# Patient Record
Sex: Female | Born: 1950 | Race: White | Hispanic: No | Smoking: Former smoker
Health system: Southern US, Community
[De-identification: ages and names within clinical notes are randomized; demographics above are authoritative.]

## PROBLEM LIST (undated history)

## (undated) DIAGNOSIS — Z92241 Personal history of systemic steroid therapy: Secondary | ICD-10-CM

## (undated) DIAGNOSIS — I219 Acute myocardial infarction, unspecified: Secondary | ICD-10-CM

## (undated) DIAGNOSIS — F329 Major depressive disorder, single episode, unspecified: Secondary | ICD-10-CM

## (undated) DIAGNOSIS — F4310 Post-traumatic stress disorder, unspecified: Secondary | ICD-10-CM

## (undated) DIAGNOSIS — M4800 Spinal stenosis, site unspecified: Secondary | ICD-10-CM

## (undated) DIAGNOSIS — G8929 Other chronic pain: Secondary | ICD-10-CM

## (undated) DIAGNOSIS — N950 Postmenopausal bleeding: Secondary | ICD-10-CM

## (undated) DIAGNOSIS — E785 Hyperlipidemia, unspecified: Secondary | ICD-10-CM

## (undated) DIAGNOSIS — G4700 Insomnia, unspecified: Secondary | ICD-10-CM

## (undated) DIAGNOSIS — F419 Anxiety disorder, unspecified: Secondary | ICD-10-CM

## (undated) DIAGNOSIS — M8580 Other specified disorders of bone density and structure, unspecified site: Secondary | ICD-10-CM

## (undated) DIAGNOSIS — R112 Nausea with vomiting, unspecified: Secondary | ICD-10-CM

## (undated) DIAGNOSIS — M1990 Unspecified osteoarthritis, unspecified site: Secondary | ICD-10-CM

## (undated) DIAGNOSIS — M549 Dorsalgia, unspecified: Secondary | ICD-10-CM

## (undated) DIAGNOSIS — K219 Gastro-esophageal reflux disease without esophagitis: Secondary | ICD-10-CM

## (undated) DIAGNOSIS — M797 Fibromyalgia: Secondary | ICD-10-CM

## (undated) DIAGNOSIS — R569 Unspecified convulsions: Secondary | ICD-10-CM

## (undated) DIAGNOSIS — Z9889 Other specified postprocedural states: Secondary | ICD-10-CM

## (undated) DIAGNOSIS — I639 Cerebral infarction, unspecified: Secondary | ICD-10-CM

## (undated) DIAGNOSIS — Z8673 Personal history of transient ischemic attack (TIA), and cerebral infarction without residual deficits: Secondary | ICD-10-CM

## (undated) DIAGNOSIS — F10929 Alcohol use, unspecified with intoxication, unspecified: Secondary | ICD-10-CM

## (undated) DIAGNOSIS — F32A Depression, unspecified: Secondary | ICD-10-CM

## (undated) DIAGNOSIS — G43909 Migraine, unspecified, not intractable, without status migrainosus: Secondary | ICD-10-CM

## (undated) DIAGNOSIS — IMO0002 Reserved for concepts with insufficient information to code with codable children: Secondary | ICD-10-CM

## (undated) DIAGNOSIS — K7689 Other specified diseases of liver: Secondary | ICD-10-CM

## (undated) DIAGNOSIS — L0292 Furuncle, unspecified: Secondary | ICD-10-CM

## (undated) DIAGNOSIS — I10 Essential (primary) hypertension: Secondary | ICD-10-CM

## (undated) DIAGNOSIS — J189 Pneumonia, unspecified organism: Secondary | ICD-10-CM

## (undated) DIAGNOSIS — R9439 Abnormal result of other cardiovascular function study: Secondary | ICD-10-CM

## (undated) DIAGNOSIS — R252 Cramp and spasm: Secondary | ICD-10-CM

## (undated) DIAGNOSIS — J020 Streptococcal pharyngitis: Secondary | ICD-10-CM

## (undated) HISTORY — DX: Abnormal result of other cardiovascular function study: R94.39

## (undated) HISTORY — DX: Pneumonia, unspecified organism: J18.9

## (undated) HISTORY — DX: Anxiety disorder, unspecified: F41.9

## (undated) HISTORY — DX: Reserved for concepts with insufficient information to code with codable children: IMO0002

## (undated) HISTORY — PX: HAND SURGERY: SHX662

## (undated) HISTORY — DX: Hyperlipidemia, unspecified: E78.5

## (undated) HISTORY — DX: Alcohol use, unspecified with intoxication, unspecified: F10.929

## (undated) HISTORY — PX: FACIAL COSMETIC SURGERY: SHX629

## (undated) HISTORY — DX: Other specified diseases of liver: K76.89

## (undated) HISTORY — PX: WISDOM TOOTH EXTRACTION: SHX21

## (undated) HISTORY — PX: LIPOSUCTION: SHX10

## (undated) HISTORY — DX: Acute myocardial infarction, unspecified: I21.9

## (undated) HISTORY — PX: BREAST ENHANCEMENT SURGERY: SHX7

## (undated) HISTORY — DX: Other specified disorders of bone density and structure, unspecified site: M85.80

## (undated) HISTORY — PX: SQUAMOUS CELL CARCINOMA EXCISION: SHX2433

## (undated) HISTORY — DX: Cerebral infarction, unspecified: I63.9

## (undated) HISTORY — DX: Migraine, unspecified, not intractable, without status migrainosus: G43.909

## (undated) HISTORY — DX: Spinal stenosis, site unspecified: M48.00

## (undated) HISTORY — DX: Postmenopausal bleeding: N95.0

## (undated) HISTORY — PX: COLONOSCOPY: SHX174

## (undated) HISTORY — DX: Personal history of transient ischemic attack (TIA), and cerebral infarction without residual deficits: Z86.73

---

## 1998-06-08 ENCOUNTER — Other Ambulatory Visit: Admission: RE | Admit: 1998-06-08 | Discharge: 1998-06-08 | Payer: Self-pay | Admitting: Oral Surgery

## 1999-11-18 ENCOUNTER — Encounter: Payer: Self-pay | Admitting: Family Medicine

## 1999-11-18 ENCOUNTER — Ambulatory Visit (HOSPITAL_COMMUNITY): Admission: RE | Admit: 1999-11-18 | Discharge: 1999-11-18 | Payer: Self-pay | Admitting: Family Medicine

## 2003-02-12 ENCOUNTER — Other Ambulatory Visit: Admission: RE | Admit: 2003-02-12 | Discharge: 2003-02-12 | Payer: Self-pay | Admitting: Family Medicine

## 2010-08-02 ENCOUNTER — Inpatient Hospital Stay (HOSPITAL_COMMUNITY): Payer: BC Managed Care – PPO

## 2010-08-02 ENCOUNTER — Observation Stay (HOSPITAL_COMMUNITY)
Admission: EM | Admit: 2010-08-02 | Discharge: 2010-08-04 | DRG: 832 | Disposition: A | Discharge status: Home / Self Care | Payer: BC Managed Care – PPO | Attending: Internal Medicine | Admitting: Internal Medicine

## 2010-08-02 ENCOUNTER — Emergency Department (HOSPITAL_COMMUNITY): Payer: BC Managed Care – PPO

## 2010-08-02 DIAGNOSIS — R29898 Other symptoms and signs involving the musculoskeletal system: Secondary | ICD-10-CM | POA: Insufficient documentation

## 2010-08-02 DIAGNOSIS — Z79899 Other long term (current) drug therapy: Secondary | ICD-10-CM | POA: Insufficient documentation

## 2010-08-02 DIAGNOSIS — R209 Unspecified disturbances of skin sensation: Secondary | ICD-10-CM | POA: Insufficient documentation

## 2010-08-02 DIAGNOSIS — R5381 Other malaise: Secondary | ICD-10-CM | POA: Insufficient documentation

## 2010-08-02 DIAGNOSIS — E785 Hyperlipidemia, unspecified: Secondary | ICD-10-CM | POA: Insufficient documentation

## 2010-08-02 DIAGNOSIS — R110 Nausea: Secondary | ICD-10-CM | POA: Insufficient documentation

## 2010-08-02 DIAGNOSIS — F17200 Nicotine dependence, unspecified, uncomplicated: Secondary | ICD-10-CM | POA: Insufficient documentation

## 2010-08-02 DIAGNOSIS — I10 Essential (primary) hypertension: Secondary | ICD-10-CM | POA: Insufficient documentation

## 2010-08-02 DIAGNOSIS — G459 Transient cerebral ischemic attack, unspecified: Principal | ICD-10-CM | POA: Insufficient documentation

## 2010-08-02 LAB — URINALYSIS, ROUTINE W REFLEX MICROSCOPIC
Bilirubin Urine: NEGATIVE
Glucose, UA: NEGATIVE mg/dL
Hgb urine dipstick: NEGATIVE
Ketones, ur: NEGATIVE mg/dL
Leukocytes, UA: NEGATIVE
Nitrite: NEGATIVE
Protein, ur: NEGATIVE mg/dL
Specific Gravity, Urine: 1.015 (ref 1.005–1.030)
Urobilinogen, UA: 0.2 mg/dL (ref 0.0–1.0)
pH: 6.5 (ref 5.0–8.0)

## 2010-08-02 LAB — APTT: aPTT: 30 seconds (ref 24–37)

## 2010-08-02 LAB — CBC
HCT: 38.2 % (ref 36.0–46.0)
MCH: 33 pg (ref 26.0–34.0)
MCHC: 35.2 g/dL (ref 30.0–36.0)
MCHC: 35.1 g/dL (ref 30.0–36.0)
MCV: 93.2 fL (ref 78.0–100.0)
Platelets: 246 10*3/uL (ref 150–400)
Platelets: 264 10*3/uL (ref 150–400)
RBC: 4.37 MIL/uL (ref 3.87–5.11)
RBC: 4.39 MIL/uL (ref 3.87–5.11)
RDW: 12.6 % (ref 11.5–15.5)
RDW: 12.5 % (ref 11.5–15.5)
WBC: 12.3 10*3/uL — ABNORMAL HIGH (ref 4.0–10.5)
WBC: 12.9 10*3/uL — ABNORMAL HIGH (ref 4.0–10.5)

## 2010-08-02 LAB — POCT I-STAT, CHEM 8
BUN: 15 mg/dL (ref 6–23)
Calcium, Ion: 1.2 mmol/L (ref 1.12–1.32)
Hemoglobin: 15.3 g/dL — ABNORMAL HIGH (ref 12.0–15.0)
TCO2: 25 mmol/L (ref 0–100)

## 2010-08-02 LAB — COMPREHENSIVE METABOLIC PANEL
ALT: 17 U/L (ref 0–35)
Alkaline Phosphatase: 57 U/L (ref 39–117)
CO2: 24 mEq/L (ref 19–32)
GFR calc Af Amer: >60 mL/min (ref >60)
GFR calc non Af Amer: >60 mL/min (ref >60)
Glucose, Bld: 93 mg/dL (ref 70–99)
Potassium: 4.2 mEq/L (ref 3.5–5.1)
Sodium: 136 mEq/L (ref 135–145)
Total Bilirubin: 0.2 mg/dL — ABNORMAL LOW (ref 0.3–1.2)

## 2010-08-02 LAB — DIFFERENTIAL
Basophils Absolute: 0.1 10*3/uL (ref 0.0–0.1)
Basophils Absolute: 0.1 10*3/uL (ref 0.0–0.1)
Basophils Relative: 1 % (ref 0–1)
Basophils Relative: 1 % (ref 0–1)
Eosinophils Absolute: 0.1 10*3/uL (ref 0.0–0.7)
Eosinophils Absolute: 0.2 10*3/uL (ref 0.0–0.7)
Eosinophils Relative: 1 % (ref 0–5)
Eosinophils Relative: 1 % (ref 0–5)
Lymphocytes Relative: 23 % (ref 12–46)
Lymphocytes Relative: 31 % (ref 12–46)
Lymphs Abs: 2.8 10*3/uL (ref 0.7–4.0)
Lymphs Abs: 4 10*3/uL (ref 0.7–4.0)
Monocytes Absolute: 0.8 10*3/uL (ref 0.1–1.0)
Monocytes Absolute: 0.8 10*3/uL (ref 0.1–1.0)
Monocytes Absolute: 0.7 10*3/uL (ref 0.1–1.0)
Monocytes Relative: 6 % (ref 3–12)
Monocytes Relative: 7 % (ref 3–12)
Neutro Abs: 8.4 10*3/uL — ABNORMAL HIGH (ref 1.7–7.7)
Neutrophils Relative %: 69 % (ref 43–77)
Neutrophils Relative %: 69 % (ref 43–77)

## 2010-08-02 LAB — GLUCOSE, CAPILLARY: Glucose-Capillary: 95 mg/dL (ref 70–99)

## 2010-08-02 LAB — BASIC METABOLIC PANEL
CO2: 26 mEq/L (ref 19–32)
Calcium: 9 mg/dL (ref 8.4–10.5)
Creatinine, Ser: 0.68 mg/dL (ref 0.50–1.10)
GFR calc Af Amer: >60 mL/min (ref >60)
GFR calc non Af Amer: >60 mL/min (ref >60)
Sodium: 136 mEq/L (ref 135–145)

## 2010-08-02 LAB — PROTIME-INR
INR: 0.91 (ref 0.00–1.49)
Prothrombin Time: 12.4 seconds (ref 11.6–15.2)
Prothrombin Time: 12.4 seconds (ref 11.6–15.2)

## 2010-08-03 ENCOUNTER — Inpatient Hospital Stay (HOSPITAL_COMMUNITY): Payer: BC Managed Care – PPO

## 2010-08-03 LAB — COMPREHENSIVE METABOLIC PANEL
ALT: 15 U/L (ref 0–35)
CO2: 25 mEq/L (ref 19–32)
Calcium: 8.8 mg/dL (ref 8.4–10.5)
GFR calc Af Amer: >60 mL/min (ref >60)
GFR calc non Af Amer: >60 mL/min (ref >60)
Glucose, Bld: 91 mg/dL (ref 70–99)
Sodium: 138 mEq/L (ref 135–145)

## 2010-08-03 LAB — LIPID PANEL: HDL: 51 mg/dL (ref >39)

## 2010-08-03 LAB — TSH: TSH: 6.409 u[IU]/mL — ABNORMAL HIGH (ref 0.350–4.500)

## 2010-08-03 NOTE — Consult Note (Signed)
NAME:  Vicki [name], Vicki [name] NO.:  [id number]  MEDICAL RECORD NO.:  [id number]  LOCATION:  3035                         FACILITY:  [facility]  PHYSICIAN:  Levie Heritage, MD       DATE OF BIRTH:  27-Feb-1950  DATE OF CONSULTATION:  08/02/2010 DATE OF DISCHARGE:                                CONSULTATION   REFERRING PHYSICIAN:  ER team.  REASON FOR CONSULTATION:  Code stroke.  CHIEF COMPLAINT:  Right arm and leg weakness and paresthesias.  HISTORY OF PRESENT ILLNESS:  This 60 year old woman with no other health issues had sudden onset of right arm and leg weakness along with feeling heavy sensation on the face as well as on the right arm.  She came to emergency department and a CT scan of the head was performed that did not show any acute intracranial abnormality.  By the time I examined her, majority of the signs and symptoms had been resolved and the NIH stroke scale was 1 for very mild paresthesias involving the right hand only.  No TPA was given as the signs and symptoms were resolved almost completely and the risks outweigh any benefits.  PAST MEDICAL HISTORY:  Hypertension.  ALLERGIES:  No known drug allergies.  MEDICATIONS:  She takes lisinopril 10 mg daily.  FAMILY HISTORY:  Father died of stroke.  No history to suggest early vasculopathy in the family.  SOCIAL HISTORY:  He works as a Paediatric nurse.  Denies alcohol abuse or illicit drug abuse.  REVIEW OF CLINICAL DATA:  I have seen her CT scan of the head performed today and it is completely unremarkable for any acute intracranial abnormality.  I have seen her labs and noted mild increase in WBC 12.4 with otherwise normal stroke panel, unremarkable Chem-8, BMP, and normal INR and unremarkable urinalysis.  PHYSICAL EXAMINATION:  VITAL SIGNS:  Blood pressure of 128/68 mmHg, pulse of 73 per minute. GENERAL:  Currently comfortably lying down in the bed.  NIHSS stroke scale is 1 for the mild right hand tingling.   Awake, oriented x3 in no acute distress. NEUROLOGIC:  Bilateral pupils reactive to light and accommodation. Moves eyes to all direction.  No field cut noted.  Face is symmetrical for sensation and strength testing.  Midline tongue without atrophy or fasciculation.  Palate elevates symmetrically bilaterally.  Shoulder shrug strength is 5/5.  Motor examination reveals no strength deficit. She is 5/5 all over on the strength testing.  Sensory examination suggests mild altered sensation involving the patchy areas use of the right face and right hand. Gait was deferred.  IMPRESSION:  A 60 year old woman with no other health issues with no significant comorbidities has sudden transient right arm and leg weakness along with paresthesias and majority of the symptoms has resolved already.   My impression is that her symptomatology is the result of central nervous system ischemic event.  More likely transient ischemic attack than a stroke, however, the complete possibility of a small stroke cannot be ruled out on the clinical examination alone. No t-PA was given because the symptoms are almost already resolved.  PLAN: 1. Please give the patient stat aspirin and keep on aspirin once  daily. 2. MRI and MRA of the head, Doppler carotid, cardiac echo, fasting     lipid panels. 3. Neurology will follow up with the patient and will proceed as the     case progresses.          ______________________________ Levie Heritage, MD     WS/MEDQ  D:  08/02/2010  T:  08/03/2010  Job:  [id number]  Electronically Signed by Levie Heritage MD on 08/03/2010 07:46:36 AM

## 2010-08-11 NOTE — Discharge Summary (Signed)
NAME:  Vicki [name], Vicki [name] NO.:  [id number]  MEDICAL RECORD NO.:  [id number]  LOCATION:  3035                         FACILITY:  [facility]  PHYSICIAN:  Jeoffrey Massed, MD    DATE OF BIRTH:  04-17-1950  DATE OF ADMISSION:  08/02/2010 DATE OF DISCHARGE:  08/04/2010                        DISCHARGE SUMMARY - REFERRING   PRIMARY CARE PRACTITIONER:  Dr. Merri Brunette.  PRIMARY DISCHARGE DIAGNOSES: 1. Transient ischemic attack. 2. Tobacco abuse. 3. Dyslipidemia. 4. Probable anxiety.  PAST MEDICAL HISTORY/SECONDARY DISCHARGE DIAGNOSES:  Include the following: 1. Hypertension. 2. Tobacco abuse.  CONSULTATIONS:  Neurology.  DISCHARGE MEDICATIONS: 1. Xanax 0.25 mg 1 tablet p.o. every 8 hours p.r.n. for anxiety. 2. Aspirin 325 mg 1 tablet p.o. daily. 3. Placebo or clopidogrel 75 mg p.o. daily with a meal as part of the     of the point study. 4. Simvastatin 20 mg 1 tablet p.o. daily. 5. Lisinopril 10 mg 1 tablet p.o. daily.  BRIEF HISTORY OF PRESENT ILLNESS:  The patient is a very pleasant 60- year-old female who presented with sudden onset of weakness and numbness in the right lower extremity right upper and lower extremity.  She was then admitted to the hospitalist service for further evaluation and treatment.  For further details, please see the physical history and physical that was dictated by Dr. Gerri Lins on admission.  PERTINENT RADIOLOGICAL STUDIES: 1. CT of the head done on August 02, 2010, was negative for any acute     intracranial abnormality. 2. MRI of the brain showed no definite pathologic findings.  I     questioned if there is a punctate acute or subacute infarction in     the inferior pons in the left.  This is so small it is difficult to     diagnose with certainty. 3. MRA of the head showed normal intracranial MR angiography of large     and medium-sized vessels. 4. The 2-D echocardiogram showed EF around 60% to 65%.  There was     increased  thickness of the atrial septum consistent with     lymphomatous hypertrophy. 5. Transcranial Doppler showed normal mean-flow velocities in the     anterior and posterior circulation. 6. Carotid Dopplers showed no significant carotid artery stenosis.  PERTINENT LABORATORY DATA: 1. LDL cholesterol was 141. 2. TSH was 6.409. 3. HbA1c was 5.8.  BRIEF HOSPITAL COURSE: 1. Probable TIA.  The patient presented with transient onset of     weakness in her right upper extremity and lower extremity.  Given     her risk profile, the patient was admitted to the hospitalist     service, underwent a TIA workup which included a MRI, MRA of her     brain including as well as a 2-D echocardiogram and carotid Doppler     ultrasound all of which are detailed as above.  Neurology followed     the patient and subsequently the patient consented to being     participant in the point trial.  The patient has been placed on     aspirin along with either a Plavix or placebo as she is in the     point trial.  She has also been placed on the statin and is now     stable to be discharged home.  She will follow up with Dr. Pearlean Brownie in     the next 1or 2 months and has been instructed to follow up with her     primary care practitioner in the next 1-2 weeks.  Please note that     weakness was transient and on discharge she does not have any focal     neurological deficits at all. 2. Dyslipidemia.  The patient has been started on simvastatin. 3. Probable subclinical hypothyroidism.  The patient will need further     workup but this can be done as an outpatient and we will defer this     to her primary care practitioner. 4. Tobacco abuse.  The patient has been counseled extensively by me     regarding risk for further continued tobacco abuse including     coronary artery disease, CVAs, lung cancer.  She understands and     claims she will quit from now on.  DISPOSITION:  The patient was considered stable to be  discharged home.  FOLLOWUP INSTRUCTIONS: 1. The patient will follow up with her primary care practitioner     within 1-2 weeks. 2. The patient will follow up Dr. Pearlean Brownie in the next 4-6 weeks.  Total time spent 25 minutes.     Jeoffrey Massed, MD     SG/MEDQ  D:  08/04/2010  T:  08/04/2010  Job:  [id number]  cc:   Dario Guardian, M.D. Pramod P. Pearlean Brownie, MD  Electronically Signed by Jeoffrey Massed  on 08/11/2010 12:11:45 PM

## 2010-10-05 NOTE — H&P (Signed)
NAME:  Vicki, [name] NO.:  [id number]  MEDICAL RECORD NO.:  [id number]  LOCATION:  3035                         FACILITY:  [facility]  PHYSICIAN:  Vicki Nolton, DO         DATE OF BIRTH:  [birth year]  DATE OF ADMISSION:  08/02/2010 DATE OF DISCHARGE:                             HISTORY & PHYSICAL   CHIEF COMPLAINT:  Right-sided weakness.  HISTORY OF PRESENT ILLNESS:  The patient is a 60 year old female who states that she was trying to cut her this morning when she had sudden onset of weakness and numbness in her right upper extremity and her right lower extremity.  She was holding a water bowl on her right hand and dropped it is and her right leg gave out on her.  She also had complete numbness of the right upper extremity on the right side of her face.  At this time, she denies any headache, confusion, dysarthria, aphasia, expressive or receptive or dizziness. She states that now 4 hours later her symptoms have almost completely resolved.  She is feeling much better.  PAST MEDICAL HISTORY:  Significant for hypertension and tobacco abuse.  PAST SURGICAL HISTORY:  None.  MEDICATIONS:  Includes: 1. Lisinopril, dose and frequency unknown. 2. Progesterone, dose and frequency unknown.  SOCIAL HISTORY:  The patient smokes about a five cigarettes a day.  She has been trying to quit; however, she does carry about a 45-year-pack history.  Occasional alcohol, no recreational drug use.  FAMILY HISTORY:  Strongly positive for stroke, in fact her father died of a stroke.  He had multiple TIAs and then a large catastrophic strokes.  REVIEW OF SYSTEMS:  CONSTITUTIONAL:  Negative for fever.  Negative for chills.  Negative for weakness.  Negative for fatigue.  CNS:  Positive for right upper and lower extremity weakness and facial numbness.  No syncope, no seizure, no headaches.  ENT:  No nasal congestion.  No throat pain.  No coryza.  CARDIOVASCULAR:  No chest pain,  no palpitations, no orthopnea.  RESPIRATORY:  No cough, no shortness breath, no wheezing.  GASTROINTESTINAL: Positive for nausea, which she states has been pretty much constant since January.  At this point, that she has gone up and purchase motion sickness pills for herself; however, she has not lost any weight.  GENITOURINARY:  No dysuria.  No hematuria. No urinary frequency.  RENAL:  No flank pain.  No swelling.  No pruritus.  SKIN:  No rashes or sores or lesions.  HEMATOLOGICAL:  No easy bruising, no purpura, no clots.  LYMPH:  No lymphadenopathy.  No painful lymph nodes or no specific lymph swelling.  PSYCHIATRIC:  No anxiety.  No depression.  No insomnia.  PHYSICAL EXAMINATION:  VITAL SIGNS:  O2 sat is 97 on 2 L, heart rate 66, blood pressure 118/75, respiratory rate 18. GENERAL:  The patient is awake, alert, and oriented x3, in no acute distress. HEENT:  Eyes:  Pupils equal, round, and react to light and accommodation.  External ocular movement bilaterally intact.  Sclerae nonicteric and noninjected.  Mouth:  Oral mucosa dry.  No lesions.  No sores.  Pharynx clear.  No erythema.  No exudate. NECK:  Negative for JVD.  Negative for thyromegaly.  Negative for lymphadenopathy. HEART:  Regular rate and rhythm at 80 beats per minute without murmur, ectopy, or gallops.  No lateral PMI.  No thrills. LUNGS:  Clear to auscultation bilaterally without wheezes, rales, or rhonchi.  No increased work of breathing.  No tactile fremitus. ABDOMEN:  Soft, nontender, nondistended.  Positive bowel sounds.  No hepatosplenomegaly.  No hernia is palpated. CARDIOVASCULAR:  Extremities are negative for cyanosis, clubbing, or edema.  The patient has greatly diminished dorsalis pedis and popliteal pulses bilaterally.  No carotid bruits bilaterally. NEUROLOGICAL:  Cranial nerves II through XII gross intact.  Motor and sensory intact.  Proprioception is intact. The patient has 3/5 muscle strength in her  right upper extremity, 5/5 in her left upper extremity, 4/5 in her left lower extremity, and 4/5 in her right lower extremity, and 5/5 in her left lower extremity.  LABORATORY DATA:  CT head shows no evidence of acute intracranial abnormality.  Chest x-ray demonstrated no acute cardiopulmonary abnormality.  Ionized calcium 1.20.  WBC 12.4, hemoglobin 53, hematocrit 45.0, platelets 246, neutrophils 69, PT 12.4, INR is 0.91, PTT 29. Glucose 95, sodium 138, potassium 4.7, chloride 105, CO2 24, BUN 15, creatinine 0.7, T-bili 0.2, alk phos 57, AST 18, ALT 17, total protein 6.5, albumin 3.5, calcium 8.8, CK is 43, MB is 1.5, troponin-I is less than 0.30.  EKG demonstrates normal sinus rhythm.  ASSESSMENT: 1. Transient ischemic attack with GSC of 1. 2. Hypertension. 3. Tobacco abuse. 4. Mild dehydration. 5. Use of exogenous hormones.  PLAN: 1. Admit to telemetry. 2. TIA workup including carotid Dopplers, echocardiogram, and followup     CT or MRI of the brain.  She will receive an aspirin every day.     She will be given DVT prophylaxis and GI prophylaxis. When we can     get confirmation on the correct dose of lisinopril.  We will     continue that.  We will stop the patient's progesterone.  I have     already counseled her for 7 minutes on tobacco cessation and it is     important to her future help well being.  I spent 50 minutes on this admission.          ______________________________ Vicki Lowes, DO     AS/MEDQ  D:  08/02/2010  T:  08/03/2010  Job:  [id number]  cc:   Dario Guardian, M.D.  Electronically Signed by Vicki Lowes DO on 10/05/2010 01:47:29 PM

## 2011-05-04 LAB — HM PAP SMEAR: HM Pap smear: NEGATIVE

## 2012-05-08 ENCOUNTER — Encounter: Payer: Self-pay | Admitting: Certified Nurse Midwife

## 2012-05-09 ENCOUNTER — Ambulatory Visit (INDEPENDENT_AMBULATORY_CARE_PROVIDER_SITE_OTHER): Payer: BC Managed Care – PPO | Admitting: Certified Nurse Midwife

## 2012-05-09 ENCOUNTER — Encounter: Payer: Self-pay | Admitting: Certified Nurse Midwife

## 2012-05-09 VITALS — BP 132/66 | HR 68 | Resp 16 | Ht 62.0 in | Wt 175.8 lb

## 2012-05-09 DIAGNOSIS — N950 Postmenopausal bleeding: Secondary | ICD-10-CM

## 2012-05-09 DIAGNOSIS — Z0000 Encounter for general adult medical examination without abnormal findings: Secondary | ICD-10-CM

## 2012-05-09 DIAGNOSIS — Z01419 Encounter for gynecological examination (general) (routine) without abnormal findings: Secondary | ICD-10-CM

## 2012-05-09 DIAGNOSIS — N907 Vulvar cyst: Secondary | ICD-10-CM

## 2012-05-09 DIAGNOSIS — N9089 Other specified noninflammatory disorders of vulva and perineum: Secondary | ICD-10-CM

## 2012-05-09 LAB — TSH: TSH: 4.175 u[IU]/mL (ref 0.350–4.500)

## 2012-05-09 LAB — POCT URINALYSIS DIPSTICK
Blood, UA: NEGATIVE
Leukocytes, UA: NEGATIVE
Nitrite, UA: NEGATIVE
Protein, UA: NEGATIVE
Urobilinogen, UA: NEGATIVE
pH, UA: 5

## 2012-05-09 NOTE — Patient Instructions (Signed)

## 2012-05-09 NOTE — Progress Notes (Signed)
62 y.o. [id number] Married Caucasian Female here for annual exam.  Menopausal stopped Prometrium 8/13, due to expense. Reports no vaginal bleeding until 2-14 which lasted 3 days.  No bleeding since that time. Denies vaginal dryness. Noted small bump on right vulva area,2 weeks ago, used epsom salt tub baths and area drained, now resolving.  No fever or chills or redness area when occurred.  Would information on Internal Medicine group, she is seeking new M.D. For her hypertension management and health care.  No other health issues.  No LMP recorded.          Sexually active: no  The current method of family planning is status post hysterectomy and post menopausal status.    Exercising: yes  Home exercise routine includes walking 2 hrs per week. Smoker:  no  Health Maintenance: Pap:  05/04/2011 MMG:  05/18/2011  Negative  Colonoscopy:  never BMD:   18 years ago  TDaP:   Labs:    reports that she has quit smoking. She does not have any smokeless tobacco history on file.  Past Medical History  Diagnosis Date  . Migraines     with aura  . 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 Hyperlipidemia     Past Surgical History  Procedure Laterality Date  . Hand surgery      nerve & tendon repair  . Breast enhancement surgery    . Facial cosmetic surgery      Current Outpatient Prescriptions  Medication Sig Dispense Refill  . aspirin 325 MG tablet Take 325 mg by mouth daily.      . B Complex Vitamins (B COMPLEX PO) Take by mouth.      . Cholecalciferol (VITAMIN D PO) Take by mouth daily.      . Cyanocobalamin (VITAMIN B 12 PO) Take by mouth daily.      Marland Kitchen losartan (COZAAR) 100 MG tablet Take 100 mg by mouth daily.      Marland Kitchen MAGNESIUM PO Take by mouth daily.      . Multiple Vitamins-Minerals (MULTIVITAMIN PO) Take by mouth daily.      . progesterone (PROMETRIUM) 200 MG capsule Take 200 mg by mouth daily.       No current facility-administered medications for this visit.    Family History   Problem Relation Age of Onset  . Stroke Mother   . Stroke Father   . Depression Father   . Hypertension Brother   . Heart disease Brother     stint    ROS:  Pertinent items are noted in HPI.  Otherwise, a comprehensive ROS was negative.  Exam:   There were no vitals taken for this visit.  Weight change: @WEIGHTCHANGE @ Height:      Ht Readings from Last 3 Encounters:  No data found for Ht    General appearance: alert, cooperative and appears stated age Head: Normocephalic, without obvious abnormality, atraumatic Neck: no adenopathy, supple, symmetrical, trachea midline and thyroid normal to inspection and palpation Lungs: clear to auscultation bilaterally Breasts: normal appearance, no masses or tenderness, No nipple discharge or bleeding Heart: regular rate and rhythm Abdomen: soft, non-tender; bowel sounds normal; no masses,  no organomegaly Extremities: extremities normal, atraumatic, no cyanosis or edema Skin: Skin color, texture, turgor normal. No rashes or lesions Lymph nodes: Cervical, supraclavicular, and axillary nodes normal. No abnormal inguinal nodes palpated Neurologic: Grossly normal   Pelvic: External genitalia:  no lesions, vulva cyst resolving, small soft area  on right lower vulva, non tender              Urethra:  normal appearing urethra with no masses, tenderness or lesions              Bartholins and Skenes: normal                 Vagina: normal appearing vagina with normal color and discharge, no lesions              Cervix: no lesions normal appearance              Pap taken: yes Bimanual Exam:  Uterus:  normal size, contour, position, consistency, mobility, non-tender              Adnexa: normal adnexa and no mass, fullness, tenderness               Rectovaginal: Confirms               Anus:  normal sphincter tone, no lesions  A: Well Woman with normal exam Menopausal no HRT Post menopausal bleeding Right vulvar cyst resolving Hypertension stable  medication seeking new provider, given referral list  P:Reviewed health and wellness pertinent to exam  mammogram yearly pap smear as indicated Discussed concern with Post menopausal bleeding and need for evaluation. Will consult and advise. Reviewed findings, continue epsom salt soaks until resolved, warning signs and symptoms given Stressed importance of keeping hypertension under control with history of mini stroke. Patient aware.  return annually or prn as above  An After Visit Summary was printed and given to the patient.  Pt to be notified re PUS and EMB Reviewed, TL

## 2012-05-10 LAB — IPS PAP TEST WITH REFLEX TO HPV

## 2012-06-27 ENCOUNTER — Telehealth: Payer: Self-pay | Admitting: Certified Nurse Midwife

## 2012-06-27 NOTE — Telephone Encounter (Signed)
Unable to leave a message due to no voicemail being set up. Called to try to schedule PUS.

## 2013-01-24 ENCOUNTER — Other Ambulatory Visit: Payer: Self-pay | Admitting: *Deleted

## 2013-01-28 ENCOUNTER — Other Ambulatory Visit: Payer: Self-pay | Admitting: Plastic Surgery

## 2013-04-04 ENCOUNTER — Ambulatory Visit (INDEPENDENT_AMBULATORY_CARE_PROVIDER_SITE_OTHER): Payer: BC Managed Care – PPO | Admitting: Pharmacist

## 2013-04-04 ENCOUNTER — Ambulatory Visit (INDEPENDENT_AMBULATORY_CARE_PROVIDER_SITE_OTHER): Payer: BC Managed Care – PPO | Admitting: Interventional Cardiology

## 2013-04-04 ENCOUNTER — Encounter: Payer: Self-pay | Admitting: Interventional Cardiology

## 2013-04-04 ENCOUNTER — Institutional Professional Consult (permissible substitution): Payer: BC Managed Care – PPO | Admitting: Pharmacist

## 2013-04-04 VITALS — BP 128/86 | HR 70

## 2013-04-04 VITALS — BP 110/80 | HR 76 | Ht 62.0 in | Wt 170.0 lb

## 2013-04-04 DIAGNOSIS — Z79899 Other long term (current) drug therapy: Secondary | ICD-10-CM

## 2013-04-04 DIAGNOSIS — E785 Hyperlipidemia, unspecified: Secondary | ICD-10-CM

## 2013-04-04 DIAGNOSIS — R079 Chest pain, unspecified: Secondary | ICD-10-CM

## 2013-04-04 MED ORDER — PRAVASTATIN SODIUM 10 MG PO TABS: 10.0000 mg | ORAL_TABLET | Freq: Every day | ORAL | Status: DC

## 2013-04-04 NOTE — Progress Notes (Signed)
Patient ID: Vicki [name], female   DOB: [birth year], 63 y.o.   MRN: [id number]     Patient ID: Vicki [name] MRN: [id number] DOB/AGE: 1950-05-15 63 y.o.   Referring Physician Dr. Carol Ada   Reason for Consultation hyperlipidemia  HPI: 63 y/o who has had hyperlipidemia.  She was referred to the lipid clinic.  Upon further questioning, she has had a squeezing feeling that is worse with exertion.  She uses a treadclimber and this comes on consistently after 5 minutes.  It is relieved with rest.  It is not severe.  She does not always stop exercising.  She feels some SHOB at times as well.  No palpitations or syncope.     Current Outpatient Prescriptions  Medication Sig Dispense Refill  . acamprosate (CAMPRAL) 333 MG tablet Take 333 mg by mouth 3 (three) times daily with meals.      Marland Kitchen aspirin 325 MG tablet Take 325 mg by mouth daily.      . Cholecalciferol (VITAMIN D PO) Take 5,000 mg by mouth 2 (two) times daily.       Javier Docker Oil 300 MG CAPS Take by mouth.      . losartan (COZAAR) 100 MG tablet Take 20 mg by mouth daily.       Marland Kitchen MAGNESIUM PO Take 133 mg by mouth daily.       . milk thistle 175 MG tablet Take 175 mg by mouth daily. 250mg       . Omega-3 Fatty Acids (FISH OIL) 1000 MG CAPS Take 1,000 mg by mouth daily.      . Omega-3 Fatty Acids (OMEGA 3 PO) Take by mouth. 90 mg      . selenium 50 MCG TABS tablet Take 50 mcg by mouth daily. 200 mg      . pravastatin (PRAVACHOL) 10 MG tablet Take 1 tablet (10 mg total) by mouth daily.  30 tablet  5   No current facility-administered medications for this visit.   Past Medical History  Diagnosis Date  . Migraines     with aura  . 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 Hyperlipidemia     Family History  Problem Relation Age of Onset  . Stroke Mother   . Stroke Father   . Depression Father   . Hypertension Brother   . Heart disease Brother     stint    History   Social History  . Marital Status: Married   Spouse Name: N/A    Number of Children: N/A  . Years of Education: N/A   Occupational History  . Not on file.   Social History Main Topics  . Smoking status: Former Research scientist (life sciences)  . Smokeless tobacco: Not on file  . Alcohol Use: No  . Drug Use: No  . Sexual Activity: Yes    Birth Control/ Protection: Post-menopausal   Other Topics Concern  . Not on file   Social History Narrative  . No narrative on file    Past Surgical History  Procedure Laterality Date  . Hand surgery      nerve & tendon repair  . Breast enhancement surgery    . Facial cosmetic surgery        (Not in a hospital admission)  Review of systems complete and found to be negative unless listed above .  No nausea, vomiting.  No fever chills, No focal weakness,  No palpitations.  Physical Exam: Filed Vitals:  04/04/13 1117  BP: 110/80  Pulse: 76    Weight: 170 lb (77.111 kg)  Physical exam:  [place]/AT EOMI No JVD, No carotid bruit RRR S1S2  No wheezing Soft. NT, nondistended No edema. No focal motor or sensory deficits Normal affect  Labs:   Lab Results  Component Value Date   WBC 12.9* 08/02/2010   HGB 13.4 08/02/2010   HCT 38.2 08/02/2010   MCV 93.2 08/02/2010   PLT 244 08/02/2010   No results found for this basename: NA, K, CL, CO2, BUN, CREATININE, CALCIUM, LABALBU, PROT, BILITOT, ALKPHOS, ALT, AST, GLUCOSE,  in the last 168 hours Lab Results  Component Value Date   CKTOTAL 40 08/02/2010   CKMB 1.5 08/02/2010   TROPONINI <0.30 08/02/2010    Lab Results  Component Value Date   CHOL 211* 08/03/2010   Lab Results  Component Value Date   HDL 51 08/03/2010   Lab Results  Component Value Date   LDLCALC 141* 08/03/2010   Lab Results  Component Value Date   TRIG 94 08/03/2010   Lab Results  Component Value Date   CHOLHDL 4.1 08/03/2010   No results found for this basename: LDLDIRECT       PJK:[id number]  ASSESSMENT AND PLAN:  1. Chest pain: Some typical features.  Will plan for stress echo to  evaluate for ischemia.  She should avoid activities that stimulate chest squeezing sensation until after the stress test. 2. Hyperlipidemia: Will enroll her in lipid clinic.  WIll consider trial of PCSK9 inhibitor. Signed:   Mina Marble, MD, [facility] 04/04/2013, 11:54 PM

## 2013-04-04 NOTE — Assessment & Plan Note (Addendum)
Given her h/o TIAs and family history of heart disease, would like to get LDL to at least < 100 mg/dL on statin if possible.  Given she has failed a few statins so far, will start her on pravastatin 10 mg qhs as this is extremely well tolerated.  Most brand name drugs aren't option at this time due to cost.  Will recheck lipid/liver in 3 months, and titrate pravastatin if needed, assuming she can tolerate.  If she has problems tolerating pravastatin, she would like to try and enroll into PCSK-9 inhibitor study.  She understands it is a SQ injection, and placebo controlled study.  Would rather not take Slo-niacin given its potential for hepatotoxicity.  If she needs niacin in future, would use IR niacin instead, but will stop niacin for now, and see how pravastatin does.  She is agreeable to this. Patient will f/u with Dr. Irish Lack for stress test in near future.

## 2013-04-04 NOTE — Patient Instructions (Signed)
Your physician has requested that you have a stress echocardiogram. For further information please visit [email or web address]. Please follow instruction sheet as given.   

## 2013-04-04 NOTE — Progress Notes (Signed)
Vicki [name] is pleasant 63 y.o. WF referred to lipid clinic by Dr. Carol Ada with [facility] [place].  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 She wasn't able to afford Zetia,Welchol, or Niaspan.  She started Slo-niacin a few days ago.  Vicki [name] hasn't seen a cardiologist before but given she mentions have chest tightness after 5 minutes on her tread climber, she was worked in to see Dr. Irish Lack today, who is going to get a stress test on her in the near future.  Vicki [name] is interested in getting herself on something to better control her cholesterol, however wants to make sure it is something she can tolerate.    RF:  TIA, HTN, age - LDL goal < 100 mg/dL at least Meds:  Slo-niacin 500 mg qhs for a week Intolerant:  Crestor (muscle and joint aches); Simvastatin (muscle aches, and some joint swelling) Can't afford:  Zetia, Welchol, Niaspan.  Family history:  Mother had multiple TIAs/CVAs and ultimately killed her.  5 uncles that have all had MIs.  She has a brother who has had an MI as well. Social history:  Former smoker.  Rarely drinks alcohol (1 glass of wine per week).  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  Diet:  Eats a low fat diet already.  She recently cut out carbs from her diet, and lost 15 labs over past 4 months. Exercise:  Uses her tread climber a few days of the week for 5-10 minutes.  She is on her feet all day as a Art gallery manager, so doesn't do other exercises.  Labs: 03/2013:  LDL 180, HDL 49, TC 268, TG 194 (not on lipid lowering therapy - niacin was started at this time)  Current Outpatient Prescriptions  Medication Sig Dispense Refill  . acamprosate (CAMPRAL) 333 MG tablet Take 333 mg by mouth 3 (three) times daily with meals.      Marland Kitchen aspirin 325 MG tablet Take 325 mg by mouth daily.      . Cholecalciferol (VITAMIN D PO) Take 5,000 mg by mouth 2 (two) times daily.       Javier Docker Oil 300 MG CAPS Take by mouth.      . losartan  (COZAAR) 100 MG tablet Take 20 mg by mouth daily.       Marland Kitchen MAGNESIUM PO Take 133 mg by mouth daily.       . milk thistle 175 MG tablet Take 175 mg by mouth daily. 250mg       . niacin (SLO-NIACIN) 500 MG tablet Take 500 mg by mouth at bedtime.      . Omega-3 Fatty Acids (FISH OIL) 1000 MG CAPS Take 1,000 mg by mouth daily.      . Omega-3 Fatty Acids (OMEGA 3 PO) Take by mouth. 90 mg      . pravastatin (PRAVACHOL) 10 MG tablet Take 1 tablet (10 mg total) by mouth daily.  30 tablet  5  . selenium 50 MCG TABS tablet Take 50 mcg by mouth daily. 200 mg       No current facility-administered medications for this visit.   Allergies  Allergen Reactions  . Codeine     headache  . Crestor [Rosuvastatin]     Muscle aches  . Simvastatin     Muscle aches   Family History  Problem Relation Age of Onset  . Stroke Mother   . Stroke Father   . Depression Father   . Hypertension  Brother   . Heart disease Brother     stint

## 2013-04-04 NOTE — Patient Instructions (Addendum)
1.  Established with Dr. Irish Lack and got EKG today 2.  Will set up for stress test for chest tightness following trend climber 3.  Stop slo-niacin. 4.  Start pravastatin 10 mg once daily in the evening time.  Call Ysidro Evert if muslce aches or side effects occur, and will try to enroll you into clinical trial we discussed. 5.  Recheck cholesterol and liver function here in 3 months (06/24/13 - fasting labs, lab opens at 7:30, show up anytime after this), and see Ysidro Evert the following day on 06/25/13 at 9:00 am to review.

## 2013-04-24 ENCOUNTER — Telehealth: Payer: Self-pay | Admitting: Pharmacist

## 2013-04-24 ENCOUNTER — Ambulatory Visit (HOSPITAL_BASED_OUTPATIENT_CLINIC_OR_DEPARTMENT_OTHER): Payer: BC Managed Care – PPO

## 2013-04-24 ENCOUNTER — Ambulatory Visit (HOSPITAL_COMMUNITY): Payer: BC Managed Care – PPO | Attending: Cardiovascular Disease | Admitting: Cardiology

## 2013-04-24 DIAGNOSIS — R079 Chest pain, unspecified: Secondary | ICD-10-CM | POA: Insufficient documentation

## 2013-04-24 DIAGNOSIS — R0989 Other specified symptoms and signs involving the circulatory and respiratory systems: Secondary | ICD-10-CM

## 2013-04-24 NOTE — Telephone Encounter (Signed)
Patient called stating she is having pain in her joints since starting pravastatin 10 mg last month.    Patient has h/o TIA x 2 and pending stress test.  May be able to enroll her into PCSK-9 inhibitor study.  Patient and I discussed this in the past.  We await results from stress test and call patient with options.

## 2013-04-24 NOTE — Progress Notes (Signed)
Stress echo performed.

## 2013-04-25 NOTE — Telephone Encounter (Signed)
Patient had to stop pravastatin due to joint aches.  Since she has failed 3 statins now, including pravastatin 10 mg qd,and has h/o multiple CVA/TIAs, she is eligible for SPIRE-2 clinical trial, and she wishes to proceed.  She can't afford Zetia, Welchol, or niaspan.  I've sent her information to [place] for them to contact and hopefully enroll her into study.  To Dr. Irish Lack as Juluis Rainier.

## 2013-05-15 ENCOUNTER — Encounter: Payer: Self-pay | Admitting: Certified Nurse Midwife

## 2013-05-15 ENCOUNTER — Ambulatory Visit (INDEPENDENT_AMBULATORY_CARE_PROVIDER_SITE_OTHER): Payer: BC Managed Care – PPO | Admitting: Certified Nurse Midwife

## 2013-05-15 VITALS — BP 110/72 | HR 70 | Resp 16 | Ht 61.75 in | Wt 169.0 lb

## 2013-05-15 DIAGNOSIS — Z01419 Encounter for gynecological examination (general) (routine) without abnormal findings: Secondary | ICD-10-CM

## 2013-05-15 DIAGNOSIS — Z23 Encounter for immunization: Secondary | ICD-10-CM

## 2013-05-15 DIAGNOSIS — Z0000 Encounter for general adult medical examination without abnormal findings: Secondary | ICD-10-CM

## 2013-05-15 NOTE — Patient Instructions (Signed)

## 2013-05-15 NOTE — Progress Notes (Signed)
63 y.o. [id number] Married Caucasian Fe here for annual exam.  Menopausal no HRT. Denies vaginal bleeding or vaginal dryness. Sees PCP for aex,and Hypertension management. Cardiology is managing cholesterol now and all labs. Happy with change. Social stress with daughters spouse terminal with brain cancer. Patient helping with grandsons as much as possible and supporting daughter. No other health issues today.  Patient's last menstrual period was 07/18/2011.          Sexually active: yes  The current method of family planning is post menopausal status.    Exercising: yes  cardio Smoker:  no  Health Maintenance: Pap:  05-09-12 neg MMG: 05-18-11 normal  Patient will schedule Colonoscopy:  Plans to schedule later in year declines IFOB. BMD:  17 yrs ago TDaP:  unsure Labs: none Self breast exam: done monthly   reports that she has quit smoking. She does not have any smokeless tobacco history on file. She reports that she does not drink alcohol or use illicit drugs.  Past Medical History  Diagnosis Date  . Migraines     with aura  . 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 Hyperlipidemia     Past Surgical History  Procedure Laterality Date  . Hand surgery      nerve & tendon repair  . Breast enhancement surgery    . Facial cosmetic surgery      Current Outpatient Prescriptions  Medication Sig Dispense Refill  . aspirin 325 MG tablet Take 325 mg by mouth daily.      . Cholecalciferol (VITAMIN D PO) Take 5,000 mg by mouth. 4 a day      . Krill Oil 300 MG CAPS Take by mouth.      . losartan (COZAAR) 100 MG tablet Take by mouth. Take 1/2 to 1 daily      . MAGNESIUM PO Take 133 mg by mouth daily.       . milk thistle 175 MG tablet Take 175 mg by mouth daily. 250mg       . Omega-3 Fatty Acids (FISH OIL) 1000 MG CAPS Take 1,000 mg by mouth daily.       No current facility-administered medications for this visit.    Family History  Problem Relation Age of Onset  . Stroke Mother   .  Stroke Father   . Depression Father   . Hypertension Brother   . Heart disease Brother     stint    ROS:  Pertinent items are noted in HPI.  Otherwise, a comprehensive ROS was negative.  Exam:   BP 110/72  Pulse 70  Resp 16  Ht 5' 1.75" (1.568 m)  Wt 169 lb (76.658 kg)  BMI 31.18 kg/m2  LMP 07/18/2011 Height: 5' 1.75" (156.8 cm)  Ht Readings from Last 3 Encounters:  05/15/13 5' 1.75" (1.568 m)  04/04/13 5\' 2"  (1.575 m)  05/09/12 5\' 2"  (1.575 m)    General appearance: alert, cooperative and appears stated age Head: Normocephalic, without obvious abnormality, atraumatic Neck: no adenopathy, supple, symmetrical, trachea midline and thyroid normal to inspection and palpation and non-palpable Lungs: clear to auscultation bilaterally Breasts: normal appearance, no masses or tenderness, No nipple retraction or dimpling, No nipple discharge or bleeding, No axillary or supraclavicular adenopathy Heart: regular rate and rhythm Abdomen: soft, non-tender; no masses,  no organomegaly Extremities: extremities normal, atraumatic, no cyanosis or edema Skin: Skin color, texture, turgor normal. No rashes or lesions Lymph nodes: Cervical, supraclavicular, and axillary nodes  normal. No abnormal inguinal nodes palpated Neurologic: Grossly normal   Pelvic: External genitalia:  no lesions              Urethra:  normal appearing urethra with no masses, tenderness or lesions              Bartholin's and Skene's: normal                 Vagina: normal appearing vagina with normal color and discharge, no lesions              Cervix: normal, non tender              Pap taken: yes Bimanual Exam:  Uterus:  normal size, contour, position, consistency, mobility, non-tender and anteverted              Adnexa: normal adnexa and no mass, fullness, tenderness               Rectovaginal: Confirms               Anus:  normal sphincter tone, no lesions  A:  Well Woman with normal exam  Menopausal no  HRT  Hypertension/cholesterol management with cardiology, PCP    Immunization update  Social stress  P:   Reviewed health and wellness pertinent to exam  Aware of need to evaluate if vaginal bleeding  Continue follow up with MD as indicated  Requests TDAP  Encouraged hospice support for family and friends as needed.  Pap smear taken today with HPVHR  Mammogram Yearly stressed  counseled on breast self exam, mammography screening, adequate intake of calcium and vitamin D, diet and exercise  return annually or prn  An After Visit Summary was printed and given to the patient.

## 2013-05-16 NOTE — Progress Notes (Signed)
Reviewed personally.  M. Suzanne Asaf Elmquist, MD.  

## 2013-05-17 ENCOUNTER — Telehealth: Payer: Self-pay | Admitting: *Deleted

## 2013-05-17 LAB — IPS PAP TEST WITH HPV

## 2013-05-17 NOTE — Telephone Encounter (Signed)
Patient just had AEX on 05-15-13.  There is an old PUS order from previous year AEX that needs to be reordered or canceled.

## 2013-06-06 NOTE — Telephone Encounter (Signed)
Have left message for patient to call  So we could discuss just endo biopsy per Dr. [name] Heck suggestion. No return call yet.

## 2013-06-21 ENCOUNTER — Telehealth: Payer: Self-pay | Admitting: Interventional Cardiology

## 2013-06-21 NOTE — Telephone Encounter (Signed)
Went over appts with pt.

## 2013-06-21 NOTE — Telephone Encounter (Signed)
New Prob   Pt has some questions regarding upcoming appointment next week. Please call.

## 2013-06-24 ENCOUNTER — Other Ambulatory Visit: Payer: BC Managed Care – PPO

## 2013-06-25 ENCOUNTER — Ambulatory Visit: Payer: BC Managed Care – PPO | Admitting: Pharmacist

## 2013-07-15 NOTE — Telephone Encounter (Signed)
Debbi, do you want any further follow-up on this ultrasound?

## 2013-07-15 NOTE — Telephone Encounter (Signed)
Will try patient again.

## 2013-07-18 ENCOUNTER — Telehealth: Payer: Self-pay | Admitting: Certified Nurse Midwife

## 2013-07-18 NOTE — Telephone Encounter (Signed)
Left message again on answering machine

## 2013-07-18 NOTE — Telephone Encounter (Signed)
LMTCB

## 2013-07-22 NOTE — Telephone Encounter (Signed)
Vicki [name],   At this point, if she does not respond,I think she needs a letter from you documenting why you recommended this and risks of non-compliance.  Is this something you would discharge her for? Just my thoughts but we have really worked to [employer] her in scheduling.

## 2013-08-01 NOTE — Telephone Encounter (Signed)
Multiple attempts to contact patient, including calls by French Ana CNM, no response from patient. Any further follow-up needed?

## 2013-08-02 NOTE — Telephone Encounter (Signed)
Had had no VB in over a year.  VB indicated on exam in 2014.  Nothing 2015.  Pap normal with neg HR HPV.  OK to close encounter.  No letter needed.

## 2013-08-21 ENCOUNTER — Other Ambulatory Visit: Payer: Self-pay | Admitting: *Deleted

## 2013-08-21 MED ORDER — AMBULATORY NON FORMULARY MEDICATION: 150.0000 mg | Status: DC

## 2013-10-21 ENCOUNTER — Telehealth: Payer: Self-pay | Admitting: Interventional Cardiology

## 2013-10-21 NOTE — Telephone Encounter (Addendum)
Spoke with pt and BP is now 170/113 with HR 113 bpm. Pt has only taken 1/2 tablet of losartan 100 mg today and then she will take another 1/2 tablet tonight. Bp usually runs 130's/80-90's with occasional diastolic in the lower 720'N.

## 2013-10-21 NOTE — Telephone Encounter (Addendum)
Per Dr. Acie Fredrickson pt should take her 1/2 tablet of losartan as scheduled tonight until further recommendation from Dr. Irish Lack. Pt notified.

## 2013-10-21 NOTE — Telephone Encounter (Signed)
New message      Want to know if Dr Irish Lack will refill her bp medication.  Her PCP, Carol Ada MD will not refill it past 15 pills.  It is losartan 100mg .  The pharmacy is [facility], [address] [place].  Pt said her bp is 202/110 and her heart rate is 102.  I never understood why her PCP wanted [place] to refill medication.

## 2013-10-22 NOTE — Telephone Encounter (Signed)
Continue to take extra half tab of losartan in the evening if BP > 150/90

## 2013-10-22 NOTE — Telephone Encounter (Signed)
lmtrc

## 2013-10-24 NOTE — Telephone Encounter (Signed)
Pt notified. Pts BP is doing better, but BP is still running 150's/90's with the 1/2 tablet BID. Pt does take BP reading before medication. She will start taking BP two hours after medication and call me if BP remains elevated.

## 2013-10-30 ENCOUNTER — Other Ambulatory Visit: Payer: BC Managed Care – PPO

## 2013-10-30 NOTE — Research (Signed)
Patient to [facility] for scheduled visit.  BP 113/93 Pulse  87 and BP 126/93 Pulse 89.  Taken in  Left arm, 3 minutes apart.  No complaints, stated feeling good.  Resupplied with study medication.

## 2013-11-18 ENCOUNTER — Encounter: Payer: Self-pay | Admitting: Certified Nurse Midwife

## 2013-12-24 DIAGNOSIS — Z0289 Encounter for other administrative examinations: Secondary | ICD-10-CM

## 2014-03-11 ENCOUNTER — Telehealth: Payer: Self-pay | Admitting: Certified Nurse Midwife

## 2014-03-11 NOTE — Telephone Encounter (Signed)
Left patient a message to call back to reschedule her AEX. °

## 2014-04-02 ENCOUNTER — Encounter: Payer: Self-pay | Admitting: *Deleted

## 2014-04-17 ENCOUNTER — Telehealth: Payer: Self-pay | Admitting: *Deleted

## 2014-04-17 DIAGNOSIS — E785 Hyperlipidemia, unspecified: Secondary | ICD-10-CM

## 2014-04-17 NOTE — Telephone Encounter (Signed)
Called pt and informed her that Dr. Irish Lack would like a lipid and liver test done. Made appt for 04/23/14. Pt verbalized understanding and was in agreement with this plan.

## 2014-04-17 NOTE — Telephone Encounter (Signed)
-----   Message from Jettie Booze, MD sent at 04/17/2014  3:40 PM EDT ----- She needs lipids and liver test done

## 2014-04-23 ENCOUNTER — Other Ambulatory Visit (INDEPENDENT_AMBULATORY_CARE_PROVIDER_SITE_OTHER): Payer: BLUE CROSS/BLUE SHIELD | Admitting: *Deleted

## 2014-04-23 DIAGNOSIS — E785 Hyperlipidemia, unspecified: Secondary | ICD-10-CM | POA: Diagnosis not present

## 2014-04-23 LAB — LIPID PANEL
CHOLESTEROL: 257 mg/dL — AB (ref 0–200)
HDL: 48.7 mg/dL (ref >39.00)
LDL CALC: 171 mg/dL — AB (ref 0–99)
NONHDL: 208.3
Total CHOL/HDL Ratio: 5
Triglycerides: 185 mg/dL — ABNORMAL HIGH (ref 0.0–149.0)
VLDL: 37 mg/dL (ref 0.0–40.0)

## 2014-04-23 LAB — HEPATIC FUNCTION PANEL
ALBUMIN: 3.8 g/dL (ref 3.5–5.2)
ALT: 14 U/L (ref 0–35)
AST: 18 U/L (ref 0–37)
Alkaline Phosphatase: 86 U/L (ref 39–117)
Bilirubin, Direct: 0.1 mg/dL (ref 0.0–0.3)
TOTAL PROTEIN: 6.8 g/dL (ref 6.0–8.3)
Total Bilirubin: 0.3 mg/dL (ref 0.2–1.2)

## 2014-05-06 ENCOUNTER — Encounter: Payer: Self-pay | Admitting: *Deleted

## 2014-05-06 NOTE — Progress Notes (Signed)
Subject re- consented to Main Informed Consent Version 5.0 and addendum Version 2.0.  IRB approval date [id number].

## 2014-05-18 DIAGNOSIS — F10929 Alcohol use, unspecified with intoxication, unspecified: Secondary | ICD-10-CM

## 2014-05-18 DIAGNOSIS — G459 Transient cerebral ischemic attack, unspecified: Secondary | ICD-10-CM | POA: Insufficient documentation

## 2014-05-18 DIAGNOSIS — I639 Cerebral infarction, unspecified: Secondary | ICD-10-CM

## 2014-05-18 HISTORY — DX: Alcohol use, unspecified with intoxication, unspecified: F10.929

## 2014-05-18 HISTORY — DX: Cerebral infarction, unspecified: I63.9

## 2014-05-18 HISTORY — DX: Transient cerebral ischemic attack, unspecified: G45.9

## 2014-05-20 ENCOUNTER — Ambulatory Visit: Payer: BC Managed Care – PPO | Admitting: Certified Nurse Midwife

## 2014-05-24 ENCOUNTER — Observation Stay (HOSPITAL_COMMUNITY)
Admission: EM | Admit: 2014-05-24 | Discharge: 2014-05-25 | Disposition: A | Discharge status: Home / Self Care | Payer: BLUE CROSS/BLUE SHIELD | Attending: Internal Medicine | Admitting: Internal Medicine

## 2014-05-24 ENCOUNTER — Emergency Department (HOSPITAL_COMMUNITY): Payer: BLUE CROSS/BLUE SHIELD

## 2014-05-24 ENCOUNTER — Encounter (HOSPITAL_COMMUNITY): Payer: Self-pay | Admitting: *Deleted

## 2014-05-24 DIAGNOSIS — R202 Paresthesia of skin: Secondary | ICD-10-CM

## 2014-05-24 DIAGNOSIS — F10129 Alcohol abuse with intoxication, unspecified: Secondary | ICD-10-CM | POA: Diagnosis present

## 2014-05-24 DIAGNOSIS — G459 Transient cerebral ischemic attack, unspecified: Principal | ICD-10-CM | POA: Insufficient documentation

## 2014-05-24 DIAGNOSIS — Z87891 Personal history of nicotine dependence: Secondary | ICD-10-CM | POA: Diagnosis not present

## 2014-05-24 DIAGNOSIS — Y906 Blood alcohol level of 120-199 mg/100 ml: Secondary | ICD-10-CM | POA: Diagnosis not present

## 2014-05-24 DIAGNOSIS — F329 Major depressive disorder, single episode, unspecified: Secondary | ICD-10-CM | POA: Diagnosis not present

## 2014-05-24 DIAGNOSIS — R200 Anesthesia of skin: Secondary | ICD-10-CM | POA: Diagnosis present

## 2014-05-24 DIAGNOSIS — E785 Hyperlipidemia, unspecified: Secondary | ICD-10-CM | POA: Diagnosis present

## 2014-05-24 DIAGNOSIS — R55 Syncope and collapse: Secondary | ICD-10-CM | POA: Diagnosis present

## 2014-05-24 DIAGNOSIS — I639 Cerebral infarction, unspecified: Secondary | ICD-10-CM | POA: Diagnosis not present

## 2014-05-24 DIAGNOSIS — Z888 Allergy status to other drugs, medicaments and biological substances status: Secondary | ICD-10-CM | POA: Insufficient documentation

## 2014-05-24 DIAGNOSIS — I10 Essential (primary) hypertension: Secondary | ICD-10-CM | POA: Insufficient documentation

## 2014-05-24 DIAGNOSIS — G43909 Migraine, unspecified, not intractable, without status migrainosus: Secondary | ICD-10-CM | POA: Insufficient documentation

## 2014-05-24 DIAGNOSIS — E871 Hypo-osmolality and hyponatremia: Secondary | ICD-10-CM | POA: Diagnosis present

## 2014-05-24 DIAGNOSIS — F32A Depression, unspecified: Secondary | ICD-10-CM | POA: Insufficient documentation

## 2014-05-24 DIAGNOSIS — M6289 Other specified disorders of muscle: Secondary | ICD-10-CM | POA: Diagnosis not present

## 2014-05-24 DIAGNOSIS — R531 Weakness: Secondary | ICD-10-CM | POA: Diagnosis present

## 2014-05-24 DIAGNOSIS — F1010 Alcohol abuse, uncomplicated: Secondary | ICD-10-CM | POA: Diagnosis present

## 2014-05-24 DIAGNOSIS — Z8673 Personal history of transient ischemic attack (TIA), and cerebral infarction without residual deficits: Secondary | ICD-10-CM | POA: Insufficient documentation

## 2014-05-24 HISTORY — DX: Hypo-osmolality and hyponatremia: E87.1

## 2014-05-24 HISTORY — DX: Syncope and collapse: R55

## 2014-05-24 HISTORY — DX: Alcohol abuse, uncomplicated: F10.10

## 2014-05-24 HISTORY — DX: Weakness: R53.1

## 2014-05-24 HISTORY — DX: Anesthesia of skin: R20.0

## 2014-05-24 HISTORY — DX: Alcohol abuse with intoxication, unspecified: F10.129

## 2014-05-24 LAB — DIFFERENTIAL
Basophils Absolute: 0.1 10*3/uL (ref 0.0–0.1)
Basophils Relative: 1 % (ref 0–1)
EOS PCT: 1 % (ref 0–5)
Eosinophils Absolute: 0.1 10*3/uL (ref 0.0–0.7)
LYMPHS PCT: 30 % (ref 12–46)
Lymphs Abs: 2.5 10*3/uL (ref 0.7–4.0)
MONOS PCT: 5 % (ref 3–12)
Monocytes Absolute: 0.4 10*3/uL (ref 0.1–1.0)
NEUTROS ABS: 5.3 10*3/uL (ref 1.7–7.7)
Neutrophils Relative %: 63 % (ref 43–77)

## 2014-05-24 LAB — CBC
HEMATOCRIT: 43.2 % (ref 36.0–46.0)
Hemoglobin: 15 g/dL (ref 12.0–15.0)
MCH: 31.3 pg (ref 26.0–34.0)
MCHC: 34.7 g/dL (ref 30.0–36.0)
MCV: 90 fL (ref 78.0–100.0)
Platelets: 284 10*3/uL (ref 150–400)
RBC: 4.8 MIL/uL (ref 3.87–5.11)
RDW: 12.9 % (ref 11.5–15.5)
WBC: 8.3 10*3/uL (ref 4.0–10.5)

## 2014-05-24 LAB — COMPREHENSIVE METABOLIC PANEL
ALBUMIN: 4 g/dL (ref 3.5–5.0)
ALT: 22 U/L (ref 14–54)
AST: 27 U/L (ref 15–41)
Alkaline Phosphatase: 74 U/L (ref 38–126)
Anion gap: 9 (ref 5–15)
BUN: 9 mg/dL (ref 6–20)
CALCIUM: 8.9 mg/dL (ref 8.9–10.3)
CO2: 19 mmol/L — ABNORMAL LOW (ref 22–32)
CREATININE: 0.71 mg/dL (ref 0.44–1.00)
Chloride: 98 mmol/L — ABNORMAL LOW (ref 101–111)
GFR calc Af Amer: >60 mL/min (ref >60)
Glucose, Bld: 113 mg/dL — ABNORMAL HIGH (ref 70–99)
Potassium: 4 mmol/L (ref 3.5–5.1)
SODIUM: 126 mmol/L — AB (ref 135–145)
TOTAL PROTEIN: 7 g/dL (ref 6.5–8.1)
Total Bilirubin: 0.6 mg/dL (ref 0.3–1.2)

## 2014-05-24 LAB — PROTIME-INR
INR: 0.95 (ref 0.00–1.49)
Prothrombin Time: 12.8 seconds (ref 11.6–15.2)

## 2014-05-24 LAB — CBG MONITORING, ED: GLUCOSE-CAPILLARY: 109 mg/dL — AB (ref 70–99)

## 2014-05-24 LAB — I-STAT CHEM 8, ED
BUN: 10 mg/dL (ref 6–20)
Calcium, Ion: 1.1 mmol/L — ABNORMAL LOW (ref 1.13–1.30)
Chloride: 98 mmol/L — ABNORMAL LOW (ref 101–111)
Creatinine, Ser: 0.9 mg/dL (ref 0.44–1.00)
Glucose, Bld: 116 mg/dL — ABNORMAL HIGH (ref 70–99)
HEMATOCRIT: 49 % — AB (ref 36.0–46.0)
HEMOGLOBIN: 16.7 g/dL — AB (ref 12.0–15.0)
POTASSIUM: 4.1 mmol/L (ref 3.5–5.1)
Sodium: 133 mmol/L — ABNORMAL LOW (ref 135–145)
TCO2: 16 mmol/L (ref 0–100)

## 2014-05-24 LAB — APTT: APTT: 31 s (ref 24–37)

## 2014-05-24 LAB — I-STAT TROPONIN, ED: Troponin i, poc: 0 ng/mL (ref 0.00–0.08)

## 2014-05-24 LAB — ETHANOL: Alcohol, Ethyl (B): 131 mg/dL — ABNORMAL HIGH (ref <5)

## 2014-05-24 MED ORDER — SODIUM CHLORIDE 0.9 % IJ SOLN: 3.0000 mL | Freq: Two times a day (BID) | INTRAMUSCULAR | Status: DC

## 2014-05-24 MED ORDER — ACETAMINOPHEN 650 MG RE SUPP: 650.0000 mg | Freq: Four times a day (QID) | RECTAL | Status: DC | PRN

## 2014-05-24 MED ORDER — OXYCODONE HCL 5 MG PO TABS: 5.0000 mg | ORAL_TABLET | ORAL | Status: DC | PRN

## 2014-05-24 MED ORDER — ONDANSETRON HCL 4 MG PO TABS
4.0000 mg | ORAL_TABLET | Freq: Four times a day (QID) | ORAL | Status: DC | PRN
  Administered 2014-05-24: 4 mg via ORAL
  Filled 2014-05-24: qty 1

## 2014-05-24 MED ORDER — LOSARTAN POTASSIUM 50 MG PO TABS
50.0000 mg | ORAL_TABLET | Freq: Two times a day (BID) | ORAL | Status: DC
  Administered 2014-05-24 – 2014-05-25 (×2): 50 mg via ORAL
  Filled 2014-05-24 (×2): qty 1

## 2014-05-24 MED ORDER — LORAZEPAM 1 MG PO TABS
0.0000 mg | ORAL_TABLET | Freq: Two times a day (BID) | ORAL | Status: DC
Start: 2014-05-26 — End: 2014-05-25

## 2014-05-24 MED ORDER — SODIUM CHLORIDE 0.9 % IV SOLN
INTRAVENOUS | Status: DC
  Administered 2014-05-25: 02:00:00 via INTRAVENOUS

## 2014-05-24 MED ORDER — STROKE: EARLY STAGES OF RECOVERY BOOK
Freq: Once | Status: AC
  Administered 2014-05-24: 22:00:00

## 2014-05-24 MED ORDER — VITAMIN B-1 100 MG PO TABS
100.0000 mg | ORAL_TABLET | Freq: Every day | ORAL | Status: DC
  Administered 2014-05-24 – 2014-05-25 (×2): 100 mg via ORAL
  Filled 2014-05-24 (×2): qty 1

## 2014-05-24 MED ORDER — HYDROMORPHONE HCL 1 MG/ML IJ SOLN: 0.5000 mg | INTRAMUSCULAR | Status: DC | PRN

## 2014-05-24 MED ORDER — ENOXAPARIN SODIUM 40 MG/0.4ML ~~LOC~~ SOLN
40.0000 mg | SUBCUTANEOUS | Status: DC
  Administered 2014-05-24: 40 mg via SUBCUTANEOUS
  Filled 2014-05-24: qty 0.4

## 2014-05-24 MED ORDER — ONDANSETRON HCL 4 MG/2ML IJ SOLN: 4.0000 mg | Freq: Four times a day (QID) | INTRAMUSCULAR | Status: DC | PRN

## 2014-05-24 MED ORDER — OMEGA-3-ACID ETHYL ESTERS 1 G PO CAPS
1.0000 g | ORAL_CAPSULE | Freq: Two times a day (BID) | ORAL | Status: DC
  Administered 2014-05-25: 1 g via ORAL
  Filled 2014-05-24: qty 1

## 2014-05-24 MED ORDER — LORAZEPAM 1 MG PO TABS: 0.0000 mg | ORAL_TABLET | Freq: Four times a day (QID) | ORAL | Status: DC

## 2014-05-24 MED ORDER — LORAZEPAM 1 MG PO TABS: 1.0000 mg | ORAL_TABLET | Freq: Four times a day (QID) | ORAL | Status: DC | PRN

## 2014-05-24 MED ORDER — THIAMINE HCL 100 MG/ML IJ SOLN
100.0000 mg | Freq: Every day | INTRAMUSCULAR | Status: DC
  Filled 2014-05-24 (×2): qty 2

## 2014-05-24 MED ORDER — LORAZEPAM 2 MG/ML IJ SOLN: 1.0000 mg | Freq: Four times a day (QID) | INTRAMUSCULAR | Status: DC | PRN

## 2014-05-24 MED ORDER — SERTRALINE HCL 50 MG PO TABS
50.0000 mg | ORAL_TABLET | Freq: Two times a day (BID) | ORAL | Status: DC
  Administered 2014-05-24 – 2014-05-25 (×2): 50 mg via ORAL
  Filled 2014-05-24 (×2): qty 1

## 2014-05-24 MED ORDER — SENNOSIDES-DOCUSATE SODIUM 8.6-50 MG PO TABS: 1.0000 | ORAL_TABLET | Freq: Every evening | ORAL | Status: DC | PRN

## 2014-05-24 MED ORDER — ADULT MULTIVITAMIN W/MINERALS CH
1.0000 | ORAL_TABLET | Freq: Every day | ORAL | Status: DC
  Administered 2014-05-24 – 2014-05-25 (×2): 1 via ORAL
  Filled 2014-05-24 (×2): qty 1

## 2014-05-24 MED ORDER — ACETAMINOPHEN 325 MG PO TABS: 650.0000 mg | ORAL_TABLET | Freq: Four times a day (QID) | ORAL | Status: DC | PRN

## 2014-05-24 MED ORDER — ALUM & MAG HYDROXIDE-SIMETH 200-200-20 MG/5ML PO SUSP: 30.0000 mL | Freq: Four times a day (QID) | ORAL | Status: DC | PRN

## 2014-05-24 MED ORDER — SODIUM CHLORIDE 0.9 % IV BOLUS (SEPSIS)
1000.0000 mL | Freq: Once | INTRAVENOUS | Status: AC
  Administered 2014-05-24: 1000 mL via INTRAVENOUS

## 2014-05-24 MED ORDER — FOLIC ACID 1 MG PO TABS
1.0000 mg | ORAL_TABLET | Freq: Every day | ORAL | Status: DC
  Administered 2014-05-24 – 2014-05-25 (×2): 1 mg via ORAL
  Filled 2014-05-24 (×2): qty 1

## 2014-05-24 NOTE — ED Provider Notes (Signed)
CSN: [id number]     Arrival date & time 05/24/14  1811 History   First MD Initiated Contact with Patient 05/24/14 1853     Chief Complaint  Patient presents with  . Stroke Symptoms     (Consider location/radiation/quality/duration/timing/severity/associated sxs/prior Treatment) Patient is a 64 y.o. female presenting with neurologic complaint. The history is provided by the patient and a relative.  Neurologic Problem This is a new problem. The current episode started today. The problem occurs constantly. The problem has been unchanged. Associated symptoms include numbness (RUE) and weakness (L-sided). Pertinent negatives include no abdominal pain, chest pain, chills, coughing, diaphoresis, fatigue, fever, headaches, nausea, neck pain, rash, urinary symptoms, vertigo, visual change or vomiting. Nothing aggravates the symptoms. She has tried nothing for the symptoms. The treatment provided no relief.    Past Medical History  Diagnosis Date  . Migraines     with aura  . 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 Hyperlipidemia    Past Surgical History  Procedure Laterality Date  . Hand surgery      nerve & tendon repair  . Breast enhancement surgery    . Facial cosmetic surgery     Family History  Problem Relation Age of Onset  . Stroke Mother   . Stroke Father   . Depression Father   . Hypertension Brother   . Heart disease Brother     stint   History  Substance Use Topics  . Smoking status: Former Research scientist (life sciences)  . Smokeless tobacco: Not on file  . Alcohol Use: 2.4 oz/week    4 Glasses of wine per week   OB History    Gravida Para Term Preterm AB TAB SAB Ectopic Multiple Living   3 3 3       3      Review of Systems  Constitutional: Negative for fever, chills, diaphoresis, appetite change and fatigue.  Eyes: Negative for photophobia and visual disturbance.  Respiratory: Negative for cough, chest tightness, shortness of breath and wheezing.   Cardiovascular: Negative for  chest pain, palpitations and leg swelling.  Gastrointestinal: Negative for nausea, vomiting, abdominal pain and diarrhea.  Musculoskeletal: Negative for back pain, neck pain and neck stiffness.  Skin: Negative for color change, pallor, rash and wound.  Neurological: Positive for weakness (L-sided) and numbness (RUE). Negative for dizziness, vertigo, seizures, syncope, facial asymmetry, speech difficulty, light-headedness and headaches.  All other systems reviewed and are negative.     Allergies  Codeine; Crestor; Other; and Simvastatin  Home Medications   Prior to Admission medications   Medication Sig Start Date End Date Taking? Authorizing Provider  AMBULATORY NON FORMULARY MEDICATION Inject 150 mg into the skin every 14 (fourteen) days. Medication Name: SPIRE study drug  bococizumab vs placebo Patient taking differently: Inject 150 mg into the skin every 14 (fourteen) days. Medication Name: SPIRE study drug  bococizumab vs placebo/ inject every other Wednesday 08/21/13  Yes Hillary Bow, MD  amLODipine (NORVASC) 2.5 MG tablet Take 2.5 mg by mouth daily.   Yes Historical Provider, MD  aspirin 325 MG tablet Take 325 mg by mouth at bedtime.    Yes Historical Provider, MD  Cholecalciferol 5000 UNITS capsule Take 10,000-15,000 Units by mouth 2 (two) times daily. Usually 3 tabs in am and 2 tabs in pm   Yes Historical Provider, MD  cyanocobalamin (,VITAMIN B-12,) 1000 MCG/ML injection Inject 1,000 mcg into the muscle every 30 (thirty) days. Last injection 05/23/2014  Yes Historical Provider, MD  dimenhyDRINATE (DRAMAMINE) 50 MG tablet Take 50 mg by mouth every 8 (eight) hours as needed for nausea.   Yes Historical Provider, MD  KRILL OIL PO Take 1 capsule by mouth daily.   Yes Historical Provider, MD  losartan (COZAAR) 100 MG tablet Take 50 mg by mouth 2 (two) times daily.    Yes Historical Provider, MD  MAGNESIUM PO Take 2 tablets by mouth daily.    Yes Historical Provider, MD  MILK THISTLE  PO Take 1 tablet by mouth daily.   Yes Historical Provider, MD  naproxen sodium (ANAPROX) 220 MG tablet Take 220 mg by mouth See admin instructions. Takes 1 tab twice daily, can take addt'l tab as needed for pain   Yes Historical Provider, MD  sertraline (ZOLOFT) 100 MG tablet Take 50 mg by mouth 2 (two) times daily.   Yes Historical Provider, MD  Omega-3 Fatty Acids (FISH OIL) 1000 MG CAPS Take 1 capsule (1,000 mg total) by mouth 2 (two) times daily. 05/25/14   Robbie Lis, MD   BP 132/78 mmHg  Pulse 76  Temp(Src) 97.3 F (36.3 C) (Oral)  Resp 20  Ht 5\' 2"  (1.575 m)  Wt 174 lb 6.4 oz (79.107 kg)  BMI 31.89 kg/m2  SpO2 98%  LMP 07/18/2011 Physical Exam  Constitutional: She is oriented to person, place, and time. She appears well-developed and well-nourished. No distress.  HENT:  Head: Normocephalic and atraumatic.  Mouth/Throat: Oropharynx is clear and moist.  Eyes: Conjunctivae and EOM are normal. Pupils are equal, round, and reactive to light.  Neck: Normal range of motion. Neck supple.  Cardiovascular: Normal rate, regular rhythm, normal heart sounds and intact distal pulses.  Exam reveals no gallop and no friction rub.   No murmur heard. Pulmonary/Chest: Effort normal and breath sounds normal. No respiratory distress. She has no wheezes. She has no rales. She exhibits no tenderness.  Abdominal: Soft. Bowel sounds are normal. She exhibits no distension. There is no tenderness. There is no rebound and no guarding.  Musculoskeletal: Normal range of motion. She exhibits no edema or tenderness.  Neurological: She is alert and oriented to person, place, and time. No cranial nerve deficit or sensory deficit. She displays a negative Romberg sign. Coordination normal. GCS eye subscore is 4. GCS verbal subscore is 5. GCS motor subscore is 6.  L-sided weakness, 4/5 strength compared to right.  No sensory deficits, facial droop, ataxia  Skin: Skin is warm and dry. No rash noted. She is not  diaphoretic. No erythema. No pallor.  Nursing note and vitals reviewed.   ED Course  Procedures (including critical care time) Labs Review Labs Reviewed  COMPREHENSIVE METABOLIC PANEL - Abnormal; Notable for the following:    Sodium 126 (*)    Chloride 98 (*)    CO2 19 (*)    Glucose, Bld 113 (*)    All other components within normal limits  ETHANOL - Abnormal; Notable for the following:    Alcohol, Ethyl (B) 131 (*)    All other components within normal limits  LIPID PANEL - Abnormal; Notable for the following:    Cholesterol 235 (*)    Triglycerides 352 (*)    HDL 38 (*)    VLDL 70 (*)    LDL Cholesterol 127 (*)    All other components within normal limits  I-STAT CHEM 8, ED - Abnormal; Notable for the following:    Sodium 133 (*)    Chloride 98 (*)  Glucose, Bld 116 (*)    Calcium, Ion 1.10 (*)    Hemoglobin 16.7 (*)    HCT 49.0 (*)    All other components within normal limits  CBG MONITORING, ED - Abnormal; Notable for the following:    Glucose-Capillary 109 (*)    All other components within normal limits  PROTIME-INR  APTT  CBC  DIFFERENTIAL  HEMOGLOBIN J0D  BASIC METABOLIC PANEL  CBC  OSMOLALITY, URINE  NA AND K (SODIUM & POTASSIUM), RAND UR  I-STAT TROPOININ, ED    Imaging Review No results found.   EKG Interpretation   Date/Time:  Saturday May 24 2014 19:00:08 EDT Ventricular Rate:  80 PR Interval:  171 QRS Duration: 86 QT Interval:  400 QTC Calculation: 461 R Axis:   81 Text Interpretation:  Sinus rhythm Borderline right axis deviation Low  voltage, precordial leads Nonspecific T abnormalities, lateral leads  Baseline wander in lead(s) V6 ED PHYSICIAN INTERPRETATION AVAILABLE IN  [facility] Confirmed by TEST, Record ([id number]) on 05/26/2014 6:40:08 AM      MDM   Final diagnoses:  Left-sided weakness    64 yo F with PMH of prior CVA, alcohol abuse, migraines, HPL, presenting with concern for L-sided weakness.  Pt was reportedly found  face down on ground by family.  Last known normal 4:30 PM, about 2.5 hours PTA.  Family stood pt up and she was weak on left side and unable to bear weight on this side, so brought to ED due to concern for stroke.  Pt also reports numbness in RUE.  She endorses EtOh use today.    On exam, pt intoxicated appearing, AFVSS.  Exam with L-sided weakness, 4/5 strength compared to right.  No sensory deficits, facial droop, ataxia.  Code stroke initiated.  Neuro at bedside.  Per Neuro, inconsistent exam in setting of alcohol intoxication.  Not tPA candidate.  CT head WNL.  Pt with hyponatremia to 126 on labs, likely due to EtOh abuse.  Will admit for further workup.  No other acute events during my care.  Discussed with attending Dr. Reather Converse.      Ellwood Dense, MD 05/30/14 1235  Elnora Morrison, MD 06/03/14 [phone]

## 2014-05-24 NOTE — ED Notes (Signed)
Dr. Jenkins at bedside. 

## 2014-05-24 NOTE — ED Notes (Signed)
CBG Taken = 109

## 2014-05-24 NOTE — ED Notes (Signed)
Transporting patient to new room assignment. 

## 2014-05-24 NOTE — Consult Note (Signed)
Referring Physician: Dr. Reather Converse    Chief Complaint: code stroke, left leg weakness, right arm numbness  HPI:                                                                                                                                         Vicki [name] is an 64 y.o. 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 According to her husband, she went over to see some neighbors and came back around 4 pm and was completely normal. The, couple of hours later her son came over and found her facedown in the yard. EMS was called but patient refused transport. Husband said that by that time she was little bit confused but without evidence of face-arms-legs weakness, or slurred speech. Patient indicated that she drank several glasses of wine but that she recalls her right face being numb and tingly since this morning. She was noted to have left leg weakness upon initial assessment in the ED and thus a ode stroke was called. NIHSS 2, but inconsistent exam. CT brain was personally reviewed and showed no acute abnormality. Takes aspirin daily.   ETOH level 131    Date last known well: 05/24/14 Time last known well: unknown tPA Given: no, late presentation NIHSS: 2 MRS: 0  Past Medical History  Diagnosis Date  . Migraines     with aura  . 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 Hyperlipidemia     Past Surgical History  Procedure Laterality Date  . Hand surgery      nerve & tendon repair  . Breast enhancement surgery    . Facial cosmetic surgery      Family History  Problem Relation Age of Onset  . Stroke Mother   . Stroke Father   . Depression Father   . Hypertension Brother   . Heart disease Brother     stint   Social History:  reports that she has quit smoking. She does not have any smokeless  tobacco history on file. She reports that she does not drink alcohol or use illicit drugs. Family history: no brain tumors, brain aneurysms, or epilepsy Allergies:  Allergies  Allergen Reactions  . Other Nausea And Vomiting    Anesthesia-severe vomiting  . Codeine Other (See Comments)    headache  . Crestor [Rosuvastatin] Other (See Comments)    Muscle aches  . Simvastatin Other (See Comments)    Muscle aches    Medications:  I have reviewed the patient's current medications.  ROS:                                                                                                                                       History obtained from the patient, husband, and chart review  General ROS: negative for - chills, fatigue, fever, night sweats,  or weight loss Psychological ROS: negative for - behavioral disorder, hallucinations, memory difficulties, or suicidal ideation Ophthalmic ROS: negative for - blurry vision, double vision, eye pain or loss of vision ENT ROS: negative for - epistaxis, nasal discharge, oral lesions, sore throat, tinnitus or vertigo Allergy and Immunology ROS: negative for - hives or itchy/watery eyes Hematological and Lymphatic ROS: negative for - bleeding problems, bruising or swollen lymph nodes Endocrine ROS: negative for - galactorrhea, hair pattern changes, polydipsia/polyuria or temperature intolerance Respiratory ROS: negative for - cough, hemoptysis, shortness of breath or wheezing Cardiovascular ROS: negative for - chest pain, dyspnea on exertion, edema or irregular heartbeat Gastrointestinal ROS: negative for - abdominal pain, diarrhea, hematemesis, nausea/vomiting or stool incontinence Genito-Urinary ROS: negative for - dysuria, hematuria, incontinence or urinary frequency/urgency Musculoskeletal ROS: negative for - joint swelling or  muscular weakness Neurological ROS: as noted in HPI Dermatological ROS: negative for rash and skin lesion changes   Physical exam: pleasant female in no apparent distress, saying that she is " very emotional right now". Head: normocephalic. Neck: supple, no bruits, no JVD. Cardiac: no murmurs. Lungs: clear. Abdomen: soft, no tender, no mass. Extremities: no edema, clubbing, or cyanosis. Blood pressure 121/66, pulse 85, resp. rate 13, last menstrual period 07/18/2011, SpO2 97 %. Skin: no rash Neurologic Examination:                                                                                                      General: Mental Status: Alert, oriented, thought content appropriate.  Speech fluent without evidence of aphasia.  Able to follow 3 step commands without difficulty. Cranial Nerves: II: Discs flat bilaterally; Visual fields grossly normal, pupils equal, round, reactive to light and accommodation III,IV, VI: ptosis not present, extra-ocular motions intact bilaterally V,VII: smile symmetric, facial light touch sensation normal bilaterally VIII: hearing normal bilaterally IX,X: uvula rises symmetrically XI: bilateral shoulder shrug XII: midline tongue extension without atrophy or fasciculations Motor: Significant for LLE drift Tone and bulk:normal tone throughout; no atrophy noted Sensory: Pinprick and light touch mildly diminished right face Deep Tendon Reflexes:  Right: Upper Extremity  Left: Upper extremity   biceps (C-5 to C-6) 2/4   biceps (C-5 to C-6) 2/4 tricep (C7) 2/4    triceps (C7) 2/4 Brachioradialis (C6) 2/4  Brachioradialis (C6) 2/4  Lower Extremity Lower Extremity  quadriceps (L-2 to L-4) 2/4   quadriceps (L-2 to L-4) 2/4 Achilles (S1) 2/4   Achilles (S1) 2/4  Plantars: Right: downgoing   Left: downgoing Cerebellar: normal finger-to-nose,  normal heel-to-shin test Gait:  No tested due to multiple leads   Results for orders placed or performed  during the hospital encounter of 05/24/14 (from the past 48 hour(s))  Ethanol     Status: Abnormal   Collection Time: 05/24/14  6:21 PM  Result Value Ref Range   Alcohol, Ethyl (B) 131 (H) <5 mg/dL    Comment:        LOWEST DETECTABLE LIMIT FOR SERUM ALCOHOL IS 11 mg/dL FOR MEDICAL PURPOSES ONLY   Protime-INR     Status: None   Collection Time: 05/24/14  6:26 PM  Result Value Ref Range   Prothrombin Time 12.8 11.6 - 15.2 seconds   INR 0.95 0.00 - 1.49  APTT     Status: None   Collection Time: 05/24/14  6:26 PM  Result Value Ref Range   aPTT 31 24 - 37 seconds  CBC     Status: None   Collection Time: 05/24/14  6:26 PM  Result Value Ref Range   WBC 8.3 4.0 - 10.5 K/uL   RBC 4.80 3.87 - 5.11 MIL/uL   Hemoglobin 15.0 12.0 - 15.0 g/dL   HCT 43.2 36.0 - 46.0 %   MCV 90.0 78.0 - 100.0 fL   MCH 31.3 26.0 - 34.0 pg   MCHC 34.7 30.0 - 36.0 g/dL   RDW 12.9 11.5 - 15.5 %   Platelets 284 150 - 400 K/uL  Differential     Status: None   Collection Time: 05/24/14  6:26 PM  Result Value Ref Range   Neutrophils Relative % 63 43 - 77 %   Neutro Abs 5.3 1.7 - 7.7 K/uL   Lymphocytes Relative 30 12 - 46 %   Lymphs Abs 2.5 0.7 - 4.0 K/uL   Monocytes Relative 5 3 - 12 %   Monocytes Absolute 0.4 0.1 - 1.0 K/uL   Eosinophils Relative 1 0 - 5 %   Eosinophils Absolute 0.1 0.0 - 0.7 K/uL   Basophils Relative 1 0 - 1 %   Basophils Absolute 0.1 0.0 - 0.1 K/uL  Comprehensive metabolic panel     Status: Abnormal   Collection Time: 05/24/14  6:26 PM  Result Value Ref Range   Sodium 126 (L) 135 - 145 mmol/L   Potassium 4.0 3.5 - 5.1 mmol/L   Chloride 98 (L) 101 - 111 mmol/L   CO2 19 (L) 22 - 32 mmol/L   Glucose, Bld 113 (H) 70 - 99 mg/dL   BUN 9 6 - 20 mg/dL   Creatinine, Ser 0.71 0.44 - 1.00 mg/dL   Calcium 8.9 8.9 - 10.3 mg/dL   Total Protein 7.0 6.5 - 8.1 g/dL   Albumin 4.0 3.5 - 5.0 g/dL   AST 27 15 - 41 U/L   ALT 22 14 - 54 U/L   Alkaline Phosphatase 74 38 - 126 U/L   Total Bilirubin  0.6 0.3 - 1.2 mg/dL   GFR calc non Af Amer >60 >60 mL/min   GFR calc Af Amer >60 >60 mL/min    Comment: (NOTE) The eGFR has  been calculated using the CKD EPI equation. This calculation has not been validated in all clinical situations. eGFR's persistently <60 mL/min signify possible Chronic Kidney Disease.    Anion gap 9 5 - 15  I-Stat Chem 8, ED  (not at [facility], [facility])     Status: Abnormal   Collection Time: 05/24/14  6:32 PM  Result Value Ref Range   Sodium 133 (L) 135 - 145 mmol/L   Potassium 4.1 3.5 - 5.1 mmol/L   Chloride 98 (L) 101 - 111 mmol/L   BUN 10 6 - 20 mg/dL   Creatinine, Ser 0.90 0.44 - 1.00 mg/dL   Glucose, Bld 116 (H) 70 - 99 mg/dL   Calcium, Ion 1.10 (L) 1.13 - 1.30 mmol/L   TCO2 16 0 - 100 mmol/L   Hemoglobin 16.7 (H) 12.0 - 15.0 g/dL   HCT 49.0 (H) 36.0 - 46.0 %  I-stat troponin, ED (not at [facility], [facility])     Status: None   Collection Time: 05/24/14  6:34 PM  Result Value Ref Range   Troponin i, poc 0.00 0.00 - 0.08 ng/mL   Comment 3            Comment: Due to the release kinetics of cTnI, a negative result within the first hours of the onset of symptoms does not rule out myocardial infarction with certainty. If myocardial infarction is still suspected, repeat the test at appropriate intervals.   CBG monitoring, ED     Status: Abnormal   Collection Time: 05/24/14  7:22 PM  Result Value Ref Range   Glucose-Capillary 109 (H) 70 - 99 mg/dL   Ct Head (brain) Wo Contrast  05/24/2014   CLINICAL DATA:  Stroke symptoms. Right-sided numbness and left-sided weakness. Fall in front yard. Bad headache yesterday. Code stroke.  EXAM: CT HEAD WITHOUT CONTRAST  TECHNIQUE: Contiguous axial images were obtained from the base of the skull through the vertex without intravenous contrast.  COMPARISON:  Brain MRI 08/04/2010.  Head CT 08/02/2010.  FINDINGS: Dilated perivascular space is again seen in the left basal ganglia. There is mild cerebral atrophy. Periventricular white-matter  hypodensities are nonspecific but compatible with minimal chronic small vessel ischemic disease. There is no evidence of acute cortical infarct, intracranial hemorrhage, mass, midline shift, or extra-axial fluid collection.  Orbits are unremarkable. Mastoid air cells and visualized paranasal sinuses are clear.  IMPRESSION: No evidence of acute intracranial abnormality.  These results were called by telephone at the time of interpretation on 05/24/2014 at 7:45 pm to Dr. Elnora Morrison , who verbally acknowledged these results.   Electronically Signed   By: Logan Bores   On: 05/24/2014 19:45    Assessment: 64 y.o. female with hyperlipidemia, previous ischemic infarct without residual deficits, complains of left leg weakness and right arm numbness. NIHSS 2 but very inconsistent exam.  CT brain without acute abnormality. Patient has been drinking, ETOH level 131, inconsistent neuro-exam with low NIHSS due to findings that doesn't fit anatomically, thus doubt an acute cerebrovascular insult and will not pursue further neurological intervention.  Stroke Risk Factors - HTN, stroke   Dorian Pod, MD [facility] [phone]  05/24/2014, 7:58 PM

## 2014-05-24 NOTE — H&P (Addendum)
[facility] Admission History and Physical       GINETTE BRADWAY IWL:[id number] DOB: 1950/01/26 DOA: 05/24/2014  Referring physician: EDP:   PCP: No PCP Per Patient  Specialists:   Chief Complaint: Simeon Craft on the Ground  HPI: Vicki [name] is a 64 y.o. female with a history of HTN, Hyperlipidemia, ? Migraine HAs, TIA in the past, and  Alcohol Abuse who was brought to the ED after being found unresponsive face-down in her front yard.   EMS was called.  When she became responsive she did not know how long she had been on the ground and did not remember what happened before she passed out.   She had last been seen at 4: 30 pm.  When her son came home he saw her on the ground in the front yard, and told his father who had been taking a nap.  The husband thinks that she was not down for more than 30  Minutes.   When she became alert she had difficulty standing due to left sided weakness.   She reports that she had been having right arm weakness and numbness and headaches off and on x 3 days.   She reports that she drank 15 ounces of Red Wine Today around 1 pm, and reports that she drinks 2-3x a week.   Her husband is at the bedside and states that it may be more.     When she arrived in the ED a Code Stroke was called, and she had a  CT scan of the head which was negative for acute findings.    She was seen by Neurologist Dr. Aram Beecham in the ED.    Her Sodium level was found to be 126.   And her initial ETOH level was 131.   She was found to have an NIHSS score of 2, and was outside of the window for TPA administration.      Review of Systems:  Constitutional: No Weight Loss, No Weight Gain, Night Sweats, Fevers, Chills, Dizziness, Light Headedness, Fatigue, or Generalized Weakness HEENT:    +Headaches, Difficulty Swallowing,Tooth/Dental Problems,Sore Throat,  No Sneezing, Rhinitis, Ear Ache, Nasal Congestion, or Post Nasal Drip,  Cardio-vascular:  No Chest pain, Orthopnea, PND,  Edema in Lower Extremities, Anasarca, Dizziness, Palpitations  Resp: No Dyspnea, No DOE, No Productive Cough, No Non-Productive Cough, No Hemoptysis, No Wheezing.    GI: No Heartburn, Indigestion, Abdominal Pain, Nausea, Vomiting, Diarrhea, Constipation, Hematemesis, Hematochezia, Melena, Change in Bowel Habits,  Loss of Appetite  GU: No Dysuria, No Change in Color of Urine, No Urgency or Urinary Frequency, No Flank pain.  Musculoskeletal: No Joint Pain or Swelling, No Decreased Range of Motion, No Back Pain.  Neurologic:  +Syncope, No Seizures, +Left sided Weakness, +Right ARM Paresthesias, Vision Disturbance or Loss, No Diplopia, No Vertigo, No Difficulty Walking,  Skin: No Rash or Lesions. Psych: No Change in Mood or Affect, No Depression or Anxiety, No Memory loss, No Confusion, or Hallucinations  Past Medical History  Diagnosis Date  . Migraines     with aura  . 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 Hyperlipidemia     Past Surgical History  Procedure Laterality Date  . Hand surgery      nerve & tendon repair  . Breast enhancement surgery    . Facial cosmetic surgery        Prior to Admission medications   Medication Sig Start Date End Date  Taking? Authorizing Provider  AMBULATORY NON FORMULARY MEDICATION Inject 150 mg into the skin every 14 (fourteen) days. Medication Name: SPIRE study drug  bococizumab vs placebo Patient taking differently: Inject 150 mg into the skin every 14 (fourteen) days. Medication Name: SPIRE study drug  bococizumab vs placebo/ inject every other Wednesday 08/21/13  Yes Hillary Bow, MD  amLODipine (NORVASC) 2.5 MG tablet Take 2.5 mg by mouth daily.   Yes Historical Provider, MD  aspirin 325 MG tablet Take 325 mg by mouth at bedtime.    Yes Historical Provider, MD  Cholecalciferol 5000 UNITS capsule Take 10,000-15,000 Units by mouth 2 (two) times daily. Usually 3 tabs in am and 2 tabs in pm   Yes Historical Provider, MD  cyanocobalamin (,VITAMIN  B-12,) 1000 MCG/ML injection Inject 1,000 mcg into the muscle every 30 (thirty) days. Last injection 05/23/2014   Yes Historical Provider, MD  dimenhyDRINATE (DRAMAMINE) 50 MG tablet Take 50 mg by mouth every 8 (eight) hours as needed for nausea.   Yes Historical Provider, MD  KRILL OIL PO Take 1 capsule by mouth daily.   Yes Historical Provider, MD  losartan (COZAAR) 100 MG tablet Take 50 mg by mouth 2 (two) times daily.    Yes Historical Provider, MD  MAGNESIUM PO Take 2 tablets by mouth daily.    Yes Historical Provider, MD  MILK THISTLE PO Take 1 tablet by mouth daily.   Yes Historical Provider, MD  naproxen sodium (ANAPROX) 220 MG tablet Take 220 mg by mouth See admin instructions. Takes 1 tab twice daily, can take addt'l tab as needed for pain   Yes Historical Provider, MD  Omega-3 Fatty Acids (FISH OIL PO) Take 1 capsule by mouth daily.   Yes Historical Provider, MD  sertraline (ZOLOFT) 100 MG tablet Take 50 mg by mouth 2 (two) times daily.   Yes Historical Provider, MD     Allergies  Allergen Reactions  . Codeine Other (See Comments)    headache  . Crestor [Rosuvastatin] Other (See Comments)    Muscle aches  . Other Nausea And Vomiting    Anesthesia-severe vomiting  . Simvastatin Other (See Comments)    Muscle aches    Social History:  reports that she has quit smoking. She does not have any smokeless tobacco history on file. She reports that she drinks about 2.4 oz of alcohol per week. She reports that she does not use illicit drugs.    Family History  Problem Relation Age of Onset  . Stroke Mother   . Stroke Father   . Depression Father   . Hypertension Brother   . Heart disease Brother     stint       Physical Exam:  GEN:  Pleasant Well Nourished and Well Developed  64 y.o. Caucasian female examined and in no acute distress; cooperative with exam Filed Vitals:   05/24/14 2052 05/24/14 2054 05/24/14 2058 05/24/14 2119  BP:   105/67 134/55  Pulse:   75 75  Temp:  98.2 F (36.8 C) 98.4 F (36.9 C)  97.7 F (36.5 C)  TempSrc:  Rectal  Oral  Resp:   23 20  Height:    5\' 2"  (1.575 m)  Weight:    79.107 kg (174 lb 6.4 oz)  SpO2:   99% 99%   Blood pressure 134/55, pulse 75, temperature 97.7 F (36.5 C), temperature source Oral, resp. rate 20, height 5\' 2"  (1.575 m), weight 79.107 kg (174 lb 6.4 oz), last menstrual  period 07/18/2011, SpO2 99 %. PSYCH: She is alert and oriented x4; does not appear anxious does not appear depressed; affect is normal HEENT: Normocephalic and Atraumatic, Mucous membranes pink; PERRLA; EOM intact; Fundi:  Benign;  No scleral icterus, Nares: Patent, Oropharynx: Clear, Fair Dentition,    Neck:  FROM, No Cervical Lymphadenopathy nor Thyromegaly or Carotid Bruit; No JVD; Breasts:: Not examined CHEST WALL: No tenderness CHEST: Normal respiration, clear to auscultation bilaterally HEART: Regular rate and rhythm; no murmurs rubs or gallops BACK: No kyphosis or scoliosis; No CVA tenderness ABDOMEN: Positive Bowel Sounds, Obese, Soft Non-Tender, No Rebound or Guarding; No Masses, No Organomegaly.    Rectal Exam: Not done EXTREMITIES: No Cyanosis, Clubbing, or Edema; No Ulcerations. Genitalia: not examined PULSES: 2+ and symmetric SKIN: Normal hydration no rash or ulceration CNS:  Alert and Oriented x 4, No Focal Deficits  Mental Status:  Alert, Oriented, Thought Content Appropriate. Speech Fluent without evidence of Aphasia.    Able to follow 3 step commands without difficulty.  In No obvious pain.    Cranial Nerves:  II: Discs flat bilaterally; Visual fields Intact, Pupils equal and reactive.     II,IV, VI: Extra-ocular motions intact bilaterally     V,VII: smile symmetric, facial light touch sensation normal bilaterally     VIII: hearing intact     IX,X: gag reflex present     XI: bilateral shoulder shrug     XII: midline tongue extension    Motor:  Right:  Upper extremity 5-/5     Left:  Upper extremity 5/5      Right:   Lower extremity 5/5    Left:  Lower extremity 5/5      Tone and Bulk:  normal tone throughout; no atrophy noted    Sensory:  Pinprick and light touch intact throughout, bilaterally    Deep Tendon Reflexes: 2+ and symmetric throughout    Plantars/ Babinski:  Right:normal Left: normal     Cerebellar:  Finger to nose without difficulty.    Gait: deferred   Vascular: pulses palpable throughout    Labs on Admission:  Basic Metabolic Panel:  Recent Labs Lab 05/24/14 1826 05/24/14 1832  NA 126* 133*  K 4.0 4.1  CL 98* 98*  CO2 19*  --   GLUCOSE 113* 116*  BUN 9 10  CREATININE 0.71 0.90  CALCIUM 8.9  --    Liver Function Tests:  Recent Labs Lab 05/24/14 1826  AST 27  ALT 22  ALKPHOS 74  BILITOT 0.6  PROT 7.0  ALBUMIN 4.0   No results for input(s): LIPASE, AMYLASE in the last 168 hours. No results for input(s): AMMONIA in the last 168 hours. CBC:  Recent Labs Lab 05/24/14 1826 05/24/14 1832  WBC 8.3  --   NEUTROABS 5.3  --   HGB 15.0 16.7*  HCT 43.2 49.0*  MCV 90.0  --   PLT 284  --    Cardiac Enzymes: No results for input(s): CKTOTAL, CKMB, CKMBINDEX, TROPONINI in the last 168 hours.  BNP (last 3 results) No results for input(s): BNP in the last 8760 hours.  ProBNP (last 3 results) No results for input(s): PROBNP in the last 8760 hours.  CBG:  Recent Labs Lab 05/24/14 1922  GLUCAP 109*    Radiological Exams on Admission: Ct Head (brain) Wo Contrast  05/24/2014   CLINICAL DATA:  Stroke symptoms. Right-sided numbness and left-sided weakness. Fall in front yard. Bad headache yesterday. Code stroke.  EXAM: CT HEAD  WITHOUT CONTRAST  TECHNIQUE: Contiguous axial images were obtained from the base of the skull through the vertex without intravenous contrast.  COMPARISON:  Brain MRI 08/04/2010.  Head CT 08/02/2010.  FINDINGS: Dilated perivascular space is again seen in the left basal ganglia. There is mild cerebral atrophy. Periventricular white-matter  hypodensities are nonspecific but compatible with minimal chronic small vessel ischemic disease. There is no evidence of acute cortical infarct, intracranial hemorrhage, mass, midline shift, or extra-axial fluid collection.  Orbits are unremarkable. Mastoid air cells and visualized paranasal sinuses are clear.  IMPRESSION: No evidence of acute intracranial abnormality.  These results were called by telephone at the time of interpretation on 05/24/2014 at 7:45 pm to Dr. Elnora Morrison , who verbally acknowledged these results.   Electronically Signed   By: Logan Bores   On: 05/24/2014 19:45     EKG: Independently reviewed. Normal Sinus Rhythm at 80 No Acute Changes   Assessment/Plan:   64 y.o. female with  Principal Problem:   1.    Hyponatremia- Na+ =126, Most likely due to ETOH Abuse, and Dehydration   IVFs with NSS   Monitor Na+ Trend   Send Urine Electrolytes and Osm to rule out SIADH   Active Problems:   2.    Left-sided weakness   CVA Protocol Initiated    MRI/MRA of Brain, Carotid US and 2 E=D ECHO ordered    Check Fasting Lipids and HbA1C in AM     3.    Right arm numbness- due to Migraine HA, or CVA   CVA Protocol Initiated     4.    Syncope and collapse- Due to CVA or Hyponatremia or Due to ETOH.      CVA Workup   Check Orthostatics     5.    Alcohol abuse/Alcohol abuse with intoxication   Monitor for Signs of Withdrawal   CIWA Protocol with PO Ativan PRN     6.    Hyperlipidemia   Checking Fasting Lipids in AM    Has Intolerance to Statins   Continue Omega 3 Fatty Acids     7.  Essential Hypertension   Discontinue Amlodipine = causes Swelling of BLEs    Change to a different Anti-Hypertensive   Continue Losartan Rx   Monitor BPs     8.    DVT Prophylaxis       Lovenox    Code Status:     FULL CODE       Family Communication:   Husband at Bedside       Disposition Plan:    Observation Status        Time spent:  [facility] Pager [phone]   If [place] Please Contact the Day Rounding Team MD for [facility]  If 7PM-7AM, Please Contact Night-Floor Coverage  [email or web address] Password [facility] 05/24/2014, 9:42 PM     ADDENDUM:   Patient was seen and examined on 05/24/2014

## 2014-05-24 NOTE — Code Documentation (Signed)
Vicki [name] is a 64yo wf presenting to the [facility] via pvt vehicle after being found on the ground by her husband and son.  Per her husband she went over to a neighbors and came back around 4pm and was walking and speaking normal at that time.  Her son came over around 1730 and found her facedown in the yard.  She was unable to walk back to the house and the family called EMS.  Upon their arrival she refused transport and her family assisted her back in the house.  Her husband was later able to convince her to come to [facility].  She admits to drinking several glasses on wine today and states that her Rt arm and entire face have been numb and tingling since early am.  The ED resident evaluated her upon arrival and found her to be weak on the LLE. A code stroke was called at that time.  Upon stroke team eval she was weak on LLE and sensory impaired on RUE.  NIH 2, inconsistent exam.

## 2014-05-24 NOTE — ED Notes (Signed)
Pt reports right arm numbness since she got up this am. Has pain to right arm when attempting to grip. Also states that left leg was weak earlier but leg weakness has improved. No facial droop noted.

## 2014-05-24 NOTE — ED Notes (Signed)
[facility] called @ 1910/code stroke activated.

## 2014-05-24 NOTE — Progress Notes (Signed)
Pt arrived to unit alert and oriented. Oriented to unit and room. Call bell at side. Will continue to monitor.

## 2014-05-25 ENCOUNTER — Observation Stay (HOSPITAL_COMMUNITY): Payer: BLUE CROSS/BLUE SHIELD

## 2014-05-25 DIAGNOSIS — F1010 Alcohol abuse, uncomplicated: Secondary | ICD-10-CM | POA: Diagnosis not present

## 2014-05-25 DIAGNOSIS — F329 Major depressive disorder, single episode, unspecified: Secondary | ICD-10-CM | POA: Diagnosis not present

## 2014-05-25 DIAGNOSIS — R55 Syncope and collapse: Secondary | ICD-10-CM | POA: Diagnosis not present

## 2014-05-25 DIAGNOSIS — I10 Essential (primary) hypertension: Secondary | ICD-10-CM | POA: Diagnosis not present

## 2014-05-25 DIAGNOSIS — F32A Depression, unspecified: Secondary | ICD-10-CM | POA: Insufficient documentation

## 2014-05-25 DIAGNOSIS — E871 Hypo-osmolality and hyponatremia: Secondary | ICD-10-CM | POA: Diagnosis not present

## 2014-05-25 LAB — BASIC METABOLIC PANEL
Anion gap: 8 (ref 5–15)
BUN: 7 mg/dL (ref 6–20)
CHLORIDE: 103 mmol/L (ref 101–111)
CO2: 24 mmol/L (ref 22–32)
CREATININE: 0.71 mg/dL (ref 0.44–1.00)
Calcium: 9.2 mg/dL (ref 8.9–10.3)
GFR calc non Af Amer: >60 mL/min (ref >60)
Glucose, Bld: 96 mg/dL (ref 70–99)
Potassium: 4.3 mmol/L (ref 3.5–5.1)
SODIUM: 135 mmol/L (ref 135–145)

## 2014-05-25 LAB — LIPID PANEL
CHOL/HDL RATIO: 6.2 ratio
Cholesterol: 235 mg/dL — ABNORMAL HIGH (ref 0–200)
HDL: 38 mg/dL — AB (ref >40)
LDL Cholesterol: 127 mg/dL — ABNORMAL HIGH (ref 0–99)
TRIGLYCERIDES: 352 mg/dL — AB (ref <150)
VLDL: 70 mg/dL — ABNORMAL HIGH (ref 0–40)

## 2014-05-25 LAB — CBC
HCT: 41.3 % (ref 36.0–46.0)
Hemoglobin: 14 g/dL (ref 12.0–15.0)
MCH: 30.8 pg (ref 26.0–34.0)
MCHC: 33.9 g/dL (ref 30.0–36.0)
MCV: 91 fL (ref 78.0–100.0)
PLATELETS: 255 10*3/uL (ref 150–400)
RBC: 4.54 MIL/uL (ref 3.87–5.11)
RDW: 13.1 % (ref 11.5–15.5)
WBC: 7.9 10*3/uL (ref 4.0–10.5)

## 2014-05-25 LAB — NA AND K (SODIUM & POTASSIUM), RAND UR
Potassium Urine: 54 mmol/L
SODIUM UR: 120 mmol/L

## 2014-05-25 LAB — OSMOLALITY, URINE: Osmolality, Ur: 421 mOsm/kg (ref 390–1090)

## 2014-05-25 MED ORDER — FISH OIL 1000 MG PO CAPS: 1000.0000 mg | ORAL_CAPSULE | Freq: Two times a day (BID) | ORAL | Status: DC

## 2014-05-25 NOTE — Evaluation (Signed)
Physical Therapy Evaluation Patient Details Name: Vicki [name] MRN: [id number] DOB: Dec 25, 1950 Today's Date: 05/25/2014   History of Present Illness  Vicki [name] is a 64 y.o. female with a history of HTN, Hyperlipidemia, ? Migraine HAs, TIA in the past, and Alcohol Abuse who was brought to the ED after being found unresponsive face-down in her front yard  Clinical Impression  Vicki [name] is moving well, reports all strength, mobility and balance as baseline. Pt however reports that she has had spinning, dizziness and nausea with onset November 2015 with daily occurrence lasting seconds. Supine head roll negative bilaterally without production of symptoms. Dix hallpike to left positive for symptoms of nausea but no spinning or nystagmus, negative to right. Treated with Epley maneuver left to right with immediate reproduction of nausea and spinning 2 seconds briefly with end position. Pt educated for vestibular therapy, repositioning sequence at home and symptoms. Pt would benefit from additional therapy to further evaluate and treat vestibular dysfunction with recommendation for OPPT if symptoms do not resolve.     Follow Up Recommendations Outpatient PT, vestibular    Equipment Recommendations  None recommended by PT    Recommendations for Other Services       Precautions / Restrictions Precautions Precautions: None      Mobility  Bed Mobility Overal bed mobility: Independent                Transfers Overall transfer level: Independent                  Ambulation/Gait Ambulation/Gait assistance: Independent Ambulation Distance (Feet): 50 Feet         General Gait Details: pt able to turn head, look around and walk without LOB, denied walking in hall  Stairs            Wheelchair Mobility    Modified Rankin (Stroke Patients Only)       Balance Overall balance assessment: No apparent balance deficits (not formally assessed)                                            Pertinent Vitals/Pain Pain Assessment: No/denies pain    Home Living Family/patient expects to be discharged to:: Private residence Living Arrangements: Spouse/significant other Available Help at Discharge: Family;Available 24 hours/day Type of Home: House Home Access: Stairs to enter   [employer] of Steps: 2 Home Layout: One level;Laundry or work area in [employer]: None      Prior Function Level of Independence: Independent         Comments: works as a Teacher, music: Right    Extremity/Trunk Assessment   Upper Extremity Assessment: Overall WFL for tasks assessed           Lower Extremity Assessment: Overall WFL for tasks assessed      Cervical / Trunk Assessment: Normal  Communication   Communication: No difficulties  Cognition Arousal/Alertness: Awake/alert Behavior During Therapy: WFL for tasks assessed/performed Overall Cognitive Status: Within Functional Limits for tasks assessed                      General Comments      Exercises        Assessment/Plan    PT Assessment Patient needs continued PT services  PT Diagnosis Other (comment) (vestibular  dysfunction)   PT Problem List Decreased activity tolerance;Other (comment) (vestibular dysfunction)  PT Treatment Interventions Functional mobility training;Therapeutic activities;Other (comment) (cannalith repositioning with vestibular treatment)   PT Goals (Current goals can be found in the Care Plan section) Acute Rehab PT Goals Patient Stated Goal: return to work PT Goal Formulation: With patient Time For Goal Achievement: 06/01/14 Potential to Achieve Goals: Good    Frequency Min 3X/week   Barriers to discharge        Co-evaluation               End of Session   Activity Tolerance: Patient tolerated treatment well Patient left: in chair;with call bell/phone within  reach      Functional Assessment Tool Used: clinical judgement Functional Limitation: Mobility: Walking and moving around Mobility: Walking and Moving Around Current Status (D7824): At least 1 percent but less than 20 percent impaired, limited or restricted Mobility: Walking and Moving Around Goal Status [phone]): 0 percent impaired, limited or restricted    Time: 1443-1540 PT Time Calculation (min) (ACUTE ONLY): 18 min   Charges:   PT Evaluation $Initial PT Evaluation Tier I: 1 Procedure     PT G Codes:   PT G-Codes **NOT FOR INPATIENT CLASS** Functional Assessment Tool Used: clinical judgement Functional Limitation: Mobility: Walking and moving around Mobility: Walking and Moving Around Current Status (G8676): At least 1 percent but less than 20 percent impaired, limited or restricted Mobility: Walking and Moving Around Goal Status [phone]): 0 percent impaired, limited or restricted    Melford Aase 05/25/2014, 12:10 PM Elwyn Reach, [place]

## 2014-05-25 NOTE — Discharge Instructions (Signed)
Transient Ischemic Attack  A transient ischemic attack (TIA) is a "warning stroke" that causes stroke-like symptoms. Unlike a stroke, a TIA does not cause permanent damage to the brain. The symptoms of a TIA can happen very fast and do not last long. It is important to know the symptoms of a TIA and what to do. This can help prevent a major stroke or death.  CAUSES   · A TIA is caused by a temporary blockage in an artery in the brain or neck (carotid artery). The blockage does not allow the brain to get the blood supply it needs and can cause different symptoms. The blockage can be caused by either:  ¨ A blood clot.  ¨ Fatty buildup (plaque) in a neck or brain artery.  RISK FACTORS  · High blood pressure (hypertension).  · High cholesterol.  · Diabetes mellitus.  · Heart disease.  · The build up of plaque in the blood vessels (peripheral artery disease or atherosclerosis).  · The build up of plaque in the blood vessels providing blood and oxygen to the brain (carotid artery stenosis).  · An abnormal heart rhythm (atrial fibrillation).  · Obesity.  · Smoking.  · Taking oral contraceptives (especially in combination with smoking).  · Physical inactivity.  · A diet high in fats, salt (sodium), and calories.  · Alcohol use.  · Use of illegal drugs (especially cocaine and methamphetamine).  · Being female.  · Being African American.  · Being over the age of 55.  · Family history of stroke.  · Previous history of blood clots, stroke, TIA, or heart attack.  · Sickle cell disease.  SYMPTOMS   TIA symptoms are the same as a stroke but are temporary. These symptoms usually develop suddenly, or may be newly present upon awakening from sleep:  · Sudden weakness or numbness of the face, arm, or leg, especially on one side of the body.  · Sudden trouble walking or difficulty moving arms or legs.  · Sudden confusion.  · Sudden personality changes.  · Trouble speaking (aphasia) or understanding.  · Difficulty swallowing.  · Sudden  trouble seeing in one or both eyes.  · Double vision.  · Dizziness.  · Loss of balance or coordination.  · Sudden severe headache with no known cause.  · Trouble reading or writing.  · Loss of bowel or bladder control.  · Loss of consciousness.  DIAGNOSIS   Your caregiver may be able to determine the presence or absence of a TIA based on your symptoms, history, and physical exam. Computed tomography (CT scan) of the brain is usually performed to help identify a TIA. Other tests may be done to diagnose a TIA. These tests may include:  · Electrocardiography.  · Continuous heart monitoring.  · Echocardiography.  · Carotid ultrasonography.  · Magnetic resonance imaging (MRI).  · A scan of the brain circulation.  · Blood tests.  PREVENTION   The risk of a TIA can be decreased by appropriately treating high blood pressure, high cholesterol, diabetes, heart disease, and obesity and by quitting smoking, limiting alcohol, and staying physically active.  TREATMENT   Time is of the essence. Since the symptoms of TIA are the same as a stroke, it is important to seek treatment as soon as possible because you may need a medicine to dissolve the clot (thrombolytic) that cannot be given if too much time has passed. Treatment options vary. Treatment options may include rest, oxygen, intravenous (IV) fluids,   and medicines to thin the blood (anticoagulants). Medicines and diet may be used to address diabetes, high blood pressure, and other risk factors. Measures will be taken to prevent short-term and long-term complications, including infection from breathing foreign material into the lungs (aspiration pneumonia), blood clots in the legs, and falls. Treatment options include procedures to either remove plaque in the carotid arteries or dilate carotid arteries that have narrowed due to plaque. Those procedures are:  · Carotid endarterectomy.  · Carotid angioplasty and stenting.  HOME CARE INSTRUCTIONS   · Take all medicines prescribed  by your caregiver. Follow the directions carefully. Medicines may be used to control risk factors for a stroke. Be sure you understand all your medicine instructions.  · You may be told to take aspirin or the anticoagulant warfarin. Warfarin needs to be taken exactly as instructed.  ¨ Taking too much or too little warfarin is dangerous. Too much warfarin increases the risk of bleeding. Too little warfarin continues to allow the risk for blood clots. While taking warfarin, you will need to have regular blood tests to measure your blood clotting time. A PT blood test measures how long it takes for blood to clot. Your PT is used to calculate another value called an INR. Your PT and INR help your caregiver to adjust your dose of warfarin. The dose can change for many reasons. It is critically important that you take warfarin exactly as prescribed.  ¨ Many foods, especially foods high in vitamin K can interfere with warfarin and affect the PT and INR. Foods high in vitamin K include spinach, kale, broccoli, cabbage, collard and turnip greens, brussels sprouts, peas, cauliflower, seaweed, and parsley as well as beef and pork liver, green tea, and soybean oil. You should eat a consistent amount of foods high in vitamin K. Avoid major changes in your diet, or notify your caregiver before changing your diet. Arrange a visit with a dietitian to answer your questions.  ¨ Many medicines can interfere with warfarin and affect the PT and INR. You must tell your caregiver about any and all medicines you take, this includes all vitamins and supplements. Be especially cautious with aspirin and anti-inflammatory medicines. Do not take or discontinue any prescribed or over-the-counter medicine except on the advice of your caregiver or pharmacist.  ¨ Warfarin can have side effects, such as excessive bruising or bleeding. You will need to hold pressure over cuts for longer than usual. Your caregiver or pharmacist will discuss other  potential side effects.  ¨ Avoid sports or activities that may cause injury or bleeding.  ¨ Be mindful when shaving, flossing your teeth, or handling sharp objects.  ¨ Alcohol can change the body's ability to handle warfarin. It is best to avoid alcoholic drinks or consume only very small amounts while taking warfarin. Notify your caregiver if you change your alcohol intake.  ¨ Notify your dentist or other caregivers before procedures.  · Eat a diet that includes 5 or more servings of fruits and vegetables each day. This may reduce the risk of stroke. Certain diets may be prescribed to address high blood pressure, high cholesterol, diabetes, or obesity.  ¨ A low-sodium, low-saturated fat, low-trans fat, low-cholesterol diet is recommended to manage high blood pressure.  ¨ A low-saturated fat, low-trans fat, low-cholesterol, and high-fiber diet may control cholesterol levels.  ¨ A controlled-carbohydrate, controlled-sugar diet is recommended to manage diabetes.  ¨ A reduced-calorie, low-sodium, low-saturated fat, low-trans fat, low-cholesterol diet is recommended to manage obesity.  ·   Maintain a healthy weight.  · Stay physically active. It is recommended that you get at least 30 minutes of activity on most or all days.  · Do not smoke.  · Limit alcohol use even if you are not taking warfarin. Moderate alcohol use is considered to be:  ¨ No more than 2 drinks each day for men.  ¨ No more than 1 drink each day for nonpregnant women.  · Stop drug abuse.  · Home safety. A safe home environment is important to reduce the risk of falls. Your caregiver may arrange for specialists to evaluate your home. Having grab bars in the bedroom and bathroom is often important. Your caregiver may arrange for equipment to be used at home, such as raised toilets and a seat for the shower.  · Follow all instructions for follow-up with your caregiver. This is very important. This includes any referrals and lab tests. Proper follow up can  prevent a stroke or another TIA from occurring.  SEEK MEDICAL CARE IF:  · You have personality changes.  · You have difficulty swallowing.  · You are seeing double.  · You have dizziness.  · You have a fever.  · You have skin breakdown.  SEEK IMMEDIATE MEDICAL CARE IF:   Any of these symptoms may represent a serious problem that is an emergency. Do not wait to see if the symptoms will go away. Get medical help right away. Call your local emergency services (911 in [place] Do not drive yourself to the hospital.  · You have sudden weakness or numbness of the face, arm, or leg, especially on one side of the body.  · You have sudden trouble walking or difficulty moving arms or legs.  · You have sudden confusion.  · You have trouble speaking (aphasia) or understanding.  · You have sudden trouble seeing in one or both eyes.  · You have a loss of balance or coordination.  · You have a sudden, severe headache with no known cause.  · You have new chest pain or an irregular heartbeat.  · You have a partial or total loss of consciousness.  MAKE SURE YOU:   · Understand these instructions.  · Will watch your condition.  · Will get help right away if you are not doing well or get worse.  Document Released: 10/13/2004 Document Revised: 01/08/2013 Document Reviewed: 04/10/2013  [employer]® Patient Information ©2015 [employer]. This information is not intended to replace advice given to you by your health care provider. Make sure you discuss any questions you have with your health care provider.

## 2014-05-25 NOTE — Progress Notes (Signed)
  Echocardiogram 2D Echocardiogram has been performed.  Vicki [name] 05/25/2014, 8:49 AM

## 2014-05-25 NOTE — Progress Notes (Signed)
UR completed 

## 2014-05-25 NOTE — Discharge Summary (Signed)
Physician Discharge Summary  Vicki [name] RFF:[id number] DOB: September 12, 1950 DOA: 05/24/2014  PCP: Dr. Carol Ada of [facility] [address]   Admit date: 05/24/2014 Discharge date: 05/25/2014  Recommendations for Outpatient Follow-up:  1. Check CBC and BMP with next sch appt with PCP 2. Omegas 3 increased to BID instead of Q daily regimen.   Discharge Diagnoses:  Principal Problem:   Hyponatremia Active Problems:   Hyperlipidemia   Left-sided weakness   Right arm numbness   Alcohol abuse   Alcohol abuse with intoxication   Syncope and collapse   CVA (cerebral infarction)   Essential hypertension    Discharge Condition: stable; insists on going home today   Diet recommendation: as tolerated   History of present illness:  64 y.o. female with a past medical history of alcohol abuse, hypertension, migraine headache, depression, dyslipidemia who presented to [facility] ED after she was found unresponsive on the floor on the front yard. She was found on the floor by her son. Pt does not recall details of the events that led to passing out.  Of note, pt drinks wine few times a week but her husband claims it is probably even more.   In ED, code stroke was called because pt reported right arm numbness and left leg wewakness. CT head did not show acute intracranial findings. Her alcohol level was 131, normal troponin level, sodium 126. She has been seen by neurology in consultation. CT head and MRI brain all negative for acute intracranial findings.    Hospital Course:   Principal Problem: Right arm numbness and left leg weakness secondary to TIA - Right arm numbness and left leg weakness all resolved - Stroke / TIA work up:   Aspirin daily on discharge  MRI brain / MRA brain - no acute intracranial findings  2D ECHO - pending  Carotid doppler - pending  HgbA1c - pending  Lipid panel - LDL 127 VTE prophylaxis: SCD's bilaterally  Diet: regular  Therapy: PT/OT - pt declined PT  eval  Hyperlipidemia  LDL ; LDL goal < 100  Patient on Omega 3 supplementation which we will increase to BID frequency. She is apparently allergic to stains. Other Stroke Risk Factors  Advanced age Hx TIA  EtOH use   Active Problems:   Hyponatremia - Likely due to alcohol abuse - Sodium normalized with IV fluids     Hyperlipidemia - Increase omega 3 to BID instead of Q daily regimen     Alcohol abuse with intoxication - Alcohol lvel 131 on admission - Was on CIWA since admission - No reports of withdrawals     Syncope and collapse - Due to acute alcohol intoxication - Now better and declines PT eval    Essential hypertension - Resume Norvasc and losartan - BP 132/78    Depression - Stable - Continue sertraline   Signed:  Leisa Lenz, MD  [facility] 05/25/2014, 10:52 AM  Pager #: [phone]  Time spent in minutes: less than 30 minutes  Procedures:  2 D ECHO  Consultations:  None   Discharge Exam: Filed Vitals:   05/25/14 0927  BP: 132/78  Pulse: 76  Temp: 97.3 F (36.3 C)  Resp: 20   Filed Vitals:   05/25/14 0126 05/25/14 0332 05/25/14 0524 05/25/14 0927  BP: 127/56 121/65 127/66 132/78  Pulse: 66 65 65 76  Temp: 98.2 F (36.8 C) 98.2 F (36.8 C) 98.3 F (36.8 C) 97.3 F (36.3 C)  TempSrc: Oral Oral Oral  Oral  Resp: 17 18 19 20   Height:      Weight:      SpO2: 97% 96% 100% 98%    General: Pt is alert, follows commands appropriately, not in acute distress Cardiovascular: Regular rate and rhythm, S1/S2 +, no murmurs Respiratory: Clear to auscultation bilaterally, no wheezing, no crackles, no rhonchi Abdominal: Soft, non tender, non distended, bowel sounds +, no guarding Extremities: no edema, no cyanosis, pulses palpable bilaterally DP and PT Neuro: Grossly nonfocal  Discharge Instructions    Medication List    TAKE these medications        AMBULATORY NON FORMULARY MEDICATION  Inject 150 mg into the skin every 14  (fourteen) days. Medication Name: SPIRE study drug  bococizumab vs placebo     amLODipine 2.5 MG tablet  Commonly known as:  NORVASC  Take 2.5 mg by mouth daily.     aspirin 325 MG tablet  Take 325 mg by mouth at bedtime.     Cholecalciferol 5000 UNITS capsule  Take 10,000-15,000 Units by mouth 2 (two) times daily. Usually 3 tabs in am and 2 tabs in pm     cyanocobalamin 1000 MCG/ML injection  Commonly known as:  (VITAMIN B-12)  Inject 1,000 mcg into the muscle every 30 (thirty) days. Last injection 05/23/2014     dimenhyDRINATE 50 MG tablet  Commonly known as:  DRAMAMINE  Take 50 mg by mouth every 8 (eight) hours as needed for nausea.     Fish Oil 1000 MG Caps  Take 1 capsule (1,000 mg total) by mouth 2 (two) times daily.     KRILL OIL PO  Take 1 capsule by mouth daily.     losartan 100 MG tablet  Commonly known as:  COZAAR  Take 50 mg by mouth 2 (two) times daily.     MAGNESIUM PO  Take 2 tablets by mouth daily.     MILK THISTLE PO  Take 1 tablet by mouth daily.     naproxen sodium 220 MG tablet  Commonly known as:  ANAPROX  Take 220 mg by mouth See admin instructions. Takes 1 tab twice daily, can take addt'l tab as needed for pain     sertraline 100 MG tablet  Commonly known as:  ZOLOFT  Take 50 mg by mouth 2 (two) times daily.            Follow-up Information    Follow up with Reginia Naas, MD. Schedule an appointment as soon as possible for a visit in 2 weeks.   Specialty:  Family Medicine   Why:  Follow up appt after recent hospitalization   Contact information:   [address] [place] [address] [phone]        The results of significant diagnostics from this hospitalization (including imaging, microbiology, ancillary and laboratory) are listed below for reference.    Significant Diagnostic Studies: Ct Head (brain) Wo Contrast  05/24/2014   CLINICAL DATA:  Stroke symptoms. Right-sided numbness and left-sided weakness.  Fall in front yard. Bad headache yesterday. Code stroke.  EXAM: CT HEAD WITHOUT CONTRAST  TECHNIQUE: Contiguous axial images were obtained from the base of the skull through the vertex without intravenous contrast.  COMPARISON:  Brain MRI 08/04/2010.  Head CT 08/02/2010.  FINDINGS: Dilated perivascular space is again seen in the left basal ganglia. There is mild cerebral atrophy. Periventricular white-matter hypodensities are nonspecific but compatible with minimal chronic small vessel ischemic disease. There is no evidence of  acute cortical infarct, intracranial hemorrhage, mass, midline shift, or extra-axial fluid collection.  Orbits are unremarkable. Mastoid air cells and visualized paranasal sinuses are clear.  IMPRESSION: No evidence of acute intracranial abnormality.  These results were called by telephone at the time of interpretation on 05/24/2014 at 7:45 pm to Dr. Elnora Morrison , who verbally acknowledged these results.   Electronically Signed   By: Logan Bores   On: 05/24/2014 19:45   Mr Brain Wo Contrast  05/25/2014   CLINICAL DATA:  64 year old female, found to home unresponsive in front yd. Right side numbness, left side weakness. Code stroke. Initial encounter.  EXAM: MRI HEAD WITHOUT CONTRAST  MRA HEAD WITHOUT CONTRAST  TECHNIQUE: Multiplanar, multiecho pulse sequences of the brain and surrounding structures were obtained without intravenous contrast. Angiographic images of the head were obtained using MRA technique without contrast.  COMPARISON:  Head CT without contrast 05/24/2014. Brain MRI and MRA 08/04/2010.  FINDINGS: MRI HEAD FINDINGS  Major intracranial vascular flow voids are stable. No restricted diffusion to suggest acute infarction. No midline shift, mass effect, evidence of mass lesion, ventriculomegaly, extra-axial collection or acute intracranial hemorrhage. Cervicomedullary junction and pituitary are within normal limits. Cerebral volume remains normal for age. Stable gray and white  matter signal, within normal limits for age. No cortical encephalomalacia or chronic blood products identified.  Visible internal auditory structures appear normal. Visualized paranasal sinuses and mastoids are clear. Visualized orbit soft tissues are within normal limits. Visualized scalp soft tissues are within normal limits. Negative visualized cervical spine. Normal bone marrow signal.  MRA HEAD FINDINGS  Stable antegrade flow in the posterior circulation with dominant distal right vertebral artery. Normal left PICA. Stable basilar artery without stenosis. Dominant right AICA. SCA and PCA origins remain normal. Bilateral PCA branches are within normal limits. Left posterior communicating artery re - identified, the right is diminutive or absent.  Antegrade flow in both ICA siphons is stable. Tortuous distal cervical ICAs again noted. No siphon stenosis. Early takeoff of the right ophthalmic artery re - identified, normal variant. Normal left ophthalmic and posterior communicating artery origins. Normal carotid termini, MCA and ACA origins. Anterior communicating artery and visualized ACA branches are within normal limits. Visualized bilateral MCA branches are stable and within normal limits.  IMPRESSION: 1.  No acute intracranial abnormality. 2. Stable since 2012 and negative for age noncontrast brain MRI and intracranial MRA.   Electronically Signed   By: Genevie Ann M.D.   On: 05/25/2014 07:46   Mr Mra Head/brain Wo Cm  05/25/2014   CLINICAL DATA:  64 year old female, found to home unresponsive in front yd. Right side numbness, left side weakness. Code stroke. Initial encounter.  EXAM: MRI HEAD WITHOUT CONTRAST  MRA HEAD WITHOUT CONTRAST  TECHNIQUE: Multiplanar, multiecho pulse sequences of the brain and surrounding structures were obtained without intravenous contrast. Angiographic images of the head were obtained using MRA technique without contrast.  COMPARISON:  Head CT without contrast 05/24/2014. Brain MRI  and MRA 08/04/2010.  FINDINGS: MRI HEAD FINDINGS  Major intracranial vascular flow voids are stable. No restricted diffusion to suggest acute infarction. No midline shift, mass effect, evidence of mass lesion, ventriculomegaly, extra-axial collection or acute intracranial hemorrhage. Cervicomedullary junction and pituitary are within normal limits. Cerebral volume remains normal for age. Stable gray and white matter signal, within normal limits for age. No cortical encephalomalacia or chronic blood products identified.  Visible internal auditory structures appear normal. Visualized paranasal sinuses and mastoids are clear. Visualized orbit soft  tissues are within normal limits. Visualized scalp soft tissues are within normal limits. Negative visualized cervical spine. Normal bone marrow signal.  MRA HEAD FINDINGS  Stable antegrade flow in the posterior circulation with dominant distal right vertebral artery. Normal left PICA. Stable basilar artery without stenosis. Dominant right AICA. SCA and PCA origins remain normal. Bilateral PCA branches are within normal limits. Left posterior communicating artery re - identified, the right is diminutive or absent.  Antegrade flow in both ICA siphons is stable. Tortuous distal cervical ICAs again noted. No siphon stenosis. Early takeoff of the right ophthalmic artery re - identified, normal variant. Normal left ophthalmic and posterior communicating artery origins. Normal carotid termini, MCA and ACA origins. Anterior communicating artery and visualized ACA branches are within normal limits. Visualized bilateral MCA branches are stable and within normal limits.  IMPRESSION: 1.  No acute intracranial abnormality. 2. Stable since 2012 and negative for age noncontrast brain MRI and intracranial MRA.   Electronically Signed   By: Genevie Ann M.D.   On: 05/25/2014 07:46    Microbiology: No results found for this or any previous visit (from the past 240 hour(s)).   Labs: Basic  Metabolic Panel:  Recent Labs Lab 05/24/14 1826 05/24/14 1832 05/25/14 0535  NA 126* 133* 135  K 4.0 4.1 4.3  CL 98* 98* 103  CO2 19*  --  24  GLUCOSE 113* 116* 96  BUN 9 10 7   CREATININE 0.71 0.90 0.71  CALCIUM 8.9  --  9.2   Liver Function Tests:  Recent Labs Lab 05/24/14 1826  AST 27  ALT 22  ALKPHOS 74  BILITOT 0.6  PROT 7.0  ALBUMIN 4.0   No results for input(s): LIPASE, AMYLASE in the last 168 hours. No results for input(s): AMMONIA in the last 168 hours. CBC:  Recent Labs Lab 05/24/14 1826 05/24/14 1832 05/25/14 0535  WBC 8.3  --  7.9  NEUTROABS 5.3  --   --   HGB 15.0 16.7* 14.0  HCT 43.2 49.0* 41.3  MCV 90.0  --  91.0  PLT 284  --  255   Cardiac Enzymes: No results for input(s): CKTOTAL, CKMB, CKMBINDEX, TROPONINI in the last 168 hours. BNP: BNP (last 3 results) No results for input(s): BNP in the last 8760 hours.  ProBNP (last 3 results) No results for input(s): PROBNP in the last 8760 hours.  CBG:  Recent Labs Lab 05/24/14 1922  GLUCAP 109*

## 2014-05-25 NOTE — Evaluation (Signed)
Speech Language Pathology Evaluation Patient Details Name: Vicki [name] MRN: [id number] DOB: [birth year] Today's Date: 05/25/2014 Time: 4132-4401 SLP Time Calculation (min) (ACUTE ONLY): 20 min  Problem List:  Patient Active Problem List   Diagnosis Date Noted  . Left-sided weakness 05/24/2014  . Hyponatremia 05/24/2014  . Right arm numbness 05/24/2014  . Alcohol abuse 05/24/2014  . Alcohol abuse with intoxication 05/24/2014  . Syncope and collapse 05/24/2014  . CVA (cerebral infarction)   . Essential hypertension   . Hyperlipidemia 04/04/2013   Past Medical History:  Past Medical History  Diagnosis Date  . Migraines     with aura  . 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 Hyperlipidemia    Past Surgical History:  Past Surgical History  Procedure Laterality Date  . Hand surgery      nerve & tendon repair  . Breast enhancement surgery    . Facial cosmetic surgery     HPI:  Vicki [name] is a 64 y.o. female with a history of HTN, Hyperlipidemia, ? Migraine HAs, TIA in the past, and Alcohol Abuse who was brought to the ED after being found unresponsive face-down in her front yard. EMS was called. When she became responsive she did not know how long she had been on the ground and did not remember what happened before she passed out. She had last been seen at 4: 30 pm. When her son came home he saw her on the ground in the front yard, and told his father who had been taking a nap. The husband thinks that she was not down for more than 30 Minutes. When she became alert she had difficulty standing due to left sided weakness. She reports that she had been having right arm weakness and numbness and headaches off and on x 3 days. She reports that she drank 15 ounces of Red Wine Today around 1 pm, and reports that she drinks 2-3x a week. Her husband is at the bedside and states that it may be more.When she arrived in the ED a Code Stroke was called, and she had a CT  scan of the head which was negative for acute findings. She was seen by Neurologist Dr. Aram Beecham in the ED. Her Sodium level was found to be 126. And her initial ETOH level was 131. She was found to have an NIHSS score of 2, and was outside of the window for TPA administration. Most recent MRI is showing nothing acute.     Assessment / Plan / Recommendation Clinical Impression  The patient's cognitive skills were assessed using the Mini Mental.  The patient scored a 29/30 indicating that her cognitive skills are within functional limits.  Oral/motor exam does not reveal any deficits.  Motor speech skills are within functional limits.  The patient does not present with any acute ST needs.  ST f/u is not indicated.      SLP Assessment  Patient does not need any further Speech Lanaguage Pathology Services    Follow Up Recommendations  None       Pertinent Vitals/Pain Pain Assessment: No/denies pain   SLP Goals  Patient/Family Stated Goal: none stated  SLP Evaluation Prior Functioning  Cognitive/Linguistic Baseline: Within functional limits Type of Home: House  Lives With: Spouse Available Help at Discharge: Family   Cognition  Overall Cognitive Status: Within Functional Limits for tasks assessed Arousal/Alertness: Awake/alert Orientation Level: Oriented X4 Attention: Focused Focused Attention: Appears intact Memory: Appears intact  Awareness: Appears intact Problem Solving: Appears intact Safety/Judgment: Appears intact    Comprehension  Auditory Comprehension Overall Auditory Comprehension: Appears within functional limits for tasks assessed Commands: Within Functional Limits Conversation: Complex Reading Comprehension Reading Status: Within funtional limits    Expression Expression Primary Mode of Expression: Verbal Verbal Expression Overall Verbal Expression: Appears within functional limits for tasks assessed Initiation: No impairment Automatic Speech:  Name;Social Response Level of Generative/Spontaneous Verbalization: Conversation Repetition: No impairment Naming: No impairment Pragmatics: No impairment Non-Verbal Means of Communication: Not applicable Written Expression Dominant Hand: Right Written Expression: Within Functional Limits   Oral / Motor Oral Motor/Sensory Function Overall Oral Motor/Sensory Function: Appears within functional limits for tasks assessed Labial ROM: Within Functional Limits Labial Symmetry: Within Functional Limits Labial Strength: Within Functional Limits Labial Sensation: Within Functional Limits Lingual ROM: Within Functional Limits Lingual Symmetry: Within Functional Limits Lingual Strength: Within Functional Limits Lingual Sensation: Within Functional Limits Facial ROM: Within Functional Limits Facial Symmetry: Within Functional Limits Facial Strength: Within Functional Limits Facial Sensation: Within Functional Limits Mandible: Within Functional Limits Motor Speech Overall Motor Speech: Appears within functional limits for tasks assessed Respiration: Within functional limits Phonation: Normal Resonance: Within functional limits Articulation: Within functional limitis Intelligibility: Intelligible Motor Planning: Witnin functional limits Motor Speech Errors: Not applicable   GO     Lamar Sprinkles 05/25/2014, 10:19 AM Shelly Flatten, [place], [place] [facility] SLP [phone]

## 2014-05-25 NOTE — Progress Notes (Signed)
Patient ready for discharge to home; discharge instructions given and reviewed; Rx given and reviewed; husband at bedside to accompany patient home; discharge out via wheelchair.

## 2014-05-26 LAB — HEMOGLOBIN A1C
HEMOGLOBIN A1C: 5.2 % (ref 4.8–5.6)
Mean Plasma Glucose: 103 mg/dL

## 2014-09-03 ENCOUNTER — Ambulatory Visit (INDEPENDENT_AMBULATORY_CARE_PROVIDER_SITE_OTHER): Payer: BLUE CROSS/BLUE SHIELD | Admitting: Certified Nurse Midwife

## 2014-09-03 ENCOUNTER — Encounter: Payer: Self-pay | Admitting: Certified Nurse Midwife

## 2014-09-03 VITALS — BP 104/70 | HR 80 | Resp 16 | Ht 61.5 in | Wt 171.0 lb

## 2014-09-03 DIAGNOSIS — Z124 Encounter for screening for malignant neoplasm of cervix: Secondary | ICD-10-CM | POA: Diagnosis not present

## 2014-09-03 DIAGNOSIS — Z01419 Encounter for gynecological examination (general) (routine) without abnormal findings: Secondary | ICD-10-CM | POA: Diagnosis not present

## 2014-09-03 DIAGNOSIS — Z1211 Encounter for screening for malignant neoplasm of colon: Secondary | ICD-10-CM

## 2014-09-03 NOTE — Patient Instructions (Signed)

## 2014-09-03 NOTE — Progress Notes (Signed)
64 y.o. G44P3003 Married  Caucasian Fe here for annual exam.  Menopausal no HRT. Denies vaginal bleeding or vaginal dryness. Seeing PCP for hypertension, depression and anxiety, and cholesterol.Has also started on Trazodone for sleep issues.  Patient has stopped working since her ? TIA or stroke in 5/16. No other medication changes. She has stopped with hair stylist work now. Patient plans on counseling to help with family issues. No other health issues today.  Patient's last menstrual period was 07/18/2011.          Sexually active: Yes.    The current method of family planning is post menopausal status.    Exercising: No.  The patient does not participate in regular exercise at present. Smoker:  no  Health Maintenance: Pap:  05/15/13 Neg. HR HPV:neg MMG:  05/2011 Normal - per pt Self Breast exam: yes, once a month Colonoscopy:  Never BMD:  1990's  TDaP:  ? Labs: PCP   reports that she has quit smoking. She has never used smokeless tobacco. She reports that she drinks about 2.4 oz of alcohol per week. She reports that she does not use illicit drugs.  Past Medical History  Diagnosis Date  . Migraines     with aura  . Mini stroke 05/2014    with paralysis for 1 hr  . Hyperlipidemia   . Anxiety   . Alcohol intoxication 05/2014     ED note     Past Surgical History  Procedure Laterality Date  . Hand surgery      nerve & tendon repair  . Breast enhancement surgery    . Facial cosmetic surgery      Current Outpatient Prescriptions  Medication Sig Dispense Refill  . AMBULATORY NON FORMULARY MEDICATION Inject 150 mg into the skin every 14 (fourteen) days. Medication Name: SPIRE study drug  bococizumab vs placebo (Patient taking differently: Inject 150 mg into the skin every 14 (fourteen) days. Medication Name: SPIRE study drug  bococizumab vs placebo/ inject every other Wednesday)    . amLODipine (NORVASC) 2.5 MG tablet Take 2.5 mg by mouth daily.    Marland Kitchen aspirin 325 MG tablet Take 325 mg  by mouth at bedtime.     . Cholecalciferol 5000 UNITS capsule Take 10,000-15,000 Units by mouth 2 (two) times daily. Usually 3 tabs in am and 2 tabs in pm    . citalopram (CELEXA) 10 MG tablet Take 10 mg by mouth daily.    . cyanocobalamin (,VITAMIN B-12,) 1000 MCG/ML injection Inject 1,000 mcg into the muscle every 30 (thirty) days. Last injection 05/23/2014    . dimenhyDRINATE (DRAMAMINE) 50 MG tablet Take 50 mg by mouth every 8 (eight) hours as needed for nausea.    Marland Kitchen KRILL OIL PO Take 1 capsule by mouth daily.    Marland Kitchen losartan (COZAAR) 100 MG tablet Take 100 mg by mouth daily.     Marland Kitchen MAGNESIUM PO Take 2 tablets by mouth daily.     Marland Kitchen MILK THISTLE PO Take 1 tablet by mouth daily.    . naproxen sodium (ANAPROX) 220 MG tablet Take 220 mg by mouth See admin instructions. Takes 1 tab twice daily, can take addt'l tab as needed for pain    . Omega-3 Fatty Acids (FISH OIL) 1000 MG CAPS Take 1 capsule (1,000 mg total) by mouth 2 (two) times daily. 60 capsule 0  . sertraline (ZOLOFT) 100 MG tablet Take 50 mg by mouth 2 (two) times daily.    . traZODone (DESYREL) 100  MG tablet Take 100 mg by mouth at bedtime.     No current facility-administered medications for this visit.    Family History  Problem Relation Age of Onset  . Stroke Mother   . Stroke Father   . Depression Father   . Hypertension Brother   . Heart disease Brother     stint    ROS:  Pertinent items are noted in HPI.  Otherwise, a comprehensive ROS was negative.  Exam:   BP 104/70 mmHg  Pulse 80  Resp 16  Ht 5' 1.5" (1.562 m)  Wt 171 lb (77.565 kg)  BMI 31.79 kg/m2  LMP 07/18/2011 Height: 5' 1.5" (156.2 cm) Ht Readings from Last 3 Encounters:  09/03/14 5' 1.5" (1.562 m)  05/24/14 5\' 2"  (1.575 m)  05/15/13 5' 1.75" (1.568 m)    General appearance: alert, cooperative and appears stated age Head: Normocephalic, without obvious abnormality, atraumatic Neck: no adenopathy, supple, symmetrical, trachea midline and thyroid normal  to inspection and palpation Lungs: clear to auscultation bilaterally Breasts: normal appearance, no masses or tenderness, No nipple retraction or dimpling, No nipple discharge or bleeding, No axillary or supraclavicular adenopathy Heart: regular rate and rhythm Abdomen: soft, non-tender; no masses,  no organomegaly Extremities: extremities normal, atraumatic, no cyanosis or edema Skin: Skin color, texture, turgor normal. No rashes or lesions Lymph nodes: Cervical, supraclavicular, and axillary nodes normal. No abnormal inguinal nodes palpated Neurologic: Grossly normal   Pelvic: External genitalia:  no lesions              Urethra:  normal appearing urethra with no masses, tenderness or lesions              Bartholin's and Skene's: normal                 Vagina: normal appearing vagina with normal color and discharge, no lesions              Cervix: normal,non tender              Pap taken: No. Bimanual Exam:  Uterus:  normal size, contour, position, consistency, mobility, non-tender              Adnexa: normal adnexa               Rectovaginal: Confirms               Anus:  normal sphincter tone, no lesions  Chaperone present: Yes  A:  Well Woman with normal exam  Menopausal no HRT  Hypertension  Alcohol abuse  TIA recent with no residual numbness  Social stress with family  P:   Reviewed health and wellness pertinent to exam  Aware of need to evaluate if vaginal bleeding  Continue follow up with PCP as indicated regarding other health issues.  Encouraged to seek counseling to help with social issues and seek friend and church support.  Pap smear as above   counseled on breast self exam, adequate intake of calcium and vitamin D, diet and exercise  return annually or prn  An After Visit Summary was printed and given to the patient.

## 2014-09-04 NOTE — Progress Notes (Signed)
Reviewed personally.  M. Suzanne Kienna Moncada, MD.  

## 2014-09-05 LAB — IPS PAP TEST WITH REFLEX TO HPV

## 2014-09-11 ENCOUNTER — Telehealth: Payer: Self-pay | Admitting: Certified Nurse Midwife

## 2014-09-11 NOTE — Telephone Encounter (Signed)
Patient need an order for bone density scan faxed to [facility].

## 2014-09-11 NOTE — Telephone Encounter (Signed)
Order for BMD to Regina Eck CNM for review and signature for fax to [facility].

## 2014-10-01 LAB — FECAL OCCULT BLOOD, IMMUNOCHEMICAL: IMMUNOLOGICAL FECAL OCCULT BLOOD TEST: NEGATIVE

## 2014-10-01 NOTE — Addendum Note (Signed)
Addended by: Graylon Good on: 10/01/2014 08:56 AM   Modules accepted: Orders, SmartSet

## 2014-10-22 ENCOUNTER — Telehealth: Payer: Self-pay

## 2014-10-22 NOTE — Telephone Encounter (Signed)
Pt called to give her BMD results. Results to scanned in. Pt notified. appt scheduled with dr Talbert Nan 11-05-14

## 2014-10-28 ENCOUNTER — Telehealth: Payer: Self-pay | Admitting: Obstetrics and Gynecology

## 2014-10-28 NOTE — Telephone Encounter (Signed)
Reviewed patients DEXA. Lowest T score is in the left hip, -1.6 FRAX risk is 2.2% in the hip and 18.6% overall   12.8% decrease of bone density in her spine since 11/01.  The patient has an appointment to come in and discuss her DEXA next week.

## 2014-11-05 ENCOUNTER — Encounter: Payer: Self-pay | Admitting: Obstetrics and Gynecology

## 2014-11-05 ENCOUNTER — Ambulatory Visit (INDEPENDENT_AMBULATORY_CARE_PROVIDER_SITE_OTHER): Payer: BLUE CROSS/BLUE SHIELD | Admitting: Obstetrics and Gynecology

## 2014-11-05 VITALS — BP 116/76 | HR 92 | Resp 18 | Wt 170.0 lb

## 2014-11-05 DIAGNOSIS — F329 Major depressive disorder, single episode, unspecified: Secondary | ICD-10-CM | POA: Diagnosis not present

## 2014-11-05 DIAGNOSIS — M8580 Other specified disorders of bone density and structure, unspecified site: Secondary | ICD-10-CM | POA: Diagnosis not present

## 2014-11-05 DIAGNOSIS — F32A Depression, unspecified: Secondary | ICD-10-CM

## 2014-11-05 DIAGNOSIS — R61 Generalized hyperhidrosis: Secondary | ICD-10-CM | POA: Diagnosis not present

## 2014-11-05 NOTE — Patient Instructions (Signed)
Calcium: maximum of 1,200-1,500 mg a day. We will call with a recommendation's for vit D intake.  Osteoporosis Osteoporosis is the thinning and loss of density in the bones. Osteoporosis makes the bones more brittle, fragile, and likely to break (fracture). Over time, osteoporosis can cause the bones to become so weak that they fracture after a simple fall. The bones most likely to fracture are the bones in the hip, wrist, and spine. CAUSES  The exact cause is not known. RISK FACTORS Anyone can develop osteoporosis. You may be at greater risk if you have a family history of the condition or have poor nutrition. You may also have a higher risk if you are:   Female.   79 years old or older.  A smoker.  Not physically active.   White or Asian.  Slender. SIGNS AND SYMPTOMS  A fracture might be the first sign of the disease, especially if it results from a fall or injury that would not usually cause a bone to break. Other signs and symptoms include:   Low back and neck pain.  Stooped posture.  Height loss. DIAGNOSIS  To make a diagnosis, your health care provider may:  Take a medical history.  Perform a physical exam.  Order tests, such as:  A bone mineral density test.  A dual-energy X-ray absorptiometry test. TREATMENT  The goal of osteoporosis treatment is to strengthen your bones to reduce your risk of a fracture. Treatment may involve:  Making lifestyle changes, such as:  Eating a diet rich in calcium.  Doing weight-bearing and muscle-strengthening exercises.  Stopping tobacco use.  Limiting alcohol intake.  Taking medicine to slow the process of bone loss or to increase bone density.  Monitoring your levels of calcium and vitamin D. HOME CARE INSTRUCTIONS  Include calcium and vitamin D in your diet. Calcium is important for bone health, and vitamin D helps the body absorb calcium.  Perform weight-bearing and muscle-strengthening exercises as directed by  your health care provider.  Do not use any tobacco products, including cigarettes, chewing tobacco, and electronic cigarettes. If you need help quitting, ask your health care provider.  Limit your alcohol intake.  Take medicines only as directed by your health care provider.  Keep all follow-up visits as directed by your health care provider. This is important.  Take precautions at home to lower your risk of falling, such as:  Keeping rooms well lit and clutter free.  Installing safety rails on stairs.  Using rubber mats in the bathroom and other areas that are often wet or slippery. SEEK IMMEDIATE MEDICAL CARE IF:  You fall or injure yourself.    This information is not intended to replace advice given to you by your health care provider. Make sure you discuss any questions you have with your health care provider.   Document Released: 10/13/2004 Document Revised: 01/24/2014 Document Reviewed: 06/13/2013 Elsevier Interactive Patient Education [employer].
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
## 2014-11-06 LAB — VITAMIN D 25 HYDROXY (VIT D DEFICIENCY, FRACTURES): VIT D 25 HYDROXY: 49 ng/mL (ref 30–100)

## 2014-11-24 ENCOUNTER — Encounter: Payer: Self-pay | Admitting: *Deleted

## 2014-11-24 DIAGNOSIS — Z006 Encounter for examination for normal comparison and control in clinical research program: Secondary | ICD-10-CM

## 2014-11-24 NOTE — Progress Notes (Signed)
Left message for patient to call back about SPIRE Research study on 11/21/14.

## 2014-11-24 NOTE — Progress Notes (Signed)
SPIRE Research termination information given to patient . Also Instructed patient NOT to take anymore IP, her last dose was Oct 26th. We will call her to schedule her EOS visit once we obtain lab kits. Verbalizes understanding.

## 2014-12-03 ENCOUNTER — Encounter: Payer: Self-pay | Admitting: *Deleted

## 2014-12-03 ENCOUNTER — Other Ambulatory Visit: Payer: Self-pay | Admitting: *Deleted

## 2014-12-03 DIAGNOSIS — Z006 Encounter for examination for normal comparison and control in clinical research program: Secondary | ICD-10-CM

## 2014-12-03 NOTE — Progress Notes (Signed)
Expand All Collapse All   SPIRE Research End of study office visit completed. (See Source in pt. Binder) Last Dose of IP was 11/12/2014 per dosing log. Physical completed by Dr. Lia Foyer. All study drug used and unused returned to research office as instructed. Patient will return on a later date for study related lab work due to unavailable lab kits.

## 2015-06-16 HISTORY — PX: LUMBAR EPIDURAL INJECTION: SHX1980

## 2015-07-16 ENCOUNTER — Encounter: Payer: Self-pay | Admitting: Physician Assistant

## 2015-07-23 ENCOUNTER — Ambulatory Visit (INDEPENDENT_AMBULATORY_CARE_PROVIDER_SITE_OTHER): Payer: BLUE CROSS/BLUE SHIELD | Admitting: Physician Assistant

## 2015-07-23 ENCOUNTER — Encounter: Payer: Self-pay | Admitting: Physician Assistant

## 2015-07-23 ENCOUNTER — Ambulatory Visit (INDEPENDENT_AMBULATORY_CARE_PROVIDER_SITE_OTHER): Payer: BLUE CROSS/BLUE SHIELD

## 2015-07-23 ENCOUNTER — Other Ambulatory Visit: Payer: Self-pay | Admitting: Physician Assistant

## 2015-07-23 VITALS — BP 142/90 | HR 86 | Ht 63.0 in | Wt 161.1 lb

## 2015-07-23 DIAGNOSIS — R079 Chest pain, unspecified: Secondary | ICD-10-CM

## 2015-07-23 DIAGNOSIS — I10 Essential (primary) hypertension: Secondary | ICD-10-CM

## 2015-07-23 DIAGNOSIS — R0602 Shortness of breath: Secondary | ICD-10-CM

## 2015-07-23 DIAGNOSIS — R002 Palpitations: Secondary | ICD-10-CM

## 2015-07-23 HISTORY — DX: Palpitations: R00.2

## 2015-07-23 HISTORY — DX: Chest pain, unspecified: R07.9

## 2015-07-23 LAB — CBC WITH DIFFERENTIAL/PLATELET
BASOS ABS: 116 {cells}/uL (ref 0–200)
BASOS PCT: 1 %
EOS ABS: 116 {cells}/uL (ref 15–500)
EOS PCT: 1 %
HCT: 43.2 % (ref 35.0–45.0)
Hemoglobin: 15 g/dL (ref 11.7–15.5)
LYMPHS PCT: 24 %
Lymphs Abs: 2784 cells/uL (ref 850–3900)
MCH: 31 pg (ref 27.0–33.0)
MCHC: 34.7 g/dL (ref 32.0–36.0)
MCV: 89.3 fL (ref 80.0–100.0)
MPV: 9.9 fL (ref 7.5–12.5)
Monocytes Absolute: 696 cells/uL (ref 200–950)
Monocytes Relative: 6 %
Neutro Abs: 7888 cells/uL — ABNORMAL HIGH (ref 1500–7800)
Neutrophils Relative %: 68 %
PLATELETS: 278 10*3/uL (ref 140–400)
RBC: 4.84 MIL/uL (ref 3.80–5.10)
RDW: 14.2 % (ref 11.0–15.0)
WBC: 11.6 10*3/uL — AB (ref 3.8–10.8)

## 2015-07-23 LAB — HEPATIC FUNCTION PANEL
ALBUMIN: 4.1 g/dL (ref 3.6–5.1)
ALK PHOS: 84 U/L (ref 33–130)
ALT: 15 U/L (ref 6–29)
AST: 19 U/L (ref 10–35)
BILIRUBIN DIRECT: 0.1 mg/dL (ref <=0.2)
BILIRUBIN TOTAL: 0.6 mg/dL (ref 0.2–1.2)
Indirect Bilirubin: 0.5 mg/dL (ref 0.2–1.2)
TOTAL PROTEIN: 6.6 g/dL (ref 6.1–8.1)

## 2015-07-23 LAB — BASIC METABOLIC PANEL
BUN: 14 mg/dL (ref 7–25)
CALCIUM: 8.9 mg/dL (ref 8.6–10.4)
CO2: 20 mmol/L (ref 20–31)
CREATININE: 0.65 mg/dL (ref 0.50–0.99)
Chloride: 101 mmol/L (ref 98–110)
Glucose, Bld: 102 mg/dL — ABNORMAL HIGH (ref 65–99)
Potassium: 4.7 mmol/L (ref 3.5–5.3)
Sodium: 132 mmol/L — ABNORMAL LOW (ref 135–146)

## 2015-07-23 NOTE — Addendum Note (Signed)
Addended by: Devra Dopp E on: 07/23/2015 03:47 PM   Modules accepted: Orders

## 2015-07-23 NOTE — Patient Instructions (Addendum)
Medication Instructions:  Your physician recommends that you continue on your current medications as directed. Please refer to the Current Medication list given to you today.   Labwork: TODAY   CBC  BMET  LIVER  TSH   Testing/Procedures:  Your physician has requested that you have a lexiscan myoview. For further information please visit [email or web address]. Please follow instruction sheet, as given. Your physician has recommended that you wear an event monitor. Event monitors are medical devices that record the heart's electrical activity. Doctors most often [place] these monitors to diagnose arrhythmias. Arrhythmias are problems with the speed or rhythm of the heartbeat. The monitor is a small, portable device. You can wear one while you do your normal daily activities. This is usually used to diagnose what is causing palpitations/syncope (passing out).  Follow-Up:  Your physician recommends that you schedule a follow-up appointment in: 1 MONTH WITH DR  Irish Lack Any Other Special Instructions Will Be Listed Below (If Applicable).     If you need a refill on your cardiac medications before your next appointment, please call your pharmacy.

## 2015-07-23 NOTE — Progress Notes (Signed)
Cardiology Office Note    Date:  07/23/2015   ID:  DAIJHA MCCONAUGHEY, DOB Sep 20, 1950, MRN [id number]  PCP:  Reginia Naas, MD  Cardiologist: Dr. Irish Lack  CC: chest pain  History of Present Illness:  Vicki [name] is a 65 y.o. female with history of hyperlipidemia enrolled in the study with a lipid clinic for a year.Can't take statins. History of TIA, HTN, former smoker(quit 6 yrs ago) GM died MI 60's, Mother with "heart flutter" brother with MI 39's, brother with pacer 55's. Also had chest pain and underwent stress echo in 2015. Patient had no angina with exercise or EKG changes. Echo was difficult to interpret however symptoms improve no further testing if she has more symptoms stress nuclear or cath could be done. She hasn't been seen since 2015.  June 19th she complained of chest pressure into her shoulders when she layed down. Pain went into her jaw. She took another aspirin and pain lasted all night. BP was high.Headache and vomiting. Didn't go to ER.   Since then she's had neck and chest pressure when she lays down. Can walk 5-10 min before she has to sit down because her heart is racing so fast. Drinks caffeine occasionally 2-3 times/week.Has a lot of stress and anciety. PTSD, sees Dr. Charlott Holler monthly. Learning to manage her stress, panic attacks.    Past Medical History  Diagnosis Date  . Migraines     with aura  . 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 Hyperlipidemia   . Anxiety   . Alcohol intoxication ([place]) 05/2014     ED note   . Osteopenia     Past Surgical History  Procedure Laterality Date  . Hand surgery      nerve & tendon repair  . Breast enhancement surgery    . Facial cosmetic surgery      Current Medications: Outpatient Prescriptions Prior to Visit  Medication Sig Dispense Refill  . amLODipine (NORVASC) 2.5 MG tablet Take 2.5 mg by mouth daily.    Marland Kitchen aspirin 325 MG tablet Take 325 mg by mouth at bedtime.     . calcium-vitamin D  (OSCAL WITH D) 500-200 MG-UNIT tablet Take 1 tablet by mouth.    . Cholecalciferol 5000 UNITS capsule Take 10,000-15,000 Units by mouth 2 (two) times daily. Usually 3 tabs in am and 2 tabs in pm    . citalopram (CELEXA) 10 MG tablet Take 10 mg by mouth daily.    . cyanocobalamin (,VITAMIN B-12,) 1000 MCG/ML injection Inject 1,000 mcg into the muscle every 30 (thirty) days. Last injection 05/23/2014    . dimenhyDRINATE (DRAMAMINE) 50 MG tablet Take 50 mg by mouth every 8 (eight) hours as needed for nausea.    Marland Kitchen KRILL OIL PO Take 1 capsule by mouth daily.    Marland Kitchen losartan (COZAAR) 100 MG tablet Take 100 mg by mouth daily.     Marland Kitchen MAGNESIUM PO Take 2 tablets by mouth daily.     Marland Kitchen MILK THISTLE PO Take 1 tablet by mouth daily.    . naproxen sodium (ANAPROX) 220 MG tablet Take 220 mg by mouth See admin instructions. Takes 1 tab twice daily, can take addt'l tab as needed for pain    . Omega-3 Fatty Acids (FISH OIL) 1000 MG CAPS Take 1 capsule (1,000 mg total) by mouth 2 (two) times daily. 60 capsule 0  . sertraline (ZOLOFT) 100 MG tablet Take 50 mg by mouth 2 (  two) times daily.    . traZODone (DESYREL) 100 MG tablet Take 100 mg by mouth at bedtime.     No facility-administered medications prior to visit.     Allergies:   Codeine; Crestor; Other; and Simvastatin   Social History   Social History  . Marital Status: Married    Spouse Name: N/A  . Number of Children: N/A  . Years of Education: N/A   Social History Main Topics  . Smoking status: Former Research scientist (life sciences)  . Smokeless tobacco: Never Used  . Alcohol Use: 2.4 oz/week    4 Glasses of wine per week  . Drug Use: No  . Sexual Activity:    Partners: Male    Birth Control/ Protection: Post-menopausal   Other Topics Concern  . Not on file   Social History Narrative     Family History:  The patient's    family history includes Depression in her father; Heart disease in her brother; Hypertension in her brother; Stroke in her father and mother.    ROS:   Please see the history of present illness.    Review of Systems  Constitution: Positive for night sweats.  HENT: Positive for headaches.   Eyes: Positive for visual disturbance.  Cardiovascular: Negative.   Respiratory: Negative.   Hematologic/Lymphatic: Negative.   Musculoskeletal: Positive for arthritis and back pain. Negative for joint pain.  Gastrointestinal: Positive for nausea and vomiting.  Genitourinary: Negative.   Neurological: Positive for dizziness.  Psychiatric/Behavioral: Positive for depression. The patient is nervous/anxious.    All other systems reviewed and are negative.   PHYSICAL EXAM:   VS:  BP 142/90 mmHg  Pulse 86  Ht 5\' 3"  (1.6 m)  Wt 161 lb 1.9 oz (73.084 kg)  BMI 28.55 kg/m2  LMP 07/18/2011  Physical Exam  GEN: Well nourished, well developed, in no acute distress, Very anxious Neck: no JVD, carotid bruits, or masses Cardiac:RRR; no murmurs, rubs, or gallops  Respiratory:  clear to auscultation bilaterally, normal work of breathing GI: soft, nontender, nondistended, + BS Ext: without cyanosis, clubbing, or edema, Good distal pulses bilaterally MS: no deformity or atrophy Skin: warm and dry, no rash Neuro:  Alert and Oriented x 3, Strength and sensation are intact Psych: euthymic mood, full affect  Wt Readings from Last 3 Encounters:  07/23/15 161 lb 1.9 oz (73.084 kg)  11/05/14 170 lb (77.111 kg)  09/03/14 171 lb (77.565 kg)      Studies/Labs Reviewed:   EKG:  EKG is ordered today.  The ekg ordered today demonstrates Poor baseline looks like normal sinus rhythm but patient shaking and cannot get a good tracing  Recent Labs: No results found for requested labs within last 365 days.   Lipid Panel    Component Value Date/Time   CHOL 235* 05/25/2014 0535   TRIG 352* 05/25/2014 0535   HDL 38* 05/25/2014 0535   CHOLHDL 6.2 05/25/2014 0535   VLDL 70* 05/25/2014 0535   LDLCALC 127* 05/25/2014 0535    Additional studies/ records  that were reviewed today include:  Stress echo 2015Study Conclusions  - Stress ECG conclusions: There were no stress arrhythmias   or conduction abnormalities. The stress ECG was normal.   [facility] scoring: exercise time of 4.41min; maximum ST   deviation of 38mm; no angina; resulting score is 5. This   score predicts a moderate risk of cardiac events. - Staged echo: Difficult stress echo images due to bilateral   breast implants. LVF on stress images appears  normal in   the parasternal short and long axis veiws. The Apical 4   chamber view during stress appears normal as well but the   apical 2 chamber view is of poor quality with LV bounce   making interpretation difficult. If suspicion for ischemia   high, suggest considering Stress myoview There was no   echocardiographic evidence for stress-induced ischemia. Impressions:  - Difficult stress echo images due to bilateral breast   implants. LVF on stress images appears normal in the   parasternal short and long axis veiws. The Apical 4   chamber view during stress appears normal as well but the   apical 2 chamber view is of poor quality with LV bounce   making interpretation difficult. If suspicion for ischemia   high, suggest considering Stress myoview Bruce protocol. Stress echocardiography.  Height:  Height: 157.5cm. Height: 62in.  Weight:  Weight: 77.3kg. Weight: 170lb.  Body mass index:  BMI: 31.2kg/m^2.  Body surface area:    BSA: 1.76m^2.  Blood pressure:     0000000 (systolic). Patient status:  Outpatient.     ASSESSMENT:    1. Essential hypertension, benign   2. SOB (shortness of breath)   3. Palpitations   4. Chest pain, unspecified chest pain type      PLAN:  In order of problems listed above:  Hypertension blood pressure is up a little today but it's usually controlled at home no change in medications  Shortness of breath and palpitations could be related to stress and anxiety that she has palpitations with little  activity and is limited to what she can do. Place an event recorder  Chest pain mostly at rest had a stress echo in the past that was difficult to interpret because of breast implants. Will order lexiscan myoview and f/u with Dr. Irish Lack.    Medication Adjustments/Labs and Tests Ordered: Current medicines are reviewed at length with the patient today.  Concerns regarding medicines are outlined above.  Medication changes, Labs and Tests ordered today are listed in the Patient Instructions below. Patient Instructions  Medication Instructions:  Your physician recommends that you continue on your current medications as directed. Please refer to the Current Medication list given to you today.   Labwork: TODAY   CBC  BMET  LIVER  TSH   Testing/Procedures:  Your physician has requested that you have a lexiscan myoview. For further information please visit [email or web address]. Please follow instruction sheet, as given. Your physician has recommended that you wear an event monitor. Event monitors are medical devices that record the heart's electrical activity. Doctors most often [place] these monitors to diagnose arrhythmias. Arrhythmias are problems with the speed or rhythm of the heartbeat. The monitor is a small, portable device. You can wear one while you do your normal daily activities. This is usually used to diagnose what is causing palpitations/syncope (passing out).  Follow-Up:  Your physician recommends that you schedule a follow-up appointment in: 1 MONTH WITH DR  Irish Lack Any Other Special Instructions Will Be Listed Below (If Applicable).     If you need a refill on your cardiac medications before your next appointment, please call your pharmacy.       Sumner Boast, PA-C  07/23/2015 3:39 PM    [facility] [address], [place], [place]  [address] Phone: [phone]; Fax: [phone]

## 2015-07-24 LAB — TSH: TSH: 4.01 mIU/L

## 2015-07-30 ENCOUNTER — Telehealth (HOSPITAL_COMMUNITY): Payer: Self-pay | Admitting: *Deleted

## 2015-07-30 NOTE — Telephone Encounter (Signed)
Patient given detailed instructions per Myocardial Perfusion Study Information Sheet for the test on 08/05/15 at 0945. Patient notified to arrive 15 minutes early and that it is imperative to arrive on time for appointment to keep from having the test rescheduled.  If you need to cancel or reschedule your appointment, please call the office within 24 hours of your appointment. Failure to do so may result in a cancellation of your appointment, and a $50 no show fee. Patient verbalized understanding.Sharma Lawrance, Ranae Palms

## 2015-08-05 ENCOUNTER — Ambulatory Visit (HOSPITAL_COMMUNITY): Payer: BLUE CROSS/BLUE SHIELD | Attending: Cardiovascular Disease

## 2015-08-05 DIAGNOSIS — R079 Chest pain, unspecified: Secondary | ICD-10-CM | POA: Insufficient documentation

## 2015-08-05 DIAGNOSIS — Z8249 Family history of ischemic heart disease and other diseases of the circulatory system: Secondary | ICD-10-CM | POA: Diagnosis not present

## 2015-08-05 DIAGNOSIS — R9439 Abnormal result of other cardiovascular function study: Secondary | ICD-10-CM | POA: Insufficient documentation

## 2015-08-05 DIAGNOSIS — R002 Palpitations: Secondary | ICD-10-CM | POA: Insufficient documentation

## 2015-08-05 DIAGNOSIS — R0602 Shortness of breath: Secondary | ICD-10-CM | POA: Diagnosis not present

## 2015-08-05 LAB — MYOCARDIAL PERFUSION IMAGING
CHL CUP NUCLEAR SRS: 9
CHL CUP NUCLEAR SSS: 17
LVDIAVOL: 51 mL (ref 46–106)
LVSYSVOL: 11 mL
Peak HR: 85 {beats}/min
RATE: 0.23
Rest HR: 67 {beats}/min
SDS: 8
TID: 1.64

## 2015-08-05 MED ORDER — TECHNETIUM TC 99M TETROFOSMIN IV KIT
10.4000 | PACK | Freq: Once | INTRAVENOUS | Status: AC | PRN
  Administered 2015-08-05: 10 via INTRAVENOUS
  Filled 2015-08-05: qty 10

## 2015-08-05 MED ORDER — TECHNETIUM TC 99M TETROFOSMIN IV KIT
32.4000 | PACK | Freq: Once | INTRAVENOUS | Status: AC | PRN
  Administered 2015-08-05: 32 via INTRAVENOUS
  Filled 2015-08-05: qty 32

## 2015-08-05 MED ORDER — REGADENOSON 0.4 MG/5ML IV SOLN
0.4000 mg | Freq: Once | INTRAVENOUS | Status: AC
  Administered 2015-08-05: 0.4 mg via INTRAVENOUS

## 2015-08-07 ENCOUNTER — Telehealth: Payer: Self-pay

## 2015-08-07 NOTE — Telephone Encounter (Signed)
**Note De-Identified  Obfuscation** I received a call from Ermalinda Barrios, PA-c late Friday afternoon asking me to have Dr Irish Lack review the pts Myoview as she was concerned about the pts resting CP. Dr Irish Lack was working in the cath lab and was not able to respond to me until 5:45.  Per Dr Irish Lack this pt needs a cath on 7/26 to be done by Dr Irish Lack. I attempted several times to contact the pt but was unable to reach her as her VM has not been set up.  I am sending this message to triage to address on Monday 7/24.  The pt will need pre cath labs, cath instructions and date and time of cath. Thanks.

## 2015-08-10 ENCOUNTER — Other Ambulatory Visit: Payer: Self-pay | Admitting: Physician Assistant

## 2015-08-10 NOTE — Telephone Encounter (Signed)
LMTCB

## 2015-08-10 NOTE — Telephone Encounter (Signed)
She does report CP at rest (not currently), states that it feels like severe indigestion.  Advised patient of Dr. Hassell Done recommendation for cath Wednesday.  Patient is agreeable. Reviewed instructions w/ patient.  She will have pre labs done at the hospital the morning or procedure. She was instructed to prepare for overnight stay if intervention required. Patient verbalized understanding and agreeable to plan.

## 2015-08-11 ENCOUNTER — Other Ambulatory Visit: Payer: Self-pay | Admitting: Interventional Cardiology

## 2015-08-11 DIAGNOSIS — I209 Angina pectoris, unspecified: Secondary | ICD-10-CM

## 2015-08-12 ENCOUNTER — Ambulatory Visit (HOSPITAL_COMMUNITY)
Admission: RE | Admit: 2015-08-12 | Discharge: 2015-08-12 | Disposition: A | Discharge status: Home / Self Care | Payer: BLUE CROSS/BLUE SHIELD | Source: Ambulatory Visit | Attending: Interventional Cardiology | Admitting: Interventional Cardiology

## 2015-08-12 ENCOUNTER — Encounter (HOSPITAL_COMMUNITY)
Admission: RE | Disposition: A | Discharge status: Home / Self Care | Payer: Self-pay | Source: Ambulatory Visit | Attending: Interventional Cardiology

## 2015-08-12 DIAGNOSIS — Z823 Family history of stroke: Secondary | ICD-10-CM | POA: Insufficient documentation

## 2015-08-12 DIAGNOSIS — R9439 Abnormal result of other cardiovascular function study: Secondary | ICD-10-CM | POA: Diagnosis present

## 2015-08-12 DIAGNOSIS — Z7982 Long term (current) use of aspirin: Secondary | ICD-10-CM | POA: Diagnosis not present

## 2015-08-12 DIAGNOSIS — M8580 Other specified disorders of bone density and structure, unspecified site: Secondary | ICD-10-CM | POA: Diagnosis not present

## 2015-08-12 DIAGNOSIS — E785 Hyperlipidemia, unspecified: Secondary | ICD-10-CM | POA: Insufficient documentation

## 2015-08-12 DIAGNOSIS — R002 Palpitations: Secondary | ICD-10-CM | POA: Diagnosis not present

## 2015-08-12 DIAGNOSIS — I10 Essential (primary) hypertension: Secondary | ICD-10-CM | POA: Insufficient documentation

## 2015-08-12 DIAGNOSIS — R079 Chest pain, unspecified: Secondary | ICD-10-CM | POA: Insufficient documentation

## 2015-08-12 DIAGNOSIS — I2510 Atherosclerotic heart disease of native coronary artery without angina pectoris: Secondary | ICD-10-CM | POA: Diagnosis not present

## 2015-08-12 DIAGNOSIS — F419 Anxiety disorder, unspecified: Secondary | ICD-10-CM | POA: Insufficient documentation

## 2015-08-12 DIAGNOSIS — Z8673 Personal history of transient ischemic attack (TIA), and cerebral infarction without residual deficits: Secondary | ICD-10-CM | POA: Diagnosis not present

## 2015-08-12 DIAGNOSIS — Z8249 Family history of ischemic heart disease and other diseases of the circulatory system: Secondary | ICD-10-CM | POA: Insufficient documentation

## 2015-08-12 DIAGNOSIS — Z87891 Personal history of nicotine dependence: Secondary | ICD-10-CM | POA: Diagnosis not present

## 2015-08-12 DIAGNOSIS — I739 Peripheral vascular disease, unspecified: Secondary | ICD-10-CM | POA: Insufficient documentation

## 2015-08-12 DIAGNOSIS — R931 Abnormal findings on diagnostic imaging of heart and coronary circulation: Secondary | ICD-10-CM

## 2015-08-12 DIAGNOSIS — G43109 Migraine with aura, not intractable, without status migrainosus: Secondary | ICD-10-CM | POA: Insufficient documentation

## 2015-08-12 DIAGNOSIS — I209 Angina pectoris, unspecified: Secondary | ICD-10-CM

## 2015-08-12 HISTORY — PX: CARDIAC CATHETERIZATION: SHX172

## 2015-08-12 LAB — CBC
HEMATOCRIT: 42.3 % (ref 36.0–46.0)
HEMOGLOBIN: 14.1 g/dL (ref 12.0–15.0)
MCH: 30.9 pg (ref 26.0–34.0)
MCHC: 33.3 g/dL (ref 30.0–36.0)
MCV: 92.8 fL (ref 78.0–100.0)
Platelets: 208 10*3/uL (ref 150–400)
RBC: 4.56 MIL/uL (ref 3.87–5.11)
RDW: 13.8 % (ref 11.5–15.5)
WBC: 8.5 10*3/uL (ref 4.0–10.5)

## 2015-08-12 LAB — PROTIME-INR
INR: 0.92
Prothrombin Time: 12.4 seconds (ref 11.4–15.2)

## 2015-08-12 LAB — BASIC METABOLIC PANEL
ANION GAP: 8 (ref 5–15)
BUN: 24 mg/dL — ABNORMAL HIGH (ref 6–20)
CALCIUM: 8.7 mg/dL — AB (ref 8.9–10.3)
CO2: 23 mmol/L (ref 22–32)
Chloride: 101 mmol/L (ref 101–111)
Creatinine, Ser: 0.97 mg/dL (ref 0.44–1.00)
GFR calc Af Amer: >60 mL/min (ref >60)
GLUCOSE: 112 mg/dL — AB (ref 65–99)
Potassium: 5.1 mmol/L (ref 3.5–5.1)
SODIUM: 132 mmol/L — AB (ref 135–145)

## 2015-08-12 SURGERY — LEFT HEART CATH AND CORONARY ANGIOGRAPHY

## 2015-08-12 MED ORDER — ONDANSETRON HCL 4 MG/2ML IJ SOLN
4.0000 mg | Freq: Once | INTRAMUSCULAR | Status: AC
  Administered 2015-08-12: 4 mg via INTRAVENOUS

## 2015-08-12 MED ORDER — LIDOCAINE HCL (PF) 1 % IJ SOLN
INTRAMUSCULAR | Status: AC
  Filled 2015-08-12: qty 30

## 2015-08-12 MED ORDER — SODIUM CHLORIDE 0.9% FLUSH: 3.0000 mL | INTRAVENOUS | Status: DC | PRN

## 2015-08-12 MED ORDER — ASPIRIN 81 MG PO CHEW
81.0000 mg | CHEWABLE_TABLET | ORAL | Status: DC
  Administered 2015-08-12: 81 mg via ORAL

## 2015-08-12 MED ORDER — ASPIRIN 81 MG PO CHEW
CHEWABLE_TABLET | ORAL | Status: AC
  Administered 2015-08-12: 81 mg via ORAL
  Filled 2015-08-12: qty 1

## 2015-08-12 MED ORDER — SODIUM CHLORIDE 0.9 % WEIGHT BASED INFUSION: 1.0000 mL/kg/h | INTRAVENOUS | Status: DC

## 2015-08-12 MED ORDER — IOPAMIDOL (ISOVUE-370) INJECTION 76%
INTRAVENOUS | Status: AC
Start: 2015-08-12 — End: 2015-08-12
  Filled 2015-08-12: qty 100

## 2015-08-12 MED ORDER — VERAPAMIL HCL 2.5 MG/ML IV SOLN
INTRAVENOUS | Status: DC | PRN
  Administered 2015-08-12: 2 mg via INTRA_ARTERIAL

## 2015-08-12 MED ORDER — FENTANYL CITRATE (PF) 100 MCG/2ML IJ SOLN
INTRAMUSCULAR | Status: DC | PRN
  Administered 2015-08-12 (×3): 25 ug via INTRAVENOUS

## 2015-08-12 MED ORDER — SODIUM CHLORIDE 0.9 % WEIGHT BASED INFUSION: 3.0000 mL/kg/h | INTRAVENOUS | Status: DC

## 2015-08-12 MED ORDER — LIDOCAINE HCL (PF) 1 % IJ SOLN
INTRAMUSCULAR | Status: DC | PRN
Start: 2015-08-12 — End: 2015-08-12
  Administered 2015-08-12: 2 mL

## 2015-08-12 MED ORDER — NITROGLYCERIN 1 MG/10 ML FOR IR/CATH LAB
INTRA_ARTERIAL | Status: AC
  Filled 2015-08-12: qty 10

## 2015-08-12 MED ORDER — MIDAZOLAM HCL 2 MG/2ML IJ SOLN
INTRAMUSCULAR | Status: AC
  Filled 2015-08-12: qty 2

## 2015-08-12 MED ORDER — SODIUM CHLORIDE 0.9 % WEIGHT BASED INFUSION
3.0000 mL/kg/h | INTRAVENOUS | Status: DC
  Administered 2015-08-12: 3 mL/kg/h via INTRAVENOUS

## 2015-08-12 MED ORDER — VERAPAMIL HCL 2.5 MG/ML IV SOLN
INTRAVENOUS | Status: DC | PRN
  Administered 2015-08-12: 10 mL via INTRA_ARTERIAL

## 2015-08-12 MED ORDER — IOPAMIDOL (ISOVUE-370) INJECTION 76%
INTRAVENOUS | Status: DC | PRN
  Administered 2015-08-12: 40 mL via INTRA_ARTERIAL

## 2015-08-12 MED ORDER — SODIUM CHLORIDE 0.9 % IV SOLN: 250.0000 mL | INTRAVENOUS | Status: DC | PRN

## 2015-08-12 MED ORDER — SODIUM CHLORIDE 0.9% FLUSH
3.0000 mL | INTRAVENOUS | Status: DC | PRN
Start: 2015-08-12 — End: 2015-08-12

## 2015-08-12 MED ORDER — FENTANYL CITRATE (PF) 100 MCG/2ML IJ SOLN
INTRAMUSCULAR | Status: AC
  Filled 2015-08-12: qty 2

## 2015-08-12 MED ORDER — HEPARIN (PORCINE) IN NACL 2-0.9 UNIT/ML-% IJ SOLN
INTRAMUSCULAR | Status: DC | PRN
  Administered 2015-08-12: 1000 mL

## 2015-08-12 MED ORDER — ONDANSETRON HCL 4 MG/2ML IJ SOLN
INTRAMUSCULAR | Status: AC
  Filled 2015-08-12: qty 2

## 2015-08-12 MED ORDER — HEPARIN SODIUM (PORCINE) 1000 UNIT/ML IJ SOLN
INTRAMUSCULAR | Status: DC | PRN
  Administered 2015-08-12: 3500 [IU] via INTRAVENOUS

## 2015-08-12 MED ORDER — SODIUM CHLORIDE 0.9% FLUSH: 3.0000 mL | Freq: Two times a day (BID) | INTRAVENOUS | Status: DC

## 2015-08-12 MED ORDER — HEPARIN (PORCINE) IN NACL 2-0.9 UNIT/ML-% IJ SOLN
INTRAMUSCULAR | Status: AC
Start: 2015-08-12 — End: 2015-08-12
  Filled 2015-08-12: qty 1000

## 2015-08-12 MED ORDER — VERAPAMIL HCL 2.5 MG/ML IV SOLN
INTRAVENOUS | Status: AC
  Filled 2015-08-12: qty 2

## 2015-08-12 MED ORDER — MIDAZOLAM HCL 2 MG/2ML IJ SOLN
INTRAMUSCULAR | Status: DC | PRN
  Administered 2015-08-12: 2 mg via INTRAVENOUS
  Administered 2015-08-12 (×2): 1 mg via INTRAVENOUS

## 2015-08-12 MED ORDER — NITROGLYCERIN 1 MG/10 ML FOR IR/CATH LAB
INTRA_ARTERIAL | Status: DC | PRN
  Administered 2015-08-12: 200 ug via INTRA_ARTERIAL

## 2015-08-12 MED ORDER — ASPIRIN 81 MG PO CHEW: 81.0000 mg | CHEWABLE_TABLET | ORAL | Status: DC

## 2015-08-12 SURGICAL SUPPLY — 9 items
CATH INFINITI 5 FR JL3.5 (CATHETERS) ×3 IMPLANT
CATH INFINITI JR4 5F (CATHETERS) ×3 IMPLANT
DEVICE RAD COMP TR BAND LRG (VASCULAR PRODUCTS) ×3 IMPLANT
GLIDESHEATH SLEND SS 6F .021 (SHEATH) ×3 IMPLANT
KIT HEART LEFT (KITS) ×3 IMPLANT
PACK CARDIAC CATHETERIZATION (CUSTOM PROCEDURE TRAY) ×3 IMPLANT
TRANSDUCER W/STOPCOCK (MISCELLANEOUS) ×3 IMPLANT
TUBING CIL FLEX 10 FLL-RA (TUBING) ×3 IMPLANT
WIRE SAFE-T 1.5MM-J .035X260CM (WIRE) ×3 IMPLANT

## 2015-08-12 NOTE — H&P (View-Only) (Signed)
Cardiology Office Note    Date:  07/23/2015   ID:  Vicki [name], DOB [birth year], MRN [id number]  PCP:  Reginia Naas, MD  Cardiologist: Dr. Irish Lack  CC: chest pain  History of Present Illness:  Vicki [name] is a 65 y.o. female with history of hyperlipidemia enrolled in the study with a lipid clinic for a year.Can't take statins. History of TIA, HTN, former smoker(quit 6 yrs ago) GM died MI 76's, Mother with "heart flutter" brother with MI 38's, brother with pacer 3's. Also had chest pain and underwent stress echo in 2015. Patient had no angina with exercise or EKG changes. Echo was difficult to interpret however symptoms improve no further testing if she has more symptoms stress nuclear or cath could be done. She hasn't been seen since 2015.  June 19th she complained of chest pressure into her shoulders when she layed down. Pain went into her jaw. She took another aspirin and pain lasted all night. BP was high.Headache and vomiting. Didn't go to ER.   Since then she's had neck and chest pressure when she lays down. Can walk 5-10 min before she has to sit down because her heart is racing so fast. Drinks caffeine occasionally 2-3 times/week.Has a lot of stress and anciety. PTSD, sees Dr. Charlott Holler monthly. Learning to manage her stress, panic attacks.    Past Medical History  Diagnosis Date  . Migraines     with aura  . 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 Hyperlipidemia   . Anxiety   . Alcohol intoxication ([place]) 05/2014     ED note   . Osteopenia     Past Surgical History  Procedure Laterality Date  . Hand surgery      nerve & tendon repair  . Breast enhancement surgery    . Facial cosmetic surgery      Current Medications: Outpatient Prescriptions Prior to Visit  Medication Sig Dispense Refill  . amLODipine (NORVASC) 2.5 MG tablet Take 2.5 mg by mouth daily.    Marland Kitchen aspirin 325 MG tablet Take 325 mg by mouth at bedtime.     . calcium-vitamin D  (OSCAL WITH D) 500-200 MG-UNIT tablet Take 1 tablet by mouth.    . Cholecalciferol 5000 UNITS capsule Take 10,000-15,000 Units by mouth 2 (two) times daily. Usually 3 tabs in am and 2 tabs in pm    . citalopram (CELEXA) 10 MG tablet Take 10 mg by mouth daily.    . cyanocobalamin (,VITAMIN B-12,) 1000 MCG/ML injection Inject 1,000 mcg into the muscle every 30 (thirty) days. Last injection 05/23/2014    . dimenhyDRINATE (DRAMAMINE) 50 MG tablet Take 50 mg by mouth every 8 (eight) hours as needed for nausea.    Marland Kitchen KRILL OIL PO Take 1 capsule by mouth daily.    Marland Kitchen losartan (COZAAR) 100 MG tablet Take 100 mg by mouth daily.     Marland Kitchen MAGNESIUM PO Take 2 tablets by mouth daily.     Marland Kitchen MILK THISTLE PO Take 1 tablet by mouth daily.    . naproxen sodium (ANAPROX) 220 MG tablet Take 220 mg by mouth See admin instructions. Takes 1 tab twice daily, can take addt'l tab as needed for pain    . Omega-3 Fatty Acids (FISH OIL) 1000 MG CAPS Take 1 capsule (1,000 mg total) by mouth 2 (two) times daily. 60 capsule 0  . sertraline (ZOLOFT) 100 MG tablet Take 50 mg by mouth 2 (  two) times daily.    . traZODone (DESYREL) 100 MG tablet Take 100 mg by mouth at bedtime.     No facility-administered medications prior to visit.     Allergies:   Codeine; Crestor; Other; and Simvastatin   Social History   Social History  . Marital Status: Married    Spouse Name: N/A  . Number of Children: N/A  . Years of Education: N/A   Social History Main Topics  . Smoking status: Former Research scientist (life sciences)  . Smokeless tobacco: Never Used  . Alcohol Use: 2.4 oz/week    4 Glasses of wine per week  . Drug Use: No  . Sexual Activity:    Partners: Male    Birth Control/ Protection: Post-menopausal   Other Topics Concern  . Not on file   Social History Narrative     Family History:  The patient's    family history includes Depression in her father; Heart disease in her brother; Hypertension in her brother; Stroke in her father and mother.    ROS:   Please see the history of present illness.    Review of Systems  Constitution: Positive for night sweats.  HENT: Positive for headaches.   Eyes: Positive for visual disturbance.  Cardiovascular: Negative.   Respiratory: Negative.   Hematologic/Lymphatic: Negative.   Musculoskeletal: Positive for arthritis and back pain. Negative for joint pain.  Gastrointestinal: Positive for nausea and vomiting.  Genitourinary: Negative.   Neurological: Positive for dizziness.  Psychiatric/Behavioral: Positive for depression. The patient is nervous/anxious.    All other systems reviewed and are negative.   PHYSICAL EXAM:   VS:  BP 142/90 mmHg  Pulse 86  Ht 5\' 3"  (1.6 m)  Wt 161 lb 1.9 oz (73.084 kg)  BMI 28.55 kg/m2  LMP 07/18/2011  Physical Exam  GEN: Well nourished, well developed, in no acute distress, Very anxious Neck: no JVD, carotid bruits, or masses Cardiac:RRR; no murmurs, rubs, or gallops  Respiratory:  clear to auscultation bilaterally, normal work of breathing GI: soft, nontender, nondistended, + BS Ext: without cyanosis, clubbing, or edema, Good distal pulses bilaterally MS: no deformity or atrophy Skin: warm and dry, no rash Neuro:  Alert and Oriented x 3, Strength and sensation are intact Psych: euthymic mood, full affect  Wt Readings from Last 3 Encounters:  07/23/15 161 lb 1.9 oz (73.084 kg)  11/05/14 170 lb (77.111 kg)  09/03/14 171 lb (77.565 kg)      Studies/Labs Reviewed:   EKG:  EKG is ordered today.  The ekg ordered today demonstrates Poor baseline looks like normal sinus rhythm but patient shaking and cannot get a good tracing  Recent Labs: No results found for requested labs within last 365 days.   Lipid Panel    Component Value Date/Time   CHOL 235* 05/25/2014 0535   TRIG 352* 05/25/2014 0535   HDL 38* 05/25/2014 0535   CHOLHDL 6.2 05/25/2014 0535   VLDL 70* 05/25/2014 0535   LDLCALC 127* 05/25/2014 0535    Additional studies/ records  that were reviewed today include:  Stress echo 2015Study Conclusions  - Stress ECG conclusions: There were no stress arrhythmias   or conduction abnormalities. The stress ECG was normal.   [facility] scoring: exercise time of 4.41min; maximum ST   deviation of 38mm; no angina; resulting score is 5. This   score predicts a moderate risk of cardiac events. - Staged echo: Difficult stress echo images due to bilateral   breast implants. LVF on stress images appears  normal in   the parasternal short and long axis veiws. The Apical 4   chamber view during stress appears normal as well but the   apical 2 chamber view is of poor quality with LV bounce   making interpretation difficult. If suspicion for ischemia   high, suggest considering Stress myoview There was no   echocardiographic evidence for stress-induced ischemia. Impressions:  - Difficult stress echo images due to bilateral breast   implants. LVF on stress images appears normal in the   parasternal short and long axis veiws. The Apical 4   chamber view during stress appears normal as well but the   apical 2 chamber view is of poor quality with LV bounce   making interpretation difficult. If suspicion for ischemia   high, suggest considering Stress myoview Bruce protocol. Stress echocardiography.  Height:  Height: 157.5cm. Height: 62in.  Weight:  Weight: 77.3kg. Weight: 170lb.  Body mass index:  BMI: 31.2kg/m^2.  Body surface area:    BSA: 1.48m^2.  Blood pressure:     0000000 (systolic). Patient status:  Outpatient.     ASSESSMENT:    1. Essential hypertension, benign   2. SOB (shortness of breath)   3. Palpitations   4. Chest pain, unspecified chest pain type      PLAN:  In order of problems listed above:  Hypertension blood pressure is up a little today but it's usually controlled at home no change in medications  Shortness of breath and palpitations could be related to stress and anxiety that she has palpitations with little  activity and is limited to what she can do. Place an event recorder  Chest pain mostly at rest had a stress echo in the past that was difficult to interpret because of breast implants. Will order lexiscan myoview and f/u with Dr. Irish Lack.    Medication Adjustments/Labs and Tests Ordered: Current medicines are reviewed at length with the patient today.  Concerns regarding medicines are outlined above.  Medication changes, Labs and Tests ordered today are listed in the Patient Instructions below. Patient Instructions  Medication Instructions:  Your physician recommends that you continue on your current medications as directed. Please refer to the Current Medication list given to you today.   Labwork: TODAY   CBC  BMET  LIVER  TSH   Testing/Procedures:  Your physician has requested that you have a lexiscan myoview. For further information please visit [email or web address]. Please follow instruction sheet, as given. Your physician has recommended that you wear an event monitor. Event monitors are medical devices that record the heart's electrical activity. Doctors most often [place] these monitors to diagnose arrhythmias. Arrhythmias are problems with the speed or rhythm of the heartbeat. The monitor is a small, portable device. You can wear one while you do your normal daily activities. This is usually used to diagnose what is causing palpitations/syncope (passing out).  Follow-Up:  Your physician recommends that you schedule a follow-up appointment in: 1 MONTH WITH DR  Irish Lack Any Other Special Instructions Will Be Listed Below (If Applicable).     If you need a refill on your cardiac medications before your next appointment, please call your pharmacy.       Sumner Boast, PA-C  07/23/2015 3:39 PM    [facility] [address], [place], [place]  [address] Phone: [phone]; Fax: [phone]

## 2015-08-12 NOTE — Progress Notes (Signed)
Pt states she is slightly nauseated, Dr Irish Lack was paged/ call was returned, orders followed.

## 2015-08-12 NOTE — Discharge Instructions (Signed)
Radial Site Care °Refer to this sheet in the next few weeks. These instructions provide you with information about caring for yourself after your procedure. Your health care provider may also give you more specific instructions. Your treatment has been planned according to current medical practices, but problems sometimes occur. Call your health care provider if you have any problems or questions after your procedure. °WHAT TO EXPECT AFTER THE PROCEDURE °After your procedure, it is typical to have the following: °· Bruising at the radial site that usually fades within 1-2 weeks. °· Blood collecting in the tissue (hematoma) that may be painful to the touch. It should usually decrease in size and tenderness within 1-2 weeks. °HOME CARE INSTRUCTIONS °· Take medicines only as directed by your health care provider. °· You may shower 24-48 hours after the procedure or as directed by your health care provider. Remove the bandage (dressing) and gently wash the site with plain soap and water. Pat the area dry with a clean towel. Do not rub the site, because this may cause bleeding. °· Do not take baths, swim, or use a hot tub until your health care provider approves. °· Check your insertion site every day for redness, swelling, or drainage. °· Do not apply powder or lotion to the site. °· Do not flex or bend the affected arm for 24 hours or as directed by your health care provider. °· Do not push or pull heavy objects with the affected arm for 24 hours or as directed by your health care provider. °· Do not lift over 10 lb (4.5 kg) for 5 days after your procedure or as directed by your health care provider. °· Ask your health care provider when it is okay to: °¨ Return to work or school. °¨ Resume usual physical activities or sports. °¨ Resume sexual activity. °· Do not drive home if you are discharged the same day as the procedure. Have someone else drive you. °· You may drive 24 hours after the procedure unless otherwise  instructed by your health care provider. °· Do not operate machinery or power tools for 24 hours after the procedure. °· If your procedure was done as an outpatient procedure, which means that you went home the same day as your procedure, a responsible adult should be with you for the first 24 hours after you arrive home. °· Keep all follow-up visits as directed by your health care provider. This is important. °SEEK MEDICAL CARE IF: °· You have a fever. °· You have chills °· You have increased bleeding from the radial site. Hold pressure on the site. °SEEK IMMEDIATE MEDICAL CARE IF: °· You have unusual pain at the radial site. °· You have redness, warmth, or swelling at the radial site. °· You have drainage (other than a small amount of blood on the dressing) from the radial site. °· The radial site is bleeding, and the bleeding does not stop after 30 minutes of holding steady pressure on the site. CALL 911 °· Your arm or hand becomes pale, cool, tingly, or numb. °  °This information is not intended to replace advice given to you by your health care provider. Make sure you discuss any questions you have with your health care provider. °  °Document Released: 02/05/2010 Document Revised: 01/24/2014 Document Reviewed: 07/22/2013 °Elsevier Interactive Patient Education ©2016 Elsevier Inc. ° °

## 2015-08-12 NOTE — Interval H&P Note (Signed)
Cath Lab Visit (complete for each Cath Lab visit)  Clinical Evaluation Leading to the Procedure:   ACS: No.  Non-ACS:    Anginal Classification: CCS III  Anti-ischemic medical therapy: Minimal Therapy (1 class of medications)  Non-Invasive Test Results: High risk  Prior CABG: No previous CABG      History and Physical Interval Note:  08/12/2015 1:48 PM  Vicki [name]  has presented today for surgery, with the diagnosis of cp  The various methods of treatment have been discussed with the patient and family. After consideration of risks, benefits and other options for treatment, the patient has consented to  Procedure(s): Left Heart Cath and Coronary Angiography (N/A) as a surgical intervention .  The patient's history has been reviewed, patient examined, no change in status, stable for surgery.  I have reviewed the patient's chart and labs.  Questions were answered to the patient's satisfaction.     Larae Grooms

## 2015-08-13 ENCOUNTER — Encounter (HOSPITAL_COMMUNITY): Payer: Self-pay | Admitting: Interventional Cardiology

## 2015-08-13 ENCOUNTER — Ambulatory Visit: Payer: BLUE CROSS/BLUE SHIELD | Admitting: Interventional Cardiology

## 2015-09-02 ENCOUNTER — Telehealth: Payer: Self-pay | Admitting: Certified Nurse Midwife

## 2015-09-02 NOTE — Telephone Encounter (Signed)
Spoke with patient. Patient is asking if our office placed an order for her to have a mammogram performed. Patient denies having any breast problems. Reports she received a bill from [facility] from March and thought this was for a mammogram. Patient was last seen in our office in 10/2014. Advised Randell Loop would be for laboratory testing. The patient is agreeable and will contact the number at the top of her form for further information.  Routing to provider for final review. Patient agreeable to disposition. Will close encounter.

## 2015-09-02 NOTE — Telephone Encounter (Signed)
Patient wants to find out if [facility] did an order for her mammogram.

## 2015-09-09 ENCOUNTER — Encounter: Payer: Self-pay | Admitting: Certified Nurse Midwife

## 2015-09-09 ENCOUNTER — Ambulatory Visit (INDEPENDENT_AMBULATORY_CARE_PROVIDER_SITE_OTHER): Payer: BLUE CROSS/BLUE SHIELD | Admitting: Certified Nurse Midwife

## 2015-09-09 VITALS — BP 100/70 | HR 98 | Resp 16 | Ht 62.0 in | Wt 167.0 lb

## 2015-09-09 DIAGNOSIS — Z1159 Encounter for screening for other viral diseases: Secondary | ICD-10-CM | POA: Diagnosis not present

## 2015-09-09 DIAGNOSIS — Z01419 Encounter for gynecological examination (general) (routine) without abnormal findings: Secondary | ICD-10-CM | POA: Diagnosis not present

## 2015-09-09 DIAGNOSIS — Z0000 Encounter for general adult medical examination without abnormal findings: Secondary | ICD-10-CM

## 2015-09-09 LAB — HIV ANTIBODY (ROUTINE TESTING W REFLEX): HIV 1&2 Ab, 4th Generation: NONREACTIVE

## 2015-09-09 NOTE — Progress Notes (Signed)
65 y.o. G63P3003 Married  Caucasian Fe here for annual exam. Menopausal no HRT. Denies vaginal bleeding or vaginal dryness. Now seeing Psychiatrist Winferd Humphrey with good results with counseling and antidepressant use. "They actually pray with me also". Has now stopped drinking. "realize that I would have died if continued".  Sees PCP Dr. Tamala Julian for hypertension/ GERD, labs and aex. Feels she is making progress.. Had cardiology evaluate due to GERD and all WNL. Now on GERD medication. STD screening desired. No other health concerns today. Plans to take care of self for a change.  Patient's last menstrual period was 07/18/2011.          Sexually active: Yes.    The current method of family planning is post menopausal status.    Exercising: No.  Walking Smoker:  former  Health Maintenance: Pap:  09/03/14 Neg  MMG:  10/14/14 Implants BIRADS1:Neg Colonoscopy:  Never BMD:   10/14/14 Osteopenia  TDaP:  Unsure will check with PCP Shingles: No Pneumonia: No Hep C and HIV: Done  Labs: Cardiology   reports that she has quit smoking. She has never used smokeless tobacco. She reports that she drinks about 2.4 oz of alcohol per week . She reports that she does not use drugs.  Past Medical History:  Diagnosis Date  . Alcohol intoxication ([place]) 05/2014    ED note   . Anxiety   . Hyperlipidemia   . Migraines    with aura  . 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 Osteopenia     Past Surgical History:  Procedure Laterality Date  . BREAST ENHANCEMENT SURGERY    . CARDIAC CATHETERIZATION N/A 08/12/2015   Procedure: Left Heart Cath and Coronary Angiography;  Surgeon: Jettie Booze, MD;  Location: [facility];  Service: Cardiovascular;  Laterality: N/A;  . FACIAL COSMETIC SURGERY    . HAND SURGERY     nerve & tendon repair  . LUMBAR EPIDURAL INJECTION  06/16/2015    Current Outpatient Prescriptions  Medication Sig Dispense Refill  . aspirin 325 MG tablet Take 325 mg by  mouth at bedtime.     . calcium-vitamin D (OSCAL WITH D) 500-200 MG-UNIT tablet Take 1 tablet by mouth daily with breakfast.     . cholecalciferol (VITAMIN D) 1000 units tablet Take 4,000 Units by mouth 2 (two) times daily.    . cyanocobalamin (,VITAMIN B-12,) 1000 MCG/ML injection Inject 1,000 mcg into the muscle every 30 (thirty) days. Last injection 05/23/2014    . dimenhyDRINATE (DRAMAMINE) 50 MG tablet Take 50 mg by mouth every 8 (eight) hours as needed for nausea.    Marland Kitchen doxepin (SINEQUAN) 10 MG capsule Take 10 mg by mouth at bedtime.    Marland Kitchen MAGNESIUM PO Take 400 mg by mouth daily.     . Multiple Vitamins-Calcium (DAILY VITAMINS FOR WOMEN PO) Take by mouth.    . naproxen sodium (ANAPROX) 220 MG tablet Take 220-440 mg by mouth See admin instructions. Takes 1 tab twice daily, can take addt'l tab as needed for pain    . Omega-3 Fatty Acids (FISH OIL) 1000 MG CAPS Take 1 capsule (1,000 mg total) by mouth 2 (two) times daily. 60 capsule 0  . ranitidine (ZANTAC) 75 MG tablet Take 75 mg by mouth daily as needed for heartburn.    . sertraline (ZOLOFT) 100 MG tablet Take 150 mg by mouth daily.     . valsartan (DIOVAN) 320 MG tablet Take 320 mg  by mouth at bedtime.     No current facility-administered medications for this visit.     Family History  Problem Relation Age of Onset  . Stroke Father   . Depression Father   . Stroke Mother   . Hypertension Brother   . Heart disease Brother     stint    ROS:  Pertinent items are noted in HPI.  Otherwise, a comprehensive ROS was negative.  Exam:   BP 100/70 (BP Location: Right Arm, Patient Position: Sitting, Cuff Size: Large)   Pulse 98   Resp 16   Ht 5\' 2"  (1.575 m)   Wt 167 lb (75.8 kg)   LMP 07/18/2011   BMI 30.54 kg/m  Height: 5\' 2"  (157.5 cm) Ht Readings from Last 3 Encounters:  09/09/15 5\' 2"  (1.575 m)  08/12/15 5\' 3"  (1.6 m)  08/05/15 5\' 3"  (1.6 m)    General appearance: alert, cooperative and appears stated age Head: Normocephalic,  without obvious abnormality, atraumatic Neck: no adenopathy, supple, symmetrical, trachea midline and thyroid normal to palpation Lungs: clear to auscultation bilaterally Breasts: normal appearance, no masses or tenderness, No nipple retraction or dimpling, No nipple discharge or bleeding, No axillary or supraclavicular adenopathy, implant noted intact Heart: regular rate and rhythm Abdomen: soft, non-tender; no masses,  no organomegaly Extremities: extremities normal, atraumatic, no cyanosis or edema Skin: Skin color, texture, turgor normal. No rashes or lesions Lymph nodes: Cervical, supraclavicular, and axillary nodes normal. No abnormal inguinal nodes palpated Neurologic: Grossly normal   Pelvic: External genitalia:  no lesions              Urethra:  normal appearing urethra with no masses, tenderness or lesions              Bartholin's and Skene's: normal                 Vagina: normal appearing vagina with normal color and discharge, no lesions              Cervix: no cervical motion tenderness, no lesions and normal appearance              Pap taken: No. Bimanual Exam:  Uterus:  normal size, contour, position, consistency, mobility, non-tender              Adnexa: normal adnexa and no mass, fullness, tenderness               Rectovaginal: Confirms               Anus:  normal sphincter tone, no lesions  Chaperone present: yes  A:  Well Woman with normal exam  Menopausal no HRT  Hypertension and GERD with PCP C.Smith management  Depression/alcoholism with Psychiatrist management  Chronic back pain with orthopedic management  Screening labs  P:   Reviewed health and wellness pertinent to exam  Discussed importance of notifying if vaginal bleeding.  Continue follow up with MD as indicated and stay on the no alcohol use path!  Labs: HIV,RPR Hep C, Vitamin D, Affirm, GC,Chlamydia  Pap smear as above not taken   counseled on breast self exam, mammography screening, STD  prevention, HIV risk factors and prevention, adequate intake of calcium and vitamin D, diet and exercise return annually or prn  An After Visit Summary was printed and given to the patient.

## 2015-09-09 NOTE — Patient Instructions (Signed)

## 2015-09-10 ENCOUNTER — Other Ambulatory Visit: Payer: Self-pay | Admitting: Certified Nurse Midwife

## 2015-09-10 DIAGNOSIS — E559 Vitamin D deficiency, unspecified: Secondary | ICD-10-CM

## 2015-09-10 DIAGNOSIS — N760 Acute vaginitis: Principal | ICD-10-CM

## 2015-09-10 DIAGNOSIS — B9689 Other specified bacterial agents as the cause of diseases classified elsewhere: Secondary | ICD-10-CM

## 2015-09-10 LAB — HEPATITIS C ANTIBODY: HCV AB: REACTIVE — AB

## 2015-09-10 LAB — VITAMIN D 25 HYDROXY (VIT D DEFICIENCY, FRACTURES): Vit D, 25-Hydroxy: 29 ng/mL — ABNORMAL LOW (ref 30–100)

## 2015-09-10 LAB — WET PREP BY MOLECULAR PROBE
CANDIDA SPECIES: NEGATIVE
Gardnerella vaginalis: POSITIVE — AB
Trichomonas vaginosis: NEGATIVE

## 2015-09-10 LAB — RPR

## 2015-09-10 MED ORDER — METRONIDAZOLE 0.75 % VA GEL: 1.0000 | Freq: Two times a day (BID) | VAGINAL | 0 refills | Status: DC

## 2015-09-11 ENCOUNTER — Telehealth: Payer: Self-pay | Admitting: Certified Nurse Midwife

## 2015-09-11 LAB — HEPATITIS C RNA QUANTITATIVE: HCV QUANT: NOT DETECTED [IU]/mL (ref <15)

## 2015-09-11 LAB — IPS N GONORRHOEA AND CHLAMYDIA BY PCR

## 2015-09-11 NOTE — Telephone Encounter (Signed)
Patient says she is returning a call from this morning.

## 2015-09-11 NOTE — Telephone Encounter (Signed)
Left detailed message at number provided [phone], okay per ROI. Advised results have been sent to patient via mychart from [facility]. Advised not urgent and does not need to worry. Advised may review results and messages from [facility] today via mychart. Advised to contact the office Monday with any further questions.  Notes Recorded by Regina Eck, CNM on 09/11/2015 at 4:49 PM EDT Good afternoon Thayer Headings, Your GC and chlamydia culture was negative Debbi  Notified by My Chart ------  Notes Recorded by Summer D O'Neal, CMA on 09/11/2015 at 9:23 AM EDT Patients voicemail is full and no message can be left. -sco ------  Notes Recorded by Summer D O'Neal, CMA on 09/10/2015 at 4:06 PM EDT Tried to contact patient on cell phone [phone] (Per DPR). Voicemail is full. Will try to call her back later. -sco ------  Notes Recorded by Regina Eck, CNM on 09/10/2015 at 7:53 AM EDT Notify patient that HIV and RPR are negative Vitamin D is low needs to take Vitamin D 3 OTC 1000 IU daily and recheck in 3 months order placed please schedule Wet prep showed negative for yeast and trichomonas and positive for BV. Rx Metrogel order sent One applicator bid x 5 days vaginally. May have increase discharge after use, but this is normal. Should be cleared by two weeks. Gc,CHlamydia and Hep C are pending

## 2015-09-12 NOTE — Progress Notes (Signed)
Encounter reviewed Maciah Feeback, MD   

## 2015-09-14 NOTE — Telephone Encounter (Signed)
Spoke with patient. Advised of all results as seen below. The patient is agreeable and verbalizes understanding. 3 month lab recheck scheduled for 12/16/2015 at 3:30 pm. Patient is agreeable to date and time.  Notes Recorded by Summer D O'Neal, CMA on 09/14/2015 at 2:09 PM EDT Left detailed message on voicemail (per DPR) of results. -sco ------  Notes Recorded by Summer D O'Neal, CMA on 09/14/2015 at 2:08 PM EDT Left detailed message on voicemail (per DPR) of results. -sco ------  Notes Recorded by Summer D O'Neal, CMA on 09/14/2015 at 2:08 PM EDT Left detailed message on voicemail (per DPR) of results and recommendations. Advised patient to call office to schedule lab appointment for 3 months to check Vit D levels. -sco ------  Notes Recorded by Kem Boroughs, FNP on 09/13/2015 at 11:26 AM EDT Results via my chart: The first test was reactive and the extended test RNA was negative.  Thayer Headings, The Hep C test is negative.  Routing to provider for final review. Patient agreeable to disposition. Will close encounter.

## 2015-10-06 ENCOUNTER — Ambulatory Visit (INDEPENDENT_AMBULATORY_CARE_PROVIDER_SITE_OTHER): Payer: BLUE CROSS/BLUE SHIELD | Admitting: Interventional Cardiology

## 2015-10-06 ENCOUNTER — Encounter: Payer: Self-pay | Admitting: Interventional Cardiology

## 2015-10-06 VITALS — BP 120/80 | HR 92 | Ht 63.0 in | Wt 169.0 lb

## 2015-10-06 DIAGNOSIS — E781 Pure hyperglyceridemia: Secondary | ICD-10-CM

## 2015-10-06 DIAGNOSIS — R079 Chest pain, unspecified: Secondary | ICD-10-CM

## 2015-10-06 NOTE — Progress Notes (Signed)
Cardiology Office Note   Date:  10/06/2015   ID:  Vicki [name], DOB [birth year], MRN [id number]  PCP:  Reginia Naas, MD    No chief complaint on file. f/u cath   [facility] Readings from Last 3 Encounters:  10/06/15 169 lb (76.7 kg)  09/09/15 167 lb (75.8 kg)  08/12/15 159 lb (72.1 kg)       History of Present Illness: Vicki [name] is a 65 y.o. female  *who had chest pain and an abnormal stress test.  SHe had  acath with nonobstructive CAD.  She is not walking due to fatigue and persistent nausea.  No chest pain.  She does have GERD sx.      Past Medical History:  Diagnosis Date  . Alcohol intoxication (Vicki [name]) 05/2014    ED note   . Anxiety   . Hyperlipidemia   . Migraines    with aura  . 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 Osteopenia     Past Surgical History:  Procedure Laterality Date  . BREAST ENHANCEMENT SURGERY    . CARDIAC CATHETERIZATION N/A 08/12/2015   Procedure: Left Heart Cath and Coronary Angiography;  Surgeon: Jettie Booze, MD;  Location: [facility];  Service: Cardiovascular;  Laterality: N/A;  . FACIAL COSMETIC SURGERY    . HAND SURGERY     nerve & tendon repair  . LUMBAR EPIDURAL INJECTION  06/16/2015     Current Outpatient Prescriptions  Medication Sig Dispense Refill  . aspirin 325 MG tablet Take 325 mg by mouth at bedtime.     . calcium-vitamin D (OSCAL WITH D) 500-200 MG-UNIT tablet Take 1 tablet by mouth daily with breakfast.     . cholecalciferol (VITAMIN D) 1000 units tablet Take 4,000 Units by mouth 2 (two) times daily.    . cyanocobalamin (,VITAMIN B-12,) 1000 MCG/ML injection Inject 1,000 mcg into the muscle every 30 (thirty) days. Last injection 05/23/2014    . dimenhyDRINATE (DRAMAMINE) 50 MG tablet Take 50 mg by mouth every 8 (eight) hours as needed for nausea.    Marland Kitchen doxepin (SINEQUAN) 150 MG capsule Take 150 mg by mouth at bedtime.    Marland Kitchen MAGNESIUM PO Take 400 mg by mouth daily.     . Multiple  Vitamins-Calcium (DAILY VITAMINS FOR WOMEN PO) Take 1 tablet by mouth daily.     . naproxen sodium (ANAPROX) 220 MG tablet Take 220-440 mg by mouth See admin instructions. Takes 1 tab twice daily, can take addt'l tab as needed for pain    . Omega-3 Fatty Acids (FISH OIL) 1000 MG CAPS Take 1 capsule (1,000 mg total) by mouth 2 (two) times daily. 60 capsule 0  . ranitidine (ZANTAC) 75 MG tablet Take 75 mg by mouth daily as needed for heartburn.    . sertraline (ZOLOFT) 100 MG tablet Take 150 mg by mouth daily. Take 1 and 1/2 tab by mouth daily to total 150mg     . valsartan (DIOVAN) 320 MG tablet Take 320 mg by mouth at bedtime.     No current facility-administered medications for this visit.     Allergies:   Codeine; Crestor [rosuvastatin]; Other; and Simvastatin    Social History:  The patient  reports that she has quit smoking. She has never used smokeless tobacco. She reports that she drinks about 2.4 oz of alcohol per week . She reports that she does not use drugs.   Family History:  The  patient's family history includes Depression in her father; Heart attack in her brother, brother, maternal grandmother, maternal uncle, maternal uncle, maternal uncle, maternal uncle, and maternal uncle; Heart disease in her brother; Hypertension in her brother; Stroke in her father and mother.    ROS:  Please see the history of present illness.   Otherwise, review of systems are positive for nausea.   All other systems are reviewed and negative.    PHYSICAL EXAM: VS:  BP 120/80   Pulse 92   Ht 5\' 3"  (1.6 m)   Wt 169 lb (76.7 kg)   LMP 07/18/2011   BMI 29.94 kg/m  , BMI Body mass index is 29.94 kg/m. GEN: Well nourished, well developed, in no acute distress  HEENT: normal  Neck: no JVD, carotid bruits, or masses Cardiac: RRR; no murmurs, rubs, or gallops,no edema ; 2+ right radial pulse Respiratory:  clear to auscultation bilaterally, normal work of breathing GI: soft, nontender, nondistended, +  BS MS: no deformity or atrophy  Skin: warm and dry, no rash Neuro:  Strength and sensation are intact Psych: euthymic mood, full affect     Recent Labs: 07/23/2015: ALT 15; TSH 4.01 08/12/2015: BUN 24; Creatinine, Ser 0.97; Hemoglobin 14.1; Platelets 208; Potassium 5.1; Sodium 132   Lipid Panel    Component Value Date/Time   CHOL 235 (H) 05/25/2014 0535   TRIG 352 (H) 05/25/2014 0535   HDL 38 (L) 05/25/2014 0535   CHOLHDL 6.2 05/25/2014 0535   VLDL 70 (H) 05/25/2014 0535   LDLCALC 127 (H) 05/25/2014 0535     Other studies Reviewed: Additional studies/ records that were reviewed today with results demonstrating: cath record reviewed.   ASSESSMENT AND PLAN:  1. Chest pain: noncardiac. Continue Rx for GERD.  2. Hypertriglyceridemia:  Continue fish oil and dietary changes. 3. Continue preventive therapy.   Current medicines are reviewed at length with the patient today.  The patient concerns regarding her medicines were addressed.  The following changes have been made:  No change  Labs/ tests ordered today include:  No orders of the defined types were placed in this encounter.   Recommend 150 minutes/week of aerobic exercise Low fat, low carb, high fiber diet recommended  Disposition:   FU prn   Signed, Larae Grooms, MD  10/06/2015 10:43 AM    [facility] [place], [place], [place]  [address] Phone: [phone]; Fax: [phone]

## 2015-10-06 NOTE — Patient Instructions (Signed)
Medication Instructions:  Your physician recommends that you continue on your current medications as directed. Please refer to the Current Medication list given to you today.   Labwork: None   Testing/Procedures: None   Follow-Up: Your physician recommends that you schedule a follow-up appointment as needed with Dr Irish Lack.     If you need a refill on your cardiac medications before your next appointment, please call your pharmacy.

## 2015-10-07 DIAGNOSIS — E781 Pure hyperglyceridemia: Secondary | ICD-10-CM | POA: Insufficient documentation

## 2015-10-07 DIAGNOSIS — E785 Hyperlipidemia, unspecified: Secondary | ICD-10-CM | POA: Insufficient documentation

## 2015-10-07 HISTORY — DX: Pure hyperglyceridemia: E78.1

## 2015-10-14 ENCOUNTER — Telehealth: Payer: Self-pay | Admitting: Certified Nurse Midwife

## 2015-10-14 NOTE — Telephone Encounter (Signed)
Patient called states the infection is not getting better. The suppository did not work.  [facility] in [place]

## 2015-10-14 NOTE — Telephone Encounter (Signed)
Spoke with patient. Patient states was recently treated for BV with metrogel. Patient states started the [facility] 09/14/15. Patient states symptoms never went away. Patient reports clear vaginal discharge with odor and burning. Denies urinary symptoms. Patient states she has been doing salt water baths with no relief. Patient states she was told to call if symptoms don't improve. Verified pharmacy on file. Advised will review with Melvia Heaps, CNM and return call. Patient is agreeable. Last AEX 09/09/15.   Melvia Heaps, please advise?

## 2015-10-15 NOTE — Telephone Encounter (Signed)
Spoke with patient. Advised of Vicki [name], CNM recommendations as seen below. Patient states she only has a driver on Wednesdays. Recommended patient be seen sooner, patient declined available appointments.  Patient scheduled for 10/21/15 at 12:45pm with Vicki [name]. Advised to call office for any additional questions/concerns. Patient verbalizes understanding and is agreeable.   Routing to provider for final review. Patient is agreeable to disposition. Will close encounter.

## 2015-10-15 NOTE — Telephone Encounter (Signed)
It has been a month since seen. Will need ov to check. She requires a longer visit usually, schedule before 4 pm

## 2015-10-19 ENCOUNTER — Telehealth: Payer: Self-pay | Admitting: Certified Nurse Midwife

## 2015-10-19 NOTE — Telephone Encounter (Signed)
Patient canceled her upcoming appointment 10/21/15 for "BV symptoms" via the automated reminder call. I left the patient a message to call and reschedule if she wishes.

## 2015-10-20 NOTE — Telephone Encounter (Signed)
Spoke with patient. Patient states she continue to have small amount of milky discharge and intermittent burning on inner labia. Patient states she has small "pus filled boils" that are on "groin" area. Patient states she has been doing salt water baths and "used old tube of metrogel" with no relief. Denies urinary symptoms. Patient canceled upcoming appointment for 10/21/15 at 12:45 due to not having transportation. See previous telephone encounter dated 10/14/15. Advised patient of recommendations per Melvia Heaps from previous telephone encounter. Patient states daughter is not available until after 7pm. Advised patient she could go to local urgent care or ER if hours meet her transportation needs. Advised I would review with Melvia Heaps and return call for any additional recommendations. Patient verbalizes understanding and is agreeable.    Melvia Heaps, any additional recommendations?

## 2015-10-20 NOTE — Telephone Encounter (Signed)
Patient is having some symptoms "feels as if she is on fire'. Patient canceled her upcoming appointment 10/21/15 for BV symptoms. Patient is "just too over booked to come in for an appointment".  Patient thinks she may have a boil near her vagina. Patient is asking for a refill for this issue due to her last being seen Aug. 2017.

## 2015-10-20 NOTE — Telephone Encounter (Signed)
Spoke with patient, advised as seen below per Regina Eck recommendations. Patient declined scheduling OV. Patient states she will check with her daughter again and see what she can do about transportation. Patient states she is going to [place] and [place] 11/10/15 and needs to "get this addressed". Advised patient again, if she could not be seen in office she may be seen at Urgent Care or ER. Patient verbalizes understanding.   Routing to provider for final review. Patient is agreeable to disposition. Will close encounter.

## 2015-10-20 NOTE — Telephone Encounter (Signed)
She needs to be seen.

## 2015-10-21 ENCOUNTER — Ambulatory Visit: Payer: Self-pay | Admitting: Certified Nurse Midwife

## 2015-11-10 ENCOUNTER — Encounter: Payer: Self-pay | Admitting: Certified Nurse Midwife

## 2015-12-16 ENCOUNTER — Other Ambulatory Visit: Payer: BLUE CROSS/BLUE SHIELD

## 2015-12-18 DIAGNOSIS — N950 Postmenopausal bleeding: Secondary | ICD-10-CM | POA: Insufficient documentation

## 2015-12-18 HISTORY — DX: Postmenopausal bleeding: N95.0

## 2015-12-23 ENCOUNTER — Other Ambulatory Visit: Payer: BLUE CROSS/BLUE SHIELD

## 2015-12-25 DIAGNOSIS — IMO0002 Reserved for concepts with insufficient information to code with codable children: Secondary | ICD-10-CM

## 2015-12-25 HISTORY — DX: Reserved for concepts with insufficient information to code with codable children: IMO0002

## 2015-12-30 ENCOUNTER — Encounter: Payer: Self-pay | Admitting: Obstetrics and Gynecology

## 2015-12-30 ENCOUNTER — Other Ambulatory Visit: Payer: BLUE CROSS/BLUE SHIELD

## 2015-12-30 ENCOUNTER — Ambulatory Visit (INDEPENDENT_AMBULATORY_CARE_PROVIDER_SITE_OTHER): Payer: BLUE CROSS/BLUE SHIELD | Admitting: Obstetrics and Gynecology

## 2015-12-30 VITALS — BP 124/72 | HR 66 | Ht 62.0 in | Wt 173.2 lb

## 2015-12-30 DIAGNOSIS — N950 Postmenopausal bleeding: Secondary | ICD-10-CM | POA: Diagnosis not present

## 2015-12-30 DIAGNOSIS — E559 Vitamin D deficiency, unspecified: Secondary | ICD-10-CM | POA: Diagnosis not present

## 2015-12-30 NOTE — Progress Notes (Signed)
GYNECOLOGY  VISIT   HPI: 65 y.o.   Married  Caucasian  female   [phone] with Patient's last menstrual period was 07/18/2011.   here for postmenopausal bleeding. Patient states began spotting on 12-25-15.    Had spotting for 2 days.  No pain.   No menses since 2013.  No HRT currently.  Thinks she also has lymph nodes in her groin area that are draining.   Had postmenopausal bleeding 4 - 5 years ago.  Saw Dr. [name] Heck and had an ultrasound and uncertain if she had a biopsy.  No surgical care.   Some pain with deep inspiration if her left chest.  Had a nuclear stress test a few months ago.   Having hot flashes at night.   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  Had excision of leg lesion last week.  PCP - Dr. Jeremy Johann.   GYNECOLOGIC HISTORY: Patient's last menstrual period was 07/18/2011. Contraception:  Postmenopausal Menopausal hormone therapy:  none Last mammogram:  12-25-15 3D/Density B/Neg/BiRads1:[facility] Last pap smear:  09-03-14 Negative        OB History    Gravida Para Term Preterm AB Living   3 3 3     3    SAB TAB Ectopic Multiple Live Births           3         Patient Active Problem List   Diagnosis Date Noted  . Hypertriglyceridemia 10/07/2015  . Abnormal nuclear stress test   . Chest pain 07/23/2015  . Palpitations 07/23/2015  . Depression   . Left-sided weakness 05/24/2014  . Hyponatremia 05/24/2014  . Right arm numbness 05/24/2014  . Alcohol abuse 05/24/2014  . Alcohol abuse with intoxication ([place]) 05/24/2014  . Syncope and collapse 05/24/2014  . CVA (cerebral infarction)   . Essential hypertension   . Hyperlipidemia 04/04/2013    Past Medical History:  Diagnosis Date  . Alcohol intoxication ([place]) 05/2014    ED note   . Anxiety   . Hyperlipidemia   . Migraines    with aura  . Mini stroke ([place]) 05/2014   with paralysis for 1 hr  . Osteopenia   . Squamous cell carcinoma 12/25/2015   right leg and nose    Past  Surgical History:  Procedure Laterality Date  . BREAST ENHANCEMENT SURGERY    . CARDIAC CATHETERIZATION N/A 08/12/2015   Procedure: Left Heart Cath and Coronary Angiography;  Surgeon: Jettie Booze, MD;  Location: [facility];  Service: Cardiovascular;  Laterality: N/A;  . FACIAL COSMETIC SURGERY    . HAND SURGERY     nerve & tendon repair  . LUMBAR EPIDURAL INJECTION  06/16/2015    Current Outpatient Prescriptions  Medication Sig Dispense Refill  . aspirin 325 MG tablet Take 325 mg by mouth at bedtime.     . calcium-vitamin D (OSCAL WITH D) 500-200 MG-UNIT tablet Take 1 tablet by mouth daily with breakfast.     . cholecalciferol (VITAMIN D) 1000 units tablet Take 4,000 Units by mouth 2 (two) times daily.    . cyanocobalamin (,VITAMIN B-12,) 1000 MCG/ML injection Inject 1,000 mcg into the muscle every 30 (thirty) days. Last injection 05/23/2014    . dimenhyDRINATE (DRAMAMINE) 50 MG tablet Take 50 mg by mouth every 8 (eight) hours as needed for nausea.    Marland Kitchen doxepin (SINEQUAN) 150 MG capsule Take 150 mg by mouth at bedtime.    Marland Kitchen MAGNESIUM PO Take 400  mg by mouth daily.     . Multiple Vitamins-Calcium (DAILY VITAMINS FOR WOMEN PO) Take 1 tablet by mouth daily.     . naproxen sodium (ANAPROX) 220 MG tablet Take 220-440 mg by mouth See admin instructions. Takes 1 tab twice daily, can take addt'l tab as needed for pain    . Omega-3 Fatty Acids (FISH OIL) 1000 MG CAPS Take 1 capsule (1,000 mg total) by mouth 2 (two) times daily. 60 capsule 0  . ranitidine (ZANTAC) 75 MG tablet Take 75 mg by mouth daily as needed for heartburn.    . sertraline (ZOLOFT) 100 MG tablet Take 150 mg by mouth daily. Take 1 and 1/2 tab by mouth daily to total 150mg     . valsartan (DIOVAN) 320 MG tablet Take 320 mg by mouth at bedtime.     No current facility-administered medications for this visit.      ALLERGIES: Codeine; Crestor [rosuvastatin]; Other; and Simvastatin  Family History  Problem Relation  Age of Onset  . Stroke Father   . Depression Father   . Stroke Mother   . Hypertension Brother   . Heart disease Brother     stint  . Heart attack Brother   . Heart attack Maternal Uncle   . Heart attack Maternal Grandmother   . Heart attack Brother   . Heart attack Maternal Uncle   . Heart attack Maternal Uncle   . Heart attack Maternal Uncle   . Heart attack Maternal Uncle     Social History   Social History  . Marital status: Married    Spouse name: N/A  . Number of children: N/A  . Years of education: N/A   Occupational History  . Not on file.   Social History Main Topics  . Smoking status: Former Research scientist (life sciences)  . Smokeless tobacco: Never Used  . Alcohol use 2.4 oz/week    4 Glasses of wine per week  . Drug use: No  . Sexual activity: Yes    Partners: Male    Birth control/ protection: Post-menopausal   Other Topics Concern  . Not on file   Social History Narrative  . No narrative on file    ROS:  Pertinent items are noted in HPI.  PHYSICAL EXAMINATION:    BP 124/72 (BP Location: Right Arm, Patient Position: Sitting, Cuff Size: Normal)   Pulse 66   Ht 5\' 2"  (1.575 m)   Wt 173 lb 3.2 oz (78.6 kg)   LMP 07/18/2011   BMI 31.68 kg/m     General appearance: alert, cooperative and appears stated age  Pelvic: External genitalia:  Bilateral vulvar induration - nodes versus inclusion cysts of skin.              Urethra:  normal appearing urethra with no masses, tenderness or lesions              Bartholins and Skenes: normal                 Vagina: normal appearing vagina with normal color and discharge, no lesions              Cervix: no lesions.  Pap taken.                 Bimanual Exam:  Uterus:  normal size, contour, position, consistency, mobility, non-tender              Adnexa: no mass, fullness, tenderness  Rectal exam: Yes.  .  Confirms.              Anus:  normal sphincter tone, no lesions  Chaperone was present for  exam.  ASSESSMENT  Postmenopausal bleeding. Bilateral vulvar lesions - draining per patient. Recent dx of SCC of right leg. Left chest pain with inspiration.    PLAN  Discussion of postmenopausal bleeding and possible etiologies - atrophy, polyp, hyperplasia, cancer. I recommend returning tomorrow for sonohysterogram and endometrial biopsy.  Procedures explained.  Will try to scan areas of the vulva at the same time.  Follow up with PCP for respiratory concerns.   An After Visit Summary was printed and given to the patient.  __15____ minutes face to face time of which over 50% was spent in counseling.

## 2015-12-31 ENCOUNTER — Ambulatory Visit (INDEPENDENT_AMBULATORY_CARE_PROVIDER_SITE_OTHER): Payer: BLUE CROSS/BLUE SHIELD

## 2015-12-31 ENCOUNTER — Ambulatory Visit (INDEPENDENT_AMBULATORY_CARE_PROVIDER_SITE_OTHER): Payer: BLUE CROSS/BLUE SHIELD | Admitting: Obstetrics and Gynecology

## 2015-12-31 ENCOUNTER — Other Ambulatory Visit: Payer: Self-pay | Admitting: Obstetrics and Gynecology

## 2015-12-31 ENCOUNTER — Other Ambulatory Visit: Payer: BLUE CROSS/BLUE SHIELD | Admitting: Obstetrics and Gynecology

## 2015-12-31 ENCOUNTER — Other Ambulatory Visit: Payer: BLUE CROSS/BLUE SHIELD

## 2015-12-31 VITALS — BP 110/72 | HR 78 | Resp 16 | Ht 62.0 in | Wt 173.0 lb

## 2015-12-31 DIAGNOSIS — N950 Postmenopausal bleeding: Secondary | ICD-10-CM

## 2015-12-31 DIAGNOSIS — R938 Abnormal findings on diagnostic imaging of other specified body structures: Secondary | ICD-10-CM | POA: Diagnosis not present

## 2015-12-31 DIAGNOSIS — N9089 Other specified noninflammatory disorders of vulva and perineum: Secondary | ICD-10-CM

## 2015-12-31 DIAGNOSIS — N909 Noninflammatory disorder of vulva and perineum, unspecified: Secondary | ICD-10-CM | POA: Diagnosis not present

## 2015-12-31 DIAGNOSIS — R9389 Abnormal findings on diagnostic imaging of other specified body structures: Secondary | ICD-10-CM

## 2015-12-31 LAB — VITAMIN D 25 HYDROXY (VIT D DEFICIENCY, FRACTURES): Vit D, 25-Hydroxy: 35 ng/mL (ref 30–100)

## 2015-12-31 NOTE — Progress Notes (Signed)
GYNECOLOGY  VISIT   HPI: 65 y.o.   Married  Caucasian  female   [phone] with Patient's last menstrual period was 07/18/2011.   here for pelvic ultrasound for sonohystogram for postmenopausal bleeding.    "What are we going to do about the draining lymph nodes?"  Patient with recent right thigh excision of squamous cell CA.   GYNECOLOGIC HISTORY: Patient's last menstrual period was 07/18/2011. Contraception:  Post-Menopause Menopausal hormone therapy:  none Last mammogram:  11/05/15 BIRADS1, Density B Last pap smear:   12/30/15-results not back yet. 09/03/14-Neg         OB History    Gravida Para Term Preterm AB Living   3 3 3     3    SAB TAB Ectopic Multiple Live Births           3         Patient Active Problem List   Diagnosis Date Noted  . Hypertriglyceridemia 10/07/2015  . Abnormal nuclear stress test   . Chest pain 07/23/2015  . Palpitations 07/23/2015  . Depression   . Left-sided weakness 05/24/2014  . Hyponatremia 05/24/2014  . Right arm numbness 05/24/2014  . Alcohol abuse 05/24/2014  . Alcohol abuse with intoxication ([place]) 05/24/2014  . Syncope and collapse 05/24/2014  . CVA (cerebral infarction)   . Essential hypertension   . Hyperlipidemia 04/04/2013    Past Medical History:  Diagnosis Date  . Alcohol intoxication ([place]) 05/2014    ED note   . Anxiety   . Hyperlipidemia   . Migraines    with aura  . Mini stroke ([place]) 05/2014   with paralysis for 1 hr  . Osteopenia   . Squamous cell carcinoma 12/25/2015   right leg and nose    Past Surgical History:  Procedure Laterality Date  . BREAST ENHANCEMENT SURGERY    . CARDIAC CATHETERIZATION N/A 08/12/2015   Procedure: Left Heart Cath and Coronary Angiography;  Surgeon: Jettie Booze, MD;  Location: [facility];  Service: Cardiovascular;  Laterality: N/A;  . FACIAL COSMETIC SURGERY    . HAND SURGERY     nerve & tendon repair  . LUMBAR EPIDURAL INJECTION  06/16/2015    Current Outpatient  Prescriptions  Medication Sig Dispense Refill  . aspirin 325 MG tablet Take 325 mg by mouth at bedtime.     . calcium-vitamin D (OSCAL WITH D) 500-200 MG-UNIT tablet Take 1 tablet by mouth daily with breakfast.     . cholecalciferol (VITAMIN D) 1000 units tablet Take 4,000 Units by mouth 2 (two) times daily.    . cyanocobalamin (,VITAMIN B-12,) 1000 MCG/ML injection Inject 1,000 mcg into the muscle every 30 (thirty) days. Last injection 05/23/2014    . dimenhyDRINATE (DRAMAMINE) 50 MG tablet Take 50 mg by mouth every 8 (eight) hours as needed for nausea.    Marland Kitchen doxepin (SINEQUAN) 150 MG capsule Take 150 mg by mouth at bedtime.    Marland Kitchen MAGNESIUM PO Take 400 mg by mouth daily.     . Multiple Vitamins-Calcium (DAILY VITAMINS FOR WOMEN PO) Take 1 tablet by mouth daily.     . naproxen sodium (ANAPROX) 220 MG tablet Take 220-440 mg by mouth See admin instructions. Takes 1 tab twice daily, can take addt'l tab as needed for pain    . Omega-3 Fatty Acids (FISH OIL) 1000 MG CAPS Take 1 capsule (1,000 mg total) by mouth 2 (two) times daily. 60 capsule 0  . ranitidine (ZANTAC) 75 MG tablet Take  75 mg by mouth daily as needed for heartburn.    . sertraline (ZOLOFT) 100 MG tablet Take 150 mg by mouth daily. Take 1 and 1/2 tab by mouth daily to total 150mg     . valsartan (DIOVAN) 320 MG tablet Take 320 mg by mouth at bedtime.     No current facility-administered medications for this visit.      ALLERGIES: Codeine; Crestor [rosuvastatin]; Other; and Simvastatin  Family History  Problem Relation Age of Onset  . Stroke Father   . Depression Father   . Stroke Mother   . Hypertension Brother   . Heart disease Brother     stint  . Heart attack Brother   . Heart attack Maternal Uncle   . Heart attack Maternal Grandmother   . Heart attack Brother   . Heart attack Maternal Uncle   . Heart attack Maternal Uncle   . Heart attack Maternal Uncle   . Heart attack Maternal Uncle     Social History   Social  History  . Marital status: Married    Spouse name: N/A  . Number of children: N/A  . Years of education: N/A   Occupational History  . Not on file.   Social History Main Topics  . Smoking status: Former Research scientist (life sciences)  . Smokeless tobacco: Never Used  . Alcohol use 2.4 oz/week    4 Glasses of wine per week  . Drug use: No  . Sexual activity: Yes    Partners: Male    Birth control/ protection: Post-menopausal   Other Topics Concern  . Not on file   Social History Narrative  . No narrative on file    ROS:  Pertinent items are noted in HPI.  PHYSICAL EXAMINATION:    BP 110/72 (BP Location: Right Arm, Patient Position: Sitting, Cuff Size: Normal)   Pulse 78   Resp 16   Ht 5\' 2"  (1.575 m)   Wt 173 lb (78.5 kg)   LMP 07/18/2011   BMI 31.64 kg/m     General appearance: alert, cooperative and appears stated age  Pelvic: External genitalia:   Vulvar induration in lateral margins.  Appears to be deep in subcutaneous tissue.           Pelvic ultrasound: Uterus no masses.  EMS 9.66 mm with cystic change and suspected 1.1 cm mass and feeder vessel. Ovaries atrophic.  No free fluid.   Procedure - attempted sonohysterogram and successful EMB: Consent performed. Speculum placed in vagina. Sterile prep of cervix with betadine. Tenaculum placed on anterior cervical lip. Unable to place cannula.     Small Pipelle placed to almost 7 cm without difficulty twice. Tissue obtained and sent to pathology. Speculum removed.  No complications. Minimal EBL.  Chaperone was present for exam.  ASSESSMENT  Postmenopausal bleeding.  Endometrial mass.  Subcutaneous vulvar nodules - nodes draining? Recent dx of squamous cell CA of right thigh.   PLAN  Discussion of postmenopausal bleeding and endometrial mass. Will follow up on EMB results.  Discussion of potential hysteroscopy with Myosure polypectomy, dilation and curettage.  ACOG handouts given. Risks, benefits, and alternatives  reviewed. Risks include but are not limited to bleeding, infection, damage to surrounding organs including uterine perforation requiring hospitalization and laparoscopy, reaction to anesthesia, DVT, PE, death, need for further treatment and surgery including repeat hysteroscopy, hysterectomy or medical therapy.   Surgical expectations and recovery discussed.  Depending on results of EMB, patient understands that her surgery may be performed by me  or by GYN ONC.  I do have some concern about the vulvar masses and the need for potential CT and biopsy.    An After Visit Summary was printed and given to the patient.  ___25___ minutes face to face time of which over 50% was spent in counseling.

## 2016-01-03 ENCOUNTER — Encounter: Payer: Self-pay | Admitting: Obstetrics and Gynecology

## 2016-01-04 ENCOUNTER — Telehealth: Payer: Self-pay | Admitting: *Deleted

## 2016-01-04 DIAGNOSIS — R591 Generalized enlarged lymph nodes: Secondary | ICD-10-CM

## 2016-01-04 DIAGNOSIS — R599 Enlarged lymph nodes, unspecified: Secondary | ICD-10-CM

## 2016-01-04 NOTE — Telephone Encounter (Signed)
Patient notified of endometrial biopsy result and recommendation for proceeding with additional evaluation with Hysteroscopy/Myosure/D&C. Patient agreeable to proceed and requests it be scheduled before end of year. Advised there are limited options but will see if this is possible. Also advised of need for CT of abd/pelvis as recommended by Dr Quincy Simmonds which may impact surgery options. Will proceed with scheduling and call her back as soon as possible.

## 2016-01-04 NOTE — Telephone Encounter (Signed)
-----   Message from Nunzio Cobbs, MD sent at 01/03/2016  8:00 PM EST ----- Please report results of EMB to patient which are nondiagnostic. She presented with postmenopausal bleeding. She will need a hysteroscopy with dilation and curettage and Myosure resection of the endometrial mass.  I would also like for her to have a CT scan of the abdomen and pelvis to evaluate vulvar adenopathy noted on physical exam.  We discussed this as a possible next step in her care for evaluation of the suspected deep vulvar masses.  She has a recent diagnosis of right thigh squamous cell carcinoma of the skin.  [place], Marisa Sprinkles

## 2016-01-05 ENCOUNTER — Emergency Department (HOSPITAL_COMMUNITY): Payer: BLUE CROSS/BLUE SHIELD

## 2016-01-05 ENCOUNTER — Encounter (HOSPITAL_COMMUNITY): Payer: Self-pay | Admitting: *Deleted

## 2016-01-05 ENCOUNTER — Emergency Department (HOSPITAL_COMMUNITY)
Admission: EM | Admit: 2016-01-05 | Discharge: 2016-01-06 | Disposition: A | Discharge status: Home / Self Care | Payer: BLUE CROSS/BLUE SHIELD | Attending: Emergency Medicine | Admitting: Emergency Medicine

## 2016-01-05 DIAGNOSIS — J020 Streptococcal pharyngitis: Secondary | ICD-10-CM | POA: Insufficient documentation

## 2016-01-05 DIAGNOSIS — K7689 Other specified diseases of liver: Secondary | ICD-10-CM | POA: Diagnosis not present

## 2016-01-05 DIAGNOSIS — Z79899 Other long term (current) drug therapy: Secondary | ICD-10-CM | POA: Diagnosis not present

## 2016-01-05 DIAGNOSIS — Z8673 Personal history of transient ischemic attack (TIA), and cerebral infarction without residual deficits: Secondary | ICD-10-CM | POA: Insufficient documentation

## 2016-01-05 DIAGNOSIS — Z87891 Personal history of nicotine dependence: Secondary | ICD-10-CM | POA: Insufficient documentation

## 2016-01-05 DIAGNOSIS — Z7982 Long term (current) use of aspirin: Secondary | ICD-10-CM | POA: Diagnosis not present

## 2016-01-05 DIAGNOSIS — J189 Pneumonia, unspecified organism: Secondary | ICD-10-CM | POA: Insufficient documentation

## 2016-01-05 DIAGNOSIS — I10 Essential (primary) hypertension: Secondary | ICD-10-CM | POA: Insufficient documentation

## 2016-01-05 DIAGNOSIS — J181 Lobar pneumonia, unspecified organism: Secondary | ICD-10-CM | POA: Insufficient documentation

## 2016-01-05 DIAGNOSIS — R109 Unspecified abdominal pain: Secondary | ICD-10-CM | POA: Diagnosis present

## 2016-01-05 HISTORY — DX: Streptococcal pharyngitis: J02.0

## 2016-01-05 HISTORY — DX: Essential (primary) hypertension: I10

## 2016-01-05 HISTORY — DX: Pneumonia, unspecified organism: J18.9

## 2016-01-05 LAB — CBC WITH DIFFERENTIAL/PLATELET
BASOS ABS: 0.1 10*3/uL (ref 0.0–0.1)
BASOS PCT: 1 %
EOS PCT: 1 %
Eosinophils Absolute: 0.1 10*3/uL (ref 0.0–0.7)
HEMATOCRIT: 40.8 % (ref 36.0–46.0)
Hemoglobin: 14.3 g/dL (ref 12.0–15.0)
Lymphocytes Relative: 21 %
Lymphs Abs: 2.3 10*3/uL (ref 0.7–4.0)
MCH: 32.4 pg (ref 26.0–34.0)
MCHC: 35 g/dL (ref 30.0–36.0)
MCV: 92.3 fL (ref 78.0–100.0)
MONO ABS: 0.5 10*3/uL (ref 0.1–1.0)
MONOS PCT: 4 %
NEUTROS ABS: 8 10*3/uL — AB (ref 1.7–7.7)
Neutrophils Relative %: 73 %
PLATELETS: 227 10*3/uL (ref 150–400)
RBC: 4.42 MIL/uL (ref 3.87–5.11)
RDW: 13.2 % (ref 11.5–15.5)
WBC: 10.9 10*3/uL — ABNORMAL HIGH (ref 4.0–10.5)

## 2016-01-05 LAB — BASIC METABOLIC PANEL
Anion gap: 9 (ref 5–15)
BUN: 13 mg/dL (ref 6–20)
CALCIUM: 9.1 mg/dL (ref 8.9–10.3)
CO2: 25 mmol/L (ref 22–32)
CREATININE: 0.73 mg/dL (ref 0.44–1.00)
Chloride: 103 mmol/L (ref 101–111)
GLUCOSE: 150 mg/dL — AB (ref 65–99)
Potassium: 3.7 mmol/L (ref 3.5–5.1)
Sodium: 137 mmol/L (ref 135–145)

## 2016-01-05 LAB — IPS PAP TEST WITH HPV

## 2016-01-05 MED ORDER — MORPHINE SULFATE (PF) 4 MG/ML IV SOLN
4.0000 mg | Freq: Once | INTRAVENOUS | Status: AC
  Administered 2016-01-05: 4 mg via INTRAVENOUS
  Filled 2016-01-05: qty 1

## 2016-01-05 MED ORDER — ONDANSETRON HCL 4 MG/2ML IJ SOLN
4.0000 mg | Freq: Four times a day (QID) | INTRAMUSCULAR | Status: DC | PRN
  Administered 2016-01-05: 4 mg via INTRAVENOUS
  Filled 2016-01-05: qty 2

## 2016-01-05 MED ORDER — SODIUM CHLORIDE 0.9 % IV BOLUS (SEPSIS)
1000.0000 mL | Freq: Once | INTRAVENOUS | Status: AC
  Administered 2016-01-05: 1000 mL via INTRAVENOUS

## 2016-01-05 MED ORDER — ONDANSETRON HCL 4 MG/2ML IJ SOLN: 4.0000 mg | Freq: Once | INTRAMUSCULAR | Status: DC

## 2016-01-05 MED ORDER — KETOROLAC TROMETHAMINE 15 MG/ML IJ SOLN
15.0000 mg | Freq: Once | INTRAMUSCULAR | Status: AC
  Administered 2016-01-05: 15 mg via INTRAVENOUS
  Filled 2016-01-05: qty 1

## 2016-01-05 NOTE — ED Triage Notes (Signed)
Pt states her  Nausea has gotten better

## 2016-01-05 NOTE — Telephone Encounter (Signed)
I have read these notes and will look for the results of the CT tomorrow.  Cc - Vicki [name]

## 2016-01-05 NOTE — Telephone Encounter (Signed)
Patient is scheduled for CT abdomin pelvis with [facility] on 01/13/16. Per discussion with nurse supervisor, contacted [place] to see if earlier dates are available, the earliest available appointment would be 01/12/16. Patient had requested if surgery required to schedule by the end of the year. With CT not being until 01/13/16, per nurse supervisor, it will not be possible to schedule surgery by the end of the year.  I have conveyed this information to the patient and also advised once CT completed, Dr Quincy Simmonds contact patient with the results and discuss how to proceed.  Patient understood and is agreeable to this information.  Routing to Dr Quincy Simmonds for review  cc: Lamont Snowball

## 2016-01-05 NOTE — Telephone Encounter (Signed)
Just an FYI Patient called and left voice message states she is in extreme pain and that she has a walk-in CT scheduled for 9 am tomorrow and hopes we can get her scheduled for surgery after that.

## 2016-01-05 NOTE — ED Notes (Signed)
ED Provider at bedside. 

## 2016-01-05 NOTE — ED Triage Notes (Signed)
The pt is c/o lt flank pain since this am no bloody urine  Her urine has just been flowing slower today

## 2016-01-05 NOTE — Telephone Encounter (Signed)
Call to patient. States she began having different pain today that is worse than last week. Pain from last week actually went away and new pain started today.  Upper left side of abdomen under ribs, sharp shooting pain through to her back when takes a deep breath due to chest wall movement. Denies chest pain, SOB, jaw pain or arm pain. Reports pain is 10/10 "unless can call it a 12."  States she can not lay down so she cant sleep. Was able to get some slight relief in a hot bath. Denies an GI symptoms. Discussed possible flatus related pain.  Advised that with pain level 12/10, really recommend ED evaluation. CT scan is ordered of abd and pelvis and MAY not be able to cover area/source of pain. Also discussed this does not appear to be GYN issue so will need additional involvement for resolution.  Patient states she has taken medication for sleep and is therefore unable to drive. She is alone till 10 pm. Advised that if medication does not allow her to rest, recommend calling 911 for transport to ED for evaluation. Patient agrees to comply. Advised again my recommendation is for ED evaluation and that Dr Quincy Simmonds will receive update this evening.   Routing to Dr Quincy Simmonds

## 2016-01-05 NOTE — Telephone Encounter (Signed)
Call back to patient. Female answers phone. When patient came to phone, advised that Dr Quincy Simmonds has reviewed call and recommends ED evaluation now and does not want her to wait till tomorrow for CT, she needs to go now to [facility] ED. Patient states she will get dressed and go and does have someone to take her. She is agreeable to instruction to go to ED.   Routing to provider for final review.

## 2016-01-05 NOTE — Telephone Encounter (Signed)
I have read this note.  Thank you for contacting the patient.

## 2016-01-05 NOTE — ED Triage Notes (Signed)
Pt feeling better

## 2016-01-05 NOTE — ED Triage Notes (Signed)
Pt getting dizzy while getting her blood drawn  bp down to 67/46  Head lowered pt alert and talking the entire time and started sweating  C/o a headache

## 2016-01-05 NOTE — Telephone Encounter (Signed)
Care discussed with nursing supervisor.  We are recommending emergency medical care for the patient tonight.  She will be contacted now to proceed with hospital care including EMS services if needed.

## 2016-01-05 NOTE — ED Provider Notes (Signed)
The patient is a 65 year old female, she presents with left flank pain, she felt the same pain 3 weeks ago, it recurred today this morning when she woke up, she has no radiation of this pain but has been nauseated and vomiting several times throughout the day. The pain is not positional, she has no hematuria but does have some decreased urinary output. On exam the patient has no abdominal tenderness, she has mild left CVA tenderness, she has normal heart and lung sounds and does not appear ill. We'll obtain urinalysis labs and a CT scan without contrast to evaluate for possible kidney stone. The patient is in agreement with the plan. She will be given medication including Toradol.  CT scan reviewed, no signs of renal hydronephrosis or ureteral stones or kidney distention or bladder distention or other sources of pain. That being said she did have some groundglass opacities at the base of the lung which could be the cause of her pain. She has no pulmonary complaints. She'll be discharged with a course of antibiotics to treat for potential early community-acquired pneumonia. The patient was given a copy of all of her results and informed of her abnormal findings and encouraged to follow-up closely.  Medical screening examination/treatment/procedure(s) were conducted as a shared visit with non-physician practitioner(s) and myself.  I personally evaluated the patient during the encounter.  Clinical Impression:   Final diagnoses:  Community acquired pneumonia of left lower lobe of lung ([place])  Liver cyst         Noemi Chapel, MD 01/06/16 [phone]

## 2016-01-06 ENCOUNTER — Encounter: Payer: Self-pay | Admitting: Obstetrics and Gynecology

## 2016-01-06 ENCOUNTER — Telehealth: Payer: Self-pay | Admitting: *Deleted

## 2016-01-06 ENCOUNTER — Ambulatory Visit
Admission: RE | Admit: 2016-01-06 | Discharge: 2016-01-06 | Disposition: A | Discharge status: Home / Self Care | Payer: BLUE CROSS/BLUE SHIELD | Source: Ambulatory Visit | Attending: Obstetrics and Gynecology | Admitting: Obstetrics and Gynecology

## 2016-01-06 DIAGNOSIS — K7689 Other specified diseases of liver: Secondary | ICD-10-CM | POA: Insufficient documentation

## 2016-01-06 DIAGNOSIS — R591 Generalized enlarged lymph nodes: Secondary | ICD-10-CM

## 2016-01-06 DIAGNOSIS — R599 Enlarged lymph nodes, unspecified: Secondary | ICD-10-CM

## 2016-01-06 HISTORY — DX: Other specified diseases of liver: K76.89

## 2016-01-06 LAB — URINALYSIS, ROUTINE W REFLEX MICROSCOPIC
BACTERIA UA: NONE SEEN
BILIRUBIN URINE: NEGATIVE
Glucose, UA: NEGATIVE mg/dL
Hgb urine dipstick: NEGATIVE
KETONES UR: 5 mg/dL — AB
Nitrite: NEGATIVE
PH: 6 (ref 5.0–8.0)
PROTEIN: 30 mg/dL — AB
Specific Gravity, Urine: 1.02 (ref 1.005–1.030)

## 2016-01-06 MED ORDER — IOPAMIDOL (ISOVUE-300) INJECTION 61%
100.0000 mL | Freq: Once | INTRAVENOUS | Status: AC | PRN
  Administered 2016-01-06: 100 mL via INTRAVENOUS

## 2016-01-06 MED ORDER — METOCLOPRAMIDE HCL 10 MG PO TABS: 10.0000 mg | ORAL_TABLET | Freq: Three times a day (TID) | ORAL | 0 refills | Status: DC | PRN

## 2016-01-06 MED ORDER — METOCLOPRAMIDE HCL 10 MG PO TABS
10.0000 mg | ORAL_TABLET | Freq: Once | ORAL | Status: AC
  Administered 2016-01-06: 10 mg via ORAL
  Filled 2016-01-06: qty 1

## 2016-01-06 MED ORDER — AZITHROMYCIN 250 MG PO TABS: ORAL_TABLET | ORAL | 0 refills | Status: DC

## 2016-01-06 NOTE — Telephone Encounter (Signed)
Patient family member calls on my cell phone ( I called them from cell number after hours last night to instruct to go to ED.) Female states that they have been unable to get medication from pharmacy that was given in ED last night and they can not reach anyone in ED at [facility]. They need RX to [facility] in [place]. Advised will check with ED and call back.

## 2016-01-06 NOTE — Telephone Encounter (Signed)
Pt and wife misplaced Rx given to them in ED last night.  EDCM called in to [facility] in [place], [place].

## 2016-01-06 NOTE — Telephone Encounter (Signed)
Call to [facility] ED, spoke to case manager [place]. She will contact patient's family and review medication. States medications are generally not called in but are given hard copy in discharge paperwork. She will contact patient and/or family.   Routing to provider for final review. Patient agreeable to disposition. Will close encounter.

## 2016-01-06 NOTE — Telephone Encounter (Signed)
Patient was seen in [place] and treated for pneumonia. To follow-up with PCP in 1 week and also evaluate for liver cyst.

## 2016-01-06 NOTE — ED Provider Notes (Signed)
[facility] DEPT Provider Note   CSN: [id number] Arrival date & time: 01/05/16  2030     History   Chief Complaint Chief Complaint  Patient presents with  . Flank Pain    HPI Vicki [name] is a 65 y.o. female presenting with 1 day of sharp burning stabbing nonradiating left-sided flank pain. She states that she woke up with the pain this morning. She has had a similar pain about 2 weeks ago but it resolved on its own. Today it has been waxing and waning until it became worse tonight. She has tried tramadol and Aleve without relief. She states that when the pain comes on she cannot find any comfortable position and nothing seems to make it better. She denies a history of nephrolithiasis but has a significant family history. She also endorses nausea and vomiting and chills and sweats. She denies chest pain, shortness of breath, abdominal pain, leg swelling, dysuria, hematuria, diarrhea.  HPI  Past Medical History:  Diagnosis Date  . Alcohol intoxication ([place]) 05/2014    ED note   . Anxiety   . Hyperlipidemia   . Hypertension   . Migraines    with aura  . 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 Osteopenia   . Squamous cell carcinoma 12/25/2015   right leg and nose    Patient Active Problem List   Diagnosis Date Noted  . Hypertriglyceridemia 10/07/2015  . Abnormal nuclear stress test   . Chest pain 07/23/2015  . Palpitations 07/23/2015  . Depression   . Left-sided weakness 05/24/2014  . Hyponatremia 05/24/2014  . Right arm numbness 05/24/2014  . Alcohol abuse 05/24/2014  . Alcohol abuse with intoxication ([place]) 05/24/2014  . Syncope and collapse 05/24/2014  . CVA (cerebral infarction)   . Essential hypertension   . Hyperlipidemia 04/04/2013    Past Surgical History:  Procedure Laterality Date  . BREAST ENHANCEMENT SURGERY    . CARDIAC CATHETERIZATION N/A 08/12/2015   Procedure: Left Heart Cath and Coronary Angiography;  Surgeon: Jettie Booze, MD;  Location: [facility];  Service: Cardiovascular;  Laterality: N/A;  . FACIAL COSMETIC SURGERY    . HAND SURGERY     nerve & tendon repair  . LUMBAR EPIDURAL INJECTION  06/16/2015    OB History    Gravida Para Term Preterm AB Living   3 3 3     3    SAB TAB Ectopic Multiple Live Births           3       Home Medications    Prior to Admission medications   Medication Sig Start Date End Date Taking? Authorizing Provider  aspirin 325 MG tablet Take 325 mg by mouth at bedtime.     Historical Provider, MD  azithromycin (ZITHROMAX Z-PAK) 250 MG tablet Take two tablets on day one and one tablet on day 2-5 01/06/16   Emeline General, PA-C  calcium-vitamin D (OSCAL WITH D) 500-200 MG-UNIT tablet Take 1 tablet by mouth daily with breakfast.     Historical Provider, MD  cholecalciferol (VITAMIN D) 1000 units tablet Take 4,000 Units by mouth 2 (two) times daily.    Historical Provider, MD  cyanocobalamin (,VITAMIN B-12,) 1000 MCG/ML injection Inject 1,000 mcg into the muscle every 30 (thirty) days. Last injection 05/23/2014    Historical Provider, MD  dimenhyDRINATE (DRAMAMINE) 50 MG tablet Take 50 mg by mouth every 8 (eight) hours as needed for nausea.  Historical Provider, MD  doxepin (SINEQUAN) 150 MG capsule Take 150 mg by mouth at bedtime.    Historical Provider, MD  MAGNESIUM PO Take 400 mg by mouth daily.     Historical Provider, MD  metoCLOPramide (REGLAN) 10 MG tablet Take 1 tablet (10 mg total) by mouth every 8 (eight) hours as needed for nausea or vomiting. 01/06/16   Emeline General, PA-C  Multiple Vitamins-Calcium (DAILY VITAMINS FOR WOMEN PO) Take 1 tablet by mouth daily.     Historical Provider, MD  naproxen sodium (ANAPROX) 220 MG tablet Take 220-440 mg by mouth See admin instructions. Takes 1 tab twice daily, can take addt'l tab as needed for pain    Historical Provider, MD  Omega-3 Fatty Acids (FISH OIL) 1000 MG CAPS Take 1 capsule (1,000 mg total) by  mouth 2 (two) times daily. 05/25/14   Robbie Lis, MD  ranitidine (ZANTAC) 75 MG tablet Take 75 mg by mouth daily as needed for heartburn.    Historical Provider, MD  sertraline (ZOLOFT) 100 MG tablet Take 150 mg by mouth daily. Take 1 and 1/2 tab by mouth daily to total 150mg     Historical Provider, MD  valsartan (DIOVAN) 320 MG tablet Take 320 mg by mouth at bedtime.    Historical Provider, MD    Family History Family History  Problem Relation Age of Onset  . Stroke Father   . Depression Father   . Stroke Mother   . Hypertension Brother   . Heart disease Brother     stint  . Heart attack Brother   . Heart attack Maternal Uncle   . Heart attack Maternal Grandmother   . Heart attack Brother   . Heart attack Maternal Uncle   . Heart attack Maternal Uncle   . Heart attack Maternal Uncle   . Heart attack Maternal Uncle     Social History Social History  Substance Use Topics  . Smoking status: Former Research scientist (life sciences)  . Smokeless tobacco: Never Used  . Alcohol use 2.4 oz/week    4 Glasses of wine per week     Allergies   Codeine; Crestor [rosuvastatin]; Other; and Simvastatin   Review of Systems Review of Systems  Constitutional: Positive for chills and diaphoresis. Negative for fever.  HENT: Negative for congestion, ear pain and sore throat.   Eyes: Negative for pain and visual disturbance.  Respiratory: Negative for cough, choking, chest tightness, shortness of breath, wheezing and stridor.   Cardiovascular: Negative for chest pain, palpitations and leg swelling.  Gastrointestinal: Positive for nausea and vomiting. Negative for abdominal distention, abdominal pain, blood in stool and diarrhea.  Genitourinary: Positive for decreased urine volume. Negative for dysuria, hematuria and vaginal discharge.  Musculoskeletal: Negative for arthralgias, back pain, gait problem, myalgias, neck pain and neck stiffness.  Skin: Negative for color change, pallor, rash and wound.  Neurological:  Negative for tremors, seizures, syncope, weakness, light-headedness, numbness and headaches.  All other systems reviewed and are negative.    Physical Exam Updated Vital Signs BP 93/73   Pulse 74   Temp 98 F (36.7 C)   Resp 19   Ht 5\' 3"  (1.6 m)   Wt 78.5 kg   LMP 07/18/2011   SpO2 97%   BMI 30.65 kg/m   Physical Exam  Constitutional: She appears well-developed and well-nourished. No distress.  Afebrile nontoxic appearing lying in bed in discomfort  HENT:  Head: Normocephalic and atraumatic.  Eyes: Conjunctivae are normal. Right eye exhibits no discharge.  Left eye exhibits no discharge. No scleral icterus.  Neck: Normal range of motion. Neck supple.  Cardiovascular: Normal rate, regular rhythm and normal heart sounds.   No murmur heard. Pulmonary/Chest: Effort normal and breath sounds normal. No respiratory distress. She has no wheezes. She has no rales. She exhibits no tenderness.  Abdominal: Soft. Bowel sounds are normal. She exhibits no distension and no mass. There is no tenderness. There is no rebound and no guarding.  Patient has left-sided CVA tenderness  Musculoskeletal: She exhibits no edema, tenderness or deformity.  Neurological: She is alert.  Skin: Skin is warm and dry. No rash noted. She is not diaphoretic. No erythema. No pallor.  Psychiatric: She has a normal mood and affect. Her behavior is normal.  Nursing note and vitals reviewed.    ED Treatments / Results  Labs (all labs ordered are listed, but only abnormal results are displayed) Labs Reviewed  URINALYSIS, ROUTINE W REFLEX MICROSCOPIC - Abnormal; Notable for the following:       Result Value   APPearance HAZY (*)    Ketones, ur 5 (*)    Protein, ur 30 (*)    Leukocytes, UA TRACE (*)    Squamous Epithelial / LPF 0-5 (*)    All other components within normal limits  CBC WITH DIFFERENTIAL/PLATELET - Abnormal; Notable for the following:    WBC 10.9 (*)    Neutro Abs 8.0 (*)    All other  components within normal limits  BASIC METABOLIC PANEL - Abnormal; Notable for the following:    Glucose, Bld 150 (*)    All other components within normal limits    EKG  EKG Interpretation None       Radiology Ct Renal Stone Study  Result Date: 01/05/2016 CLINICAL DATA:  Left flank pain since this morning EXAM: CT ABDOMEN AND PELVIS WITHOUT CONTRAST TECHNIQUE: Multidetector CT imaging of the abdomen and pelvis was performed following the standard protocol without IV contrast. COMPARISON:  None. FINDINGS: Lower chest: No pleural effusion is visualized. There is ground-glass density within the peripheral left lower lobe consistent with pneumonia. Hepatobiliary: 9 mm probable cyst in the right hepatic lobe. No calcified gallstones. No biliary dilatation per Pancreas: Unremarkable. No pancreatic ductal dilatation or surrounding inflammatory changes. Spleen: Normal in size without focal abnormality. Adrenals/Urinary Tract: Adrenal glands within normal limits. Nonspecific perinephric fat stranding. No hydronephrosis. No ureteral stones. Bladder is grossly unremarkable. Stomach/Bowel: Pill fragments or radiopaque material in the stomach. No dilated small bowel. Moderate stool in the colon. No wall thickening. Normal appendix. Vascular/Lymphatic: No significant vascular findings are present. No enlarged abdominal or pelvic lymph nodes. Reproductive: Uterus and bilateral adnexa are unremarkable. Other: No abdominal wall hernia or abnormality. No abdominopelvic ascites. Musculoskeletal: No acute or significant osseous findings. IMPRESSION: 1. No CT evidence for nephrolithiasis, hydronephrosis or ureteral stone. 2. Ground-glass density within the peripheral left lower lobe suspicious for pneumonia. Electronically Signed   By: Donavan Foil M.D.   On: 01/05/2016 23:50    Procedures Procedures (including critical care time)  Medications Ordered in ED Medications  ondansetron (ZOFRAN) injection 4 mg (4  mg Intravenous Given 01/05/16 2301)  metoCLOPramide (REGLAN) tablet 10 mg (not administered)  ketorolac (TORADOL) 15 MG/ML injection 15 mg (15 mg Intravenous Given 01/05/16 2302)  sodium chloride 0.9 % bolus 1,000 mL (0 mLs Intravenous Stopped 01/06/16 0026)  morphine 4 MG/ML injection 4 mg (4 mg Intravenous Given 01/05/16 2304)     Initial Impression / Assessment and Plan /  ED Course  I have reviewed the triage vital signs and the nursing notes.  Pertinent labs & imaging results that were available during my care of the patient were reviewed by me and considered in my medical decision making (see chart for details).  Clinical Course    65 year old female no prior history of nephrolithiasis but significant family history. She presents with stabbing left flank pain and decreased urine output nausea vomiting.  Ordered renal studies CT and labs Nausea was well controlled with Zofran in the ED initially, but she then continued to experience nausea. Given reglan Her pain was well managed while in the Ed.  Given 1L bolus  U/A showed no hematuria. Ct showed no evidence of nephrolithiasis, hydronephrosis or urethral stone. 9 mm probable cyst in the right hepatic lobe. Ground glass opacities noted in the left lower lung concerning for pneumonia. This can explain the localized pain she is feeling on the left.   Will discharge home with azithromycin for community acquired pneumonia and close f/u with PCP. Patient was advised to follow up with PCP regarding incidental liver cyst and given lab results from this visit to take to her provider.  Discussed strict return precautions. Patient was advised to return to the emergency department if experiencing any worsening of symptoms. Patient understood instructions and agreed with discharge plan. Patient was non-toxic appearing and ready to go home.  Patient was discussed with Dr. [name] Heck who also has seen patient and agrees with assessment and  plan.  Final Clinical Impressions(s) / ED Diagnoses   Final diagnoses:  Community acquired pneumonia of left lower lobe of lung ([place])  Liver cyst    New Prescriptions New Prescriptions   AZITHROMYCIN (ZITHROMAX Z-PAK) 250 MG TABLET    Take two tablets on day one and one tablet on day 2-5   METOCLOPRAMIDE (REGLAN) 10 MG TABLET    Take 1 tablet (10 mg total) by mouth every 8 (eight) hours as needed for nausea or vomiting.     Emeline General, PA-C 01/06/16 [place], MD 01/06/16 [phone]

## 2016-01-06 NOTE — Telephone Encounter (Signed)
Will follow-up with patient regarding resolution of pneumonia and scheduling of GYN surgery fir date in January.  Encounter closed.

## 2016-01-06 NOTE — Telephone Encounter (Signed)
Her CT of the abdomen and pelvis showed the pneumonia, the liver cyst, some atherosclerosis, and no enlarged lymph nodes in the pelvic area. We will need to proceed forward with hysteroscopy and dilation and curettage in January after she is fully recovered from the pneumonia.

## 2016-01-07 ENCOUNTER — Telehealth: Payer: Self-pay | Admitting: *Deleted

## 2016-01-07 NOTE — Telephone Encounter (Signed)
Follow-up call to patient following ED visit for pneumonia. Patient states she is not quite as sore but did not get started on medication till last night (see previous phone encounter) so not feeling a lot better yet.  States she is feeling some "cramps" today. Advised calling to encourage her to schedule follow-up appointment with PCP as instructed by ED physician. Advised of result note sent thru My Chart from Dr Quincy Simmonds and that will need surgical clearance on respiratory status to proceed with surgical evaluation of PMB.  Advised with CT negative for pelvic adenopathy, Dr Quincy Simmonds recommends proceeding with hysteroscopy/D&C as soon as respiratory status is cleared for for procedure.  Patient agreeable to contact PCP to schedule follow up and contact me back after appointment.   Patient stated she had not reviewed My Chart message from Dr Quincy Simmonds so this was reviewed with her. Patient states she is seeing Dr Ronnald Ramp for spinal injections. Advised she contact their office and let them know of recent CT so they can review results for any needed comparison.  Routing to provider for final review. Patient agreeable to disposition. Will close encounter.

## 2016-01-07 NOTE — Telephone Encounter (Signed)
-----   Message from Nunzio Cobbs, MD sent at 01/06/2016  6:36 PM EST ----- Hello Ms. Trumbull,   This is Dr. Quincy Simmonds releasing your CT scan results to you.  As you know, this showed signs of pneumonia, a small cyst in your liver, apparently a tiny left kidney cyst, some atherosclerosis, and no enlarged lymph nodes in the abdomen or pelvic area.  The radiologist is commending on some lumbar spine degenerative change and narrowing of the spinal canal at the level of your lower lumbar spine.  If you are having pain in your lower spine or legs, you will need to see a spine or orthopedic specialist.  We will plan for your surgery after you have recovered from your pneumonia and have had follow up with your primary care provider to give clearance for your surgery.   I hope you feel well soon!  Josefa Half, MD  Cc- Marisa Sprinkles

## 2016-01-13 ENCOUNTER — Other Ambulatory Visit: Payer: BLUE CROSS/BLUE SHIELD

## 2016-01-15 ENCOUNTER — Telehealth: Payer: Self-pay | Admitting: Obstetrics and Gynecology

## 2016-01-15 DIAGNOSIS — N950 Postmenopausal bleeding: Secondary | ICD-10-CM

## 2016-01-15 NOTE — Telephone Encounter (Signed)
Patient has some questions about how to obtain medical clearance for surgery.

## 2016-01-15 NOTE — Telephone Encounter (Signed)
Spoke with patient. Patient is asking what she needs to do to be cleared for surgery with Dr.Silva. Advised patient needs to be seen for follow up assessment with her PCP to ensure pneumonia has cleared and she is okay for surgery. Patient reports that she saw Dr.Becker this week and was told a form needed to be faxed to [facility]. Advised I will contact [facility] to request OV note from her visit for Dr.Silva's review and advise. Patient is agreeable.  Call to [place] at [facility]. Spoke with medical records. OV note will be faxed to the office for Dr.Silva's review.

## 2016-01-19 ENCOUNTER — Telehealth: Payer: Self-pay | Admitting: Obstetrics and Gynecology

## 2016-01-19 NOTE — Telephone Encounter (Signed)
Spoke with patient regarding 2018 surgery benefits for a recommended procedure. Patient understood and is agreeable. Patient states she will move forward with surgery, once she has been cleared from her PCP provider for surgery.  See phone encounter dated 01/15/16. Vicki [name] is coordinating obtaining medical records from PCP provider for Dr Quincy Simmonds to review.   Routing to [employer]

## 2016-01-21 NOTE — Telephone Encounter (Signed)
Dr.Silva, have you received OV notes from [facility] ?

## 2016-01-21 NOTE — Telephone Encounter (Signed)
I have not received results from [facility] about her clearance for surgery from the standpoint of her pneumonia.  Please contact them to get this.  We need to proceed forward with her hysteroscopic polypectomy with dilation and curettage.  Cc- Lamont Snowball

## 2016-01-22 NOTE — Telephone Encounter (Signed)
Call to [facility]. Spoke to [place]. She will refax office note from Dr Deneise Lever.

## 2016-01-25 NOTE — Telephone Encounter (Signed)
Call to patient. Advised Dr Quincy Simmonds has received office noted from PCP and recommends proceeding with surgery as soon as able. Patient states she has been feeling better but continues to have issues with sinus drainage and is taking Mucinex.States sinus drainage is green in color and advised she needs to follow back up with PCP regarding this. Discussed first available date of 02-01-16 and patient is agreeable.Surgery instruction sheet reviewed and printed copy will be mailed with brochure to surgery center.  Case request for 02-01-16 at 0730 at [facility].

## 2016-01-26 ENCOUNTER — Telehealth: Payer: Self-pay | Admitting: *Deleted

## 2016-01-26 NOTE — Telephone Encounter (Signed)
Call to patient. Advised reviewed with Dr Quincy Simmonds and surgery rescheduled to 02-09-16 at [facility]. Patient to keep surgery consult scheduled for tomorrow here in office with Dr Quincy Simmonds. Patient requested to delay surgery into February with new insurance policy.  Advised that first available February date is 03-01-16. Dr Quincy Simmonds would prefer she not delay evaluation of PMB into February.  Dr Quincy Simmonds will review call and can discuss further at appointment tomorrow.  Surgery rescheduled to 02-09-16 at 1100 at [place] to provider for final review. Patient agreeable to disposition. Will close encounter.

## 2016-01-26 NOTE — Telephone Encounter (Signed)
Patient called and said, "I'd like to get a message to [place]. 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 I saw Dr. Cleon Gustin at [facility]. He told me it may have been a blood clot passing through my eye. Gay Filler told me she wanted to know everything so that's why I am calling with this information."

## 2016-01-27 ENCOUNTER — Other Ambulatory Visit: Payer: Self-pay | Admitting: Obstetrics and Gynecology

## 2016-01-27 ENCOUNTER — Encounter: Payer: Self-pay | Admitting: Obstetrics and Gynecology

## 2016-01-27 ENCOUNTER — Ambulatory Visit (INDEPENDENT_AMBULATORY_CARE_PROVIDER_SITE_OTHER): Payer: BLUE CROSS/BLUE SHIELD | Admitting: Obstetrics and Gynecology

## 2016-01-27 VITALS — BP 142/84 | HR 84 | Ht 62.0 in | Wt 179.6 lb

## 2016-01-27 DIAGNOSIS — Z113 Encounter for screening for infections with a predominantly sexual mode of transmission: Secondary | ICD-10-CM | POA: Diagnosis not present

## 2016-01-27 DIAGNOSIS — N766 Ulceration of vulva: Secondary | ICD-10-CM

## 2016-01-27 DIAGNOSIS — N950 Postmenopausal bleeding: Secondary | ICD-10-CM | POA: Diagnosis not present

## 2016-01-27 LAB — HEPATITIS C ANTIBODY: HCV Ab: NEGATIVE

## 2016-01-27 NOTE — Progress Notes (Signed)
GYNECOLOGY  VISIT   HPI: 66 y.o.   Married  Caucasian  female   [phone] with Patient's last menstrual period was 07/18/2011.   here for surgical consult and possible endometrial biopsy for postmenopausal bleeding. Reporting vaginal odor.   Patient seen 12/30/15 for postmenopausal bleeding which occurred in December 2017.  She had an attempted sonohysterogram and EMB done the following day and was diagnosed with a cystic endometrial mass with a feeder vessel.   Cannula would not pass for the sonohysterogram.  EMB done to 7 am and specimen was nondiagnostic.   Patient shortly thereafter was diagnosed with community pneumonia and has hospitalized.  Received azithromycin.  Has now been cleared by her medical physician to proceed with surgery.  She had a CT scan done to evaluate for pelvic/vulvar lymphadenopathy and she was diagnosed with community acquired pneumonia, atherosclerosus, and no lymphadenopathy.   Medicare benefits start Feb. 1.  Grand daughter marries in March. Patient is living with ex-husband.  They are sexually active.  GYNECOLOGIC HISTORY: Patient's last menstrual period was 07/18/2011. Contraception:  Postmenopausal Menopausal hormone therapy:  none Last mammogram:  11/05/15 BIRADS1, Density B  Last pap smear:  12-30-15 Neg:Neg HR HPV         OB History    Gravida Para Term Preterm AB Living   3 3 3     3    SAB TAB Ectopic Multiple Live Births           3         Patient Active Problem List   Diagnosis Date Noted  . Hypertriglyceridemia 10/07/2015  . Abnormal nuclear stress test   . Chest pain 07/23/2015  . Palpitations 07/23/2015  . Depression   . Left-sided weakness 05/24/2014  . Hyponatremia 05/24/2014  . Right arm numbness 05/24/2014  . Alcohol abuse 05/24/2014  . Alcohol abuse with intoxication ([place]) 05/24/2014  . Syncope and collapse 05/24/2014  . CVA (cerebral infarction)   . Essential hypertension   . Hyperlipidemia 04/04/2013    Past  Medical History:  Diagnosis Date  . Alcohol intoxication ([place]) 05/2014    ED note   . Anxiety   . Community acquired pneumonia 01/05/2016  . Hepatic cyst 01/06/2016  . Hyperlipidemia   . Hypertension   . Migraines    with aura  . 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 Osteopenia   . Postmenopausal bleeding 12/2015  . Squamous cell carcinoma 12/25/2015   right leg and nose    Past Surgical History:  Procedure Laterality Date  . BREAST ENHANCEMENT SURGERY    . CARDIAC CATHETERIZATION N/A 08/12/2015   Procedure: Left Heart Cath and Coronary Angiography;  Surgeon: Jettie Booze, MD;  Location: [place] [facility];  Service: Cardiovascular;  Laterality: N/A;  . FACIAL COSMETIC SURGERY    . HAND SURGERY     nerve & tendon repair  . LUMBAR EPIDURAL INJECTION  06/16/2015    Current Outpatient Prescriptions  Medication Sig Dispense Refill  . aspirin 325 MG tablet Take 325 mg by mouth at bedtime.     . calcium-vitamin D (OSCAL WITH D) 500-200 MG-UNIT tablet Take 1 tablet by mouth daily with breakfast.     . cholecalciferol (VITAMIN D) 1000 units tablet Take 4,000 Units by mouth 2 (two) times daily.    . cyanocobalamin (,VITAMIN B-12,) 1000 MCG/ML injection Inject 1,000 mcg into the muscle every 30 (thirty) days. Last injection 05/23/2014    . dimenhyDRINATE (  DRAMAMINE) 50 MG tablet Take 50 mg by mouth every 8 (eight) hours as needed for nausea.    Marland Kitchen doxepin (SINEQUAN) 150 MG capsule Take 150 mg by mouth at bedtime.    Marland Kitchen MAGNESIUM PO Take 400 mg by mouth daily.     . Multiple Vitamins-Calcium (DAILY VITAMINS FOR WOMEN PO) Take 1 tablet by mouth daily.     . naproxen sodium (ANAPROX) 220 MG tablet Take 220-440 mg by mouth See admin instructions. Takes 1 tab twice daily, can take addt'l tab as needed for pain    . Omega-3 Fatty Acids (FISH OIL) 1000 MG CAPS Take 1 capsule (1,000 mg total) by mouth 2 (two) times daily. 60 capsule 0  . ranitidine (ZANTAC) 75 MG tablet Take  75 mg by mouth daily as needed for heartburn.    . sertraline (ZOLOFT) 100 MG tablet Take 150 mg by mouth daily. Take 1 and 1/2 tab by mouth daily to total 150mg     . valsartan (DIOVAN) 320 MG tablet Take 320 mg by mouth at bedtime.     No current facility-administered medications for this visit.      ALLERGIES: Codeine; Crestor [rosuvastatin]; Other; and Simvastatin  Family History  Problem Relation Age of Onset  . Stroke Father   . Depression Father   . Stroke Mother   . Hypertension Brother   . Heart disease Brother     stint  . Heart attack Brother   . Heart attack Maternal Uncle   . Heart attack Maternal Grandmother   . Heart attack Brother   . Heart attack Maternal Uncle   . Heart attack Maternal Uncle   . Heart attack Maternal Uncle   . Heart attack Maternal Uncle     Social History   Social History  . Marital status: Married    Spouse name: N/A  . Number of children: N/A  . Years of education: N/A   Occupational History  . Not on file.   Social History Main Topics  . Smoking status: Former Research scientist (life sciences)  . Smokeless tobacco: Never Used  . Alcohol use 2.4 oz/week    4 Glasses of wine per week  . Drug use: No  . Sexual activity: Yes    Partners: Male    Birth control/ protection: Post-menopausal   Other Topics Concern  . Not on file   Social History Narrative  . No narrative on file    ROS:  Pertinent items are noted in HPI.  PHYSICAL EXAMINATION:    BP (!) 142/84 (BP Location: Right Arm, Patient Position: Sitting, Cuff Size: Normal)   Pulse 84   Ht 5\' 2"  (1.575 m)   Wt 179 lb 9.6 oz (81.5 kg)   LMP 07/18/2011   BMI 32.85 kg/m     General appearance: alert, cooperative and appears stated age   Pelvic: External genitalia:  Indurated areas of the bilateral vulva.               Right lateral labia majora with 1 cm painless ulceration that is draining.  Area underneath feels like a lymph node.              Urethra:  normal appearing urethra with no  masses, tenderness or lesions              Bartholins and Skenes: normal                 Vagina: normal appearing vagina with normal color and discharge, no  lesions              Cervix: no lesions                Bimanual Exam:  Uterus:  normal size, contour, position, consistency, mobility, non-tender              Adnexa: no mass, fullness, tenderness              Procedure - EMB. Consent for procedure.  Speculum placed in vagina.  Hibiclens used to sterilize cervix. Tenaculum to anterior cervical lip. Paracervical block - 10 cc 1% lidocaine [id number], expiration 02/21. Os finder used and then small hegar dilator.  Uterus sounded to 8 cm. Rocket EMB passed three times.  Tissue to [facility]. STAT. Instruments removed.  Minimal EBL.  No complications.  Chaperone was present for exam.  ASSESSMENT  Postmenopausal bleeding.  Cystic endometrium on ultrasound. Previous nondx EMB. Painless vulvar ulcer.  Lymph node draining? Vaginal odor.  Recent community acquired pneumonia resolved.  PLAN  Follow up EMB to [facility].  STD testing and Affirm. Wound culture.  Final surgical plan to follow depending on the results of the EMB. If malignancy, to GYN ONC.   An After Visit Summary was printed and given to the patient.  ____25__ minutes face to face time of which over 50% was spent in counseling.

## 2016-01-27 NOTE — Telephone Encounter (Signed)
See phone encounters from 01-26-16 for additional documentation. Reviewed with Dr Quincy Simmonds. Patient coming today for surgical consult. Dr Quincy Simmonds will plan endometrial biopsy today and pending results, discuss potential to delay surgery into February as patient requested (see previous phone encounter.)  Call to patient and advised of plan for endometrial biopsy. Instructed to take Motrin 800 mg one hour prior to appointment with food.   Dr Quincy Simmonds will review all changes to medical history at that time, recent eye issue and pneumonia.  Routing to provider for final review. Patient agreeable to disposition. Will close encounter.

## 2016-01-27 NOTE — Patient Instructions (Signed)

## 2016-01-28 LAB — GC/CHLAMYDIA PROBE AMP
CT PROBE, AMP APTIMA: NOT DETECTED
GC PROBE AMP APTIMA: NOT DETECTED

## 2016-01-28 LAB — STD PANEL
HIV 1&2 Ab, 4th Generation: NONREACTIVE
Hepatitis B Surface Ag: NEGATIVE

## 2016-01-28 LAB — WET PREP BY MOLECULAR PROBE
Candida species: NEGATIVE
Gardnerella vaginalis: POSITIVE — AB
Trichomonas vaginosis: NEGATIVE

## 2016-01-29 ENCOUNTER — Telehealth: Payer: Self-pay | Admitting: *Deleted

## 2016-01-29 MED ORDER — SULFAMETHOXAZOLE-TRIMETHOPRIM 800-160 MG PO TABS: 1.0000 | ORAL_TABLET | Freq: Two times a day (BID) | ORAL | 0 refills | Status: DC

## 2016-01-29 MED ORDER — METRONIDAZOLE 500 MG PO TABS: 500.0000 mg | ORAL_TABLET | Freq: Two times a day (BID) | ORAL | 0 refills | Status: DC

## 2016-01-29 NOTE — Telephone Encounter (Signed)
-----   Message from Nunzio Cobbs, MD sent at 01/29/2016  6:15 AM EST ----- Please contact patient with pathology results from her repeat EMB.  The first EMB was nondiagnostic.  This EMB documents atrophic endometrium, which means that it also did not capture the endometrial pathology.  She has signs of clear cystic change and thickening on her ultrasound.  I do recommend that she proceed forward with hysteroscopy and dilation and curettage surgery on 02/09/16.  I also did full STD testing the day of the biopsy, and this was all neg for HIV, syphilis, hep B and C, and gonorrhea and chlamydia.  I did a wound culture which did show Gram positive cocci and white blood cells.  Her Affirm showed bacterial vaginosis.   I am recommending two abx treatments: Bactrim DS po bid for one week to treat her skin. Flagyl 500 mg po bid for one week to treat the BV.  Please give ETOH precautions.  Avoid intercourse during treatment.  I do need to see her back for a brief preop visit as her visit the other day was really a problem visit. She will need anesthesia consultation due to her medical history.  Cc- Marisa Sprinkles

## 2016-01-29 NOTE — Telephone Encounter (Signed)
Patient returned call. Results of endometrial biopsy, STD testing and wound culture and Affirm culture reviewed with patien. Instructions regarding antibiotics, no alcohol while on Flagyl and for 3 days following and no intercourse while on medications reviewed with patient as directed by Dr Quincy Simmonds. Discussed Dr Elza Rafter recommendation to proceed with hysteroscopy/D&C as scheduled. Patient states she is becoming sick again. She has "partial laryngitis" and after speaking with [employer] rep, she wants to wait until medicare is effective on 02-18-16 to proceed with surgery.   She is aware Dr Elza Rafter recommends proceeding as scheduled and that we are trying to rule out abnormal cells of the uterus including precancer orr cancerous cells.  She feels that waiting two more weeks will not make a difference.   Advised will send medications to pharmacy per Dr Quincy Simmonds and will follow-up with her next week with next steps.

## 2016-01-29 NOTE — Telephone Encounter (Signed)
Call to patient regarding test results. Left message to call back.

## 2016-01-31 LAB — WOUND CULTURE: Gram Stain: NONE SEEN

## 2016-02-01 ENCOUNTER — Other Ambulatory Visit: Payer: Self-pay | Admitting: *Deleted

## 2016-02-01 ENCOUNTER — Telehealth: Payer: Self-pay | Admitting: *Deleted

## 2016-02-01 MED ORDER — AMPICILLIN 500 MG PO CAPS: 500.0000 mg | ORAL_CAPSULE | Freq: Four times a day (QID) | ORAL | 0 refills | Status: AC

## 2016-02-01 NOTE — Telephone Encounter (Signed)
I would consider doing the surgery on 02/22/16 in the am.  I have concerns about waiting longer than this.  I have sent a separate message through triage that patient needs tx for GBS skin infection.  I have recommended ampicillin.

## 2016-02-01 NOTE — Telephone Encounter (Signed)
Spoke with patient, advised of results and recommendations as seen below. Patient verbalizes understanding and is agreeable. Order placed for Ampicillin to verified pharmacy on file. Patient asking if a surgery date has been set yet? Advised patient will review with  Kandace Blitz for scheduling and return call. Patient is agreeable.  Routing to provider for final review. Patient is agreeable to disposition. Will close encounter.  Cc: Lamont Snowball

## 2016-02-01 NOTE — Telephone Encounter (Signed)
-----   Message from Nunzio Cobbs, MD sent at 01/31/2016  6:04 PM EST ----- Please report to the patient that her skin wound culture of the vulva showed group B strep. I recently started her on Bactrim which will not treat the strep, so she can stop this. I am recommending Ampicillin 500 mg po q 6 hours x 7 days.    Cc- Marisa Sprinkles

## 2016-02-02 NOTE — Telephone Encounter (Signed)
Per review with Dr Quincy Simmonds, surgery rescheduled to 02-22-16 at 27 at [facility] Call to patient and notified that Dr Quincy Simmonds continues to recommend proceeding with 02-09-16 date but if patient insists on moving to February, at least recommend proceed as soon as possible in February. Patient is agreeable to this and advised surgery is available on 02-22-16 at 1030. Surgery appointments rescheduled according to this date.  Patient states she is currently still fighting sore throat and is audibly hoarse. Asking how long it will be before antibiotics we have given will work. Advised these medications are being given for GYN issues (BV and vulvar skin ulceration)  and may not have any effect on her respiratory or sore throat issues. Advised if this is viral, no antibiotics will help but that our antibiotic may be incorrect med or dose to be effective in treating anything except her GYN issues. If she continues to be ill with sore throat or respiratory issues, she needs to see PCP for this, especially so she will be well in time for surgery on 02-22-16. Patient agreeable.  Routing to provider for final review. Patient agreeable to disposition. Will close encounter.

## 2016-02-11 ENCOUNTER — Telehealth: Payer: Self-pay | Admitting: Obstetrics and Gynecology

## 2016-02-11 ENCOUNTER — Ambulatory Visit (INDEPENDENT_AMBULATORY_CARE_PROVIDER_SITE_OTHER): Payer: BLUE CROSS/BLUE SHIELD | Admitting: Obstetrics and Gynecology

## 2016-02-11 ENCOUNTER — Encounter: Payer: Self-pay | Admitting: Obstetrics and Gynecology

## 2016-02-11 VITALS — BP 130/78 | HR 90 | Ht 62.0 in | Wt 180.8 lb

## 2016-02-11 DIAGNOSIS — N9489 Other specified conditions associated with female genital organs and menstrual cycle: Secondary | ICD-10-CM

## 2016-02-11 DIAGNOSIS — N763 Subacute and chronic vulvitis: Secondary | ICD-10-CM

## 2016-02-11 DIAGNOSIS — N950 Postmenopausal bleeding: Secondary | ICD-10-CM

## 2016-02-11 MED ORDER — NYSTATIN-TRIAMCINOLONE 100000-0.1 UNIT/GM-% EX CREA: 1.0000 "application " | TOPICAL_CREAM | Freq: Two times a day (BID) | CUTANEOUS | 0 refills | Status: DC

## 2016-02-11 MED ORDER — TRIAMCINOLONE ACETONIDE 0.1 % EX CREA: 1.0000 "application " | TOPICAL_CREAM | Freq: Two times a day (BID) | CUTANEOUS | 0 refills | Status: DC

## 2016-02-11 MED ORDER — NYSTATIN 100000 UNIT/GM EX OINT: 1.0000 "application " | TOPICAL_OINTMENT | Freq: Two times a day (BID) | CUTANEOUS | 0 refills | Status: DC

## 2016-02-11 MED ORDER — NYSTATIN 100000 UNIT/GM EX CREA: 1.0000 "application " | TOPICAL_CREAM | Freq: Two times a day (BID) | CUTANEOUS | 0 refills | Status: DC

## 2016-02-11 MED ORDER — FLUCONAZOLE 150 MG PO TABS: 150.0000 mg | ORAL_TABLET | Freq: Once | ORAL | 0 refills | Status: AC

## 2016-02-11 NOTE — Progress Notes (Signed)
GYNECOLOGY  VISIT   HPI: 66 y.o.   Married  Caucasian  female   [phone] with Patient's last menstrual period was 07/18/2011.   here for surgery consult.    Patient seen 12/30/15 for postmenopausal bleeding which occurred in December 2017.  She had an attempted sonohysterogram and EMB done the following day and was diagnosed with a cystic endometrial mass with a feeder vessel.   Cannula would not pass for the sonohysterogram.  EMB done to 7 am and specimen was nondiagnostic.  Repeat EMB performed on 01/27/16 and only atrophy noted.  Had PMB in January on 14, 15, and 16.   Patient shortly after her December appointment was diagnosed with community pneumonia and has hospitalized.  Received azithromycin.  Has now been cleared by her medical physician to proceed with surgery.  She had a CT scan done to evaluate for pelvic/vulvar lymphadenopathy and she was diagnosed with community acquired pneumonia, atherosclerosus, and no lymphadenopathy.   I did a wound culture of a weeping area of the right groin, and this was positive for Group B strep which I treated with Ampicillin.   BV was diagnosed by Affirm on 01/27/16 and treated with Flagyl.   Medicare benefits start Feb. 1.  Grand daughter marries in March.  Patient is living with ex-husband.  They are sexually active. Patient had negative STD testing on 01/27/16.  She also had a wound culture showing Group B strep and was treated with Ampicillin.   PCP - Dr. Jeremy Johann  GYNECOLOGIC HISTORY: Patient's last menstrual period was 07/18/2011. Contraception:  Postmenopausal Menopausal hormone therapy:  none Last mammogram:  11/05/15 BIRADS1, Density B  Last pap smear: 12-30-15 Neg:Neg HR HPV           OB History    Gravida Para Term Preterm AB Living   3 3 3     3    SAB TAB Ectopic Multiple Live Births           3         Patient Active Problem List   Diagnosis Date Noted  . Hypertriglyceridemia 10/07/2015  . Abnormal  nuclear stress test   . Chest pain 07/23/2015  . Palpitations 07/23/2015  . Depression   . Left-sided weakness 05/24/2014  . Hyponatremia 05/24/2014  . Right arm numbness 05/24/2014  . Alcohol abuse 05/24/2014  . Alcohol abuse with intoxication ([place]) 05/24/2014  . Syncope and collapse 05/24/2014  . CVA (cerebral infarction)   . Essential hypertension   . Hyperlipidemia 04/04/2013    Past Medical History:  Diagnosis Date  . Alcohol intoxication ([place]) 05/2014    ED note   . Anxiety   . Community acquired pneumonia 01/05/2016  . Hepatic cyst 01/06/2016  . Hyperlipidemia   . Hypertension   . Migraines    with aura  . 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 Osteopenia   . Postmenopausal bleeding 12/2015  . Squamous cell carcinoma 12/25/2015   right leg and nose    Past Surgical History:  Procedure Laterality Date  . BREAST ENHANCEMENT SURGERY    . CARDIAC CATHETERIZATION N/A 08/12/2015   Procedure: Left Heart Cath and Coronary Angiography;  Surgeon: Jettie Booze, MD;  Location: [place] [facility];  Service: Cardiovascular;  Laterality: N/A;  . FACIAL COSMETIC SURGERY    . HAND SURGERY     nerve & tendon repair  . LUMBAR EPIDURAL INJECTION  06/16/2015    Current Outpatient Prescriptions  Medication Sig Dispense Refill  . aspirin 325 MG tablet Take 325 mg by mouth at bedtime.     . cholecalciferol (VITAMIN D) 1000 units tablet Take 5,000 Units by mouth daily.     Marland Kitchen MAGNESIUM PO Take 1 tablet by mouth daily.     . Omega-3 Fatty Acids (FISH OIL) 1000 MG CAPS Take 1 capsule (1,000 mg total) by mouth 2 (two) times daily. 60 capsule 0  . QUEtiapine (SEROQUEL) 100 MG tablet Take 200-300 mg by mouth at bedtime.    . ranitidine (ZANTAC) 75 MG tablet Take 75 mg by mouth daily as needed for heartburn.    . sertraline (ZOLOFT) 100 MG tablet Take 100 mg by mouth at bedtime.     . valsartan (DIOVAN) 320 MG tablet Take 320 mg by mouth at bedtime.     No current  facility-administered medications for this visit.      ALLERGIES: Codeine; Crestor [rosuvastatin]; Other; and Simvastatin  Family History  Problem Relation Age of Onset  . Stroke Father   . Depression Father   . Stroke Mother   . Hypertension Brother   . Heart disease Brother     stint  . Heart attack Brother   . Heart attack Maternal Uncle   . Heart attack Maternal Grandmother   . Heart attack Brother   . Heart attack Maternal Uncle   . Heart attack Maternal Uncle   . Heart attack Maternal Uncle   . Heart attack Maternal Uncle     Social History   Social History  . Marital status: Married    Spouse name: N/A  . Number of children: N/A  . Years of education: N/A   Occupational History  . Not on file.   Social History Main Topics  . Smoking status: Former Research scientist (life sciences)  . Smokeless tobacco: Never Used  . Alcohol use 2.4 oz/week    4 Glasses of wine per week  . Drug use: No  . Sexual activity: Yes    Partners: Male    Birth control/ protection: Post-menopausal   Other Topics Concern  . Not on file   Social History Narrative  . No narrative on file    ROS:  Pertinent items are noted in HPI.  PHYSICAL EXAMINATION:    BP 130/78 (BP Location: Right Arm, Patient Position: Sitting, Cuff Size: Normal)   Pulse 90   Ht 5\' 2"  (1.575 m)   Wt 180 lb 12.8 oz (82 kg)   LMP 07/18/2011   BMI 33.07 kg/m     General appearance: alert, cooperative and appears stated age Head: Normocephalic, without obvious abnormality, atraumatic Neck: no adenopathy, supple, symmetrical, trachea midline and thyroid normal to inspection and palpation Lungs: clear to auscultation bilaterally Heart: regular rate and rhythm Abdomen: soft, non-tender, no masses,  no organomegaly Extremities: extremities normal, atraumatic, no cyanosis or edema Skin: Skin color, texture, turgor normal. No rashes or lesions Lymph nodes: Cervical, supraclavicular, and axillary nodes normal. No abnormal inguinal  nodes palpated Neurologic: Grossly normal  Pelvic: External genitalia: right intertriginous area with erythema consistent with yeast.  Right inguinal fold with ulcerative area that is smaller than at the last visit.               Urethra:  normal appearing urethra with no masses, tenderness or lesions              Bartholins and Skenes: normal  Vagina: normal appearing vagina with normal color and discharge, no lesions              Cervix: no lesions                Bimanual Exam:  Uterus:  normal size, contour, position, consistency, mobility, non-tender              Adnexa: no mass, fullness, tenderness         Chaperone was present for exam.  ASSESSMENT  Postmenopausal bleeding.  Endometrial mass with previous nondiagnostic EMB x 2.  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 Candida of intertriginous area.  Right inguinal/vulvar ulcer - resolving with Ampicillin abx. 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  PLAN  Will treat with a course of Diflucan and Mycolog II cream.  Will proceed with hysteroscopy with Myosure resection of endometrial mass, dilation and curettage. Risks, benefits, and alternatives reviewed with the patient who wishes to proceed.  Surgical expectations and recovery reviewed.  If ulcerative area is still present at the time of surgery, will recommend a biopsy of this area.  An After Visit Summary was printed and given to the patient.  _25____ minutes face to face time of which over 50% was spent in counseling.

## 2016-02-11 NOTE — Telephone Encounter (Signed)
Spoke with patient. Patient states Mycolog II cream cost $205, would like less expensive option. Advised patient will place new orders for Mycostatin and Kenalog cream. Advised patient will be 2 creams instead of one. Apply equal parts to affected area bid for up to seven days. Patient verbalizes understanding and is agreeable.  Dr. Quincy Simmonds, any additional recommendations?

## 2016-02-11 NOTE — Telephone Encounter (Signed)
Patient calling to speak with nurse about switching a prescription that was called in and is too expensive.

## 2016-02-11 NOTE — Telephone Encounter (Signed)
Spoke with Margreta Journey at [facility]. Advised Mycolog II order discontinued. Advised New orders placed. Discontinue mycostatin ointment -entered in error, new order placed for mycostatin cream.

## 2016-02-11 NOTE — Telephone Encounter (Signed)
Thank you for facilitating this change.  You may close the encounter.

## 2016-02-15 ENCOUNTER — Ambulatory Visit: Payer: BLUE CROSS/BLUE SHIELD | Admitting: Obstetrics and Gynecology

## 2016-02-16 ENCOUNTER — Telehealth: Payer: Self-pay | Admitting: *Deleted

## 2016-02-16 NOTE — Telephone Encounter (Signed)
Call to patient. Notified surgery start time has been adjusted to 1130 on 02-22-16. Plan arrival at 1000 and hospital will confirm this when she goes to pre-op appointment on 02-19-16. Patient agreeable. Routing to provider for final review. Patient agreeable to disposition. Will close encounter.

## 2016-02-19 ENCOUNTER — Encounter (HOSPITAL_COMMUNITY): Payer: Self-pay

## 2016-02-19 ENCOUNTER — Encounter (HOSPITAL_COMMUNITY)
Admission: RE | Admit: 2016-02-19 | Discharge: 2016-02-19 | Disposition: A | Discharge status: Home / Self Care | Payer: PPO | Source: Ambulatory Visit | Attending: Obstetrics and Gynecology | Admitting: Obstetrics and Gynecology

## 2016-02-19 DIAGNOSIS — Z01812 Encounter for preprocedural laboratory examination: Secondary | ICD-10-CM | POA: Insufficient documentation

## 2016-02-19 HISTORY — DX: Fibromyalgia: M79.7

## 2016-02-19 HISTORY — DX: Unspecified convulsions: R56.9

## 2016-02-19 HISTORY — DX: Furuncle, unspecified: L02.92

## 2016-02-19 HISTORY — DX: Post-traumatic stress disorder, unspecified: F43.10

## 2016-02-19 HISTORY — DX: Personal history of systemic steroid therapy: Z92.241

## 2016-02-19 HISTORY — DX: Insomnia, unspecified: G47.00

## 2016-02-19 HISTORY — DX: Nausea with vomiting, unspecified: R11.2

## 2016-02-19 HISTORY — DX: Cerebral infarction, unspecified: I63.9

## 2016-02-19 HISTORY — DX: Depression, unspecified: F32.A

## 2016-02-19 HISTORY — DX: Other chronic pain: G89.29

## 2016-02-19 HISTORY — DX: Gastro-esophageal reflux disease without esophagitis: K21.9

## 2016-02-19 HISTORY — DX: Dorsalgia, unspecified: M54.9

## 2016-02-19 HISTORY — DX: Cramp and spasm: R25.2

## 2016-02-19 HISTORY — DX: Unspecified osteoarthritis, unspecified site: M19.90

## 2016-02-19 HISTORY — DX: Other specified postprocedural states: Z98.890

## 2016-02-19 HISTORY — DX: Major depressive disorder, single episode, unspecified: F32.9

## 2016-02-19 HISTORY — DX: Streptococcal pharyngitis: J02.0

## 2016-02-19 LAB — CBC
HEMATOCRIT: 43.7 % (ref 36.0–46.0)
HEMOGLOBIN: 15.2 g/dL — AB (ref 12.0–15.0)
MCH: 31.9 pg (ref 26.0–34.0)
MCHC: 34.8 g/dL (ref 30.0–36.0)
MCV: 91.6 fL (ref 78.0–100.0)
Platelets: 264 10*3/uL (ref 150–400)
RBC: 4.77 MIL/uL (ref 3.87–5.11)
RDW: 12.9 % (ref 11.5–15.5)
WBC: 8.7 10*3/uL (ref 4.0–10.5)

## 2016-02-19 LAB — BASIC METABOLIC PANEL
ANION GAP: 9 (ref 5–15)
BUN: 20 mg/dL (ref 6–20)
CO2: 25 mmol/L (ref 22–32)
Calcium: 9.6 mg/dL (ref 8.9–10.3)
Chloride: 99 mmol/L — ABNORMAL LOW (ref 101–111)
Creatinine, Ser: 0.78 mg/dL (ref 0.44–1.00)
GFR calc Af Amer: >60 mL/min (ref >60)
Glucose, Bld: 103 mg/dL — ABNORMAL HIGH (ref 65–99)
POTASSIUM: 4.9 mmol/L (ref 3.5–5.1)
Sodium: 133 mmol/L — ABNORMAL LOW (ref 135–145)

## 2016-02-19 NOTE — Pre-Procedure Instructions (Signed)
Dr. Gifford Shave reviewed EKG from 07/19/2015 was ok with this EKG did not request a repeat EKG prior to surgery 02/22/16.  Medical clearance note from [place] printed from [facility] and placed on front of Vicki [name]'s chart.

## 2016-02-19 NOTE — Patient Instructions (Addendum)
Your procedure is scheduled on:  Monday, Feb. 5, 2018  Enter through the [employer] of [facility] at: 9:00 AM  Pick up the phone at the desk and dial [phone].  Call this number if you have problems the morning of surgery: [phone].  Remember: Do NOT eat food or drink after:  Midnight Sunday  Take these medicines the morning of surgery with a SIP OF WATER:  Ranitidine  Stop ALL herbal medications at this time  Do NOT smoke the day of surgery.  Do NOT wear jewelry (body piercing), metal hair clips/bobby pins, make-up, or nail polish. Do NOT wear lotions, powders, or perfumes.  You may wear deodorant. Do NOT shave for 48 hours prior to surgery. Do NOT bring valuables to the hospital. Contacts, dentures, or bridgework may not be worn into surgery.  Have a responsible adult drive you home and stay with you for 24 hours after your procedure  Bring a copy of your healthcare power of attorney and living will documents.  **Effective Friday, Jan. 12, 2018, [place] will implement no hospital visitations from children age 90 and younger due to a steady increase in flu activity in our community and hospitals. **

## 2016-02-21 NOTE — H&P (Signed)
Office Visit   02/11/2016 Black, MD  Obstetrics and Gynecology   Chronic vulvitis +2 more  Dx   Surgery Consult ; Referred by Carol Ada, MD  Reason for Visit   Additional Documentation   Vitals:   BP 130/78 (BP Location: Right Arm, Patient Position: Sitting, Cuff Size: Normal)   Pulse 90   Ht 5\' 2"  (1.575 m)   Wt 180 lb 12.8 oz (82 kg)   LMP 07/18/2011   BMI 33.07 kg/m   BSA 1.89 m   Flowsheets:   Custom Formula Data,   MEWS Score,   Anthropometrics,   Infectious Disease Screening     Encounter Info:   Billing Info,   History,   Allergies,   Detailed Report     All Notes   Progress Notes by Nunzio Cobbs, MD at 02/11/2016 2:30 PM   Author: Nunzio Cobbs, MD Author Type: Physician Filed: 02/14/2016 9:07 AM  Note Status: Signed Cosign: Cosign Not Required Encounter Date: 02/11/2016 2:30 PM  Editor: Nunzio Cobbs, MD (Physician)  Prior Versions: 1. Lowella Fairy, CMA (Certified Medical Assistant) at 02/11/2016 2:37 PM - Sign at close encounter    GYNECOLOGY  VISIT   HPI: 66 y.o.   Married  Caucasian  female   [phone] with Patient's last menstrual period was 07/18/2011.   here for surgery consult.    Patient seen 12/30/15 for postmenopausal bleeding which occurred in December 2017.  She had an attempted sonohysterogram and EMB done the following day and was diagnosed with a cystic endometrial mass with a feeder vessel.  Cannula would not pass for the sonohysterogram.  EMB done to 7 am and specimen was nondiagnostic.  Repeat EMB performed on 01/27/16 and only atrophy noted.  Had PMB in January on 14, 15, and 16.   Patient shortly after her December appointment was diagnosed with community pneumonia and has hospitalized.  Received azithromycin.  Has now been cleared by her medical physician to proceed with surgery.  She had a CT scan done to evaluate for pelvic/vulvar  lymphadenopathy and she was diagnosed with community acquired pneumonia, atherosclerosus, and no lymphadenopathy.   I did a wound culture of a weeping area of the right groin, and this was positive for Group B strep which I treated with Ampicillin.   BV was diagnosed by [facility] on 01/27/16 and treated with Flagyl.   Medicare benefits start Feb. 1.  Grand daughter marries in March.  Patient is living with ex-husband. They are sexually active. Patient had negative STD testing on 01/27/16.  She also had a wound culture showing Group B strep and was treated with Ampicillin.   PCP - Dr. Jeremy Johann  GYNECOLOGIC HISTORY: Patient's last menstrual period was 07/18/2011. Contraception:  Postmenopausal Menopausal hormone therapy:  none Last mammogram:  11/05/15 BIRADS1, Density B Last pap smear: 12-30-15 Neg:Neg HR HPV                   OB History    Gravida Para Term Preterm AB Living   3 3 3     3    SAB TAB Ectopic Multiple Live Births           3             Patient Active Problem List   Diagnosis Date Noted  . Hypertriglyceridemia 10/07/2015  . Abnormal nuclear stress test   .  Chest pain 07/23/2015  . Palpitations 07/23/2015  . Depression   . Left-sided weakness 05/24/2014  . Hyponatremia 05/24/2014  . Right arm numbness 05/24/2014  . Alcohol abuse 05/24/2014  . Alcohol abuse with intoxication ([place]) 05/24/2014  . Syncope and collapse 05/24/2014  . CVA (cerebral infarction)   . Essential hypertension   . Hyperlipidemia 04/04/2013        Past Medical History:  Diagnosis Date  . Alcohol intoxication ([place]) 05/2014    ED note   . Anxiety   . Community acquired pneumonia 01/05/2016  . Hepatic cyst 01/06/2016  . Hyperlipidemia   . Hypertension   . Migraines    with aura  . 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 Osteopenia   . Postmenopausal bleeding 12/2015  . 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 BREAST ENHANCEMENT SURGERY    . CARDIAC CATHETERIZATION N/A 08/12/2015   Procedure: Left Heart Cath and Coronary Angiography;  Surgeon: Jettie Booze, MD;  Location: [facility];  Service: Cardiovascular;  Laterality: N/A;  . FACIAL COSMETIC SURGERY    . HAND SURGERY     nerve & tendon repair  . LUMBAR EPIDURAL INJECTION  06/16/2015          Current Outpatient Prescriptions  Medication Sig Dispense Refill  . aspirin 325 MG tablet Take 325 mg by mouth at bedtime.     . cholecalciferol (VITAMIN D) 1000 units tablet Take 5,000 Units by mouth daily.     Marland Kitchen MAGNESIUM PO Take 1 tablet by mouth daily.     . Omega-3 Fatty Acids (FISH OIL) 1000 MG CAPS Take 1 capsule (1,000 mg total) by mouth 2 (two) times daily. 60 capsule 0  . QUEtiapine (SEROQUEL) 100 MG tablet Take 200-300 mg by mouth at bedtime.    . ranitidine (ZANTAC) 75 MG tablet Take 75 mg by mouth daily as needed for heartburn.    . sertraline (ZOLOFT) 100 MG tablet Take 100 mg by mouth at bedtime.     . valsartan (DIOVAN) 320 MG tablet Take 320 mg by mouth at bedtime.     No current facility-administered medications for this visit.      ALLERGIES: Codeine; Crestor [rosuvastatin]; Other; and Simvastatin        Family History  Problem Relation Age of Onset  . Stroke Father   . Depression Father   . Stroke Mother   . Hypertension Brother   . Heart disease Brother     stint  . Heart attack Brother   . Heart attack Maternal Uncle   . Heart attack Maternal Grandmother   . Heart attack Brother   . Heart attack Maternal Uncle   . Heart attack Maternal Uncle   . Heart attack Maternal Uncle   . Heart attack Maternal Uncle     Social History        Social History  . Marital status: Married    Spouse name: N/A  . Number of children: N/A  . Years of education: N/A      Occupational History  .  Not on file.        Social History Main Topics  . Smoking status: Former Research scientist (life sciences)  . Smokeless tobacco: Never Used  . Alcohol use 2.4 oz/week    4 Glasses of wine per week  . Drug use:  No  . Sexual activity: Yes    Partners: Male    Birth control/ protection: Post-menopausal       Other Topics Concern  . Not on file      Social History Narrative  . No narrative on file    ROS:  Pertinent items are noted in HPI.  PHYSICAL EXAMINATION:    BP 130/78 (BP Location: Right Arm, Patient Position: Sitting, Cuff Size: Normal)   Pulse 90   Ht 5\' 2"  (1.575 m)   Wt 180 lb 12.8 oz (82 kg)   LMP 07/18/2011   BMI 33.07 kg/m     General appearance: alert, cooperative and appears stated age Head: Normocephalic, without obvious abnormality, atraumatic Neck: no adenopathy, supple, symmetrical, trachea midline and thyroid normal to inspection and palpation Lungs: clear to auscultation bilaterally Heart: regular rate and rhythm Abdomen: soft, non-tender, no masses,  no organomegaly Extremities: extremities normal, atraumatic, no cyanosis or edema Skin: Skin color, texture, turgor normal. No rashes or lesions Lymph nodes: Cervical, supraclavicular, and axillary nodes normal. No abnormal inguinal nodes palpated Neurologic: Grossly normal  Pelvic: External genitalia: right intertriginous area with erythema consistent with yeast.  Right inguinal fold with ulcerative area that is smaller than at the last visit.               Urethra:  normal appearing urethra with no masses, tenderness or lesions              Bartholins and Skenes: normal                 Vagina: normal appearing vagina with normal color and discharge, no lesions              Cervix: no lesions                Bimanual Exam:  Uterus:  normal size, contour, position, consistency, mobility, non-tender              Adnexa: no mass, fullness, tenderness         Chaperone was present for  exam.  ASSESSMENT  Postmenopausal bleeding.  Endometrial mass with previous nondiagnostic EMB x 2.  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 Candida of intertriginous area.  Right inguinal/vulvar ulcer - resolving with Ampicillin abx. 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  PLAN  Will treat with a course of Diflucan and Mycolog II cream.  Will proceed with hysteroscopy with Myosure resection of endometrial mass, dilation and curettage. Risks, benefits, and alternatives reviewed with the patient who wishes to proceed.  Surgical expectations and recovery reviewed.  If ulcerative area is still present at the time of surgery, will recommend a biopsy of this area.  An After Visit Summary was printed and given to the patient.  _25____ minutes face to face time of which over 50% was spent in counseling.

## 2016-02-22 ENCOUNTER — Encounter (HOSPITAL_COMMUNITY): Payer: Self-pay | Admitting: *Deleted

## 2016-02-22 ENCOUNTER — Ambulatory Visit (HOSPITAL_COMMUNITY): Payer: PPO | Admitting: Certified Registered Nurse Anesthetist

## 2016-02-22 ENCOUNTER — Ambulatory Visit (HOSPITAL_COMMUNITY)
Admission: RE | Admit: 2016-02-22 | Discharge: 2016-02-22 | Disposition: A | Discharge status: Home / Self Care | Payer: PPO | Source: Ambulatory Visit | Attending: Obstetrics and Gynecology | Admitting: Obstetrics and Gynecology

## 2016-02-22 ENCOUNTER — Encounter (HOSPITAL_COMMUNITY)
Admission: RE | Disposition: A | Discharge status: Home / Self Care | Payer: Self-pay | Source: Ambulatory Visit | Attending: Obstetrics and Gynecology

## 2016-02-22 DIAGNOSIS — E781 Pure hyperglyceridemia: Secondary | ICD-10-CM | POA: Insufficient documentation

## 2016-02-22 DIAGNOSIS — N858 Other specified noninflammatory disorders of uterus: Secondary | ICD-10-CM | POA: Diagnosis not present

## 2016-02-22 DIAGNOSIS — N840 Polyp of corpus uteri: Secondary | ICD-10-CM | POA: Diagnosis not present

## 2016-02-22 DIAGNOSIS — I10 Essential (primary) hypertension: Secondary | ICD-10-CM | POA: Insufficient documentation

## 2016-02-22 DIAGNOSIS — Z79899 Other long term (current) drug therapy: Secondary | ICD-10-CM | POA: Diagnosis not present

## 2016-02-22 DIAGNOSIS — Z888 Allergy status to other drugs, medicaments and biological substances status: Secondary | ICD-10-CM | POA: Insufficient documentation

## 2016-02-22 DIAGNOSIS — Z87891 Personal history of nicotine dependence: Secondary | ICD-10-CM | POA: Insufficient documentation

## 2016-02-22 DIAGNOSIS — F329 Major depressive disorder, single episode, unspecified: Secondary | ICD-10-CM | POA: Insufficient documentation

## 2016-02-22 DIAGNOSIS — Z7982 Long term (current) use of aspirin: Secondary | ICD-10-CM | POA: Insufficient documentation

## 2016-02-22 DIAGNOSIS — E785 Hyperlipidemia, unspecified: Secondary | ICD-10-CM | POA: Insufficient documentation

## 2016-02-22 DIAGNOSIS — D287 Benign neoplasm of other specified female genital organs: Secondary | ICD-10-CM | POA: Diagnosis not present

## 2016-02-22 DIAGNOSIS — L720 Epidermal cyst: Secondary | ICD-10-CM | POA: Insufficient documentation

## 2016-02-22 DIAGNOSIS — I639 Cerebral infarction, unspecified: Secondary | ICD-10-CM | POA: Diagnosis not present

## 2016-02-22 DIAGNOSIS — N8500 Endometrial hyperplasia, unspecified: Secondary | ICD-10-CM | POA: Diagnosis not present

## 2016-02-22 DIAGNOSIS — N950 Postmenopausal bleeding: Secondary | ICD-10-CM | POA: Insufficient documentation

## 2016-02-22 HISTORY — PX: SKIN BIOPSY: SHX1

## 2016-02-22 HISTORY — PX: DILATATION & CURETTAGE/HYSTEROSCOPY WITH MYOSURE: SHX6511

## 2016-02-22 SURGERY — DILATATION & CURETTAGE/HYSTEROSCOPY WITH MYOSURE
Anesthesia: General | Site: Vagina | Laterality: Right

## 2016-02-22 MED ORDER — PROMETHAZINE HCL 25 MG/ML IJ SOLN
INTRAMUSCULAR | Status: AC
  Administered 2016-02-22: 6.25 mg
  Filled 2016-02-22: qty 1

## 2016-02-22 MED ORDER — METOCLOPRAMIDE HCL 5 MG/ML IJ SOLN
10.0000 mg | Freq: Once | INTRAMUSCULAR | Status: AC | PRN
  Administered 2016-02-22: 10 mg via INTRAVENOUS

## 2016-02-22 MED ORDER — ONDANSETRON HCL 4 MG/2ML IJ SOLN
INTRAMUSCULAR | Status: DC | PRN
  Administered 2016-02-22: 4 mg via INTRAVENOUS

## 2016-02-22 MED ORDER — IBUPROFEN 800 MG PO TABS: 800.0000 mg | ORAL_TABLET | Freq: Three times a day (TID) | ORAL | 0 refills | Status: DC | PRN

## 2016-02-22 MED ORDER — OXYCODONE-ACETAMINOPHEN 5-325 MG PO TABS: 2.0000 | ORAL_TABLET | ORAL | 0 refills | Status: DC | PRN

## 2016-02-22 MED ORDER — FENTANYL CITRATE (PF) 100 MCG/2ML IJ SOLN
INTRAMUSCULAR | Status: DC | PRN
  Administered 2016-02-22 (×2): 50 ug via INTRAVENOUS

## 2016-02-22 MED ORDER — ONDANSETRON HCL 4 MG/2ML IJ SOLN
INTRAMUSCULAR | Status: AC
  Filled 2016-02-22: qty 2

## 2016-02-22 MED ORDER — MIDAZOLAM HCL 2 MG/2ML IJ SOLN
INTRAMUSCULAR | Status: AC
  Filled 2016-02-22: qty 2

## 2016-02-22 MED ORDER — PROPOFOL 10 MG/ML IV BOLUS
INTRAVENOUS | Status: DC | PRN
  Administered 2016-02-22: 20 mg via INTRAVENOUS
  Administered 2016-02-22: 180 mg via INTRAVENOUS

## 2016-02-22 MED ORDER — DEXAMETHASONE SODIUM PHOSPHATE 10 MG/ML IJ SOLN
INTRAMUSCULAR | Status: AC
  Filled 2016-02-22: qty 1

## 2016-02-22 MED ORDER — PHENYLEPHRINE 40 MCG/ML (10ML) SYRINGE FOR IV PUSH (FOR BLOOD PRESSURE SUPPORT)
PREFILLED_SYRINGE | INTRAVENOUS | Status: DC | PRN
Start: 2016-02-22 — End: 2016-02-22
  Administered 2016-02-22 (×3): 80 ug via INTRAVENOUS

## 2016-02-22 MED ORDER — LIDOCAINE HCL (CARDIAC) 20 MG/ML IV SOLN
INTRAVENOUS | Status: DC | PRN
  Administered 2016-02-22: 100 mg via INTRAVENOUS

## 2016-02-22 MED ORDER — KETOROLAC TROMETHAMINE 30 MG/ML IJ SOLN
INTRAMUSCULAR | Status: AC
  Filled 2016-02-22: qty 1

## 2016-02-22 MED ORDER — SODIUM CHLORIDE 0.9 % IR SOLN
Status: DC | PRN
  Administered 2016-02-22: 3000 mL

## 2016-02-22 MED ORDER — FENTANYL CITRATE (PF) 100 MCG/2ML IJ SOLN
INTRAMUSCULAR | Status: AC
  Filled 2016-02-22: qty 2

## 2016-02-22 MED ORDER — GLYCOPYRROLATE 0.2 MG/ML IJ SOLN
INTRAMUSCULAR | Status: DC | PRN
  Administered 2016-02-22: 0.1 mg via INTRAVENOUS

## 2016-02-22 MED ORDER — METOCLOPRAMIDE HCL 5 MG/ML IJ SOLN
INTRAMUSCULAR | Status: AC
  Administered 2016-02-22: 10 mg via INTRAVENOUS
  Filled 2016-02-22: qty 2

## 2016-02-22 MED ORDER — SCOPOLAMINE 1 MG/3DAYS TD PT72
MEDICATED_PATCH | TRANSDERMAL | Status: AC
  Administered 2016-02-22: 1.5 mg via TRANSDERMAL
  Filled 2016-02-22: qty 1

## 2016-02-22 MED ORDER — MEPERIDINE HCL 25 MG/ML IJ SOLN: 6.2500 mg | INTRAMUSCULAR | Status: DC | PRN

## 2016-02-22 MED ORDER — GLYCOPYRROLATE 0.2 MG/ML IJ SOLN
INTRAMUSCULAR | Status: AC
  Filled 2016-02-22: qty 1

## 2016-02-22 MED ORDER — PHENYLEPHRINE 40 MCG/ML (10ML) SYRINGE FOR IV PUSH (FOR BLOOD PRESSURE SUPPORT)
PREFILLED_SYRINGE | INTRAVENOUS | Status: AC
  Filled 2016-02-22: qty 10

## 2016-02-22 MED ORDER — LIDOCAINE HCL (CARDIAC) 20 MG/ML IV SOLN
INTRAVENOUS | Status: AC
  Filled 2016-02-22: qty 5

## 2016-02-22 MED ORDER — FENTANYL CITRATE (PF) 100 MCG/2ML IJ SOLN
25.0000 ug | INTRAMUSCULAR | Status: DC | PRN
  Administered 2016-02-22 (×2): 50 ug via INTRAVENOUS

## 2016-02-22 MED ORDER — PROPOFOL 10 MG/ML IV BOLUS
INTRAVENOUS | Status: AC
  Filled 2016-02-22: qty 20

## 2016-02-22 MED ORDER — LACTATED RINGERS IV SOLN: INTRAVENOUS | Status: DC

## 2016-02-22 MED ORDER — DEXAMETHASONE SODIUM PHOSPHATE 4 MG/ML IJ SOLN
INTRAMUSCULAR | Status: DC | PRN
  Administered 2016-02-22: 4 mg via INTRAVENOUS

## 2016-02-22 MED ORDER — PROMETHAZINE HCL 25 MG/ML IJ SOLN: 6.2500 mg | INTRAMUSCULAR | Status: DC | PRN

## 2016-02-22 MED ORDER — CEFAZOLIN SODIUM-DEXTROSE 2-4 GM/100ML-% IV SOLN
2.0000 g | INTRAVENOUS | Status: AC
  Administered 2016-02-22: 2 g via INTRAVENOUS

## 2016-02-22 MED ORDER — FENTANYL CITRATE (PF) 100 MCG/2ML IJ SOLN
INTRAMUSCULAR | Status: AC
  Administered 2016-02-22: 50 ug via INTRAVENOUS
  Filled 2016-02-22: qty 2

## 2016-02-22 MED ORDER — LIDOCAINE HCL 1 % IJ SOLN
INTRAMUSCULAR | Status: AC
  Filled 2016-02-22: qty 20

## 2016-02-22 MED ORDER — KETOROLAC TROMETHAMINE 30 MG/ML IJ SOLN
INTRAMUSCULAR | Status: DC | PRN
  Administered 2016-02-22: 30 mg via INTRAVENOUS

## 2016-02-22 MED ORDER — MIDAZOLAM HCL 2 MG/2ML IJ SOLN
INTRAMUSCULAR | Status: DC | PRN
  Administered 2016-02-22 (×2): 1 mg via INTRAVENOUS

## 2016-02-22 MED ORDER — LACTATED RINGERS IV SOLN
INTRAVENOUS | Status: DC
  Administered 2016-02-22 (×2): via INTRAVENOUS

## 2016-02-22 MED ORDER — LIDOCAINE HCL 1 % IJ SOLN
INTRAMUSCULAR | Status: DC | PRN
  Administered 2016-02-22: 10 mL

## 2016-02-22 MED ORDER — SCOPOLAMINE 1 MG/3DAYS TD PT72
1.0000 | MEDICATED_PATCH | Freq: Once | TRANSDERMAL | Status: DC
  Administered 2016-02-22: 1.5 mg via TRANSDERMAL

## 2016-02-22 SURGICAL SUPPLY — 26 items
BLADE SURG 15 STRL LF C SS BP (BLADE) ×2 IMPLANT
BLADE SURG 15 STRL SS (BLADE) ×2
CANISTER SUCT 3000ML (MISCELLANEOUS) ×4 IMPLANT
CATH ROBINSON RED A/P 16FR (CATHETERS) ×4 IMPLANT
CLOTH BEACON ORANGE TIMEOUT ST (SAFETY) ×4 IMPLANT
CONTAINER PREFILL 10% NBF 60ML (FORM) ×8 IMPLANT
DEVICE MYOSURE LITE (MISCELLANEOUS) IMPLANT
DEVICE MYOSURE REACH (MISCELLANEOUS) IMPLANT
DRSG COVADERM PLUS 2X2 (GAUZE/BANDAGES/DRESSINGS) ×4 IMPLANT
ELECT REM PT RETURN 9FT ADLT (ELECTROSURGICAL)
ELECTRODE REM PT RTRN 9FT ADLT (ELECTROSURGICAL) IMPLANT
FILTER ARTHROSCOPY CONVERTOR (FILTER) ×4 IMPLANT
GLOVE BIO SURGEON STRL SZ 6.5 (GLOVE) ×3 IMPLANT
GLOVE BIO SURGEONS STRL SZ 6.5 (GLOVE) ×1
GLOVE BIOGEL PI IND STRL 7.0 (GLOVE) ×4 IMPLANT
GLOVE BIOGEL PI INDICATOR 7.0 (GLOVE) ×4
GOWN STRL REUS W/TWL LRG LVL3 (GOWN DISPOSABLE) ×8 IMPLANT
PACK VAGINAL MINOR WOMEN LF (CUSTOM PROCEDURE TRAY) ×4 IMPLANT
PAD OB MATERNITY 4.3X12.25 (PERSONAL CARE ITEMS) ×4 IMPLANT
PENCIL BUTTON HOLSTER BLD 10FT (ELECTRODE) ×4 IMPLANT
SEAL ROD LENS SCOPE MYOSURE (ABLATOR) ×4 IMPLANT
SUT VIC AB 3-0 PS2 18 (SUTURE) ×4 IMPLANT
TOWEL OR 17X24 6PK STRL BLUE (TOWEL DISPOSABLE) ×8 IMPLANT
TUBING AQUILEX INFLOW (TUBING) ×4 IMPLANT
TUBING AQUILEX OUTFLOW (TUBING) ×4 IMPLANT
WATER STERILE IRR 1000ML POUR (IV SOLUTION) ×4 IMPLANT

## 2016-02-22 NOTE — Progress Notes (Signed)
Update to History and Physical   No marked change in status.  Patient examined.  OK to proceed with surgery.  Will do a biopsy of the right groin ulceration in addition to hysteroscopic resection of endometrial mass and dilation and curettage.

## 2016-02-22 NOTE — Anesthesia Procedure Notes (Signed)
Procedure Name: LMA Insertion Date/Time: 02/22/2016 12:20 PM Performed by: Montez Hageman Pre-anesthesia Checklist: Patient identified, Patient being monitored, Emergency Drugs available, Timeout performed and Suction available Patient Re-evaluated:Patient Re-evaluated prior to inductionOxygen Delivery Method: Circle System Utilized Preoxygenation: Pre-oxygenation with 100% oxygen Intubation Type: IV induction Ventilation: Mask ventilation without difficulty LMA: LMA inserted LMA Size: 4.0 Number of attempts: 1 Placement Confirmation: positive ETCO2 and breath sounds checked- equal and bilateral Tube secured with: Tape

## 2016-02-22 NOTE — Transfer of Care (Signed)
Immediate Anesthesia Transfer of Care Note  Patient: Vicki [name]  Procedure(s) Performed: Procedure(s): DILATATION & CURETTAGE/HYSTEROSCOPY WITH MYOSURE (N/A) BIOPSY SKIN (Right)  Patient Location: PACU  Anesthesia Type:General  Level of Consciousness: awake, alert  and oriented  Airway & Oxygen Therapy: Patient Spontanous Breathing and Patient connected to nasal cannula oxygen  Post-op Assessment: Report given to RN, Post -op Vital signs reviewed and stable and Patient moving all extremities X 4  Post vital signs: Reviewed and stable  Last Vitals:  Vitals:   02/22/16 0916 02/22/16 1030  BP: (!) 128/102 123/88  Pulse: (!) 101   Resp: 18   Temp: 36.7 C     Last Pain:  Vitals:   02/22/16 0916  TempSrc: Oral  PainSc: 0-No pain      Patients Stated Pain Goal: 4 ([id number])  Complications: No apparent anesthesia complications

## 2016-02-22 NOTE — Discharge Instructions (Signed)
DISCHARGE INSTRUCTIONS: D&C / D&E The following instructions have been prepared to help you care for yourself upon your return home.   Personal hygiene:  Use sanitary pads for vaginal drainage, not tampons.  Shower the day after your procedure.  NO tub baths, pools or Jacuzzis for 2-3 weeks.  Wipe front to back after using the bathroom.  Activity and limitations:  Do NOT drive or operate any equipment for 24 hours. The effects of anesthesia are still present and drowsiness may result.  Do NOT rest in bed all day.  Walking is encouraged.  Walk up and down stairs slowly.  You may resume your normal activity in one to two days or as indicated by your physician.  Sexual activity: NO intercourse for at least 2 weeks after the procedure, or as indicated by your physician.  Diet: Eat a light meal as desired this evening. You may resume your usual diet tomorrow.  Return to work: You may resume your work activities in one to two days or as indicated by your doctor.  What to expect after your surgery: Expect to have vaginal bleeding/discharge for 2-3 days and spotting for up to 10 days. It is not unusual to have soreness for up to 1-2 weeks. You may have a slight burning sensation when you urinate for the first day. Mild cramps may continue for a couple of days. You may have a regular period in 2-6 weeks.  Call your doctor for any of the following:  Excessive vaginal bleeding, saturating and changing one pad every hour.  Inability to urinate 6 hours after discharge from hospital.  Pain not relieved by pain medication.  Fever of 100.4 F or greater.  Unusual vaginal discharge or odor.   Call for an appointment:    Patients signature: ______________________  Nurses signature ________________________  Support person's signature_______________________   Hysteroscopy, Care After Refer to this sheet in the next few weeks. These instructions provide you with information on  caring for yourself after your procedure. Your health care provider may also give you more specific instructions. Your treatment has been planned according to current medical practices, but problems sometimes occur. Call your health care provider if you have any problems or questions after your procedure.  WHAT TO EXPECT AFTER THE PROCEDURE After your procedure, it is typical to have the following:  You may have some cramping. This normally lasts for a couple days.  You may have bleeding. This can vary from light spotting for a few days to menstrual-like bleeding for 3-7 days. HOME CARE INSTRUCTIONS  Rest for the first 1-2 days after the procedure.  Only take over-the-counter or prescription medicines as directed by your health care provider. Do not take aspirin. It can increase the chances of bleeding.  Take showers instead of baths for 2 weeks or as directed by your health care provider.  Do not drive for 24 hours or as directed.  Do not drink alcohol while taking pain medicine.  Do not use tampons, douche, or have sexual intercourse for 2 weeks or until your health care provider says it is okay.  Take your temperature twice a day for 4-5 days. Write it down each time.  Follow your health care provider's advice about diet, exercise, and lifting.  If you develop constipation, you may:  Take a mild laxative if your health care provider approves.  Add bran foods to your diet.  Drink enough fluids to keep your urine clear or pale yellow.  Try to have  someone with you or available to you for the first 24-48 hours, especially if you were given a general anesthetic.  Follow up with your health care provider as directed. SEEK MEDICAL CARE IF:  You feel dizzy or lightheaded.  You feel sick to your stomach (nauseous).  You have abnormal vaginal discharge.  You have a rash.  You have pain that is not controlled with medicine. SEEK IMMEDIATE MEDICAL CARE IF:  You have bleeding  that is heavier than a normal menstrual period.  You have a fever.  You have increasing cramps or pain, not controlled with medicine.  You have new belly (abdominal) pain.  You pass out.  You have pain in the tops of your shoulders (shoulder strap areas).  You have shortness of breath. This information is not intended to replace advice given to you by your health care provider. Make sure you discuss any questions you have with your health care provider. Document Released: 10/24/2012 Document Reviewed: 10/24/2012 Elsevier Interactive Patient Education  2017 [place].   Dilation and Curettage or Vacuum Curettage, Care After This sheet gives you information about how to care for yourself after your procedure. Your health care provider may also give you more specific instructions. If you have problems or questions, contact your health care provider. What can I expect after the procedure? After your procedure, it is common to have:  Mild pain or cramping.  Some vaginal bleeding or spotting. These may last for up to 2 weeks after your procedure. Follow these instructions at home: Activity  Do not drive or use heavy machinery while taking prescription pain medicine.  Avoid driving for the first 24 hours after your procedure.  Take frequent, short walks, followed by rest periods, throughout the day. Ask your health care provider what activities are safe for you. After 1-2 days, you may be able to return to your normal activities.  Do not lift anything heavier than 10 lb (4.5 kg) until your health care provider approves.  For at least 2 weeks, or as long as told by your health care provider, do not:  Douche.  Use tampons.  Have sexual intercourse. General instructions  Take over-the-counter and prescription medicines only as told by your health care provider. This is especially important if you take blood thinning medicine.  Do not take baths, swim, or use a hot tub until  your health care provider approves. Take showers instead of baths.  Wear compression stockings as told by your health care provider. These stockings help to prevent blood clots and reduce swelling in your legs.  It is your responsibility to get the results of your procedure. Ask your health care provider, or the department performing the procedure, when your results will be ready.  Keep all follow-up visits as told by your health care provider. This is important. Contact a health care provider if:  You have severe cramps that get worse or that do not get better with medicine.  You have severe abdominal pain.  You cannot drink fluids without vomiting.  You develop pain in a different area of your pelvis.  You have bad-smelling vaginal discharge.  You have a rash. Get help right away if:  You have vaginal bleeding that soaks more than one sanitary pad in 1 hour, for 2 hours in a row.  You pass large blood clots from your vagina.  You have a fever that is above 100.28F (38.0C).  Your abdomen feels very tender or hard.  You have chest  pain.  You have shortness of breath.  You cough up blood.  You feel dizzy or light-headed.  You faint.  You have pain in your neck or shoulder area. This information is not intended to replace advice given to you by your health care provider. Make sure you discuss any questions you have with your health care provider. Document Released: 01/01/2000 Document Revised: 09/02/2015 Document Reviewed: 08/06/2015 Elsevier Interactive Patient Education  2017 [place] your thigh area clean and dry for 24 hours.  You may bathe tomorrow evening and use antibacterial soap and water.  You can keep the area covered with a bandage.  Please call if the area develops redness, increased heat, or active bleeding.  Ice may help to reduce swelling of this area.

## 2016-02-22 NOTE — Anesthesia Postprocedure Evaluation (Signed)
Anesthesia Post Note  Patient: Vicki [name]  Procedure(s) Performed: Procedure(s) (LRB): DILATATION & CURETTAGE/HYSTEROSCOPY WITH MYOSURE (N/A) BIOPSY SKIN (Right)  Patient location during evaluation: PACU Anesthesia Type: General Level of consciousness: awake and alert Pain management: pain level controlled Vital Signs Assessment: post-procedure vital signs reviewed and stable Respiratory status: spontaneous breathing, nonlabored ventilation, respiratory function stable and patient connected to nasal cannula oxygen Cardiovascular status: blood pressure returned to baseline and stable Postop Assessment: no signs of nausea or vomiting Anesthetic complications: no        Last Vitals:  Vitals:   02/22/16 1430 02/22/16 1500  BP: 103/76 122/80  Pulse: 85 79  Resp: 17 11  Temp:      Last Pain:  Vitals:   02/22/16 1500  TempSrc:   PainSc: 2    Pain Goal: Patients Stated Pain Goal: 4 (02/22/16 0916)               Montez Hageman

## 2016-02-22 NOTE — Op Note (Signed)
OPERATIVE REPORT   PREOPERATIVE DIAGNOSES:   Postmenopausal bleeding, endometrial mass, nondiagnostic endometrial biopsy, right thigh ulcerative lesion.  POSTOPERATIVE DIAGNOSES:   Postmenopausal bleeding, endometrial mass, nondiagnostic endometrial biopsy, right thigh ulcerative lesion.  PROCEDURE:  Hysteroscopy with dilation and curettage and Myosure resection of endometrial polyp, biopsy of right thigh ulcerative lesion.  SURGEON:  Lenard Galloway, MD  ANESTHESIA:  LMA, paracervical block with 10 mL of 1% lidocaine.  IV FLUIDS:  1000 cc LR  EBL:  5 cc  URINE OUTPUT:  30 cc  NORMAL SALINE DEFICIT:   65 cc.  COMPLICATIONS:  None.  INDICATIONS FOR THE PROCEDURE:     The patient is a 66 year old G52P3 Caucasian female who presents with postmenopausal bleeding and ultrasound showing an endometrial mass with a feeder vessel. Office endometrial biopsy has been nondiagnostic.  The patient has a persistent right thigh ulcer at the margin of her right vulva.  She has had negative STD testing and a wound culture showing group B strep which has been treated with antibiotics.  She has a history of right thigh squamous cell carcinoma.   A plan is now made to proceed with a hysteroscopy with dilation and curettage and Myosure resection of endometrial polyp and biopsy of right thigh lesion; after risks, benefits and alternatives were reviewed.  FINDINGS:  Exam under anesthesia revealed a small retroverted uterus.  No adnexal masses were palpable. The uterus was sounded to 7.5 cm. Hysteroscopy showed a 1.5 cm polyp attached to the right anterior fundus. Endometrial currettings were scanty.   The right medial thigh at the border of the right vulva demonstrates a 1.5 cm ulcerative opening of the skin.   SPECIMENS:  The endometrial polyp, endometrial curettings, and right thigh biopsy were sent to Pathology separately.   PROCEDURE IN DETAIL:  The patient was  reidentified in the preoperative hold area.  She did receive Ancef IV for antibiotic prophylaxis and she received TED hose and PAS stockings for DVT prophylaxis.  In the operating room, the patient was placed in the dorsal lithotomy position and then an LMA anesthetic was introduced.  The patient's lower abdomen, vagina and perineum were sterilely prepped with Betadine and the  patient's bladder was catheterized of urine.  She was sterilly draped  An exam under anesthesia was performed.  A speculum was placed inside the vagina and a single-tooth tenaculum was placed on the anterior cervical lip.  A paracervical block was performed with a total of 10 mL of 1% lidocaine plain.  The uterus was sounded. The cervix was dilated to a #23 Pratt dilator.  The MyoSure hysteroscope was then inserted inside the uterine cavity under the continuous infusion of normal saline solution.  Findings are as noted above.  The Myosure removed the entire poly without difficulty. This was sent to pathology.  The MyoSure hysteroscope was removed.  The cervix was further dilated to a #25 Pratt dilator.  The sharp and then a serrated curette were introduced into the uterine cavity and the endometrium was curetted in all 4 quadrants.  A minimal amount of endometrial curettings was obtained.  This specimen was sent to Pathology.  The single-tooth tenaculum which had been placed on the anterior cervical lip was removed.  Hemostasis was good, and all of the vaginal  instruments were removed.  The right thigh lesion was assessed at this time.  Due to the ulcerative nature of the lesion, a margin was excised around the lesion, and a small portion  was removed from the base of the ulceration, and this represented subcutaneous tissue.  The specimen was sent to pathology.  The base was  cauterized for hemostasis, and the skin edges were then closed with interrupted  sutures of 3/0 Vicryl.  The patient was awakened and  escorted to the recovery room in stable condition after she was cleansed with Betadine.  There were no complications to the procedure.  All needle, instrument and sponge counts were correct.  Lenard Galloway, MD

## 2016-02-22 NOTE — Anesthesia Preprocedure Evaluation (Signed)
Anesthesia Evaluation  Patient identified by MRN, date of birth, ID band Patient awake    Reviewed: Allergy & Precautions, NPO status , Patient's Chart, lab work & pertinent test results  History of Anesthesia Complications (+) PONV  Airway Mallampati: II  TM Distance: >3 FB Neck ROM: Full    Dental no notable dental hx.    Pulmonary former smoker,    Pulmonary exam normal breath sounds clear to auscultation       Cardiovascular hypertension, Normal cardiovascular exam Rhythm:Regular Rate:Normal     Neuro/Psych Seizures -,  Anxiety Depression TIACVA, No Residual Symptoms negative psych ROS   GI/Hepatic negative GI ROS, Neg liver ROS,   Endo/Other  negative endocrine ROS  Renal/GU negative Renal ROS  negative genitourinary   Musculoskeletal  (+) Arthritis , Fibromyalgia -  Abdominal   Peds negative pediatric ROS (+)  Hematology negative hematology ROS (+)   Anesthesia Other Findings   Reproductive/Obstetrics negative OB ROS                            Anesthesia Physical Anesthesia Plan  ASA: III  Anesthesia Plan: General   Post-op Pain Management:    Induction: Intravenous  Airway Management Planned: LMA  Additional Equipment:   Intra-op Plan:   Post-operative Plan:   Informed Consent: I have reviewed the patients History and Physical, chart, labs and discussed the procedure including the risks, benefits and alternatives for the proposed anesthesia with the patient or authorized representative who has indicated his/her understanding and acceptance.   Dental advisory given  Plan Discussed with: CRNA  Anesthesia Plan Comments:         Anesthesia Quick Evaluation

## 2016-02-22 NOTE — OR Nursing (Signed)
Specimen timeout completed with Dr. Quincy Simmonds and team.

## 2016-02-22 NOTE — Brief Op Note (Signed)
02/22/2016  1:25 PM  PATIENT:  Vicki [name]  66 y.o. female  PRE-OPERATIVE DIAGNOSIS:  PMB, non diagnostic endometrial biopsy, endometrial mass, right thigh ulcerative lesion  POST-OPERATIVE DIAGNOSIS:  PMB, non diagnostic endometrial biopsy, endometrial mass, right thigh ulcerative lesion  PROCEDURE:  Procedure(s): DILATATION & CURETTAGE/HYSTEROSCOPY WITH MYOSURE (N/A) BIOPSY SKIN (Right)  SURGEON:  Surgeon(s) and Role:    * Carlethia Mesquita E Yisroel Ramming, MD - Primary  PHYSICIAN ASSISTANT: NA  ASSISTANTS: none   ANESTHESIA:    LMA, Paracervical block with 1% lidocaine.  EBL:  Total I/O In: 1000 [I.V.:1000] Out: 35 [Urine:30; Blood:5]  NS deficit:  65 cc.  BLOOD ADMINISTERED:none  DRAINS: none   LOCAL MEDICATIONS USED:  LIDOCAINE  and Amount:  9 ml  SPECIMEN:  Source of Specimen:  Endometrial polyp, endometrial curettings, right thigh biopsy  DISPOSITION OF SPECIMEN:  PATHOLOGY  COUNTS:  YES  TOURNIQUET:  * No tourniquets in log *  DICTATION:  Note in EPIC.   PLAN OF CARE: Discharge to home after PACU  PATIENT DISPOSITION:  PACU - hemodynamically stable.   Delay start of Pharmacological VTE agent (>24hrs) due to surgical blood loss or risk of bleeding: not applicable

## 2016-02-23 ENCOUNTER — Encounter (HOSPITAL_COMMUNITY): Payer: Self-pay | Admitting: Obstetrics and Gynecology

## 2016-02-25 ENCOUNTER — Telehealth: Payer: Self-pay

## 2016-02-25 NOTE — Telephone Encounter (Signed)
-----   Message from Nunzio Cobbs, MD sent at 02/25/2016  8:53 AM EST ----- Please inform patient of her surgical pathology reports showing a benign endometrial polyp and a benign inclusion cyst of her thigh that had inflammation.  I hope she is doing well following her surgery this week.

## 2016-02-25 NOTE — Telephone Encounter (Signed)
Spoke with patient. Advised patient of results as seen below from [place]. Patient verbalizes understanding. Reports she is doing well, but has a very sore throat following surgery. Has been gargling with warm salt water, eating soft/cold foods, and using lozenges for relief. Will continue to do this. Denies any fever, cold symptoms, muscle aches, or chills. Will continue to monitor symptoms.  Routing to provider for final review. Patient agreeable to disposition. Will close encounter.

## 2016-03-01 DIAGNOSIS — M48061 Spinal stenosis, lumbar region without neurogenic claudication: Secondary | ICD-10-CM | POA: Diagnosis not present

## 2016-03-01 DIAGNOSIS — M5416 Radiculopathy, lumbar region: Secondary | ICD-10-CM | POA: Diagnosis not present

## 2016-03-09 ENCOUNTER — Encounter: Payer: Self-pay | Admitting: Obstetrics and Gynecology

## 2016-03-09 ENCOUNTER — Ambulatory Visit (INDEPENDENT_AMBULATORY_CARE_PROVIDER_SITE_OTHER): Payer: PPO | Admitting: Obstetrics and Gynecology

## 2016-03-09 VITALS — BP 120/80 | HR 60 | Resp 12 | Ht 62.0 in | Wt 182.6 lb

## 2016-03-09 DIAGNOSIS — L720 Epidermal cyst: Secondary | ICD-10-CM

## 2016-03-09 DIAGNOSIS — Z9889 Other specified postprocedural states: Secondary | ICD-10-CM

## 2016-03-09 NOTE — Progress Notes (Signed)
GYNECOLOGY  VISIT   HPI: 66 y.o.   Married  Caucasian  female   [phone] with Patient's last menstrual period was 07/18/2011.   here for 2 week follow up [place] (N/A Vagina )BIOPSY SKIN (Right Thigh). Final path showing benign endometrial polyp and benign inclusion cyst of the right medial thigh.   States the skin area of the right thigh excision area filled up with fluid and then drained 2 days ago.   Sees [facility]. Has a follow up appointment for [facility] of right thigh, set for 2 weeks from now.    GYNECOLOGIC HISTORY: Patient's last menstrual period was 07/18/2011. Contraception:  Postmenopausal Menopausal hormone therapy:  none Last mammogram:  11/05/15 BIRADS1, Density B Last pap smear:  12-30-15 Neg:Neg HR HPV; 09-03-14 Neg        OB History    Gravida Para Term Preterm AB Living   3 3 3     3    SAB TAB Ectopic Multiple Live Births           3         Patient Active Problem List   Diagnosis Date Noted  . Hypertriglyceridemia 10/07/2015  . Abnormal nuclear stress test   . Chest pain 07/23/2015  . Palpitations 07/23/2015  . Depression   . Left-sided weakness 05/24/2014  . Hyponatremia 05/24/2014  . Right arm numbness 05/24/2014  . Alcohol abuse 05/24/2014  . Alcohol abuse with intoxication ([place]) 05/24/2014  . Syncope and collapse 05/24/2014  . CVA (cerebral infarction)   . Essential hypertension   . Hyperlipidemia 04/04/2013    Past Medical History:  Diagnosis Date  . Alcohol intoxication ([place]) 05/2014    ED note   . Anxiety   . Arthritis   . Boils   . Chronic back pain   . Community acquired pneumonia 01/05/2016  . Depression   . Fibromyalgia   . GERD (gastroesophageal reflux disease)   . Hepatic cyst 01/06/2016  . Hyperlipidemia   . Hypertension   . Insomnia   . Leg cramps   . Migraines    with aura  . 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 Osteopenia   . PONV (postoperative  nausea and vomiting)    severe  . Postmenopausal bleeding 12/2015  . PTSD (post-traumatic stress disorder)   . S/P epidural steroid injection   . 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 Strep throat 01/05/2016  . Stroke [facility])    3 strokes, no residual    Past Surgical History:  Procedure Laterality Date  . BREAST ENHANCEMENT SURGERY    . CARDIAC CATHETERIZATION N/A 08/12/2015   Procedure: Left Heart Cath and Coronary Angiography;  Surgeon: Jettie Booze, MD;  Location: [place] [facility];  Service: Cardiovascular;  Laterality: N/A;  . COLONOSCOPY    . DILATATION & CURETTAGE/HYSTEROSCOPY WITH MYOSURE N/A 02/22/2016   Procedure: DILATATION & CURETTAGE/HYSTEROSCOPY WITH MYOSURE;  Surgeon: Nunzio Cobbs, MD;  Location: [place] [facility];  Service: Gynecology;  Laterality: N/A;  . FACIAL COSMETIC SURGERY    . HAND SURGERY     nerve & tendon repair  . LUMBAR EPIDURAL INJECTION  06/16/2015  . SKIN BIOPSY Right 02/22/2016   Procedure: BIOPSY SKIN;  Surgeon: Jamey Reas  Lorenz Coaster, MD;  Location: [place] [facility];  Service: Gynecology;  Laterality: Right;  . SQUAMOUS CELL CARCINOMA EXCISION    . WISDOM TOOTH EXTRACTION      Current Outpatient Prescriptions  Medication Sig Dispense Refill  . aspirin 325 MG tablet Take 325 mg by mouth at bedtime.     . cholecalciferol (VITAMIN D) 1000 units tablet Take 5,000 Units by mouth daily.     Marland Kitchen ibuprofen (ADVIL,MOTRIN) 800 MG tablet Take 1 tablet (800 mg total) by mouth every 8 (eight) hours as needed. 30 tablet 0  . MAGNESIUM PO Take 1 tablet by mouth daily.     . Omega-3 Fatty Acids (FISH OIL) 1000 MG CAPS Take 1 capsule (1,000 mg total) by mouth 2 (two) times daily. 60 capsule 0  . QUEtiapine (SEROQUEL) 100 MG tablet Take 200-300 mg by mouth at bedtime.    . ranitidine (ZANTAC) 75 MG tablet Take 75 mg by mouth  daily as needed for heartburn.    . sertraline (ZOLOFT) 100 MG tablet Take 100 mg by mouth at bedtime.     . valsartan (DIOVAN) 320 MG tablet Take 320 mg by mouth at bedtime.     No current facility-administered medications for this visit.      ALLERGIES: Codeine; Crestor [rosuvastatin]; Other; and Simvastatin  Family History  Problem Relation Age of Onset  . Stroke Father   . Depression Father   . Stroke Mother   . Hypertension Brother   . Heart disease Brother     stint  . Heart attack Brother   . Heart attack Maternal Uncle   . Heart attack Maternal Grandmother   . Heart attack Brother   . Heart attack Maternal Uncle   . Heart attack Maternal Uncle   . Heart attack Maternal Uncle   . Heart attack Maternal Uncle     Social History   Social History  . Marital status: Married    Spouse name: N/A  . Number of children: N/A  . Years of education: N/A   Occupational History  . Not on file.   Social History Main Topics  . Smoking status: Former Research scientist (life sciences)  . Smokeless tobacco: Never Used  . Alcohol use 2.4 oz/week    4 Glasses of wine per week  . Drug use: No  . Sexual activity: Yes    Partners: Male    Birth control/ protection: Post-menopausal   Other Topics Concern  . Not on file   Social History Narrative  . No narrative on file    ROS:  Pertinent items are noted in HPI.  PHYSICAL EXAMINATION:    BP 120/80 (BP Location: Right Arm, Patient Position: Sitting, Cuff Size: Normal)   Pulse 60   Resp 12   Ht 5\' 2"  (1.575 m)   Wt 182 lb 9.6 oz (82.8 kg)   LMP 07/18/2011   BMI 33.40 kg/m     General appearance: alert, cooperative and appears stated age   Pelvic: External genitalia:  no lesions              Urethra:  normal appearing urethra with no masses, tenderness or lesions              Bartholins and Skenes: normal                 Vagina: normal appearing vagina with normal color and discharge, no lesions              Cervix: no lesions  Bimanual Exam:  Uterus:  normal size, contour, position, consistency, mobility, non-tender              Adnexa: no mass, fullness, tenderness           Right medial thigh -  Incision with partial closure of wound.  Looks like some sutures have come out.   Wound cx taken.   Chaperone was present for exam.  ASSESSMENT   Status post hysteroscopy with Myosure resection of polyp, dilation and curettage, excision of right thigh lesion. Right thigh inclusion cyst - draining.  Chronic.  PLAN  Wound culture performed.  Follow up for annual exam, for any postmenopausal bleeding, and prn.  See dermatology in follow up to [facility] and check of right thigh area which is persistent.    An After Visit Summary was printed and given to the patient.  __10____ minutes face to face time of which over 50% was spent in counseling.

## 2016-03-13 LAB — WOUND CULTURE
Gram Stain: NONE SEEN
Gram Stain: NONE SEEN

## 2016-03-14 ENCOUNTER — Telehealth: Payer: Self-pay | Admitting: *Deleted

## 2016-03-14 NOTE — Telephone Encounter (Signed)
-----   Message from Nunzio Cobbs, MD sent at 03/14/2016 10:37 AM EST ----- Please share results of wound culture showing group B strep again.  I am recommending Ampicillin 500 mg po tid for 7 days.  She can also take Diflucan 150 mg po x 1 for yeast infection if this occurs.  Can repeat in 72 hours if needed. Please contact her dermatologist's office and let him know that I would like him to look at her wound area on her right medial thigh/vulva where I removed the inclusion cyst in this area that was inflamed and would not heal. Patient has an upcoming appointment with the dermatologist for her skin recheck of the excision site of her squamous cell carcinoma of her right mid thigh.   Cc- Marisa Sprinkles

## 2016-03-14 NOTE — Telephone Encounter (Signed)
Left message to call Salimata Christenson at [phone].  

## 2016-03-16 MED ORDER — AMPICILLIN 500 MG PO CAPS: 500.0000 mg | ORAL_CAPSULE | Freq: Three times a day (TID) | ORAL | 0 refills | Status: AC

## 2016-03-16 MED ORDER — FLUCONAZOLE 150 MG PO TABS: ORAL_TABLET | ORAL | 0 refills | Status: DC

## 2016-03-16 NOTE — Telephone Encounter (Signed)
Spoke with Julious Oka at [facility], advised Dr. Quincy Simmonds would like Dr. Michele Mcalpine to look at wound area on patients right medial thigh/vulva where an inclusion cyst was removed in this area that was inflamed and would not heal. Patient has skin recheck previously scheduled for 04/12/16. Was advised by [facility] for patient to keep current appointment, will update Dr. Michele Mcalpine. Was advised to send copy of culture results and OV notes. Verified fax # [phone].  OV notes dated 03/09/16 and would culture results faxed to [phone], ATTN: [place].  Spoke with patient, advised to keep current appointment with Dr. Michele Mcalpine, no changes. Patient verbalizes understanding and is agreeable.  Routing to provider for final review. Patient is agreeable to disposition. Will close encounter.

## 2016-03-16 NOTE — Telephone Encounter (Signed)
Spoke with patient, advised of results and recommendations as seen below per Dr. Quincy Simmonds. Rx for ampicillin and diflucan sent to verified pharmacy on file. Patient states she sees Dr. Michele Mcalpine at [facility] [place]. Patient states she has appointment scheduled for 04/12/16. RN asked patient if appointment needs to be moved to be evaluated sooner, any scheduling restrictions? Patient states she prefers Wednesdays, no Thursdays or Fridays. Advised patient will call Dr. Erskin Burnet office and return call with update. Patient is agreeable.  [facility]. Office -[phone] Fax  [phone]

## 2016-03-16 NOTE — Telephone Encounter (Signed)
Left message to call Vicki [name] at [phone].  

## 2016-04-13 DIAGNOSIS — Z23 Encounter for immunization: Secondary | ICD-10-CM | POA: Diagnosis not present

## 2016-04-13 DIAGNOSIS — R635 Abnormal weight gain: Secondary | ICD-10-CM | POA: Diagnosis not present

## 2016-04-13 DIAGNOSIS — I10 Essential (primary) hypertension: Secondary | ICD-10-CM | POA: Diagnosis not present

## 2016-04-13 DIAGNOSIS — F419 Anxiety disorder, unspecified: Secondary | ICD-10-CM | POA: Diagnosis not present

## 2016-04-13 DIAGNOSIS — R7309 Other abnormal glucose: Secondary | ICD-10-CM | POA: Diagnosis not present

## 2016-04-13 DIAGNOSIS — E785 Hyperlipidemia, unspecified: Secondary | ICD-10-CM | POA: Diagnosis not present

## 2016-08-09 DIAGNOSIS — M5416 Radiculopathy, lumbar region: Secondary | ICD-10-CM | POA: Diagnosis not present

## 2016-08-09 DIAGNOSIS — Z6829 Body mass index (BMI) 29.0-29.9, adult: Secondary | ICD-10-CM | POA: Diagnosis not present

## 2016-08-09 DIAGNOSIS — M48061 Spinal stenosis, lumbar region without neurogenic claudication: Secondary | ICD-10-CM | POA: Diagnosis not present

## 2016-08-31 DIAGNOSIS — F4310 Post-traumatic stress disorder, unspecified: Secondary | ICD-10-CM | POA: Diagnosis not present

## 2016-09-14 ENCOUNTER — Ambulatory Visit (INDEPENDENT_AMBULATORY_CARE_PROVIDER_SITE_OTHER): Payer: PPO | Admitting: Certified Nurse Midwife

## 2016-09-14 ENCOUNTER — Encounter: Payer: Self-pay | Admitting: Certified Nurse Midwife

## 2016-09-14 VITALS — BP 118/68 | HR 72 | Resp 16 | Ht 61.75 in | Wt 193.0 lb

## 2016-09-14 DIAGNOSIS — F1011 Alcohol abuse, in remission: Secondary | ICD-10-CM

## 2016-09-14 DIAGNOSIS — N951 Menopausal and female climacteric states: Secondary | ICD-10-CM

## 2016-09-14 DIAGNOSIS — Z23 Encounter for immunization: Secondary | ICD-10-CM | POA: Diagnosis not present

## 2016-09-14 DIAGNOSIS — Z87898 Personal history of other specified conditions: Secondary | ICD-10-CM | POA: Diagnosis not present

## 2016-09-14 DIAGNOSIS — Z01419 Encounter for gynecological examination (general) (routine) without abnormal findings: Secondary | ICD-10-CM | POA: Diagnosis not present

## 2016-09-14 NOTE — Patient Instructions (Signed)

## 2016-09-14 NOTE — Progress Notes (Signed)
66 y.o. G4P3003 Married  Caucasian Fe here for annual exam. Menopausal no HRT. Denies vaginal bleeding or vaginal dryness. Having some urinary leaking with sneeze or cough. Feels area that she had skin biopsy has opened again. Please check. Sees PCP Dr. Tamala Julian yearly for labs and hypertension and lipid management. Has been free of alcohol use for the past year. Not smoking anymore!! Took trip to [place] and [place] and enjoyed the change. Recent loss of brother to suicide, emotionally doing OK. No other health issues today.    Patient's last menstrual period was 07/18/2011.          Sexually active: Yes.    The current method of family planning is post menopausal status.    Exercising: No.  exercise Smoker:  no  Health Maintenance: Pap:  09-03-14 neg History of Abnormal Pap: no MMG:  11-05-15 category b density birads 1:neg Self Breast exams: yes Colonoscopy:  none BMD:   2016 osteopenia TDaP:  unsure Shingles: no Pneumonia: unsure Hep C and HIV: Hep c neg 2018, HIV 2017 neg Labs:    reports that she has quit smoking. She has never used smokeless tobacco. She reports that she drinks about 2.4 oz of alcohol per week . She reports that she does not use drugs.  Past Medical History:  Diagnosis Date  . Alcohol intoxication ([place]) 05/2014    ED note   . Anxiety   . Arthritis   . Boils   . Chronic back pain   . Community acquired pneumonia 01/05/2016  . Depression   . Fibromyalgia   . GERD (gastroesophageal reflux disease)   . Hepatic cyst 01/06/2016  . Hyperlipidemia   . Hypertension   . Insomnia   . Leg cramps   . Migraines    with aura  . 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 Osteopenia   . PONV (postoperative nausea and vomiting)    severe  . Postmenopausal bleeding 12/2015  . PTSD (post-traumatic stress disorder)   . S/P epidural steroid injection   . 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 Strep throat 01/05/2016  . Stroke [facility])    3 strokes, no residual    Past Surgical History:  Procedure Laterality Date  . BREAST ENHANCEMENT SURGERY    . CARDIAC CATHETERIZATION N/A 08/12/2015   Procedure: Left Heart Cath and Coronary Angiography;  Surgeon: Jettie Booze, MD;  Location: [place] [facility];  Service: Cardiovascular;  Laterality: N/A;  . COLONOSCOPY    . DILATATION & CURETTAGE/HYSTEROSCOPY WITH MYOSURE N/A 02/22/2016   Procedure: DILATATION & CURETTAGE/HYSTEROSCOPY WITH MYOSURE;  Surgeon: Nunzio Cobbs, MD;  Location: [place] [facility];  Service: Gynecology;  Laterality: N/A;  . FACIAL COSMETIC SURGERY    . HAND SURGERY     nerve & tendon repair  . LUMBAR EPIDURAL INJECTION  06/16/2015  . SKIN BIOPSY Right 02/22/2016   Procedure: BIOPSY SKIN;  Surgeon: Nunzio Cobbs, MD;  Location: [place] [facility];  Service: Gynecology;  Laterality: Right;  . SQUAMOUS CELL CARCINOMA EXCISION    . WISDOM TOOTH EXTRACTION      Current Outpatient Prescriptions  Medication Sig Dispense Refill  . aspirin 325 MG tablet Take 325 mg by mouth at bedtime.     Marland Kitchen  cholecalciferol (VITAMIN D) 1000 units tablet Take 5,000 Units by mouth daily.     . fluconazole (DIFLUCAN) 150 MG tablet Take one tablet by mouth for yeast infection, may repeat in 72 hours if symptoms still present 2 tablet 0  . ibuprofen (ADVIL,MOTRIN) 800 MG tablet Take 1 tablet (800 mg total) by mouth every 8 (eight) hours as needed. 30 tablet 0  . MAGNESIUM PO Take 1 tablet by mouth daily.     . Omega-3 Fatty Acids (FISH OIL) 1000 MG CAPS Take 1 capsule (1,000 mg total) by mouth 2 (two) times daily. 60 capsule 0  . QUEtiapine (SEROQUEL) 100 MG tablet Take 200-300 mg by mouth at bedtime.    . ranitidine (ZANTAC) 75 MG tablet Take 75 mg by mouth daily as needed for heartburn.    . sertraline (ZOLOFT) 100 MG tablet Take 100 mg by mouth at bedtime.     .  valsartan (DIOVAN) 320 MG tablet Take 320 mg by mouth at bedtime.     No current facility-administered medications for this visit.     Family History  Problem Relation Age of Onset  . Stroke Father   . Depression Father   . Stroke Mother   . Hypertension Brother   . Heart disease Brother        stint  . Heart attack Brother   . Heart attack Maternal Uncle   . Heart attack Maternal Grandmother   . Heart attack Brother   . Heart attack Maternal Uncle   . Heart attack Maternal Uncle   . Heart attack Maternal Uncle   . Heart attack Maternal Uncle     ROS:  Pertinent items are noted in HPI.  Otherwise, a comprehensive ROS was negative.  Exam:   LMP 07/18/2011    Ht Readings from Last 3 Encounters:  03/09/16 5\' 2"  (1.575 m)  02/22/16 5\' 3"  (1.6 m)  02/19/16 5\' 3"  (1.6 m)    General appearance: alert, cooperative and appears stated age Head: Normocephalic, without obvious abnormality, atraumatic Neck: no adenopathy, supple, symmetrical, trachea midline and thyroid normal to inspection and palpation Lungs: clear to auscultation bilaterally Breasts: normal appearance, no masses or tenderness, No nipple retraction or dimpling, No nipple discharge or bleeding, No axillary or supraclavicular adenopathy Heart: regular rate and rhythm Abdomen: soft, non-tender; no masses,  no organomegaly Extremities: extremities normal, atraumatic, no cyanosis or edema Skin: Skin color, texture, turgor normal. No rashes or lesions Lymph nodes: Cervical, supraclavicular, and axillary nodes normal. No abnormal inguinal nodes palpated Neurologic: Grossly normal   Pelvic: External genitalia:  no lesions              Urethra:  normal appearing urethra with no masses, tenderness or lesions              Bartholin's and Skene's: normal                 Vagina: normal appearing vagina with normal color and discharge, no lesions              Cervix: multiparous appearance, no cervical motion tenderness and  no lesions              Pap taken: No. Bimanual Exam:  Uterus:  normal size, contour, position, consistency, mobility, non-tender              Adnexa: normal adnexa and no mass, fullness, tenderness  Rectovaginal: Confirms               Anus:  normal sphincter tone, no lesions  Chaperone present: yes  A:  Well Woman with normal exam  Menopausal no HRT  Vaginal dryness  Hypertension with MD management  History of alcohol abuse, now in recovery  Social stress with brother's death  Immunization update  P:   Reviewed health and wellness pertinent to exam  Aware of need to evaluate if vaginal bleeding  Discussed coconut oil use, instructions given.  Continue follow up with PCP as idicated  Seek family and friend support as needed.  Request TDAP  Pap smear: no   counseled on breast self exam, mammography screening, adequate intake of calcium and vitamin D, diet and exercise, Kegel's exercises  return annually or prn  An After Visit Summary was printed and given to the patient.

## 2016-10-26 DIAGNOSIS — F4310 Post-traumatic stress disorder, unspecified: Secondary | ICD-10-CM | POA: Diagnosis not present

## 2016-11-23 ENCOUNTER — Encounter: Payer: Self-pay | Admitting: Interventional Cardiology

## 2016-11-23 DIAGNOSIS — R739 Hyperglycemia, unspecified: Secondary | ICD-10-CM | POA: Diagnosis not present

## 2016-11-23 DIAGNOSIS — R9431 Abnormal electrocardiogram [ECG] [EKG]: Secondary | ICD-10-CM | POA: Diagnosis not present

## 2016-11-23 DIAGNOSIS — E785 Hyperlipidemia, unspecified: Secondary | ICD-10-CM | POA: Diagnosis not present

## 2016-11-23 DIAGNOSIS — Z0000 Encounter for general adult medical examination without abnormal findings: Secondary | ICD-10-CM | POA: Diagnosis not present

## 2016-11-23 DIAGNOSIS — Z1389 Encounter for screening for other disorder: Secondary | ICD-10-CM | POA: Diagnosis not present

## 2016-11-23 DIAGNOSIS — G4700 Insomnia, unspecified: Secondary | ICD-10-CM | POA: Diagnosis not present

## 2016-11-23 DIAGNOSIS — I10 Essential (primary) hypertension: Secondary | ICD-10-CM | POA: Diagnosis not present

## 2016-11-23 DIAGNOSIS — F419 Anxiety disorder, unspecified: Secondary | ICD-10-CM | POA: Diagnosis not present

## 2016-11-23 DIAGNOSIS — R0789 Other chest pain: Secondary | ICD-10-CM | POA: Diagnosis not present

## 2016-12-01 ENCOUNTER — Ambulatory Visit: Payer: PPO | Admitting: Interventional Cardiology

## 2016-12-01 ENCOUNTER — Encounter: Payer: Self-pay | Admitting: Interventional Cardiology

## 2016-12-01 VITALS — BP 120/68 | HR 103 | Ht 61.0 in | Wt 201.0 lb

## 2016-12-01 DIAGNOSIS — I10 Essential (primary) hypertension: Secondary | ICD-10-CM | POA: Diagnosis not present

## 2016-12-01 DIAGNOSIS — E782 Mixed hyperlipidemia: Secondary | ICD-10-CM | POA: Diagnosis not present

## 2016-12-01 DIAGNOSIS — R072 Precordial pain: Secondary | ICD-10-CM | POA: Diagnosis not present

## 2016-12-01 NOTE — Patient Instructions (Signed)
Medication Instructions:  Your physician recommends that you continue on your current medications as directed. Please refer to the Current Medication list given to you today.   Labwork: None ordered  Testing/Procedures: None ordered  Follow-Up: Your physician wants you to follow-up AS NEEDED  Any Other Special Instructions Will Be Listed Below (If Applicable).     If you need a refill on your cardiac medications before your next appointment, please call your pharmacy.   

## 2016-12-01 NOTE — Progress Notes (Signed)
Cardiology Office Note   Date:  12/01/2016   ID:  Vicki [name], DOB [birth year], MRN [id number]  PCP:  Vicki Ada, MD    No chief complaint on file. chest pain   Wt Readings from Last 3 Encounters:  12/01/16 201 lb (91.2 kg)  09/14/16 193 lb (87.5 kg)  03/09/16 182 lb 9.6 oz (82.8 kg)       History of Present Illness: Vicki [name] is a 66 y.o. female  who had chest pain and an abnormal stress test.  SHe had  a cath with nonobstructive CAD in 2017.  She was diagnosed with noncardiac chest pain and told to f/u as needed. Meidcal management for increased TG also was endorsed.   She had some sharp chest pain which prompted a visit to her PMD.  ECG done showed some inferior Q waves.Marland Kitchen  Her walking is limited by back pain.  She sees Vicki neurosurgeon.      Past Medical History:  Diagnosis Date  . Alcohol intoxication (Vicki [name]) 05/2014    ED note   . Anxiety   . Arthritis   . Boils   . Chronic back pain   . Community acquired pneumonia 01/05/2016  . Depression   . Fibromyalgia   . GERD (gastroesophageal reflux disease)   . Hepatic cyst 01/06/2016  . Hyperlipidemia   . Hypertension   . Insomnia   . Leg cramps   . Migraines    with aura  . 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 Osteopenia   . PONV (postoperative nausea and vomiting)    severe  . Postmenopausal bleeding 12/2015  . PTSD (post-traumatic stress disorder)   . S/P epidural steroid injection   . 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 Spinal stenosis   . 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 Strep throat 01/05/2016  . Stroke Vicki [name])    3 strokes, no residual    Past Surgical History:  Procedure Laterality Date  . BREAST ENHANCEMENT SURGERY    . CARDIAC CATHETERIZATION N/A 08/12/2015   Procedure: Left Heart Cath and Coronary Angiography;  Surgeon: Vicki Booze, MD;   Location: [facility];  Service: Cardiovascular;  Laterality: N/A;  . COLONOSCOPY    . DILATATION & CURETTAGE/HYSTEROSCOPY WITH MYOSURE N/A 02/22/2016   Procedure: DILATATION & CURETTAGE/HYSTEROSCOPY WITH MYOSURE;  Surgeon: Nunzio Cobbs, MD;  Location: [facility];  Service: Gynecology;  Laterality: N/A;  . FACIAL COSMETIC SURGERY    . HAND SURGERY     nerve & tendon repair  . LUMBAR EPIDURAL INJECTION  06/16/2015  . SKIN BIOPSY Right 02/22/2016   Procedure: BIOPSY SKIN;  Surgeon: Nunzio Cobbs, MD;  Location: [facility];  Service: Gynecology;  Laterality: Right;  . SQUAMOUS CELL CARCINOMA EXCISION    . WISDOM TOOTH EXTRACTION       Current Outpatient Medications  Medication Sig Dispense Refill  . aspirin 325 MG tablet Take 325 mg by mouth at bedtime.     Marland Kitchen ibuprofen (ADVIL,MOTRIN) 800 MG tablet Take 1 tablet (800 mg total) by mouth every 8 (eight) hours as needed. 30 tablet 0  . MAGNESIUM PO Take 1 tablet by mouth daily.     . Omega-3 Fatty Acids (FISH OIL) 1000 MG CAPS Take 1 capsule (  1,000 mg total) by mouth 2 (two) times daily. 60 capsule 0  . QUEtiapine (SEROQUEL) 400 MG tablet Take 400 mg at bedtime by mouth.     . sertraline (ZOLOFT) 100 MG tablet Take 200 mg at bedtime by mouth.     . valsartan (DIOVAN) 320 MG tablet Take 320 mg by mouth at bedtime.     No current facility-administered medications for this visit.     Allergies:   Codeine; Crestor [rosuvastatin]; Other; and Simvastatin    Social History:  Vicki patient  reports that she has quit smoking. she has never used smokeless tobacco. She reports that she does not drink alcohol or use drugs.   Family History:  Vicki patient's family history includes CAD in her brother and brother; Cholesteatoma in her sister; Depression in her father; Heart attack in her brother, brother, maternal grandmother, maternal uncle, maternal uncle, maternal uncle, maternal uncle, and maternal uncle; Heart disease in her brother;  Hyperlipidemia in her sister; Hypertension in her brother; Non-Hodgkin's lymphoma in her brother; Other in her brother; Stroke in her father and mother.    ROS:  Please see Vicki history of present illness.   Otherwise, review of systems are positive for occasional leg edema.   All other systems are reviewed and negative.    PHYSICAL EXAM: VS:  BP 120/68   Pulse (!) 103   Ht 5\' 1"  (1.549 m)   Wt 201 lb (91.2 kg)   LMP 07/18/2011   SpO2 96%   BMI 37.98 kg/m  , BMI Body mass index is 37.98 kg/m. GEN: Well nourished, well developed, in no acute distress  HEENT: normal  Neck: no JVD, carotid bruits, or masses Cardiac: RRR; no murmurs, rubs, or gallops,no edema ; mild tenderness to palpation of her sternum Respiratory:  clear to auscultation bilaterally, normal work of breathing GI: soft, nontender, nondistended, + BS MS: no deformity or atrophy  Skin: warm and dry, no rash Neuro:  Strength and sensation are intact Psych: euthymic mood, full affect   EKG:   Vicki ekg ordered 11/23/16 demonstrates NSR, small inferior Q waves, no ST changes; no changes compared to prior ECG.   Recent Labs: 02/19/2016: BUN 20; Creatinine, Ser 0.78; Hemoglobin 15.2; Platelets 264; Potassium 4.9; Sodium 133   Lipid Panel    Component Value Date/Time   CHOL 235 (H) 05/25/2014 0535   TRIG 352 (H) 05/25/2014 0535   HDL 38 (L) 05/25/2014 0535   CHOLHDL 6.2 05/25/2014 0535   VLDL 70 (H) 05/25/2014 0535   LDLCALC 127 (H) 05/25/2014 0535     Other studies Reviewed: Additional studies/ records that were reviewed today with results demonstrating: cath results reviewed. Lipids reviewed from 2016.LDL 225 in 2018    ASSESSMENT AND PLAN:  1. Chest pain: Several atypical features.  2017 cardiac cath without significant disease.  Has some chronic back issues.  We will continue higher dose aspirin. 2. Hyperlipidemia: Continue efforts to try to get triglycerides/LDL down.  Significantly increased.  Need to  consider statin or zetia if intolerant to statin.  WIll defer to PCP 3. Hypertension: Blood pressure well controlled.  Continue current medications.  She has gained weight.  She will try to reverse this trend.    Current medicines are reviewed at length with Vicki patient today.  Vicki patient concerns regarding her medicines were addressed.  Vicki following changes have been made:  No change  Labs/ tests ordered today include:  No orders of Vicki defined types were placed  in this encounter.   Recommend 150 minutes/week of aerobic exercise Low fat, low carb, high fiber diet recommended  Disposition:   FU as needed   Signed, Larae Grooms, MD  12/01/2016 3:26 PM    [facility] [place], [place], [place]  [address] Phone: [phone]; Fax: [phone]

## 2016-12-28 DIAGNOSIS — Z1231 Encounter for screening mammogram for malignant neoplasm of breast: Secondary | ICD-10-CM | POA: Diagnosis not present

## 2017-01-18 DIAGNOSIS — F4310 Post-traumatic stress disorder, unspecified: Secondary | ICD-10-CM | POA: Diagnosis not present

## 2017-01-18 DIAGNOSIS — E785 Hyperlipidemia, unspecified: Secondary | ICD-10-CM | POA: Diagnosis not present

## 2017-02-06 ENCOUNTER — Encounter: Payer: Self-pay | Admitting: Certified Nurse Midwife

## 2017-03-22 DIAGNOSIS — E785 Hyperlipidemia, unspecified: Secondary | ICD-10-CM | POA: Diagnosis not present

## 2017-04-19 DIAGNOSIS — M48061 Spinal stenosis, lumbar region without neurogenic claudication: Secondary | ICD-10-CM | POA: Diagnosis not present

## 2017-04-19 DIAGNOSIS — M5416 Radiculopathy, lumbar region: Secondary | ICD-10-CM | POA: Diagnosis not present

## 2017-05-10 DIAGNOSIS — F4310 Post-traumatic stress disorder, unspecified: Secondary | ICD-10-CM | POA: Diagnosis not present

## 2017-05-24 DIAGNOSIS — E785 Hyperlipidemia, unspecified: Secondary | ICD-10-CM | POA: Diagnosis not present

## 2017-09-20 ENCOUNTER — Other Ambulatory Visit (HOSPITAL_COMMUNITY)
Admission: RE | Admit: 2017-09-20 | Discharge: 2017-09-20 | Disposition: A | Discharge status: Home / Self Care | Payer: PPO | Source: Ambulatory Visit | Attending: Certified Nurse Midwife | Admitting: Certified Nurse Midwife

## 2017-09-20 ENCOUNTER — Ambulatory Visit (INDEPENDENT_AMBULATORY_CARE_PROVIDER_SITE_OTHER): Payer: PPO | Admitting: Certified Nurse Midwife

## 2017-09-20 ENCOUNTER — Other Ambulatory Visit: Payer: Self-pay

## 2017-09-20 ENCOUNTER — Encounter: Payer: Self-pay | Admitting: Certified Nurse Midwife

## 2017-09-20 VITALS — BP 120/76 | HR 70 | Resp 16 | Ht 62.25 in | Wt 197.0 lb

## 2017-09-20 DIAGNOSIS — Z8673 Personal history of transient ischemic attack (TIA), and cerebral infarction without residual deficits: Secondary | ICD-10-CM

## 2017-09-20 DIAGNOSIS — Z01419 Encounter for gynecological examination (general) (routine) without abnormal findings: Secondary | ICD-10-CM

## 2017-09-20 DIAGNOSIS — Z124 Encounter for screening for malignant neoplasm of cervix: Secondary | ICD-10-CM | POA: Diagnosis not present

## 2017-09-20 DIAGNOSIS — E663 Overweight: Secondary | ICD-10-CM | POA: Diagnosis not present

## 2017-09-20 DIAGNOSIS — Z8659 Personal history of other mental and behavioral disorders: Secondary | ICD-10-CM

## 2017-09-20 DIAGNOSIS — N951 Menopausal and female climacteric states: Secondary | ICD-10-CM | POA: Diagnosis not present

## 2017-09-20 NOTE — Progress Notes (Signed)
67 y.o. [id number] Divorced  Caucasian Fe here for annual exam. Menopausal denies vaginal bleeding or vaginal dryness issues. Area of epidermal inclusion cyst removed still feels firm. Still has sebaceous cysts, that come and go in perineal area. Has stopped all personal products in that area and this seems to help. Sees Dr. Tamala Julian once yearly and labs. Patient has had some dizziness with hypertension. Feels that BP medication is working better since this was changed from Losartan.Not smoking!. Has stopped all alcohol use? Saw cardiology and had nuclear stress testing, [place] and no change per patient of heart status. She continues to see cardiology as needed.No other health concerns today.. Retired now to care for herself.  Patient's last menstrual period was 07/18/2011.          Sexually active: No.  The current method of family planning is post menopausal status.    Exercising: no exercise   Smoker:  no  Review of Systems  Constitutional:       Weight gain  HENT:       Headache, sinusitis  Cardiovascular: Positive for chest pain and palpitations.  Gastrointestinal: Positive for nausea and vomiting.  Genitourinary:       Vaginal lumps, itching  Musculoskeletal:       Muscle weakness,muscle/joint pain  Endo/Heme/Allergies:       Excessive thirst, heat/cold intolerance, swollen glands  Psychiatric/Behavioral: Positive for depression.    Health Maintenance: Pap:  09-03-14 neg, 12-30-15 neg HPV HR neg History of Abnormal Pap: no MMG:  12-28-16 category a density birads 1:neg Self Breast exams: yes Colonoscopy:  2018  BMD:   2018 normal TDaP:  2018 Shingles: not done Pneumonia: had done Hep C and HIV: both neg 2018 Labs:with PCP   reports that she has quit smoking. She has never used smokeless tobacco. She reports that she does not drink alcohol or use drugs.  Past Medical History:  Diagnosis Date  . Alcohol intoxication ([place]) 05/2014    ED note   . Anxiety   . Arthritis   . Boils    . Chronic back pain   . Community acquired pneumonia 01/05/2016  . Depression   . Fibromyalgia   . GERD (gastroesophageal reflux disease)   . Heart attack ([place])   . Hepatic cyst 01/06/2016  . Hyperlipidemia   . Hypertension   . Insomnia   . Leg cramps   . Migraines    with aura  . Mini stroke ([place]) 05/2014   with paralysis for 1 hr  . Osteopenia   . PONV (postoperative nausea and vomiting)    severe  . Postmenopausal bleeding 12/2015  . PTSD (post-traumatic stress disorder)   . S/P epidural steroid injection   . 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 Spinal stenosis   . 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 Strep throat 01/05/2016  . Stroke [facility])    3 strokes, no residual    Past Surgical History:  Procedure Laterality Date  . BREAST ENHANCEMENT SURGERY    . CARDIAC CATHETERIZATION N/A 08/12/2015   Procedure: Left Heart Cath and Coronary Angiography;  Surgeon: Jettie Booze, MD;  Location: [facility];  Service: Cardiovascular;  Laterality: N/A;  . COLONOSCOPY    . DILATATION & CURETTAGE/HYSTEROSCOPY WITH MYOSURE N/A 02/22/2016   Procedure: DILATATION & CURETTAGE/HYSTEROSCOPY WITH MYOSURE;  Surgeon: Everardo All  Yisroel Ramming, MD;  Location: [facility];  Service: Gynecology;  Laterality: N/A;  . FACIAL COSMETIC SURGERY    . HAND SURGERY     nerve & tendon repair  . LUMBAR EPIDURAL INJECTION  06/16/2015  . SKIN BIOPSY Right 02/22/2016   Procedure: BIOPSY SKIN;  Surgeon: Nunzio Cobbs, MD;  Location: [facility];  Service: Gynecology;  Laterality: Right;  . SQUAMOUS CELL CARCINOMA EXCISION    . WISDOM TOOTH EXTRACTION      Current Outpatient Medications  Medication Sig Dispense Refill  . aspirin 325 MG tablet Take 325 mg by mouth at bedtime.     Marland Kitchen ibuprofen (ADVIL,MOTRIN) 800 MG tablet Take 1 tablet (800 mg total) by mouth every 8 (eight) hours as needed.  30 tablet 0  . MAGNESIUM PO Take 1 tablet by mouth daily.     . Omega-3 Fatty Acids (FISH OIL) 1000 MG CAPS Take 1 capsule (1,000 mg total) by mouth 2 (two) times daily. 60 capsule 0  . telmisartan (MICARDIS) 40 MG tablet TAKE 1 TABLET BY MOUTH ONCE DAILY FOR 90 DAYS  0   No current facility-administered medications for this visit.     Family History  Problem Relation Age of Onset  . Stroke Father   . Depression Father   . Stroke Mother   . Hypertension Brother   . Heart disease Brother        stint...PACER  . Heart attack Brother   . CAD Brother   . Heart attack Maternal Uncle   . Heart attack Maternal Grandmother   . Heart attack Brother   . Non-Hodgkin's lymphoma Brother   . CAD Brother   . Heart attack Maternal Uncle   . Heart attack Maternal Uncle   . Heart attack Maternal Uncle   . Heart attack Maternal Uncle   . Cholesteatoma Sister   . Hyperlipidemia Sister   . Other Brother        SUICIDE 05/2016    ROS:  Pertinent items are noted in HPI.  Otherwise, a comprehensive ROS was negative.  Exam:   BP 120/76   Pulse 70   Resp 16   Ht 5' 2.25" (1.581 m)   Wt 197 lb (89.4 kg)   LMP 07/18/2011   BMI 35.74 kg/m  Height: 5' 2.25" (158.1 cm) Ht Readings from Last 3 Encounters:  09/20/17 5' 2.25" (1.581 m)  12/01/16 5\' 1"  (1.549 m)  09/14/16 5' 1.75" (1.568 m)    General appearance: alert, cooperative and appears stated age Head: Normocephalic, without obvious abnormality, atraumatic Neck: no adenopathy, supple, symmetrical, trachea midline and thyroid normal to inspection and palpation Lungs: clear to auscultation bilaterally Breasts: normal appearance, no masses or tenderness, No nipple retraction or dimpling, No nipple discharge or bleeding, No axillary or supraclavicular adenopathy Heart: regular rate and rhythm Abdomen: soft, non-tender; no masses,  no organomegaly Extremities: extremities normal, atraumatic, no cyanosis or edema Skin: Skin color, texture,  turgor normal. No rashes or lesions Lymph nodes: Cervical, supraclavicular, and axillary nodes normal. No abnormal inguinal nodes palpated Neurologic: Grossly normal   Pelvic: External genitalia:  no lesions, area of inclusion cyst removal, healed, but slightly firm, no redness or tenderness noted              Urethra:  normal appearing urethra with no masses, tenderness or lesions              Bartholin's and Skene's: normal  Vagina: normal appearing vagina with normal color and discharge, no lesions              Cervix: no cervical motion tenderness and no lesions              Pap taken: Yes.   Bimanual Exam:  Uterus:  normal size, contour, position, consistency, mobility, non-tender and anteverted              Adnexa: normal adnexa and no mass, fullness, tenderness               Rectovaginal: Confirms with sebaceous cyst noted healing, no redness               Anus:  normal sphincter tone, no lesions  Chaperone present: yes  A:  Well Woman with normal   Menopausal no HRT  Inclusion cyst in vulva area healed well  Sebaceous cyst of perineal area  History of hypertension,stroke, heart attack, cholesterol, anxiety, PTSD with MD management  Recovered alcoholic    P:   Reviewed health and wellness pertinent to exam  Aware of need to advise if vaginal bleeding, continue Olive oil for vaginal dryness  Discussed finding of inclusion cyst scar and recommend massage to area to soften scar.  Discussed epsom salt sitz bath twice weekly for prevention.  Stressed taking care of herself and keeping visits with MD regarding other health problems to prevent further issues.  Pap smear: yes   counseled on breast self exam, mammography screening, feminine hygiene, adequate intake of calcium and vitamin D, diet and exercise  return annually or prn  An After Visit Summary was printed and given to the patient.

## 2017-09-20 NOTE — Patient Instructions (Signed)

## 2017-09-22 LAB — CYTOLOGY - PAP
DIAGNOSIS: NEGATIVE
HPV (WINDOPATH): NOT DETECTED

## 2017-09-27 DIAGNOSIS — F4310 Post-traumatic stress disorder, unspecified: Secondary | ICD-10-CM | POA: Diagnosis not present

## 2017-10-06 DIAGNOSIS — I10 Essential (primary) hypertension: Secondary | ICD-10-CM | POA: Diagnosis not present

## 2017-10-06 DIAGNOSIS — M48061 Spinal stenosis, lumbar region without neurogenic claudication: Secondary | ICD-10-CM | POA: Diagnosis not present

## 2017-10-06 DIAGNOSIS — M5416 Radiculopathy, lumbar region: Secondary | ICD-10-CM | POA: Diagnosis not present

## 2017-11-10 ENCOUNTER — Encounter: Payer: Self-pay | Admitting: Emergency Medicine

## 2017-11-10 DIAGNOSIS — F4310 Post-traumatic stress disorder, unspecified: Secondary | ICD-10-CM | POA: Insufficient documentation

## 2017-11-22 ENCOUNTER — Encounter: Payer: Self-pay | Admitting: Psychiatry

## 2017-11-22 ENCOUNTER — Ambulatory Visit: Payer: PPO | Admitting: Psychiatry

## 2017-11-22 DIAGNOSIS — F331 Major depressive disorder, recurrent, moderate: Secondary | ICD-10-CM

## 2017-11-22 DIAGNOSIS — F99 Mental disorder, not otherwise specified: Secondary | ICD-10-CM | POA: Diagnosis not present

## 2017-11-22 DIAGNOSIS — R7989 Other specified abnormal findings of blood chemistry: Secondary | ICD-10-CM | POA: Diagnosis not present

## 2017-11-22 DIAGNOSIS — F4310 Post-traumatic stress disorder, unspecified: Secondary | ICD-10-CM | POA: Diagnosis not present

## 2017-11-22 DIAGNOSIS — F5105 Insomnia due to other mental disorder: Secondary | ICD-10-CM | POA: Diagnosis not present

## 2017-11-22 NOTE — Progress Notes (Signed)
Vicki [name] [id number] [birth year] 67 y.o.  Subjective:   Patient ID:  Vicki [name] is a 67 y.o. (DOB 1950/07/05) female.  Chief Complaint:  Chief Complaint  Patient presents with  . Anxiety  . Depression  . Sleeping Problem    HPI Vicki [name] presents to the office today for follow-up of depressiion and anxiety and sleep. "Best I've been" but overworked.  Got shot in her back and that's helped and she's more active.  Sleeping better has helped and the increased quetiapine helped. Pt reports that mood is Anxious and deep dark hole resolved and describes anxiety as Minimal. Anxiety symptoms include: Excessive Worry, Panic Symptoms, but much better.. Pt reports no sleep issues. Pt reports that appetite is good. Pt reports that energy is improved and improved. Concentration is good. Suicidal thoughts:  denied by patient.   Review of Systems:  Review of Systems  Neurological: Negative for tremors and weakness.  Psychiatric/Behavioral: Negative for agitation, behavioral problems, confusion, decreased concentration, dysphoric mood, hallucinations, self-injury, sleep disturbance and suicidal ideas. The patient is nervous/anxious. The patient is not hyperactive.     Medications: I have reviewed the patient's current medications.  Current Outpatient Medications  Medication Sig Dispense Refill  . ALPRAZolam (XANAX) 0.5 MG tablet Take 0.5 mg by mouth 2 (two) times daily as needed for anxiety. 1/2 - 1 bid prn    . aspirin 325 MG tablet Take 325 mg by mouth at bedtime.     . Cholecalciferol (DIALYVITE VITAMIN D 5000 PO) Take 5,000 Units by mouth daily.    . cloNIDine (CATAPRES) 0.1 MG tablet Take 0.1 mg by mouth 2 (two) times daily.    Marland Kitchen ibuprofen (ADVIL,MOTRIN) 800 MG tablet Take 1 tablet (800 mg total) by mouth every 8 (eight) hours as needed. 30 tablet 0  . MAGNESIUM PO Take 1 tablet by mouth daily.     . QUEtiapine (SEROQUEL) 300 MG tablet Take 300 mg by mouth at bedtime.    .  sertraline (ZOLOFT) 100 MG tablet Take 100 mg by mouth daily. 2.5 tab qd    . telmisartan (MICARDIS) 40 MG tablet TAKE 1 TABLET BY MOUTH ONCE DAILY FOR 90 DAYS  0  . metFORMIN (GLUCOPHAGE) 500 MG tablet Take 500 mg by mouth. 1 qam and 2 qpm    . Omega-3 Fatty Acids (FISH OIL) 1000 MG CAPS Take 1 capsule (1,000 mg total) by mouth 2 (two) times daily. (Patient not taking: Reported on 11/22/2017) 60 capsule 0   No current facility-administered medications for this visit.     Medication Side Effects: None  Allergies:  Allergies  Allergen Reactions  . Codeine Other (See Comments)    headache  . Crestor [Rosuvastatin] Other (See Comments)    Muscle aches  . Other Nausea And Vomiting    Anesthesia-severe vomiting  . Simvastatin Other (See Comments)    Muscle aches    Past Medical History:  Diagnosis Date  . Alcohol intoxication ([place]) 05/2014    ED note   . Anxiety   . Arthritis   . Boils   . Chronic back pain   . Community acquired pneumonia 01/05/2016  . Depression   . Fibromyalgia   . GERD (gastroesophageal reflux disease)   . Heart attack ([place])   . Hepatic cyst 01/06/2016  . Hyperlipidemia   . Hypertension   . Insomnia   . Leg cramps   . Migraines    with aura  . Mini stroke ([place]) 05/2014  with paralysis for 1 hr  . Osteopenia   . PONV (postoperative nausea and vomiting)    severe  . Postmenopausal bleeding 12/2015  . PTSD (post-traumatic stress disorder)   . S/P epidural steroid injection   . 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 Spinal stenosis   . 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 Strep throat 01/05/2016  . Stroke [facility])    3 strokes, no residual    Family History  Problem Relation Age of Onset  . Stroke Father   . Depression Father   . Stroke Mother   . Hypertension Brother   . Heart disease Brother        stint...PACER  . Heart attack Brother    . CAD Brother   . Heart attack Maternal Uncle   . Heart attack Maternal Grandmother   . Heart attack Brother   . Non-Hodgkin's lymphoma Brother   . CAD Brother   . Heart attack Maternal Uncle   . Heart attack Maternal Uncle   . Heart attack Maternal Uncle   . Heart attack Maternal Uncle   . Cholesteatoma Sister   . Hyperlipidemia Sister   . Other Brother        SUICIDE 05/2016    Social History   Socioeconomic History  . Marital status: Divorced    Spouse name: Not on file  . Number of children: Not on file  . Years of education: Not on file  . Highest education level: Not on file  Occupational History  . Not on file  Social Needs  . Financial resource strain: Not on file  . Food insecurity:    Worry: Not on file    Inability: Not on file  . Transportation needs:    Medical: Not on file    Non-medical: Not on file  Tobacco Use  . Smoking status: Former Research scientist (life sciences)  . Smokeless tobacco: Never Used  Substance and Sexual Activity  . Alcohol use: No  . Drug use: No  . Sexual activity: Not Currently    Partners: Male    Birth control/protection: Post-menopausal  Lifestyle  . Physical activity:    Days per week: Not on file    Minutes per session: Not on file  . Stress: Not on file  Relationships  . Social connections:    Talks on phone: Not on file    Gets together: Not on file    Attends religious service: Not on file    Active member of club or organization: Not on file    Attends meetings of clubs or organizations: Not on file    Relationship status: Not on file  . Intimate partner violence:    Fear of current or ex partner: Not on file    Emotionally abused: Not on file    Physically abused: Not on file    Forced sexual activity: Not on file  Other Topics Concern  . Not on file  Social History Narrative  . Not on file    Past Medical History, Surgical history, Social history, and Family history were reviewed and updated as appropriate.   Please see  review of systems for further details on the patient's review from today.   Objective:   Physical Exam:  LMP 07/18/2011   Physical Exam  Constitutional: She is oriented to person, place, and time. She  appears well-developed. No distress.  Musculoskeletal: She exhibits no deformity.  Neurological: She is alert and oriented to person, place, and time. She displays no tremor. Coordination and gait normal.  Psychiatric: Her speech is normal and behavior is normal. Judgment and thought content normal. Her mood appears anxious. Her affect is not angry, not blunt, not labile and not inappropriate. Cognition and memory are normal. She does not exhibit a depressed mood. She expresses no homicidal and no suicidal ideation. She expresses no suicidal plans and no homicidal plans.  Insight intact. No auditory or visual hallucinations. No delusions.  Anxiety is episodic. She is attentive.    Lab Review:     Component Value Date/Time   NA 133 (L) 02/19/2016 1515   K 4.9 02/19/2016 1515   CL 99 (L) 02/19/2016 1515   CO2 25 02/19/2016 1515   GLUCOSE 103 (H) 02/19/2016 1515   BUN 20 02/19/2016 1515   CREATININE 0.78 02/19/2016 1515   CREATININE 0.65 07/23/2015 1454   CALCIUM 9.6 02/19/2016 1515   PROT 6.6 07/23/2015 1454   ALBUMIN 4.1 07/23/2015 1454   AST 19 07/23/2015 1454   ALT 15 07/23/2015 1454   ALKPHOS 84 07/23/2015 1454   BILITOT 0.6 07/23/2015 1454   GFRNONAA >60 02/19/2016 1515   GFRAA >60 02/19/2016 1515       Component Value Date/Time   WBC 8.7 02/19/2016 1515   RBC 4.77 02/19/2016 1515   HGB 15.2 (H) 02/19/2016 1515   HGB 15.7 05/09/2012 1426   HCT 43.7 02/19/2016 1515   PLT 264 02/19/2016 1515   MCV 91.6 02/19/2016 1515   MCH 31.9 02/19/2016 1515   MCHC 34.8 02/19/2016 1515   RDW 12.9 02/19/2016 1515   LYMPHSABS 2.3 01/05/2016 2238   MONOABS 0.5 01/05/2016 2238   EOSABS 0.1 01/05/2016 2238   BASOSABS 0.1 01/05/2016 2238    No results found for: POCLITH, LITHIUM    No results found for: PHENYTOIN, PHENOBARB, VALPROATE, CBMZ   .res Assessment: Plan:    PTSD (post-traumatic stress disorder)  Major depressive disorder, recurrent episode, moderate (HCC)  Insomnia due to other mental disorder  Low serum vitamin D  Greater than 50% of face to face time with patient was spent on counseling and coordination of care. We discussed her long history of chronic major depression, anxiety and sleep problems.  The last increase in quetiapine and using short acting form has.  To improve her sleep.  Also reduction in pain has helped her sleep.  We discussed the relevance of vitamin D to mental health.  Low vitamin D is associated with increased risk of cognitive and depressive symptoms.  It was emphasized that she make sure that that get back into the normal range with her primary care doctor.  Discussed potential metabolic side effects associated with atypical antipsychotics, as well as potential risk for movement side effects. Advised pt to contact office if movement side effects occur.    PMP is clear with no Bz Rx this year.  She fears addiction bc her mother was an addict.  We discussed the short-term and long-term risks of benzodiazapines.  This appointment was 25 minutes  Follow-up 3 months  Lynder Parents MD, DFAPA    Future Appointments  Date Time Provider [place]  10/02/2018  3:00 PM Regina Eck, CNM [place] None    No orders of the defined types were placed in this encounter.     -------------------------------

## 2017-11-30 DIAGNOSIS — I10 Essential (primary) hypertension: Secondary | ICD-10-CM | POA: Diagnosis not present

## 2017-11-30 DIAGNOSIS — Z01818 Encounter for other preprocedural examination: Secondary | ICD-10-CM | POA: Diagnosis not present

## 2018-01-25 ENCOUNTER — Other Ambulatory Visit: Payer: Self-pay | Admitting: Psychiatry

## 2018-02-07 IMAGING — NM NM MISC PROCEDURE
3 series · 18 of 18 positions shown · non-contrast
Comparison: none

[Series 1: wbr_s-proj_st stress_(id)_sa · 6.5mm · 6.51mm/px · 6 of 512 frames shown (1 of 2)]
[frame 43/512]
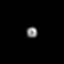
[frame 128/512]
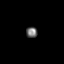
[frame 214/512]
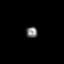
[frame 299/512]
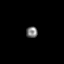
[frame 384/512]
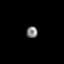
[frame 470/512]
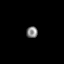

[Series 1: wbr_r-proj_st rest_(id)_sa · 6.5mm · 6.51mm/px · 6 of 64 frames shown]
[frame 6/64]
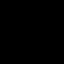
[frame 16/64]
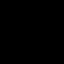
[frame 27/64]
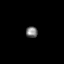
[frame 38/64]
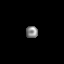
[frame 48/64]
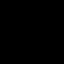
[frame 59/64]
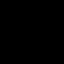

[Series 1: wbr_s-proj_st stress_(id)_sa · 6.5mm · 6.51mm/px · 6 of 64 frames shown (2 of 2)]
[frame 6/64]
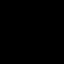
[frame 16/64]
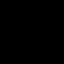
[frame 27/64]
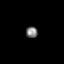
[frame 38/64]
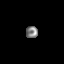
[frame 48/64]
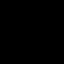
[frame 59/64]
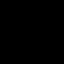

[18 of 18 positions shown; findings below may reference images not displayed]

Canned report from images found in remote index.

Refer to host system for actual result text.

## 2018-02-24 ENCOUNTER — Other Ambulatory Visit: Payer: Self-pay | Admitting: Psychiatry

## 2018-03-13 DIAGNOSIS — M5416 Radiculopathy, lumbar region: Secondary | ICD-10-CM | POA: Diagnosis not present

## 2018-03-13 DIAGNOSIS — M48061 Spinal stenosis, lumbar region without neurogenic claudication: Secondary | ICD-10-CM | POA: Diagnosis not present

## 2018-03-14 ENCOUNTER — Other Ambulatory Visit: Payer: Self-pay | Admitting: Psychiatry

## 2018-03-21 ENCOUNTER — Encounter: Payer: Self-pay | Admitting: Psychiatry

## 2018-03-21 ENCOUNTER — Ambulatory Visit: Payer: PPO | Admitting: Psychiatry

## 2018-03-21 DIAGNOSIS — G4721 Circadian rhythm sleep disorder, delayed sleep phase type: Secondary | ICD-10-CM

## 2018-03-21 DIAGNOSIS — F5105 Insomnia due to other mental disorder: Secondary | ICD-10-CM

## 2018-03-21 DIAGNOSIS — F4310 Post-traumatic stress disorder, unspecified: Secondary | ICD-10-CM | POA: Diagnosis not present

## 2018-03-21 DIAGNOSIS — F3340 Major depressive disorder, recurrent, in remission, unspecified: Secondary | ICD-10-CM

## 2018-03-21 MED ORDER — CLONIDINE HCL 0.1 MG PO TABS: 0.1000 mg | ORAL_TABLET | Freq: Every day | ORAL | 1 refills | Status: DC

## 2018-03-21 MED ORDER — ALPRAZOLAM 0.5 MG PO TABS: 0.5000 mg | ORAL_TABLET | Freq: Two times a day (BID) | ORAL | 4 refills | Status: DC | PRN

## 2018-03-21 MED ORDER — QUETIAPINE FUMARATE 300 MG PO TABS: 300.0000 mg | ORAL_TABLET | Freq: Every day | ORAL | 1 refills | Status: DC

## 2018-03-21 MED ORDER — SERTRALINE HCL 100 MG PO TABS: 250.0000 mg | ORAL_TABLET | Freq: Every day | ORAL | 1 refills | Status: DC

## 2018-03-21 MED ORDER — METFORMIN HCL 500 MG PO TABS: 500.0000 mg | ORAL_TABLET | Freq: Two times a day (BID) | ORAL | 1 refills | Status: DC

## 2018-03-21 NOTE — Progress Notes (Signed)
Vicki [name] [id number] [birth year] 68 y.o.  Subjective:   Patient ID:  Vicki [name] is a 68 y.o. (DOB 1950-03-09) female.  Chief Complaint:  Chief Complaint  Patient presents with  . Follow-up    Medication Management    HPI last seen November 22, 2017 [name] presents to the office today for follow-up of depressiion and anxiety and sleep.  Doing well.  Another bout with anxiety over nephew who is bipolar.  She's bailed him out and then had to evict him.  Threatened to kill her.  Then showed up crying on her shoulder and started over again trying to get money from her.  Then he started hate texting.  "Best I've been in years"  She still says. Pt reports that mood is Anxious and deep dark hole resolved and describes anxiety as Minimal. Anxiety symptoms include: Excessive Worry, Panic Symptoms, but much better.. Chronic unchanged sleep issues and naps some. Pt reports that appetite is good. Pt reports that energy is improved and improved. Concentration is good. Suicidal thoughts:  denied by patient.  Past Psychiatric Medication Trials: Clonidine 0.1 mg twice daily orthostatic, prazosin, trazodone 200, doxepin 10 no response   Review of Systems:  Review of Systems  Neurological: Negative for tremors and weakness.  Psychiatric/Behavioral: Negative for agitation, behavioral problems, confusion, decreased concentration, dysphoric mood, hallucinations, self-injury, sleep disturbance and suicidal ideas. The patient is nervous/anxious. The patient is not hyperactive.     Medications: I have reviewed the patient's current medications.  Current Outpatient Medications  Medication Sig Dispense Refill  . aspirin 325 MG tablet Take 325 mg by mouth at bedtime.     . Cholecalciferol (DIALYVITE VITAMIN D 5000 PO) Take 5,000 Units by mouth daily.    . cloNIDine (CATAPRES) 0.1 MG tablet Take 1 tablet (0.1 mg total) by mouth daily. 90 tablet 1  . Cyanocobalamin (VITAMIN B-12) 1000  MCG/15ML LIQD Take by mouth.    Marland Kitchen ibuprofen (ADVIL,MOTRIN) 800 MG tablet Take 1 tablet (800 mg total) by mouth every 8 (eight) hours as needed. 30 tablet 0  . MAGNESIUM PO Take 1 tablet by mouth daily.     . metFORMIN (GLUCOPHAGE) 500 MG tablet Take 1 tablet (500 mg total) by mouth 2 (two) times daily with a meal. 180 tablet 1  . QUEtiapine (SEROQUEL) 300 MG tablet Take 1 tablet (300 mg total) by mouth at bedtime. 90 tablet 1  . sertraline (ZOLOFT) 100 MG tablet Take 2.5 tablets (250 mg total) by mouth daily. 225 tablet 1  . telmisartan (MICARDIS) 40 MG tablet TAKE 1 TABLET BY MOUTH ONCE DAILY FOR 90 DAYS  0  . ALPRAZolam (XANAX) 0.5 MG tablet Take 1 tablet (0.5 mg total) by mouth 2 (two) times daily as needed for anxiety. 1/2 - 1 bid prn 60 tablet 4   No current facility-administered medications for this visit.     Medication Side Effects: None  Allergies:  Allergies  Allergen Reactions  . Codeine Other (See Comments)    headache  . Crestor [Rosuvastatin] Other (See Comments)    Muscle aches  . Other Nausea And Vomiting    Anesthesia-severe vomiting  . Simvastatin Other (See Comments)    Muscle aches  . Statins     Past Medical History:  Diagnosis Date  . Alcohol intoxication ([place]) 05/2014    ED note   . Anxiety   . Arthritis   . Boils   . Chronic back pain   . Community acquired  pneumonia 01/05/2016  . Depression   . Fibromyalgia   . GERD (gastroesophageal reflux disease)   . Heart attack ([place])   . Hepatic cyst 01/06/2016  . Hyperlipidemia   . Hypertension   . Insomnia   . Leg cramps   . Migraines    with aura  . 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 Osteopenia   . PONV (postoperative nausea and vomiting)    severe  . Postmenopausal bleeding 12/2015  . PTSD (post-traumatic stress disorder)   . S/P epidural steroid injection   . 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 Spinal stenosis   . 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 Strep throat 01/05/2016  . Stroke [facility])    3 strokes, no residual    Family History  Problem Relation Age of Onset  . Stroke Father   . Depression Father   . Stroke Mother   . Hypertension Brother   . Heart disease Brother        stint...PACER  . Heart attack Brother   . CAD Brother   . Heart attack Maternal Uncle   . Heart attack Maternal Grandmother   . Heart attack Brother   . Non-Hodgkin's lymphoma Brother   . CAD Brother   . Heart attack Maternal Uncle   . Heart attack Maternal Uncle   . Heart attack Maternal Uncle   . Heart attack Maternal Uncle   . Cholesteatoma Sister   . Hyperlipidemia Sister   . Other Brother        SUICIDE 05/2016    Social History   Socioeconomic History  . Marital status: Divorced    Spouse name: Not on file  . Number of children: Not on file  . Years of education: Not on file  . Highest education level: Not on file  Occupational History  . Not on file  Social Needs  . Financial resource strain: Not on file  . Food insecurity:    Worry: Not on file    Inability: Not on file  . Transportation needs:    Medical: Not on file    Non-medical: Not on file  Tobacco Use  . Smoking status: Former Research scientist (life sciences)  . Smokeless tobacco: Never Used  Substance and Sexual Activity  . Alcohol use: No  . Drug use: No  . Sexual activity: Not Currently    Partners: Male    Birth control/protection: Post-menopausal  Lifestyle  . Physical activity:    Days per week: Not on file    Minutes per session: Not on file  . Stress: Not on file  Relationships  . Social connections:    Talks on phone: Not on file    Gets together: Not on file    Attends religious service: Not on file    Active member of club or organization: Not on file    Attends meetings of clubs or organizations: Not on file    Relationship status: Not on file  . Intimate partner violence:     Fear of current or ex partner: Not on file    Emotionally abused: Not on file    Physically abused: Not on file    Forced sexual activity: Not on file  Other Topics Concern  . Not on file  Social History Narrative  . Not on  file    Past Medical History, Surgical history, Social history, and Family history were reviewed and updated as appropriate.   Please see review of systems for further details on the patient's review from today.   Objective:   Physical Exam:  LMP 07/18/2011   Physical Exam Constitutional:      General: She is not in acute distress.    Appearance: She is well-developed.  Musculoskeletal:        General: No deformity.  Neurological:     Mental Status: She is alert and oriented to person, place, and time.     Motor: No tremor.     Coordination: Coordination normal.     Gait: Gait normal.  Psychiatric:        Attention and Perception: She is attentive.        Mood and Affect: Mood is anxious. Mood is not depressed. Affect is not labile, blunt, angry or inappropriate.        Speech: Speech normal.        Behavior: Behavior normal.        Thought Content: Thought content normal. Thought content does not include homicidal or suicidal ideation. Thought content does not include homicidal or suicidal plan.        Judgment: Judgment normal.     Comments: Insight intact. No auditory or visual hallucinations. No delusions.  Anxiety is episodic.     Lab Review:     Component Value Date/Time   NA 133 (L) 02/19/2016 1515   K 4.9 02/19/2016 1515   CL 99 (L) 02/19/2016 1515   CO2 25 02/19/2016 1515   GLUCOSE 103 (H) 02/19/2016 1515   BUN 20 02/19/2016 1515   CREATININE 0.78 02/19/2016 1515   CREATININE 0.65 07/23/2015 1454   CALCIUM 9.6 02/19/2016 1515   PROT 6.6 07/23/2015 1454   ALBUMIN 4.1 07/23/2015 1454   AST 19 07/23/2015 1454   ALT 15 07/23/2015 1454   ALKPHOS 84 07/23/2015 1454   BILITOT 0.6 07/23/2015 1454   GFRNONAA >60 02/19/2016 1515   GFRAA  >60 02/19/2016 1515       Component Value Date/Time   WBC 8.7 02/19/2016 1515   RBC 4.77 02/19/2016 1515   HGB 15.2 (H) 02/19/2016 1515   HGB 15.7 05/09/2012 1426   HCT 43.7 02/19/2016 1515   PLT 264 02/19/2016 1515   MCV 91.6 02/19/2016 1515   MCH 31.9 02/19/2016 1515   MCHC 34.8 02/19/2016 1515   RDW 12.9 02/19/2016 1515   LYMPHSABS 2.3 01/05/2016 2238   MONOABS 0.5 01/05/2016 2238   EOSABS 0.1 01/05/2016 2238   BASOSABS 0.1 01/05/2016 2238    No results found for: POCLITH, LITHIUM   No results found for: PHENYTOIN, PHENOBARB, VALPROATE, CBMZ   .res Assessment: Plan:    PTSD (post-traumatic stress disorder)  Recurrent major depression in remission ([place])  Insomnia due to mental condition  Delayed sleep phase syndrome  Greater than 50% of face to face time with patient was spent on counseling and coordination of care. We discussed her long history of chronic major depression, anxiety and sleep problems.  The last increase in quetiapine and using short acting form has.  To improve her sleep.  Also reduction in pain has helped her sleep.  Overall doing the best she's done in years.  Discussed potential metabolic side effects associated with atypical antipsychotics, as well as potential risk for movement side effects. Advised pt to contact office if movement side effects occur.  Discussed the  use of metformin to help counteract weight gain associated with antipsychotics.  Discussed potential side effects including liver problems and low B12 among others.  PMP is clear with no Bz Rx this year.  She fears addiction bc her mother was an addict.  We discussed the short-term and long-term risks of benzodiazapines.  This appt was 30 mins.  Follow-up 4 months  Lynder Parents MD, DFAPA    Future Appointments  Date Time Provider [place]  10/02/2018  3:00 PM Regina Eck, CNM [place] None    No orders of the defined types were placed in this encounter.      -------------------------------

## 2018-04-10 DIAGNOSIS — M48061 Spinal stenosis, lumbar region without neurogenic claudication: Secondary | ICD-10-CM | POA: Diagnosis not present

## 2018-04-10 DIAGNOSIS — M5416 Radiculopathy, lumbar region: Secondary | ICD-10-CM | POA: Diagnosis not present

## 2018-05-01 DIAGNOSIS — M48061 Spinal stenosis, lumbar region without neurogenic claudication: Secondary | ICD-10-CM | POA: Diagnosis not present

## 2018-05-01 DIAGNOSIS — M5416 Radiculopathy, lumbar region: Secondary | ICD-10-CM | POA: Diagnosis not present

## 2018-05-01 DIAGNOSIS — R911 Solitary pulmonary nodule: Secondary | ICD-10-CM | POA: Diagnosis not present

## 2018-07-25 ENCOUNTER — Ambulatory Visit: Payer: PPO | Admitting: Psychiatry

## 2018-09-05 ENCOUNTER — Encounter: Payer: Self-pay | Admitting: Psychiatry

## 2018-09-05 ENCOUNTER — Other Ambulatory Visit: Payer: Self-pay

## 2018-09-05 ENCOUNTER — Ambulatory Visit (INDEPENDENT_AMBULATORY_CARE_PROVIDER_SITE_OTHER): Payer: PPO | Admitting: Psychiatry

## 2018-09-05 DIAGNOSIS — F4310 Post-traumatic stress disorder, unspecified: Secondary | ICD-10-CM | POA: Diagnosis not present

## 2018-09-05 DIAGNOSIS — G4721 Circadian rhythm sleep disorder, delayed sleep phase type: Secondary | ICD-10-CM | POA: Diagnosis not present

## 2018-09-05 DIAGNOSIS — F5105 Insomnia due to other mental disorder: Secondary | ICD-10-CM

## 2018-09-05 DIAGNOSIS — R7989 Other specified abnormal findings of blood chemistry: Secondary | ICD-10-CM | POA: Diagnosis not present

## 2018-09-05 DIAGNOSIS — F3340 Major depressive disorder, recurrent, in remission, unspecified: Secondary | ICD-10-CM

## 2018-09-05 MED ORDER — QUETIAPINE FUMARATE 300 MG PO TABS: 300.0000 mg | ORAL_TABLET | Freq: Every day | ORAL | 1 refills | Status: DC

## 2018-09-05 MED ORDER — SERTRALINE HCL 100 MG PO TABS: 250.0000 mg | ORAL_TABLET | Freq: Every day | ORAL | 1 refills | Status: DC

## 2018-09-05 MED ORDER — METFORMIN HCL 500 MG PO TABS: 500.0000 mg | ORAL_TABLET | Freq: Two times a day (BID) | ORAL | 1 refills | Status: DC

## 2018-09-05 MED ORDER — ZOLPIDEM TARTRATE 5 MG PO TABS: 5.0000 mg | ORAL_TABLET | Freq: Every evening | ORAL | 0 refills | Status: DC | PRN

## 2018-09-05 NOTE — Progress Notes (Signed)
SKILER TYE [id number] [birth year] 68 y.o.  Subjective:   Patient ID:  Vicki [name] is a 68 y.o. (DOB 12/09/1950) female.  Chief Complaint:  Chief Complaint  Patient presents with  . Follow-up    Medication Management  . Depression    Medication Management  . Other    PTSD    Depression        Associated symptoms include no decreased concentration and no suicidal ideas.  Vicki [name] presents to the office today for follow-up of depressiion and anxiety and sleep.  Last seen March 2020.  No meds were changed.  Still Doing well from mental health problem.  Struggled for first month after talking with brother over his belligerent son who won't let brother talk to the patient.  Another bout with anxiety over nephew who is bipolar.  She's bailed him out and then had to evict him.  Threatened to kill her.  Then showed up crying on her shoulder and started over again trying to get money from her.  Then he started hate texting.  Rare Xanax won't put her to sleep.  No sig caffeine.  Was drinking 4-5 cups daily.    Pt reports that mood is Anxious and deep dark hole resolved and describes anxiety as Minimal. Anxiety symptoms include: Excessive Worry, Panic Symptoms, but much better.. Chronic unchanged sleep issues and naps some. Gotten tolerant to Seroquel.   Last night 3-4 hours but eventually catches up.  Insomnia in cycles. Regular.   Pt reports that appetite is good. Pt reports that energy is improved and improved. Concentration is good. Suicidal thoughts:  denied by patient.  Before meds had spells of crying.    Past Psychiatric Medication Trials: Clonidine 0.1 mg twice daily orthostatic, prazosin, trazodone 200, doxepin 10 no response, quetiapine 300, Zoloft 250  Review of Systems:  Review of Systems  Musculoskeletal: Positive for arthralgias and back pain.  Neurological: Negative for tremors and weakness.  Psychiatric/Behavioral: Positive for depression. Negative for  agitation, behavioral problems, confusion, decreased concentration, dysphoric mood, hallucinations, self-injury, sleep disturbance and suicidal ideas. The patient is nervous/anxious. The patient is not hyperactive.     Medications: I have reviewed the patient's current medications.  Current Outpatient Medications  Medication Sig Dispense Refill  . ALPRAZolam (XANAX) 0.5 MG tablet Take 1 tablet (0.5 mg total) by mouth 2 (two) times daily as needed for anxiety. 1/2 - 1 bid prn 60 tablet 4  . aspirin 325 MG tablet Take 325 mg by mouth at bedtime.     . Calcium-Phosphorus-Vitamin D (CALCIUM GUMMIES PO) Take by mouth.    . Cholecalciferol (DIALYVITE VITAMIN D 5000 PO) Take 5,000 Units by mouth daily.    . cloNIDine (CATAPRES) 0.1 MG tablet Take 1 tablet (0.1 mg total) by mouth daily. 90 tablet 1  . Cyanocobalamin (VITAMIN B-12) 1000 MCG/15ML LIQD Take by mouth.    Marland Kitchen ibuprofen (ADVIL,MOTRIN) 800 MG tablet Take 1 tablet (800 mg total) by mouth every 8 (eight) hours as needed. 30 tablet 0  . MAGNESIUM PO Take 1 tablet by mouth daily.     . metFORMIN (GLUCOPHAGE) 500 MG tablet Take 1 tablet (500 mg total) by mouth 2 (two) times daily with a meal. 180 tablet 1  . QUEtiapine (SEROQUEL) 300 MG tablet Take 1 tablet (300 mg total) by mouth at bedtime. 90 tablet 1  . sertraline (ZOLOFT) 100 MG tablet Take 2.5 tablets (250 mg total) by mouth daily. 225 tablet 1  . telmisartan (  MICARDIS) 40 MG tablet TAKE 1 TABLET BY MOUTH ONCE DAILY FOR 90 DAYS  0   No current facility-administered medications for this visit.     Medication Side Effects: None  Allergies:  Allergies  Allergen Reactions  . Codeine Other (See Comments)    headache  . Crestor [Rosuvastatin] Other (See Comments)    Muscle aches  . Other Nausea And Vomiting    Anesthesia-severe vomiting  . Simvastatin Other (See Comments)    Muscle aches  . Statins     Past Medical History:  Diagnosis Date  . Alcohol intoxication ([place]) 05/2014     ED note   . Anxiety   . Arthritis   . Boils   . Chronic back pain   . Community acquired pneumonia 01/05/2016  . Depression   . Fibromyalgia   . GERD (gastroesophageal reflux disease)   . Heart attack ([place])   . Hepatic cyst 01/06/2016  . Hyperlipidemia   . Hypertension   . Insomnia   . Leg cramps   . Migraines    with aura  . 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 Osteopenia   . PONV (postoperative nausea and vomiting)    severe  . Postmenopausal bleeding 12/2015  . PTSD (post-traumatic stress disorder)   . S/P epidural steroid injection   . 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 Spinal stenosis   . 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 Strep throat 01/05/2016  . Stroke [facility])    3 strokes, no residual    Family History  Problem Relation Age of Onset  . Stroke Father   . Depression Father   . Stroke Mother   . Hypertension Brother   . Heart disease Brother        stint...PACER  . Heart attack Brother   . CAD Brother   . Heart attack Maternal Uncle   . Heart attack Maternal Grandmother   . Heart attack Brother   . Non-Hodgkin's lymphoma Brother   . CAD Brother   . Heart attack Maternal Uncle   . Heart attack Maternal Uncle   . Heart attack Maternal Uncle   . Heart attack Maternal Uncle   . Cholesteatoma Sister   . Hyperlipidemia Sister   . Other Brother        SUICIDE 05/2016    Social History   Socioeconomic History  . Marital status: Divorced    Spouse name: Not on file  . Number of children: Not on file  . Years of education: Not on file  . Highest education level: Not on file  Occupational History  . Not on file  Social Needs  . Financial resource strain: Not on file  . Food insecurity    Worry: Not on file    Inability: Not on file  . Transportation needs    Medical: Not on file    Non-medical: Not on file  Tobacco Use   . Smoking status: Former Research scientist (life sciences)  . Smokeless tobacco: Never Used  Substance and Sexual Activity  . Alcohol use: No  . Drug use: No  . Sexual activity: Not Currently    Partners: Male    Birth control/protection: Post-menopausal  Lifestyle  . Physical activity    Days per week: Not on file    Minutes per session: Not  on file  . Stress: Not on file  Relationships  . Social Herbalist on phone: Not on file    Gets together: Not on file    Attends religious service: Not on file    Active member of club or organization: Not on file    Attends meetings of clubs or organizations: Not on file    Relationship status: Not on file  . Intimate partner violence    Fear of current or ex partner: Not on file    Emotionally abused: Not on file    Physically abused: Not on file    Forced sexual activity: Not on file  Other Topics Concern  . Not on file  Social History Narrative  . Not on file    Past Medical History, Surgical history, Social history, and Family history were reviewed and updated as appropriate.   Please see review of systems for further details on the patient's review from today.   Objective:   Physical Exam:  LMP 07/18/2011   Physical Exam Constitutional:      General: She is not in acute distress.    Appearance: She is well-developed.  Musculoskeletal:        General: No deformity.  Neurological:     Mental Status: She is alert and oriented to person, place, and time.     Motor: No tremor.     Coordination: Coordination normal.     Gait: Gait normal.  Psychiatric:        Attention and Perception: She is attentive.        Mood and Affect: Mood is anxious. Mood is not depressed. Affect is not labile, blunt, angry or inappropriate.        Speech: Speech normal.        Behavior: Behavior normal.        Thought Content: Thought content normal. Thought content does not include homicidal or suicidal ideation. Thought content does not include homicidal or  suicidal plan.        Judgment: Judgment normal.     Comments: Insight intact. No auditory or visual hallucinations. No delusions.  Anxiety is episodic.     Lab Review:     Component Value Date/Time   NA 133 (L) 02/19/2016 1515   K 4.9 02/19/2016 1515   CL 99 (L) 02/19/2016 1515   CO2 25 02/19/2016 1515   GLUCOSE 103 (H) 02/19/2016 1515   BUN 20 02/19/2016 1515   CREATININE 0.78 02/19/2016 1515   CREATININE 0.65 07/23/2015 1454   CALCIUM 9.6 02/19/2016 1515   PROT 6.6 07/23/2015 1454   ALBUMIN 4.1 07/23/2015 1454   AST 19 07/23/2015 1454   ALT 15 07/23/2015 1454   ALKPHOS 84 07/23/2015 1454   BILITOT 0.6 07/23/2015 1454   GFRNONAA >60 02/19/2016 1515   GFRAA >60 02/19/2016 1515       Component Value Date/Time   WBC 8.7 02/19/2016 1515   RBC 4.77 02/19/2016 1515   HGB 15.2 (H) 02/19/2016 1515   HGB 15.7 05/09/2012 1426   HCT 43.7 02/19/2016 1515   PLT 264 02/19/2016 1515   MCV 91.6 02/19/2016 1515   MCH 31.9 02/19/2016 1515   MCHC 34.8 02/19/2016 1515   RDW 12.9 02/19/2016 1515   LYMPHSABS 2.3 01/05/2016 2238   MONOABS 0.5 01/05/2016 2238   EOSABS 0.1 01/05/2016 2238   BASOSABS 0.1 01/05/2016 2238    No results found for: POCLITH, LITHIUM   No results found for: PHENYTOIN, PHENOBARB,  VALPROATE, CBMZ   .res Assessment: Plan:    Arnesia was seen today for follow-up, depression and other.  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 We discussed her long history of chronic major depression, anxiety and sleep problems.  The last increase in quetiapine and using short acting form has helped with mood and has helped some with sleep.  She has become more Tolerant to the quetiapine lately and  is having more insomnia.  It does not appear to be hypomania that she recognizes but is somewhat cyclical.  Overall doing the best she's done in years.  The sertraline and quetiapine are helping with mood.  Is struggling with insomnia cycles.    Discussed potential metabolic side effects associated with atypical antipsychotics, as well as potential risk for movement side effects. Advised pt to contact office if movement side effects occur.  Discussed the use of metformin to help counteract weight gain associated with antipsychotics.  Discussed potential side effects including liver problems and low B12 among others. Metformin is helpful for weight loss . Continue Metformin  PMP is clear with no Bz Rx this year.  She fears addiction bc her mother was an addict.  We discussed the short-term and long-term risks of benzodiazapines.  Zolpidem 5 mg hs prn insomnia.  Don't take if not needed.  This appt was 30 mins.  Follow-up 6 months  Lynder Parents MD, DFAPA    Future Appointments  Date Time Provider [place]  10/02/2018  3:00 PM Regina Eck, CNM [place] None    No orders of the defined types were placed in this encounter.     -------------------------------

## 2018-09-12 DIAGNOSIS — I10 Essential (primary) hypertension: Secondary | ICD-10-CM | POA: Diagnosis not present

## 2018-09-12 DIAGNOSIS — M48061 Spinal stenosis, lumbar region without neurogenic claudication: Secondary | ICD-10-CM | POA: Diagnosis not present

## 2018-09-12 DIAGNOSIS — Z6834 Body mass index (BMI) 34.0-34.9, adult: Secondary | ICD-10-CM | POA: Diagnosis not present

## 2018-09-26 ENCOUNTER — Ambulatory Visit: Payer: PPO | Admitting: Certified Nurse Midwife

## 2018-09-28 ENCOUNTER — Other Ambulatory Visit: Payer: Self-pay

## 2018-10-02 ENCOUNTER — Other Ambulatory Visit: Payer: Self-pay

## 2018-10-02 ENCOUNTER — Encounter: Payer: Self-pay | Admitting: Certified Nurse Midwife

## 2018-10-02 ENCOUNTER — Ambulatory Visit (INDEPENDENT_AMBULATORY_CARE_PROVIDER_SITE_OTHER): Payer: PPO | Admitting: Certified Nurse Midwife

## 2018-10-02 VITALS — BP 118/80 | HR 70 | Temp 97.1°F | Resp 16 | Ht 61.5 in | Wt 193.0 lb

## 2018-10-02 DIAGNOSIS — N9489 Other specified conditions associated with female genital organs and menstrual cycle: Secondary | ICD-10-CM | POA: Diagnosis not present

## 2018-10-02 DIAGNOSIS — Z01411 Encounter for gynecological examination (general) (routine) with abnormal findings: Secondary | ICD-10-CM

## 2018-10-02 DIAGNOSIS — Z780 Asymptomatic menopausal state: Secondary | ICD-10-CM | POA: Diagnosis not present

## 2018-10-02 DIAGNOSIS — N852 Hypertrophy of uterus: Secondary | ICD-10-CM

## 2018-10-02 DIAGNOSIS — F1021 Alcohol dependence, in remission: Secondary | ICD-10-CM

## 2018-10-02 NOTE — Progress Notes (Signed)
68 y.o. [id number] Divorced  Caucasian Fe here for annual exam. Menopausal still having some night sweats. Sees PCP for medication management of hypertension/ glucose management and Dr. Marcelline Deist manages her antidepressants. All stable per patient. Has been battling weight since last visit and continues to lose slowly. Denies vaginal bleeding but some dryness. Uses OTC product with good relief. Has mammogram scheduled. No alcohol in the past 3 years! No other health issues today.  Patient's last menstrual period was 07/18/2011.          Sexually active: Yes.    The current method of family planning is post menopausal status.    Exercising: No.  exercise Smoker:  no  Review of Systems  Constitutional: Negative.   HENT: Negative.   Eyes: Negative.   Respiratory: Negative.   Cardiovascular: Negative.   Gastrointestinal: Negative.   Genitourinary: Negative.   Musculoskeletal: Negative.   Skin: Negative.   Neurological: Negative.   Endo/Heme/Allergies: Negative.   Psychiatric/Behavioral: Negative.     Health Maintenance: Pap:  12-30-15 neg HPV HR neg,09-20-17 neg HPV HR neg History of Abnormal Pap: no MMG:  12-28-16 category a density birads 1:neg Self Breast exams: yes Colonoscopy:  2018 normal per patient BMD:   2018 TDaP:  2018 Shingles: not done Pneumonia: had done Hep C and HIV: both neg 2018 Labs: no with PCP   reports that she has quit smoking. She has never used smokeless tobacco. She reports that she does not drink alcohol or use drugs.  Past Medical History:  Diagnosis Date  . Alcohol intoxication ([place]) 05/2014    ED note   . Anxiety   . Arthritis   . Boils   . Chronic back pain   . Community acquired pneumonia 01/05/2016  . Depression   . Fibromyalgia   . GERD (gastroesophageal reflux disease)   . Heart attack ([place])   . Hepatic cyst 01/06/2016  . Hyperlipidemia   . Hypertension   . Insomnia   . Leg cramps   . Migraines    with aura  . 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 Osteopenia   . PONV (postoperative nausea and vomiting)    severe  . Postmenopausal bleeding 12/2015  . PTSD (post-traumatic stress disorder)   . S/P epidural steroid injection   . 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 Spinal stenosis   . 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 Strep throat 01/05/2016  . Stroke [facility])    3 strokes, no residual    Past Surgical History:  Procedure Laterality Date  . BREAST ENHANCEMENT SURGERY    . CARDIAC CATHETERIZATION N/A 08/12/2015   Procedure: Left Heart Cath and Coronary Angiography;  Surgeon: Jettie Booze, MD;  Location: [place] [facility];  Service: Cardiovascular;  Laterality: N/A;  . COLONOSCOPY    . DILATATION & CURETTAGE/HYSTEROSCOPY WITH MYOSURE N/A 02/22/2016   Procedure: DILATATION & CURETTAGE/HYSTEROSCOPY WITH MYOSURE;  Surgeon: Nunzio Cobbs, MD;  Location: [facility];  Service: Gynecology;  Laterality: N/A;  . FACIAL COSMETIC SURGERY    . HAND SURGERY     nerve & tendon repair  . LUMBAR EPIDURAL INJECTION  06/16/2015  . SKIN BIOPSY Right 02/22/2016   Procedure: BIOPSY SKIN;  Surgeon: Nunzio Cobbs, MD;  Location: [facility];  Service:  Gynecology;  Laterality: Right;  . SQUAMOUS CELL CARCINOMA EXCISION    . WISDOM TOOTH EXTRACTION      Current Outpatient Medications  Medication Sig Dispense Refill  . ALPRAZolam (XANAX) 0.5 MG tablet Take 1 tablet (0.5 mg total) by mouth 2 (two) times daily as needed for anxiety. 1/2 - 1 bid prn 60 tablet 4  . aspirin 325 MG tablet Take 325 mg by mouth at bedtime.     . Calcium-Phosphorus-Vitamin D (CALCIUM GUMMIES PO) Take by mouth.    . Cholecalciferol (DIALYVITE VITAMIN D 5000 PO) Take 5,000 Units by mouth daily.    . cloNIDine (CATAPRES) 0.1 MG tablet Take 1 tablet (0.1 mg total) by mouth daily. 90 tablet 1  . Cyanocobalamin (VITAMIN B-12)  1000 MCG/15ML LIQD Take by mouth.    Marland Kitchen ibuprofen (ADVIL,MOTRIN) 800 MG tablet Take 1 tablet (800 mg total) by mouth every 8 (eight) hours as needed. 30 tablet 0  . MAGNESIUM PO Take 1 tablet by mouth daily.     . metFORMIN (GLUCOPHAGE) 500 MG tablet Take 1 tablet (500 mg total) by mouth 2 (two) times daily with a meal. 180 tablet 1  . QUEtiapine (SEROQUEL) 300 MG tablet Take 1 tablet (300 mg total) by mouth at bedtime. 90 tablet 1  . sertraline (ZOLOFT) 100 MG tablet Take 2.5 tablets (250 mg total) by mouth daily. 225 tablet 1  . telmisartan (MICARDIS) 40 MG tablet TAKE 1 TABLET BY MOUTH ONCE DAILY FOR 90 DAYS  0  . zolpidem (AMBIEN) 5 MG tablet Take 1 tablet (5 mg total) by mouth at bedtime as needed for sleep. 30 tablet 0   No current facility-administered medications for this visit.     Family History  Problem Relation Age of Onset  . Stroke Father   . Depression Father   . Stroke Mother   . Hypertension Brother   . Heart disease Brother        stint...PACER  . Heart attack Brother   . CAD Brother   . Heart attack Maternal Uncle   . Heart attack Maternal Grandmother   . Heart attack Brother   . Non-Hodgkin's lymphoma Brother   . CAD Brother   . Heart attack Maternal Uncle   . Heart attack Maternal Uncle   . Heart attack Maternal Uncle   . Heart attack Maternal Uncle   . Cholesteatoma Sister   . Hyperlipidemia Sister   . Other Brother        SUICIDE 05/2016    ROS:  Pertinent items are noted in HPI.  Otherwise, a comprehensive ROS was negative.  Exam:   LMP 07/18/2011    Ht Readings from Last 3 Encounters:  09/20/17 5' 2.25" (1.581 m)  12/01/16 5\' 1"  (1.549 m)  09/14/16 5' 1.75" (1.568 m)    General appearance: alert, cooperative and appears stated age Head: Normocephalic, without obvious abnormality, atraumatic Neck: no adenopathy, supple, symmetrical, trachea midline and thyroid normal to inspection and palpation Lungs: clear to auscultation bilaterally Breasts:  normal appearance, no masses or tenderness, No nipple retraction or dimpling, No nipple discharge or bleeding, No axillary or supraclavicular adenopathy, pendulous Heart: regular rate and rhythm Abdomen: soft, non-tender; no masses,  no organomegaly Extremities: extremities normal, atraumatic, no cyanosis or edema Skin: Skin color, texture, turgor normal. No rashes or lesions Lymph nodes: Cervical, supraclavicular, and axillary nodes normal. No abnormal inguinal nodes palpated Neurologic: Grossly normal   Pelvic: External genitalia:  no lesions  Urethra:  normal appearing urethra with no masses, tenderness or lesions              Bartholin's and Skene's: normal                 Vagina: normal appearing vagina with normal color and discharge, no lesions              Cervix: no cervical motion tenderness, no lesions and normal appearance              Pap taken: No. Bimanual Exam:  Uterus:  questionable uterine enlargement vs left adnexal mass              Adnexa: questionable left adnexal mass               Rectovaginal: Confirms               Anus:  normal sphincter tone, no lesions  Chaperone present: yes  A:  Well Woman with normal exam  Post menopausal no HRT  Questionable enlarged uterus vs left adnexal mass  Glucose/hypertension,cholesterol, depression/anxiety with MD  Management. All stable per patient.  Recovering Alcoholic  Mammogram overdue    P:   Reviewed health and wellness pertinent to exam  Aware of need to advise if vaginal bleeding  Discussed uterine/left adnexal finding and need to evaluate with PUS. Questions addressed. Patient will be called with insurance information and scheduled.  Continue follow up as indicated with MD.  Congratulated on her success to this point, and to continue to take care of herself.  Stressed importance of mammogram, she is aware and will schedule.  Pap smear: no   counseled on breast self exam, mammography screening,  feminine hygiene, menopause, adequate intake of calcium and vitamin D, diet and exercise  return annually or prn  An After Visit Summary was printed and given to the patient.

## 2018-10-02 NOTE — Patient Instructions (Signed)

## 2018-10-17 DIAGNOSIS — G459 Transient cerebral ischemic attack, unspecified: Secondary | ICD-10-CM | POA: Diagnosis not present

## 2018-10-17 DIAGNOSIS — E782 Mixed hyperlipidemia: Secondary | ICD-10-CM | POA: Diagnosis not present

## 2018-10-17 DIAGNOSIS — E785 Hyperlipidemia, unspecified: Secondary | ICD-10-CM | POA: Diagnosis not present

## 2018-10-17 DIAGNOSIS — I10 Essential (primary) hypertension: Secondary | ICD-10-CM | POA: Diagnosis not present

## 2018-10-30 DIAGNOSIS — M48061 Spinal stenosis, lumbar region without neurogenic claudication: Secondary | ICD-10-CM | POA: Diagnosis not present

## 2018-11-16 DIAGNOSIS — E785 Hyperlipidemia, unspecified: Secondary | ICD-10-CM | POA: Diagnosis not present

## 2018-11-16 DIAGNOSIS — I10 Essential (primary) hypertension: Secondary | ICD-10-CM | POA: Diagnosis not present

## 2018-11-16 DIAGNOSIS — E782 Mixed hyperlipidemia: Secondary | ICD-10-CM | POA: Diagnosis not present

## 2018-11-16 DIAGNOSIS — G459 Transient cerebral ischemic attack, unspecified: Secondary | ICD-10-CM | POA: Diagnosis not present

## 2018-11-28 DIAGNOSIS — M545 Low back pain: Secondary | ICD-10-CM | POA: Diagnosis not present

## 2018-11-28 DIAGNOSIS — M48061 Spinal stenosis, lumbar region without neurogenic claudication: Secondary | ICD-10-CM | POA: Diagnosis not present

## 2018-11-30 ENCOUNTER — Other Ambulatory Visit: Payer: Self-pay | Admitting: Psychiatry

## 2018-11-30 NOTE — Telephone Encounter (Signed)
Last fill July, doesn't take often. Other refills on file expired

## 2018-12-05 DIAGNOSIS — M48061 Spinal stenosis, lumbar region without neurogenic claudication: Secondary | ICD-10-CM | POA: Diagnosis not present

## 2018-12-05 DIAGNOSIS — Z6825 Body mass index (BMI) 25.0-25.9, adult: Secondary | ICD-10-CM | POA: Diagnosis not present

## 2018-12-11 DIAGNOSIS — I10 Essential (primary) hypertension: Secondary | ICD-10-CM | POA: Diagnosis not present

## 2018-12-11 DIAGNOSIS — E785 Hyperlipidemia, unspecified: Secondary | ICD-10-CM | POA: Diagnosis not present

## 2018-12-11 DIAGNOSIS — G459 Transient cerebral ischemic attack, unspecified: Secondary | ICD-10-CM | POA: Diagnosis not present

## 2018-12-11 DIAGNOSIS — E782 Mixed hyperlipidemia: Secondary | ICD-10-CM | POA: Diagnosis not present

## 2018-12-18 ENCOUNTER — Telehealth: Payer: Self-pay | Admitting: Psychiatry

## 2018-12-18 DIAGNOSIS — M4316 Spondylolisthesis, lumbar region: Secondary | ICD-10-CM | POA: Diagnosis not present

## 2018-12-18 NOTE — Telephone Encounter (Signed)
Pt called and LM that she wishes to cut back on all of her meds. She has already cut back by half starting this week. Please advise.

## 2018-12-20 NOTE — Telephone Encounter (Signed)
Reducing the medication this far in advance of surgery is more dangerous than continuing the current dosage.  If she feels particularly concerned when she gets a surgery date she can reduce the medicines in half for 3 days prior to surgery.  That is more than adequate.  Then raise the dosage back up to the previous dosage immediately after surgery.

## 2018-12-21 NOTE — Telephone Encounter (Signed)
Informed patient of recommendation and she agrees and glad she called to check. She's still waiting on surgery date but will do as advised. Instructed to call back with any issues.

## 2018-12-24 ENCOUNTER — Other Ambulatory Visit: Payer: Self-pay | Admitting: Neurological Surgery

## 2018-12-25 ENCOUNTER — Other Ambulatory Visit: Payer: Self-pay | Admitting: Neurological Surgery

## 2018-12-26 NOTE — Progress Notes (Signed)
[facility] [address] [facility], [place] [address] Phone: [phone] Fax: [phone]  [facility] ([place]), [place] - [address] [place] [address] Phone: [phone] Fax: [phone]      Your procedure is scheduled on December 14  Report to [facility] Main Entrance "A" at 1100 A.M., and check in at the Admitting office.  Call this number if you have problems the morning of surgery:  [phone]  Call [phone] if you have any questions prior to your surgery date Monday-Friday 8am-4pm    Remember:  Do not eat or drink after midnight the night before your surgery    Take these medicines the morning of surgery with A SIP OF WATER  ALPRAZolam (XANAX)  cloNIDine (CATAPRES)   7 days prior to surgery STOP taking any Aspirin (unless otherwise instructed by your surgeon), Aleve, Naproxen, Ibuprofen, Motrin, Advil, Goody's, BC's, all herbal medications, fish oil, and all vitamins.   WHAT DO I DO ABOUT MY DIABETES MEDICATION?   Marland Kitchen Do not take oral diabetes medicines (pills) the morning of surgery. metFORMIN (GLUCOPHAGE)   HOW TO MANAGE YOUR DIABETES BEFORE AND AFTER SURGERY  Why is it important to control my blood sugar before and after surgery? . Improving blood sugar levels before and after surgery helps healing and can limit problems. . A way of improving blood sugar control is eating a healthy diet by: o  Eating less sugar and carbohydrates o  Increasing activity/exercise o  Talking with your doctor about reaching your blood sugar goals . High blood sugars (greater than 180 mg/dL) can raise your risk of infections and slow your recovery, so you will need to focus on controlling your diabetes during the weeks before surgery. . Make sure that the doctor who takes care of your diabetes knows about your planned surgery including the date and  location.  How do I manage my blood sugar before surgery? . Check your blood sugar at least 4 times a day, starting 2 days before surgery, to make sure that the level is not too high or low. . Check your blood sugar the morning of your surgery when you wake up and every 2 hours until you get to the Short Stay unit. o If your blood sugar is less than 70 mg/dL, you will need to treat for low blood sugar: - Do not take insulin. - Treat a low blood sugar (less than 70 mg/dL) with  cup of clear juice (cranberry or apple), 4 glucose tablets, OR glucose gel. - Recheck blood sugar in 15 minutes after treatment (to make sure it is greater than 70 mg/dL). If your blood sugar is not greater than 70 mg/dL on recheck, call [phone] for further instructions. . Report your blood sugar to the short stay nurse when you get to Short Stay.  . If you are admitted to the hospital after surgery: o Your blood sugar will be checked by the staff and you will probably be given insulin after surgery (instead of oral diabetes medicines) to make sure you have good blood sugar levels. o The goal for blood sugar control after surgery is 80-180 mg/dL.  The Morning of Surgery  Do not wear jewelry, make-up or nail polish.  Do not wear lotions, powders, or perfumes, or deodorant  Do not shave 48 hours prior to surgery.   Do not bring valuables to the hospital.  [facility] is not responsible for any belongings or valuables.  If you are a smoker, DO NOT Smoke 24 hours prior to surgery  If you wear a CPAP at night please bring your mask, tubing, and machine the morning of surgery   Remember that you must have someone to transport you home after your surgery, and remain with you for 24 hours if you are discharged the same day.   Please bring cases for contacts, glasses, hearing aids, dentures or bridgework because it cannot be worn into surgery.    Leave your suitcase in the car.  After surgery it may be brought to  your room.  For patients admitted to the hospital, discharge time will be determined by your treatment team.  Patients discharged the day of surgery will not be allowed to drive home.    Special instructions:   [facility]- Preparing For Surgery  Before surgery, you can play an important role. Because skin is not sterile, your skin needs to be as free of germs as possible. You can reduce the number of germs on your skin by washing with CHG (chlorahexidine gluconate) Soap before surgery.  CHG is an antiseptic cleaner which kills germs and bonds with the skin to continue killing germs even after washing.    Oral Hygiene is also important to reduce your risk of infection.  Remember - BRUSH YOUR TEETH THE MORNING OF SURGERY WITH YOUR REGULAR TOOTHPASTE  Please do not use if you have an allergy to CHG or antibacterial soaps. If your skin becomes reddened/irritated stop using the CHG.  Do not shave (including legs and underarms) for at least 48 hours prior to first CHG shower. It is OK to shave your face.  Please follow these instructions carefully.   1. Shower the NIGHT BEFORE SURGERY and the MORNING OF SURGERY with CHG Soap.   2. If you chose to wash your hair, wash your hair first as usual with your normal shampoo.  3. After you shampoo, rinse your hair and body thoroughly to remove the shampoo.  4. Use CHG as you would any other liquid soap. You can apply CHG directly to the skin and wash gently with a scrungie or a clean washcloth.   5. Apply the CHG Soap to your body ONLY FROM THE NECK DOWN.  Do not use on open wounds or open sores. Avoid contact with your eyes, ears, mouth and genitals (private parts). Wash Face and genitals (private parts)  with your normal soap.   6. Wash thoroughly, paying special attention to the area where your surgery will be performed.  7. Thoroughly rinse your body with warm water from the neck down.  8. DO NOT shower/wash with your normal soap after using  and rinsing off the CHG Soap.  9. Pat yourself dry with a CLEAN TOWEL.  10. Wear CLEAN PAJAMAS to bed the night before surgery, wear comfortable clothes the morning of surgery  11. Place CLEAN SHEETS on your bed the night of your first shower and DO NOT SLEEP WITH PETS.    Day of Surgery:  Please shower the morning of surgery with the CHG soap Do not apply any deodorants/lotions. Please wear clean clothes to the hospital/surgery center.   Remember to brush your teeth WITH YOUR REGULAR TOOTHPASTE.   Please read over the following fact sheets that you were given.

## 2018-12-27 ENCOUNTER — Other Ambulatory Visit: Payer: Self-pay

## 2018-12-27 ENCOUNTER — Encounter (HOSPITAL_COMMUNITY): Payer: Self-pay

## 2018-12-27 ENCOUNTER — Other Ambulatory Visit (HOSPITAL_COMMUNITY)
Admission: RE | Admit: 2018-12-27 | Discharge: 2018-12-27 | Disposition: A | Discharge status: Home / Self Care | Payer: PPO | Source: Ambulatory Visit | Attending: Neurological Surgery | Admitting: Neurological Surgery

## 2018-12-27 ENCOUNTER — Encounter (HOSPITAL_COMMUNITY)
Admission: RE | Admit: 2018-12-27 | Discharge: 2018-12-27 | Disposition: A | Discharge status: Home / Self Care | Payer: PPO | Source: Ambulatory Visit | Attending: Neurological Surgery | Admitting: Neurological Surgery

## 2018-12-27 DIAGNOSIS — Z20828 Contact with and (suspected) exposure to other viral communicable diseases: Secondary | ICD-10-CM | POA: Insufficient documentation

## 2018-12-27 DIAGNOSIS — Z01812 Encounter for preprocedural laboratory examination: Secondary | ICD-10-CM | POA: Diagnosis not present

## 2018-12-27 LAB — TYPE AND SCREEN
ABO/RH(D): O NEG
Antibody Screen: NEGATIVE

## 2018-12-27 LAB — CBC
HCT: 46.2 % — ABNORMAL HIGH (ref 36.0–46.0)
Hemoglobin: 15.2 g/dL — ABNORMAL HIGH (ref 12.0–15.0)
MCH: 30.2 pg (ref 26.0–34.0)
MCHC: 32.9 g/dL (ref 30.0–36.0)
MCV: 91.8 fL (ref 80.0–100.0)
Platelets: 259 10*3/uL (ref 150–400)
RBC: 5.03 MIL/uL (ref 3.87–5.11)
RDW: 12.9 % (ref 11.5–15.5)
WBC: 8.9 10*3/uL (ref 4.0–10.5)
nRBC: 0 % (ref 0.0–0.2)

## 2018-12-27 LAB — BASIC METABOLIC PANEL
Anion gap: 9 (ref 5–15)
BUN: 16 mg/dL (ref 8–23)
CO2: 24 mmol/L (ref 22–32)
Calcium: 9.3 mg/dL (ref 8.9–10.3)
Chloride: 104 mmol/L (ref 98–111)
Creatinine, Ser: 0.83 mg/dL (ref 0.44–1.00)
GFR calc Af Amer: >60 mL/min (ref >60)
GFR calc non Af Amer: >60 mL/min (ref >60)
Glucose, Bld: 112 mg/dL — ABNORMAL HIGH (ref 70–99)
Potassium: 4.8 mmol/L (ref 3.5–5.1)
Sodium: 137 mmol/L (ref 135–145)

## 2018-12-27 LAB — ABO/RH: ABO/RH(D): O NEG

## 2018-12-27 LAB — HEMOGLOBIN A1C
Hgb A1c MFr Bld: 5.3 % (ref 4.8–5.6)
Mean Plasma Glucose: 105.41 mg/dL

## 2018-12-27 LAB — SURGICAL PCR SCREEN
MRSA, PCR: NEGATIVE
Staphylococcus aureus: NEGATIVE

## 2018-12-27 NOTE — Progress Notes (Signed)
PCP:  Dr. Carol Ada Cardiologist:  Dr. Irish Lack  EKG:  12/27/18 CXR:  N/A ECHO:  05/25/14 Stress Test:  08/05/15 Cardiac Cath:  08/12/15  Anesthesia Review:  Yes, cardiac history  Covid test 12/27/18   Patient denies shortness of breath, fever, cough, and chest pain at PAT appointment.  Patient verbalized understanding of instructions provided today at the PAT appointment.  Patient asked to review instructions at home and day of surgery.

## 2018-12-28 LAB — NOVEL CORONAVIRUS, NAA (HOSP ORDER, SEND-OUT TO REF LAB; TAT 18-24 HRS): SARS-CoV-2, NAA: NOT DETECTED

## 2018-12-28 NOTE — Progress Notes (Signed)
Anesthesia Chart Review:   Pt had cardiology eval in 2017 for atypical chest pain. Nuclear stress was abnormal, however subsequent cath showed nonobstructive CAD. She was advised to continue preventative therapy and risk factor modification and followup with cardiology PRN.  Preop labs reviewed, WNL.   EKG 12/27/18: NSR. Rate 87.  Cath 08/12/15:  Severe right radial artery spasm making catheter exchanges difficult. Would not use radial approach in the future if emergent cath was needed.  Nonobstructive CAD.  LV end diastolic pressure is normal.  There is no aortic valve stenosis.   Continue preventive therapy and risk factor modification.  Nuclear stress 08/05/15:  Nuclear stress EF: 79%.  No T wave inversion was noted during stress.  There was no ST segment deviation noted during stress.  Defect 1: There is a large defect of severe severity.  Findings consistent with ischemia.  This is an intermediate risk study.   Large size, severe intensity reversible (SDS 8) inferior wall defect suggestive of ischemia. LVEF 79% with normal wall motion. Abnormal TID ratio of 1.64 -consider possible multivessel CAD. This is an intermediate risk study.   TTE 05/25/14: - Left ventricle: The cavity size was normal. Systolic function was   normal. The estimated ejection fraction was in the range of 60%   to 65%. Wall motion was normal; there were no regional wall   motion abnormalities. Doppler parameters are consistent with   abnormal left ventricular relaxation (grade 1 diastolic   dysfunction).    Wynonia Musty [facility] Short Stay Center/Anesthesiology Phone [phone] 12/28/2018 11:01 AM

## 2018-12-28 NOTE — Anesthesia Preprocedure Evaluation (Addendum)
Anesthesia Evaluation  Patient identified by MRN, date of birth, ID band Patient awake    Reviewed: Allergy & Precautions, NPO status , Patient's Chart, lab work & pertinent test results  History of Anesthesia Complications (+) PONV and history of anesthetic complications  Airway Mallampati: II  TM Distance: >3 FB Neck ROM: Full    Dental  (+) Dental Advisory Given, Implants   Pulmonary former smoker,    Pulmonary exam normal        Cardiovascular hypertension, Pt. on medications (-) angina+ Past MI  Normal cardiovascular exam   Pt had cardiology eval in 2017 for atypical chest pain. Nuclear stress was abnormal, however subsequent cath showed nonobstructive CAD. She was advised to continue preventative therapy and risk factor modification and followup with cardiology PRN.    Neuro/Psych  Headaches, Seizures -, Well Controlled,  PSYCHIATRIC DISORDERS Anxiety Depression  PTSD  Hx spinal stenosis Constant lower extremity pain, R>L  CVA, No Residual Symptoms    GI/Hepatic GERD  Controlled and Medicated,(+)     substance abuse  alcohol use,   Endo/Other   Obesity   Renal/GU negative Renal ROS     Musculoskeletal  (+) Arthritis , Fibromyalgia -  Abdominal   Peds  Hematology  325mg  ASA QD, none x 1 week    Anesthesia Other Findings Covid neg 12/10   Reproductive/Obstetrics                           Anesthesia Physical Anesthesia Plan  ASA: III  Anesthesia Plan: General   Post-op Pain Management:    Induction: Intravenous  PONV Risk Score and Plan: 4 or greater and Treatment may vary due to age or medical condition, Ondansetron and TIVA  Airway Management Planned: Oral ETT  Additional Equipment: None  Intra-op Plan:   Post-operative Plan: Extubation in OR  Informed Consent: I have reviewed the patients History and Physical, chart, labs and discussed the procedure  including the risks, benefits and alternatives for the proposed anesthesia with the patient or authorized representative who has indicated his/her understanding and acceptance.     Dental advisory given  Plan Discussed with: CRNA and Anesthesiologist  Anesthesia Plan Comments:       Anesthesia Quick Evaluation

## 2018-12-31 ENCOUNTER — Encounter (HOSPITAL_COMMUNITY)
Admission: RE | Disposition: A | Discharge status: Home / Self Care | Payer: Self-pay | Source: Home / Self Care | Attending: Neurological Surgery

## 2018-12-31 ENCOUNTER — Inpatient Hospital Stay (HOSPITAL_COMMUNITY): Payer: PPO | Admitting: Physician Assistant

## 2018-12-31 ENCOUNTER — Inpatient Hospital Stay (HOSPITAL_COMMUNITY)
Admission: RE | Admit: 2018-12-31 | Discharge: 2019-01-01 | DRG: 460 | Disposition: A | Discharge status: Home / Self Care | Payer: PPO | Attending: Neurological Surgery | Admitting: Neurological Surgery

## 2018-12-31 ENCOUNTER — Encounter (HOSPITAL_COMMUNITY): Payer: Self-pay | Admitting: Neurological Surgery

## 2018-12-31 ENCOUNTER — Inpatient Hospital Stay (HOSPITAL_COMMUNITY): Payer: PPO

## 2018-12-31 ENCOUNTER — Inpatient Hospital Stay (HOSPITAL_COMMUNITY): Payer: PPO | Admitting: Anesthesiology

## 2018-12-31 ENCOUNTER — Other Ambulatory Visit: Payer: Self-pay

## 2018-12-31 DIAGNOSIS — Z01818 Encounter for other preprocedural examination: Secondary | ICD-10-CM | POA: Diagnosis not present

## 2018-12-31 DIAGNOSIS — Z888 Allergy status to other drugs, medicaments and biological substances status: Secondary | ICD-10-CM | POA: Diagnosis not present

## 2018-12-31 DIAGNOSIS — Z823 Family history of stroke: Secondary | ICD-10-CM

## 2018-12-31 DIAGNOSIS — Z87891 Personal history of nicotine dependence: Secondary | ICD-10-CM

## 2018-12-31 DIAGNOSIS — Z807 Family history of other malignant neoplasms of lymphoid, hematopoietic and related tissues: Secondary | ICD-10-CM

## 2018-12-31 DIAGNOSIS — Z6834 Body mass index (BMI) 34.0-34.9, adult: Secondary | ICD-10-CM

## 2018-12-31 DIAGNOSIS — Z8249 Family history of ischemic heart disease and other diseases of the circulatory system: Secondary | ICD-10-CM | POA: Diagnosis not present

## 2018-12-31 DIAGNOSIS — M4316 Spondylolisthesis, lumbar region: Secondary | ICD-10-CM | POA: Diagnosis not present

## 2018-12-31 DIAGNOSIS — R569 Unspecified convulsions: Secondary | ICD-10-CM | POA: Diagnosis present

## 2018-12-31 DIAGNOSIS — M8580 Other specified disorders of bone density and structure, unspecified site: Secondary | ICD-10-CM | POA: Diagnosis present

## 2018-12-31 DIAGNOSIS — E785 Hyperlipidemia, unspecified: Secondary | ICD-10-CM | POA: Diagnosis not present

## 2018-12-31 DIAGNOSIS — F4310 Post-traumatic stress disorder, unspecified: Secondary | ICD-10-CM | POA: Diagnosis not present

## 2018-12-31 DIAGNOSIS — F419 Anxiety disorder, unspecified: Secondary | ICD-10-CM | POA: Diagnosis present

## 2018-12-31 DIAGNOSIS — M797 Fibromyalgia: Secondary | ICD-10-CM | POA: Diagnosis not present

## 2018-12-31 DIAGNOSIS — Z981 Arthrodesis status: Secondary | ICD-10-CM

## 2018-12-31 DIAGNOSIS — Z885 Allergy status to narcotic agent status: Secondary | ICD-10-CM | POA: Diagnosis not present

## 2018-12-31 DIAGNOSIS — Z8673 Personal history of transient ischemic attack (TIA), and cerebral infarction without residual deficits: Secondary | ICD-10-CM

## 2018-12-31 DIAGNOSIS — H5440 Blindness, one eye, unspecified eye: Secondary | ICD-10-CM | POA: Diagnosis present

## 2018-12-31 DIAGNOSIS — M48061 Spinal stenosis, lumbar region without neurogenic claudication: Secondary | ICD-10-CM | POA: Diagnosis present

## 2018-12-31 DIAGNOSIS — Z79899 Other long term (current) drug therapy: Secondary | ICD-10-CM

## 2018-12-31 DIAGNOSIS — K219 Gastro-esophageal reflux disease without esophagitis: Secondary | ICD-10-CM | POA: Diagnosis present

## 2018-12-31 DIAGNOSIS — Z818 Family history of other mental and behavioral disorders: Secondary | ICD-10-CM | POA: Diagnosis not present

## 2018-12-31 DIAGNOSIS — G4700 Insomnia, unspecified: Secondary | ICD-10-CM | POA: Diagnosis present

## 2018-12-31 DIAGNOSIS — Z7984 Long term (current) use of oral hypoglycemic drugs: Secondary | ICD-10-CM

## 2018-12-31 DIAGNOSIS — M47816 Spondylosis without myelopathy or radiculopathy, lumbar region: Secondary | ICD-10-CM | POA: Diagnosis not present

## 2018-12-31 DIAGNOSIS — Z20828 Contact with and (suspected) exposure to other viral communicable diseases: Secondary | ICD-10-CM | POA: Diagnosis not present

## 2018-12-31 DIAGNOSIS — I252 Old myocardial infarction: Secondary | ICD-10-CM | POA: Diagnosis not present

## 2018-12-31 DIAGNOSIS — M4310 Spondylolisthesis, site unspecified: Secondary | ICD-10-CM

## 2018-12-31 DIAGNOSIS — I10 Essential (primary) hypertension: Secondary | ICD-10-CM | POA: Diagnosis present

## 2018-12-31 DIAGNOSIS — Z8349 Family history of other endocrine, nutritional and metabolic diseases: Secondary | ICD-10-CM | POA: Diagnosis not present

## 2018-12-31 DIAGNOSIS — Z419 Encounter for procedure for purposes other than remedying health state, unspecified: Secondary | ICD-10-CM

## 2018-12-31 DIAGNOSIS — M4326 Fusion of spine, lumbar region: Secondary | ICD-10-CM | POA: Diagnosis not present

## 2018-12-31 HISTORY — DX: Arthrodesis status: Z98.1

## 2018-12-31 LAB — GLUCOSE, CAPILLARY
Glucose-Capillary: 114 mg/dL — ABNORMAL HIGH (ref 70–99)
Glucose-Capillary: 129 mg/dL — ABNORMAL HIGH (ref 70–99)

## 2018-12-31 SURGERY — POSTERIOR LUMBAR FUSION 1 LEVEL
Anesthesia: General | Site: Spine Lumbar

## 2018-12-31 MED ORDER — PHENYLEPHRINE 40 MCG/ML (10ML) SYRINGE FOR IV PUSH (FOR BLOOD PRESSURE SUPPORT)
PREFILLED_SYRINGE | INTRAVENOUS | Status: AC
  Filled 2018-12-31: qty 10

## 2018-12-31 MED ORDER — THROMBIN 5000 UNITS EX SOLR
OROMUCOSAL | Status: DC | PRN
  Administered 2018-12-31: 14:00:00 5 mL via TOPICAL

## 2018-12-31 MED ORDER — QUETIAPINE FUMARATE 300 MG PO TABS
300.0000 mg | ORAL_TABLET | Freq: Every day | ORAL | Status: DC
  Filled 2018-12-31 (×2): qty 1

## 2018-12-31 MED ORDER — SODIUM CHLORIDE 0.9 % IV SOLN
INTRAVENOUS | Status: DC | PRN
  Administered 2018-12-31: 500 mL

## 2018-12-31 MED ORDER — ACETAMINOPHEN 325 MG PO TABS: 650.0000 mg | ORAL_TABLET | ORAL | Status: DC | PRN

## 2018-12-31 MED ORDER — PROPOFOL 10 MG/ML IV BOLUS
INTRAVENOUS | Status: DC | PRN
  Administered 2018-12-31: 150 ug/kg/min via INTRAVENOUS
  Administered 2018-12-31: 130 mg via INTRAVENOUS

## 2018-12-31 MED ORDER — CEFAZOLIN SODIUM-DEXTROSE 2-4 GM/100ML-% IV SOLN
2.0000 g | Freq: Three times a day (TID) | INTRAVENOUS | Status: AC
  Administered 2018-12-31 – 2019-01-01 (×2): 2 g via INTRAVENOUS
  Filled 2018-12-31 (×2): qty 100

## 2018-12-31 MED ORDER — LACTATED RINGERS IV SOLN: INTRAVENOUS | Status: DC | PRN

## 2018-12-31 MED ORDER — MORPHINE SULFATE (PF) 2 MG/ML IV SOLN
2.0000 mg | INTRAVENOUS | Status: DC | PRN
  Administered 2018-12-31: 18:00:00 2 mg via INTRAVENOUS
  Filled 2018-12-31: qty 1

## 2018-12-31 MED ORDER — DEXAMETHASONE SODIUM PHOSPHATE 10 MG/ML IJ SOLN
INTRAMUSCULAR | Status: AC
  Filled 2018-12-31: qty 1

## 2018-12-31 MED ORDER — SENNA 8.6 MG PO TABS
1.0000 | ORAL_TABLET | Freq: Two times a day (BID) | ORAL | Status: DC
  Administered 2018-12-31: 22:00:00 8.6 mg via ORAL
  Filled 2018-12-31: qty 1

## 2018-12-31 MED ORDER — MIDAZOLAM HCL 2 MG/2ML IJ SOLN
INTRAMUSCULAR | Status: AC
  Filled 2018-12-31: qty 2

## 2018-12-31 MED ORDER — SODIUM CHLORIDE 0.9% FLUSH: 3.0000 mL | INTRAVENOUS | Status: DC | PRN

## 2018-12-31 MED ORDER — DIPHENHYDRAMINE HCL 50 MG/ML IJ SOLN
INTRAMUSCULAR | Status: AC
  Filled 2018-12-31: qty 1

## 2018-12-31 MED ORDER — OXYCODONE HCL 5 MG PO TABS
5.0000 mg | ORAL_TABLET | ORAL | Status: DC | PRN
  Administered 2018-12-31 – 2019-01-01 (×4): 5 mg via ORAL
  Filled 2018-12-31 (×4): qty 1

## 2018-12-31 MED ORDER — ALPRAZOLAM 0.5 MG PO TABS
0.5000 mg | ORAL_TABLET | Freq: Two times a day (BID) | ORAL | Status: DC | PRN
  Administered 2018-12-31: 22:00:00 0.5 mg via ORAL
  Filled 2018-12-31: qty 1

## 2018-12-31 MED ORDER — LIDOCAINE 2% (20 MG/ML) 5 ML SYRINGE
INTRAMUSCULAR | Status: DC | PRN
  Administered 2018-12-31: 60 mg via INTRAVENOUS

## 2018-12-31 MED ORDER — CELECOXIB 200 MG PO CAPS
200.0000 mg | ORAL_CAPSULE | Freq: Two times a day (BID) | ORAL | Status: DC
  Administered 2018-12-31: 200 mg via ORAL
  Filled 2018-12-31: qty 1

## 2018-12-31 MED ORDER — METHOCARBAMOL 1000 MG/10ML IJ SOLN
500.0000 mg | Freq: Four times a day (QID) | INTRAVENOUS | Status: DC | PRN
  Filled 2018-12-31: qty 5

## 2018-12-31 MED ORDER — DEXAMETHASONE SODIUM PHOSPHATE 10 MG/ML IJ SOLN
10.0000 mg | Freq: Once | INTRAMUSCULAR | Status: AC
  Administered 2018-12-31: 10 mg via INTRAVENOUS
  Filled 2018-12-31: qty 1

## 2018-12-31 MED ORDER — ARTHREX ANGEL - ACD-A SOLUTION (CHARTING ONLY) OPTIME
TOPICAL | Status: DC | PRN
  Administered 2018-12-31: 10 mL via TOPICAL

## 2018-12-31 MED ORDER — CLONIDINE HCL 0.1 MG PO TABS: 0.1000 mg | ORAL_TABLET | Freq: Every day | ORAL | Status: DC

## 2018-12-31 MED ORDER — CHLORHEXIDINE GLUCONATE CLOTH 2 % EX PADS: 6.0000 | MEDICATED_PAD | Freq: Once | CUTANEOUS | Status: DC

## 2018-12-31 MED ORDER — SUGAMMADEX SODIUM 200 MG/2ML IV SOLN
INTRAVENOUS | Status: DC | PRN
  Administered 2018-12-31: 160 mg via INTRAVENOUS

## 2018-12-31 MED ORDER — ONDANSETRON HCL 4 MG/2ML IJ SOLN: 4.0000 mg | Freq: Four times a day (QID) | INTRAMUSCULAR | Status: DC | PRN

## 2018-12-31 MED ORDER — METHOCARBAMOL 500 MG PO TABS
500.0000 mg | ORAL_TABLET | Freq: Four times a day (QID) | ORAL | Status: DC | PRN
  Administered 2018-12-31 – 2019-01-01 (×2): 500 mg via ORAL
  Filled 2018-12-31 (×2): qty 1

## 2018-12-31 MED ORDER — THROMBIN 5000 UNITS EX SOLR
CUTANEOUS | Status: AC
  Filled 2018-12-31: qty 5000

## 2018-12-31 MED ORDER — OXYCODONE HCL 5 MG PO TABS
ORAL_TABLET | ORAL | Status: AC
  Filled 2018-12-31: qty 1

## 2018-12-31 MED ORDER — FENTANYL CITRATE (PF) 100 MCG/2ML IJ SOLN
INTRAMUSCULAR | Status: AC
  Filled 2018-12-31: qty 2

## 2018-12-31 MED ORDER — ROCURONIUM BROMIDE 10 MG/ML (PF) SYRINGE
PREFILLED_SYRINGE | INTRAVENOUS | Status: DC | PRN
  Administered 2018-12-31: 60 mg via INTRAVENOUS

## 2018-12-31 MED ORDER — ONDANSETRON HCL 4 MG/2ML IJ SOLN
INTRAMUSCULAR | Status: AC
  Filled 2018-12-31: qty 2

## 2018-12-31 MED ORDER — LIDOCAINE 2% (20 MG/ML) 5 ML SYRINGE
INTRAMUSCULAR | Status: AC
  Filled 2018-12-31: qty 5

## 2018-12-31 MED ORDER — BUPIVACAINE HCL (PF) 0.25 % IJ SOLN
INTRAMUSCULAR | Status: AC
  Filled 2018-12-31: qty 30

## 2018-12-31 MED ORDER — MAGNESIUM OXIDE 400 (241.3 MG) MG PO TABS: 400.0000 mg | ORAL_TABLET | Freq: Every day | ORAL | Status: DC

## 2018-12-31 MED ORDER — ONDANSETRON HCL 4 MG/2ML IJ SOLN
INTRAMUSCULAR | Status: DC | PRN
  Administered 2018-12-31: 4 mg via INTRAVENOUS

## 2018-12-31 MED ORDER — OXYCODONE HCL 5 MG PO TABS
5.0000 mg | ORAL_TABLET | Freq: Once | ORAL | Status: AC | PRN
  Administered 2018-12-31: 16:00:00 5 mg via ORAL

## 2018-12-31 MED ORDER — BUPIVACAINE HCL (PF) 0.25 % IJ SOLN
INTRAMUSCULAR | Status: DC | PRN
  Administered 2018-12-31: 4 mL

## 2018-12-31 MED ORDER — INSULIN ASPART 100 UNIT/ML ~~LOC~~ SOLN
0.0000 [IU] | Freq: Three times a day (TID) | SUBCUTANEOUS | Status: DC
  Administered 2018-12-31: 18:00:00 2 [IU] via SUBCUTANEOUS

## 2018-12-31 MED ORDER — SODIUM CHLORIDE 0.9% FLUSH
3.0000 mL | Freq: Two times a day (BID) | INTRAVENOUS | Status: DC
  Administered 2018-12-31: 3 mL via INTRAVENOUS

## 2018-12-31 MED ORDER — THROMBIN 20000 UNITS EX SOLR
CUTANEOUS | Status: DC | PRN
  Administered 2018-12-31: 20 mL via TOPICAL

## 2018-12-31 MED ORDER — HEPARIN SODIUM (PORCINE) 1000 UNIT/ML IJ SOLN
INTRAMUSCULAR | Status: DC | PRN
  Administered 2018-12-31: 5000 [IU] via INTRAVENOUS

## 2018-12-31 MED ORDER — ONDANSETRON HCL 4 MG PO TABS: 4.0000 mg | ORAL_TABLET | Freq: Four times a day (QID) | ORAL | Status: DC | PRN

## 2018-12-31 MED ORDER — HEPARIN SODIUM (PORCINE) 1000 UNIT/ML IJ SOLN
INTRAMUSCULAR | Status: AC
  Filled 2018-12-31: qty 1

## 2018-12-31 MED ORDER — PHENYLEPHRINE HCL-NACL 10-0.9 MG/250ML-% IV SOLN
INTRAVENOUS | Status: DC | PRN
  Administered 2018-12-31: 50 ug/min via INTRAVENOUS

## 2018-12-31 MED ORDER — MIDAZOLAM HCL 2 MG/2ML IJ SOLN
INTRAMUSCULAR | Status: DC | PRN
  Administered 2018-12-31: 2 mg via INTRAVENOUS

## 2018-12-31 MED ORDER — PHENOL 1.4 % MT LIQD: 1.0000 | OROMUCOSAL | Status: DC | PRN

## 2018-12-31 MED ORDER — SODIUM CHLORIDE 0.9 % IV SOLN: 250.0000 mL | INTRAVENOUS | Status: DC

## 2018-12-31 MED ORDER — LACTATED RINGERS IV SOLN
INTRAVENOUS | Status: DC
  Administered 2018-12-31 (×3): via INTRAVENOUS

## 2018-12-31 MED ORDER — FENTANYL CITRATE (PF) 250 MCG/5ML IJ SOLN
INTRAMUSCULAR | Status: DC | PRN
  Administered 2018-12-31: 100 ug via INTRAVENOUS
  Administered 2018-12-31: 25 ug via INTRAVENOUS

## 2018-12-31 MED ORDER — OXYCODONE HCL 5 MG/5ML PO SOLN: 5.0000 mg | Freq: Once | ORAL | Status: AC | PRN

## 2018-12-31 MED ORDER — ONDANSETRON HCL 4 MG/2ML IJ SOLN: 4.0000 mg | Freq: Once | INTRAMUSCULAR | Status: DC | PRN

## 2018-12-31 MED ORDER — ROCURONIUM BROMIDE 10 MG/ML (PF) SYRINGE
PREFILLED_SYRINGE | INTRAVENOUS | Status: AC
  Filled 2018-12-31: qty 10

## 2018-12-31 MED ORDER — PHENYLEPHRINE 40 MCG/ML (10ML) SYRINGE FOR IV PUSH (FOR BLOOD PRESSURE SUPPORT)
PREFILLED_SYRINGE | INTRAVENOUS | Status: DC | PRN
  Administered 2018-12-31: 40 ug via INTRAVENOUS
  Administered 2018-12-31: 80 ug via INTRAVENOUS
  Administered 2018-12-31: 40 ug via INTRAVENOUS

## 2018-12-31 MED ORDER — POTASSIUM CHLORIDE IN NACL 20-0.9 MEQ/L-% IV SOLN: INTRAVENOUS | Status: DC

## 2018-12-31 MED ORDER — 0.9 % SODIUM CHLORIDE (POUR BTL) OPTIME
TOPICAL | Status: DC | PRN
  Administered 2018-12-31: 14:00:00 1000 mL

## 2018-12-31 MED ORDER — ACETAMINOPHEN 650 MG RE SUPP: 650.0000 mg | RECTAL | Status: DC | PRN

## 2018-12-31 MED ORDER — FENTANYL CITRATE (PF) 250 MCG/5ML IJ SOLN
INTRAMUSCULAR | Status: AC
  Filled 2018-12-31: qty 5

## 2018-12-31 MED ORDER — CEFAZOLIN SODIUM-DEXTROSE 2-4 GM/100ML-% IV SOLN
2.0000 g | INTRAVENOUS | Status: AC
  Administered 2018-12-31: 2 g via INTRAVENOUS
  Filled 2018-12-31: qty 100

## 2018-12-31 MED ORDER — FENTANYL CITRATE (PF) 100 MCG/2ML IJ SOLN
25.0000 ug | INTRAMUSCULAR | Status: DC | PRN
  Administered 2018-12-31 (×2): 50 ug via INTRAVENOUS

## 2018-12-31 MED ORDER — DIPHENHYDRAMINE HCL 50 MG/ML IJ SOLN
INTRAMUSCULAR | Status: DC | PRN
  Administered 2018-12-31: 6.25 mg via INTRAVENOUS

## 2018-12-31 MED ORDER — IRBESARTAN 75 MG PO TABS
75.0000 mg | ORAL_TABLET | Freq: Every day | ORAL | Status: DC
  Filled 2018-12-31 (×2): qty 1

## 2018-12-31 MED ORDER — MENTHOL 3 MG MT LOZG: 1.0000 | LOZENGE | OROMUCOSAL | Status: DC | PRN

## 2018-12-31 MED ORDER — THROMBIN 20000 UNITS EX SOLR
CUTANEOUS | Status: AC
  Filled 2018-12-31: qty 20000

## 2018-12-31 SURGICAL SUPPLY — 69 items
BAG DECANTER FOR FLEXI CONT (MISCELLANEOUS) ×3 IMPLANT
BASKET BONE COLLECTION (BASKET) ×3 IMPLANT
BENZOIN TINCTURE PRP APPL 2/3 (GAUZE/BANDAGES/DRESSINGS) ×3 IMPLANT
BLADE CLIPPER SURG (BLADE) IMPLANT
BUR MATCHSTICK NEURO 3.0 LAGG (BURR) ×6 IMPLANT
CANISTER SUCT 3000ML PPV (MISCELLANEOUS) ×3 IMPLANT
CARTRIDGE OIL MAESTRO DRILL (MISCELLANEOUS) IMPLANT
CLOSURE WOUND 1/2 X4 (GAUZE/BANDAGES/DRESSINGS) ×1
CONT SPEC 4OZ CLIKSEAL STRL BL (MISCELLANEOUS) ×3 IMPLANT
COVER BACK TABLE 60X90IN (DRAPES) ×3 IMPLANT
COVER WAND RF STERILE (DRAPES) IMPLANT
DERMABOND ADVANCED (GAUZE/BANDAGES/DRESSINGS) ×2
DERMABOND ADVANCED .7 DNX12 (GAUZE/BANDAGES/DRESSINGS) ×1 IMPLANT
DIFFUSER DRILL AIR PNEUMATIC (MISCELLANEOUS) IMPLANT
DRAPE C-ARM 42X72 X-RAY (DRAPES) ×3 IMPLANT
DRAPE LAPAROTOMY 100X72X124 (DRAPES) ×3 IMPLANT
DRAPE SURG 17X23 STRL (DRAPES) ×3 IMPLANT
DRSG OPSITE POSTOP 4X6 (GAUZE/BANDAGES/DRESSINGS) ×3 IMPLANT
DURAPREP 26ML APPLICATOR (WOUND CARE) ×3 IMPLANT
ELECT REM PT RETURN 9FT ADLT (ELECTROSURGICAL) ×3
ELECTRODE REM PT RTRN 9FT ADLT (ELECTROSURGICAL) ×1 IMPLANT
EVACUATOR 1/8 PVC DRAIN (DRAIN) IMPLANT
GAUZE 4X4 16PLY RFD (DISPOSABLE) IMPLANT
GLOVE BIO SURGEON STRL SZ7 (GLOVE) ×3 IMPLANT
GLOVE BIO SURGEON STRL SZ8 (GLOVE) ×6 IMPLANT
GLOVE BIOGEL PI IND STRL 6.5 (GLOVE) ×1 IMPLANT
GLOVE BIOGEL PI IND STRL 7.0 (GLOVE) ×1 IMPLANT
GLOVE BIOGEL PI IND STRL 8 (GLOVE) ×4 IMPLANT
GLOVE BIOGEL PI INDICATOR 6.5 (GLOVE) ×2
GLOVE BIOGEL PI INDICATOR 7.0 (GLOVE) ×2
GLOVE BIOGEL PI INDICATOR 8 (GLOVE) ×8
GLOVE ECLIPSE 7.5 STRL STRAW (GLOVE) ×9 IMPLANT
GLOVE SURG SS PI 6.0 STRL IVOR (GLOVE) ×3 IMPLANT
GOWN STRL REUS W/ TWL LRG LVL3 (GOWN DISPOSABLE) ×2 IMPLANT
GOWN STRL REUS W/ TWL XL LVL3 (GOWN DISPOSABLE) ×1 IMPLANT
GOWN STRL REUS W/TWL 2XL LVL3 (GOWN DISPOSABLE) ×6 IMPLANT
GOWN STRL REUS W/TWL LRG LVL3 (GOWN DISPOSABLE) ×4
GOWN STRL REUS W/TWL XL LVL3 (GOWN DISPOSABLE) ×2
HEMOSTAT POWDER KIT SURGIFOAM (HEMOSTASIS) ×3 IMPLANT
KIT BASIN OR (CUSTOM PROCEDURE TRAY) ×3 IMPLANT
KIT BONE MRW ASP ANGEL CPRP (KITS) ×3 IMPLANT
KIT TURNOVER KIT B (KITS) ×3 IMPLANT
MILL MEDIUM DISP (BLADE) IMPLANT
NEEDLE HYPO 18GX1.5 BLUNT FILL (NEEDLE) ×6 IMPLANT
NEEDLE HYPO 25X1 1.5 SAFETY (NEEDLE) ×3 IMPLANT
NS IRRIG 1000ML POUR BTL (IV SOLUTION) ×3 IMPLANT
OIL CARTRIDGE MAESTRO DRILL (MISCELLANEOUS)
PACK LAMINECTOMY NEURO (CUSTOM PROCEDURE TRAY) ×3 IMPLANT
PAD ARMBOARD 7.5X6 YLW CONV (MISCELLANEOUS) ×15 IMPLANT
PUTTY DBM ALLOSYNC PURE 10CC (Putty) ×3 IMPLANT
ROD LORD LIPPED TI 5.5X35 (Rod) ×6 IMPLANT
SCREW CANC SHANK MOD 6.5X35 (Screw) ×6 IMPLANT
SCREW CORT SHANK MOD 6.5X40 (Screw) ×6 IMPLANT
SCREW POLYAXIAL TULIP (Screw) ×12 IMPLANT
SET SCREW (Screw) ×8 IMPLANT
SET SCREW SPNE (Screw) ×4 IMPLANT
SPACER IDENTITI PS 11X9X25 10D (Spacer) ×6 IMPLANT
SPONGE LAP 4X18 RFD (DISPOSABLE) IMPLANT
SPONGE SURGIFOAM ABS GEL 100 (HEMOSTASIS) ×3 IMPLANT
STRIP CLOSURE SKIN 1/2X4 (GAUZE/BANDAGES/DRESSINGS) ×2 IMPLANT
SUT VIC AB 0 CT1 18XCR BRD8 (SUTURE) ×2 IMPLANT
SUT VIC AB 0 CT1 8-18 (SUTURE) ×4
SUT VIC AB 2-0 CP2 18 (SUTURE) ×3 IMPLANT
SUT VIC AB 3-0 SH 8-18 (SUTURE) ×6 IMPLANT
SYR CONTROL 10ML LL (SYRINGE) ×3 IMPLANT
TOWEL GREEN STERILE (TOWEL DISPOSABLE) ×3 IMPLANT
TOWEL GREEN STERILE FF (TOWEL DISPOSABLE) ×3 IMPLANT
TRAY FOLEY MTR SLVR 16FR STAT (SET/KITS/TRAYS/PACK) ×3 IMPLANT
WATER STERILE IRR 1000ML POUR (IV SOLUTION) ×3 IMPLANT

## 2018-12-31 NOTE — Progress Notes (Signed)
Orthopedic Tech Progress Note Patient Details:  Vicki [name] 1950-02-16 [id number] Called in order to [employer] for a LSO/QUIK DRAW brace. Patient ID: Vicki [name], female   DOB: 02/21/1950, 68 y.o.   MRN: [id number]   [name] 12/31/2018, 4:52 PM

## 2018-12-31 NOTE — H&P (Signed)
Subjective: Patient is a 68 y.o. female admitted for back and leg pain. Onset of symptoms was several years ago, gradually worsening since that time.  The pain is rated severe, and is located at the across the lower back and radiates to LLE. The pain is described as aching and occurs all day. The symptoms have been progressive. Symptoms are exacerbated by exercise. MRI or CT showed stenosis with spondylolisthesis L4-5   Past Medical History:  Diagnosis Date  . Alcohol intoxication ([place]) 05/2014    ED note   . Anxiety   . Arthritis   . Boils   . Chronic back pain   . Community acquired pneumonia 01/05/2016  . Depression   . Fibromyalgia   . GERD (gastroesophageal reflux disease)   . Heart attack ([place])   . Hepatic cyst 01/06/2016  . Hyperlipidemia   . Hypertension   . Insomnia   . Leg cramps   . Migraines    with aura  . 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 Osteopenia   . PONV (postoperative nausea and vomiting)    severe  . Postmenopausal bleeding 12/2015  . PTSD (post-traumatic stress disorder)   . S/P epidural steroid injection   . 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 Spinal stenosis   . 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 Strep throat 01/05/2016  . Stroke [facility])    3 strokes, no residual    Past Surgical History:  Procedure Laterality Date  . BREAST ENHANCEMENT SURGERY    . CARDIAC CATHETERIZATION N/A 08/12/2015   Procedure: Left Heart Cath and Coronary Angiography;  Surgeon: Jettie Booze, MD;  Location: [facility];  Service: Cardiovascular;  Laterality: N/A;  . COLONOSCOPY    . DILATATION & CURETTAGE/HYSTEROSCOPY WITH MYOSURE N/A 02/22/2016   Procedure: DILATATION & CURETTAGE/HYSTEROSCOPY WITH MYOSURE;  Surgeon: Nunzio Cobbs, MD;  Location: [facility];  Service: Gynecology;  Laterality: N/A;  . FACIAL COSMETIC SURGERY    .  HAND SURGERY     nerve & tendon repair  . LIPOSUCTION    . LUMBAR EPIDURAL INJECTION  06/16/2015  . SKIN BIOPSY Right 02/22/2016   Procedure: BIOPSY SKIN;  Surgeon: Nunzio Cobbs, MD;  Location: [facility];  Service: Gynecology;  Laterality: Right;  . SQUAMOUS CELL CARCINOMA EXCISION    . WISDOM TOOTH EXTRACTION      Prior to Admission medications   Medication Sig Start Date End Date Taking? Authorizing Provider  ALPRAZolam (XANAX) 0.5 MG tablet TAKE ONE-HALF TO ONE TABLET BY MOUTH TWICE DAILY AS NEEDED FOR ANXIETY Patient taking differently: Take 0.25-0.5 mg by mouth 2 (two) times daily as needed for anxiety.  12/03/18  Yes Cottle, Billey Co., MD  cloNIDine (CATAPRES) 0.1 MG tablet Take 1 tablet (0.1 mg total) by mouth daily. 03/21/18  Yes Cottle, Billey Co., MD  magnesium oxide (MAG-OX) 400 MG tablet Take 400 mg by mouth daily.   Yes [provider]  QUEtiapine (SEROQUEL) 300 MG tablet Take 1 tablet (300 mg total) by mouth at bedtime. 09/05/18  Yes Cottle, Billey Co., MD  telmisartan (MICARDIS) 40 MG tablet Take 40 mg by mouth daily.  09/11/17  Yes [provider]  zolpidem (AMBIEN) 5 MG tablet Take 1 tablet (5 mg total) by  mouth at bedtime as needed for sleep. 09/05/18  Yes Cottle, Billey Co., MD  ibuprofen (ADVIL,MOTRIN) 800 MG tablet Take 1 tablet (800 mg total) by mouth every 8 (eight) hours as needed. Patient not taking: Reported on 10/02/2018 02/22/16   Nunzio Cobbs, MD  metFORMIN (GLUCOPHAGE) 500 MG tablet Take 1 tablet (500 mg total) by mouth 2 (two) times daily with a meal. 09/05/18   Cottle, Billey Co., MD  sertraline (ZOLOFT) 100 MG tablet Take 2.5 tablets (250 mg total) by mouth daily. Patient not taking: Reported on 12/25/2018 09/05/18   Cottle, Billey Co., MD   Allergies  Allergen Reactions  . Codeine Other (See Comments)    headache  . Crestor [Rosuvastatin] Other (See Comments)    Muscle aches  . Other Nausea And Vomiting     Anesthesia-severe vomiting  . Simvastatin Other (See Comments)    Muscle aches  . Statins     Social History   Tobacco Use  . Smoking status: Former Research scientist (life sciences)  . Smokeless tobacco: Never Used  Substance Use Topics  . Alcohol use: No    Family History  Problem Relation Age of Onset  . Stroke Father   . Depression Father   . Stroke Mother   . Hypertension Brother   . Heart disease Brother        stint...PACER  . Heart attack Brother   . CAD Brother   . Heart attack Maternal Uncle   . Heart attack Maternal Grandmother   . Heart attack Brother   . Non-Hodgkin's lymphoma Brother   . CAD Brother   . Heart attack Maternal Uncle   . Heart attack Maternal Uncle   . Heart attack Maternal Uncle   . Heart attack Maternal Uncle   . Cholesteatoma Sister   . Hyperlipidemia Sister   . Other Brother        SUICIDE 05/2016     Review of Systems  Positive ROS: neg  All other systems have been reviewed and were otherwise negative with the exception of those mentioned in the HPI and as above.  Objective: Vital signs in last 24 hours: Temp:  [98 F (36.7 C)] 98 F (36.7 C) (12/14 0926) Pulse Rate:  [93] 93 (12/14 0926) Resp:  [18] 18 (12/14 0926) BP: (103)/(79) 103/79 (12/14 0926) SpO2:  [100 %] 100 % (12/14 0926)  General Appearance: Alert, cooperative, no distress, appears stated age Head: Normocephalic, without obvious abnormality, atraumatic Eyes: PERRL, conjunctiva/corneas clear, EOM's intact    Neck: Supple, symmetrical, trachea midline Back: Symmetric, no curvature, ROM normal, no CVA tenderness Lungs:  respirations unlabored Heart: Regular rate and rhythm Abdomen: Soft, non-tender Extremities: Extremities normal, atraumatic, no cyanosis or edema Pulses: 2+ and symmetric all extremities Skin: Skin color, texture, turgor normal, no rashes or lesions  NEUROLOGIC:   Mental status: Alert and oriented x4,  no aphasia, good attention span, fund of knowledge, and  memory Motor Exam - grossly normal Sensory Exam - grossly normal Reflexes: 1+ Coordination - grossly normal Gait - grossly normal Balance - grossly normal Cranial Nerves: I: smell Not tested  II: visual acuity  OS: nl    OD: nl  II: visual fields Full to confrontation  II: pupils Equal, round, reactive to light  III,VII: ptosis None  III,IV,VI: extraocular muscles  Full ROM  V: mastication Normal  V: facial light touch sensation  Normal  V,VII: corneal reflex  Present  VII: facial muscle function - upper  Normal  VII: facial muscle function - lower Normal  VIII: hearing Not tested  IX: soft palate elevation  Normal  IX,X: gag reflex Present  XI: trapezius strength  5/5  XI: sternocleidomastoid strength 5/5  XI: neck flexion strength  5/5  XII: tongue strength  Normal    Data Review Lab Results  Component Value Date   WBC 8.9 12/27/2018   HGB 15.2 (H) 12/27/2018   HCT 46.2 (H) 12/27/2018   MCV 91.8 12/27/2018   PLT 259 12/27/2018   Lab Results  Component Value Date   NA 137 12/27/2018   K 4.8 12/27/2018   CL 104 12/27/2018   CO2 24 12/27/2018   BUN 16 12/27/2018   CREATININE 0.83 12/27/2018   GLUCOSE 112 (H) 12/27/2018   Lab Results  Component Value Date   INR 0.92 08/12/2015    Assessment/Plan:  Estimated body mass index is 34.59 kg/m as calculated from the following:   Height as of this encounter: 5\' 3"  (1.6 m).   Weight as of 12/27/18: 88.6 kg. Patient admitted for plif L4-5. Patient has failed a reasonable attempt at conservative therapy.  I explained the condition and procedure to the patient and answered any questions.  Patient wishes to proceed with procedure as planned. Understands risks/ benefits and typical outcomes of procedure.   Eustace Moore 12/31/2018 9:53 AM

## 2018-12-31 NOTE — Op Note (Signed)
12/31/2018  3:01 PM  PATIENT:  Vicki [name]  68 y.o. 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 Decompressive lumbar laminectomy L4-5 requiring more work than would be required for a simple exposure of the disk for PLIF in order to adequately decompress the neural elements and address the spinal stenosis 2. Posterior lumbar interbody fusion L4-5 using 11 mm porous titanium interbody cages packed with morcellized allograft and autograft soaked with a bone marrow aspirate obtained through a separate fascial incision over the right iliac crest 3. Posterior fixation L4-5 using Alphatec cortical pedicle screws.  4. Intertransverse arthrodesis L4-5 using morcellized autograft and allograft.  SURGEON:  Sherley Bounds, MD  ASSISTANTS: Glenford Peers, FNP  ANESTHESIA:  General  EBL: 100 ml  Total I/O In: 2000 [I.V.:2000] Out: 150 [Urine:50; Blood:100]  BLOOD ADMINISTERED:none  DRAINS: none   INDICATION FOR PROCEDURE: This patient presented with back and leg pain. Imaging revealed dynamic spondylolisthesis L4-5 with severe spinal stenosis. The patient tried a reasonable attempt at conservative medical measures without relief. I recommended decompression and instrumented fusion to address the stenosis as well as the segmental  instability.  Patient understood the risks, benefits, and alternatives and potential outcomes and wished to proceed.  PROCEDURE DETAILS:  The patient was brought to the operating room. After induction of generalized endotracheal anesthesia the patient was rolled into the prone position on chest rolls and all pressure points were padded. The patient's lumbar region was cleaned and then prepped with DuraPrep and draped in the usual sterile fashion. Anesthesia was injected and then a dorsal midline incision was made and carried down to the lumbosacral  fascia. The fascia was opened and the paraspinous musculature was taken down in a subperiosteal fashion to expose L4-5. A self-retaining retractor was placed. Intraoperative fluoroscopy confirmed my level, and I started with placement of the L4 cortical pedicle screws. The pedicle screw entry zones were identified utilizing surface landmarks and  AP and lateral fluoroscopy. I scored the cortex with the high-speed drill and then used the hand drill to drill an upward and outward direction into the pedicle. I then tapped line to line. I then placed a 6.5 x 40 mm cortical pedicle screw into the pedicles of L4 bilaterally.  I then dissected in a suprafascial plane to expose the iliac crest.  Opened the fascia and we used a Jamshidi needle to extract 60 cc of bone marrow aspirate from the iliac crest with the assistance of my nurse practitioner.  This was then spun down by [facility] device and 2 to 4 cc of  BMAC was soaked on morselized allograft for later arthrodesis.  I dried the hole with Surgifoam and closed the fascia.  I then turned my attention to the decompression and complete lumbar laminectomies, hemi- facetectomies, and foraminotomies were performed at L4-5.  My nurse practitioner was directly involved in the decompression and exposure of the neural elements. the patient had significant spinal stenosis and this required more work than would be required for a simple exposure of the disc for posterior lumbar interbody fusion which would only require a limited laminotomy. Much more generous decompression and generous foraminotomy was undertaken in order to adequately decompress the neural elements and address the patient's leg pain. The yellow ligament was removed to expose the underlying dura and nerve roots, and generous foraminotomies were performed to adequately decompress the neural elements. Both the exiting  and traversing nerve roots were decompressed on both sides until a coronary dilator passed easily along  the nerve roots. Once the decompression was complete, I turned my attention to the posterior lower lumbar interbody fusion. The epidural venous vasculature was coagulated and cut sharply. Disc space was incised and the initial discectomy was performed with pituitary rongeurs. The disc space was distracted with sequential distractors to a height of 11 mm. We then used a series of scrapers and shavers to prepare the endplates for fusion. The midline was prepared with Epstein curettes. Once the complete discectomy was finished, we packed an appropriate sized interbody cage with local autograft and morcellized allograft, gently retracted the nerve root, and tapped the cage into position at L4-5.  The midline between the cages was packed with morselized autograft and allograft. We then turned our attention to the placement of the lower pedicle screws. The pedicle screw entry zones were identified utilizing surface landmarks and fluoroscopy. I drilled into each pedicle utilizing the hand drill, and tapped each pedicle with the appropriate tap. We palpated with a ball probe to assure no break in the cortex. We then placed 6.5 x 40 mm cortical pedicle screws into the pedicles bilaterally at L5.  My nurse practitioner assisted in placement of the pedicle screws.  We then decorticated the transverse processes and laid a mixture of morcellized autograft and allograft out over these to perform intertransverse arthrodesis at L4-5. We then placed lordotic rods into the multiaxial screw heads of the pedicle screws and locked these in position with the locking caps and anti-torque device. We then checked our construct with AP and lateral fluoroscopy. Irrigated with copious amounts of bacitracin-containing saline solution. Inspected the nerve roots once again to assure adequate decompression, lined to the dura with Gelfoam, placed powdered vancomycin into the wound, and then we closed the muscle and the fascia with 0 Vicryl. Closed  the subcutaneous tissues with 2-0 Vicryl and subcuticular tissues with 3-0 Vicryl. The skin was closed with benzoin and Steri-Strips. Dressing was then applied, the patient was awakened from general anesthesia and transported to the recovery room in stable condition. At the end of the procedure all sponge, needle and instrument counts were correct.   PLAN OF CARE: admit to inpatient  PATIENT DISPOSITION:  PACU - hemodynamically stable.   Delay start of Pharmacological VTE agent (>24hrs) due to surgical blood loss or risk of bleeding:  yes

## 2018-12-31 NOTE — Anesthesia Procedure Notes (Signed)
Procedure Name: Intubation Date/Time: 12/31/2018 12:24 PM Performed by: Colin Benton, CRNA Pre-anesthesia Checklist: Patient identified, Emergency Drugs available, Suction available and Patient being monitored Patient Re-evaluated:Patient Re-evaluated prior to induction Oxygen Delivery Method: Circle system utilized Preoxygenation: Pre-oxygenation with 100% oxygen Induction Type: IV induction Ventilation: Mask ventilation without difficulty Laryngoscope Size: Miller and 2 Grade View: Grade I Tube type: Oral Tube size: 7.0 mm Number of attempts: 1 Airway Equipment and Method: Stylet Placement Confirmation: ETT inserted through vocal cords under direct vision,  positive ETCO2 and breath sounds checked- equal and bilateral Secured at: 21 cm Tube secured with: Tape Dental Injury: Teeth and Oropharynx as per pre-operative assessment

## 2018-12-31 NOTE — Transfer of Care (Signed)
Immediate Anesthesia Transfer of Care Note  Patient: Vicki [name]  Procedure(s) Performed: POSTERIOR LUMBAR INTERBODY FUSION LUMBAR FOUR-FIVE. (N/A Spine Lumbar)  Patient Location: PACU  Anesthesia Type:General  Level of Consciousness: awake, alert , oriented and patient cooperative  Airway & Oxygen Therapy: Patient Spontanous Breathing and Patient connected to nasal cannula oxygen  Post-op Assessment: Report given to RN and Post -op Vital signs reviewed and stable  Post vital signs: Reviewed and stable  Last Vitals:  Vitals Value Taken Time  BP 100/56 12/31/18 1520  Temp    Pulse 86 12/31/18 1522  Resp 15 12/31/18 1522  SpO2 95 % 12/31/18 1522  Vitals shown include unvalidated device data.  Last Pain:  Vitals:   12/31/18 1015  PainSc: 4          Complications: No apparent anesthesia complications

## 2018-12-31 NOTE — Anesthesia Postprocedure Evaluation (Signed)
Anesthesia Post Note  Patient: Vicki [name]  Procedure(s) Performed: POSTERIOR LUMBAR INTERBODY FUSION LUMBAR FOUR-FIVE. (N/A Spine Lumbar)     Patient location during evaluation: PACU Anesthesia Type: General Level of consciousness: awake and alert Pain management: pain level controlled (pain improving) Vital Signs Assessment: post-procedure vital signs reviewed and stable Respiratory status: spontaneous breathing, nonlabored ventilation and respiratory function stable Cardiovascular status: blood pressure returned to baseline and stable Postop Assessment: no apparent nausea or vomiting Anesthetic complications: no    Last Vitals:  Vitals:   12/31/18 1605 12/31/18 1641  BP: 106/70 102/60  Pulse: 89 77  Resp: 16 20  Temp:  36.4 C  SpO2: 96% 96%    Last Pain:  Vitals:   12/31/18 1755  TempSrc:   PainSc: 8                  Cherina Dhillon,E. Mackenna Kamer

## 2019-01-01 MED ORDER — OXYCODONE HCL 5 MG PO TABS: 5.0000 mg | ORAL_TABLET | ORAL | 0 refills | Status: DC | PRN

## 2019-01-01 NOTE — Discharge Summary (Signed)
Physician Discharge Summary  Patient ID: Vicki [name] MRN: [id number] DOB/AGE: Aug 18, 1950 68 y.o.  Admit date: 12/31/2018 Discharge date: 01/01/2019  Admission Diagnoses: spondylolisthesis with stenosis L4-5    Discharge Diagnoses: same   Discharged Condition: good  Hospital Course: The patient was admitted on 12/31/2018 and taken to the operating room where the patient underwent PLIF L4-5. The patient tolerated the procedure well and was taken to the recovery room and then to the floor in stable condition. The hospital course was routine. There were no complications. The wound remained clean dry and intact. Pt had appropriate back soreness. No complaints of leg pain or new N/T/W. The patient remained afebrile with stable vital signs, and tolerated a regular diet. The patient continued to increase activities, and pain was well controlled with oral pain medications.   Consults: None  Significant Diagnostic Studies:  Results for orders placed or performed during the hospital encounter of 12/31/18  Glucose, capillary  Result Value Ref Range   Glucose-Capillary 114 (H) 70 - 99 mg/dL   Comment 1 Notify RN    Comment 2 Document in Chart   Glucose, capillary  Result Value Ref Range   Glucose-Capillary 129 (H) 70 - 99 mg/dL    Chest 2 View  Result Date: 12/31/2018 CLINICAL DATA:  Preop.  Lumbar spondylosis EXAM: CHEST - 2 VIEW COMPARISON:  08/02/2010 FINDINGS: The heart size and mediastinal contours are within normal limits. Both lungs are clear. The visualized skeletal structures are unremarkable. IMPRESSION: No active cardiopulmonary disease. Electronically Signed   By: Kerby Moors M.D.   On: 12/31/2018 09:51   DG Lumbar Spine 2-3 Views  Result Date: 12/31/2018 CLINICAL DATA:  PLIF L4-5 EXAM: LUMBAR SPINE - 2-3 VIEW; DG C-ARM 1-60 MIN COMPARISON:  01/06/2016 FINDINGS: Two C-arm images show posterior decompression, diskectomy and fusion at the L4-5 level. Components appear well  positioned. No radiographically detectable complication. IMPRESSION: PLIF L4-5. Electronically Signed   By: Nelson Chimes M.D.   On: 12/31/2018 14:49   DG C-Arm 1-60 Min  Result Date: 12/31/2018 CLINICAL DATA:  PLIF L4-5 EXAM: LUMBAR SPINE - 2-3 VIEW; DG C-ARM 1-60 MIN COMPARISON:  01/06/2016 FINDINGS: Two C-arm images show posterior decompression, diskectomy and fusion at the L4-5 level. Components appear well positioned. No radiographically detectable complication. IMPRESSION: PLIF L4-5. Electronically Signed   By: Nelson Chimes M.D.   On: 12/31/2018 14:49    Antibiotics:  Anti-infectives (From admission, onward)   Start     Dose/Rate Route Frequency Ordered Stop   12/31/18 2000  ceFAZolin (ANCEF) IVPB 2g/100 mL premix     2 g 200 mL/hr over 30 Minutes Intravenous Every 8 hours 12/31/18 1629 01/01/19 0435   12/31/18 1355  bacitracin 50,000 Units in sodium chloride 0.9 % 500 mL irrigation  Status:  Discontinued       As needed 12/31/18 1355 12/31/18 1511   12/31/18 0930  ceFAZolin (ANCEF) IVPB 2g/100 mL premix     2 g 200 mL/hr over 30 Minutes Intravenous On call to O.R. 12/31/18 0925 12/31/18 1259      Discharge Exam: Blood pressure 109/87, pulse 90, temperature 98.7 F (37.1 C), temperature source Oral, resp. rate 19, height 5\' 3"  (1.6 m), weight 88.6 kg, last menstrual period 07/18/2011, SpO2 98 %. Neurologic: Grossly normal Dressing dry  Discharge Medications:   Allergies as of 01/01/2019      Reactions   Codeine Other (See Comments)   headache   Crestor [rosuvastatin] Other (See Comments)  Muscle aches   Other Nausea And Vomiting   Anesthesia-severe vomiting   Simvastatin Other (See Comments)   Muscle aches   Statins       Medication List    TAKE these medications   ALPRAZolam 0.5 MG tablet Commonly known as: XANAX TAKE ONE-HALF TO ONE TABLET BY MOUTH TWICE DAILY AS NEEDED FOR ANXIETY What changed: See the new instructions.   cloNIDine 0.1 MG tablet Commonly  known as: CATAPRES Take 1 tablet (0.1 mg total) by mouth daily.   ibuprofen 800 MG tablet Commonly known as: ADVIL Take 1 tablet (800 mg total) by mouth every 8 (eight) hours as needed.   magnesium oxide 400 MG tablet Commonly known as: MAG-OX Take 400 mg by mouth daily.   metFORMIN 500 MG tablet Commonly known as: GLUCOPHAGE Take 1 tablet (500 mg total) by mouth 2 (two) times daily with a meal.   oxyCODONE 5 MG immediate release tablet Commonly known as: Oxy IR/ROXICODONE Take 1 tablet (5 mg total) by mouth every 4 (four) hours as needed for moderate pain ((score 4 to 6)).   QUEtiapine 300 MG tablet Commonly known as: SEROQUEL Take 1 tablet (300 mg total) by mouth at bedtime.   sertraline 100 MG tablet Commonly known as: ZOLOFT Take 2.5 tablets (250 mg total) by mouth daily.   telmisartan 40 MG tablet Commonly known as: MICARDIS Take 40 mg by mouth daily.   zolpidem 5 MG tablet Commonly known as: AMBIEN Take 1 tablet (5 mg total) by mouth at bedtime as needed for sleep.            Durable Medical Equipment  (From admission, onward)         Start     Ordered   12/31/18 1629  DME Walker rolling  Once    Question:  Patient needs a walker to treat with the following condition  Answer:  S/P lumbar fusion   12/31/18 1629   12/31/18 1629  DME 3 n 1  Once     12/31/18 1629          Disposition: home   Final Dx: PLIF L4-5  Discharge Instructions     Remove dressing in 72 hours   Complete by: As directed    Call MD for:  difficulty breathing, headache or visual disturbances   Complete by: As directed    Call MD for:  persistant nausea and vomiting   Complete by: As directed    Call MD for:  redness, tenderness, or signs of infection (pain, swelling, redness, odor or green/yellow discharge around incision site)   Complete by: As directed    Call MD for:  severe uncontrolled pain   Complete by: As directed    Call MD for:  temperature >100.4   Complete by:  As directed    Diet - low sodium heart healthy   Complete by: As directed    Increase activity slowly   Complete by: As directed       Follow-up Information    Eustace Moore, MD. Schedule an appointment as soon as possible for a visit in 2 week(s).   Specialty: Neurosurgery Contact information: [address] [place] [place] [address] [place] [address] [phone]            Signed: Eustace Moore 01/01/2019, 8:00 AM

## 2019-01-01 NOTE — Evaluation (Signed)
Occupational Therapy Evaluation Patient Details Name: Vicki [name] MRN: [id number] DOB: 15-Aug-1950 Today's Date: 01/01/2019    History of Present Illness Pt is a 68 y.o. female s/p L4-5 PLIF.   Clinical Impression   This 68 y/o female presents with the above. PTA pt reports independence with ADL and functional mobility. Pt currently performing functional mobility without AD at supervision-minguard assist level; she currently requires minguard-minA for LB ADL, setup assist for UB ADL. Pt reports plans to return home with spouse (who was present during session) who can assist with ADL/iADL PRN. Educated pt re: back precautions, brace management, safety, AE and compensatory techniques for performing ADL and functional transfers with pt verbalizing understanding. Pt reports feeling comfortable completing necessary ADL tasks given available spouse assist. No further acute OT needs identified at this time. Pt anticipating d/c home today.     Follow Up Recommendations  No OT follow up;Supervision/Assistance - 24 hour    Equipment Recommendations  None recommended by OT(discussed obtaining shower chair)           Precautions / Restrictions Precautions Precautions: Back Precaution Booklet Issued: Yes (comment) Precaution Comments: Educated on 3/3 back precautions. Required Braces or Orthoses: Spinal Brace Spinal Brace: Lumbar corset;Applied in sitting position Restrictions Weight Bearing Restrictions: No      Mobility Bed Mobility Overal bed mobility: Modified Independent             General bed mobility comments: OOB in recliner, verbally reviewed  Transfers Overall transfer level: Modified independent Equipment used: None                  Balance Overall balance assessment: Mild deficits observed, not formally tested                                         ADL either performed or assessed with clinical judgement   ADL Overall ADL's : Needs  assistance/impaired Eating/Feeding: Modified independent;Sitting   Grooming: Min guard;Set up;Sitting;Standing   Upper Body Bathing: Set up;Min guard;Sitting   Lower Body Bathing: Minimal assistance;Sit to/from stand   Upper Body Dressing : Set up;Sitting;Standing Upper Body Dressing Details (indicate cue type and reason): reviewed brace management with pt verbalizing understanding Lower Body Dressing: Minimal assistance;Sit to/from stand Lower Body Dressing Details (indicate cue type and reason): reports spouse can assist Toilet Transfer: Min guard;Ambulation   Toileting- Clothing Manipulation and Hygiene: Min guard;Sit to/from Nurse, children's Details (indicate cue type and reason): discussed obtaining/use of shower chair during bathing task for increased safety and independence with task completion, pt/pt's spouse verbalizing understanding  Functional mobility during ADLs: Min guard General ADL Comments: reviewed back precautions, AE, safety, and compensatory techniques for performing ADL and functional transfers      Vision         Perception     Praxis      Pertinent Vitals/Pain Pain Assessment: 0-10 Pain Score: 7  Pain Location: back Pain Descriptors / Indicators: Sore Pain Intervention(s): Repositioned;Limited activity within patient's tolerance;Monitored during session     Hand Dominance Right   Extremity/Trunk Assessment Upper Extremity Assessment Upper Extremity Assessment: Overall WFL for tasks assessed   Lower Extremity Assessment Lower Extremity Assessment: Defer to PT evaluation LLE Deficits / Details: 4/5 DF   Cervical / Trunk Assessment Cervical / Trunk Assessment: Other exceptions Cervical / Trunk Exceptions: s/p PLIF   Communication  Communication Communication: No difficulties   Cognition Arousal/Alertness: Awake/alert Behavior During Therapy: WFL for tasks assessed/performed Overall Cognitive Status: Within Functional Limits  for tasks assessed                                     General Comments  VSS    Exercises     Shoulder Instructions      Home Living Family/patient expects to be discharged to:: Private residence Living Arrangements: Spouse/significant other Available Help at Discharge: Family;Available 24 hours/day Type of Home: House Home Access: Stairs to enter [employer] of Steps: 3 Entrance Stairs-Rails: Right Home Layout: One level;Laundry or work area in basement     [employer] Shower/Tub: [facility];Tub only   Bathroom Toilet: Standard     Home Equipment: None          Prior Functioning/Environment Level of Independence: Independent                 OT Problem List: Decreased range of motion;Decreased strength;Decreased activity tolerance;Decreased knowledge of use of DME or AE;Decreased knowledge of precautions;Pain      OT Treatment/Interventions:      OT Goals(Current goals can be found in the care plan section) Acute Rehab OT Goals Patient Stated Goal: home OT Goal Formulation: All assessment and education complete, DC therapy  OT Frequency:     Barriers to D/C:            Co-evaluation              AM-PAC OT "6 Clicks" Daily Activity     Outcome Measure Help from another person eating meals?: None Help from another person taking care of personal grooming?: None Help from another person toileting, which includes using toliet, bedpan, or urinal?: A Little Help from another person bathing (including washing, rinsing, drying)?: A Little Help from another person to put on and taking off regular upper body clothing?: None Help from another person to put on and taking off regular lower body clothing?: A Little 6 Click Score: 21   End of Session Equipment Utilized During Treatment: Back brace Nurse Communication: Mobility status  Activity Tolerance: Patient tolerated treatment well Patient left: in chair;with call  bell/phone within reach;with family/visitor present  OT Visit Diagnosis: Other abnormalities of gait and mobility (R26.89);Pain Pain - part of body: (back)                Time: [id number] OT Time Calculation (min): 20 min Charges:  OT General Charges $OT Visit: 1 Visit OT Evaluation $OT Eval Low Complexity: 1 Low  Lou Cal, OT [facility] Pager [phone] Office [place] 01/01/2019, 10:37 AM

## 2019-01-01 NOTE — Discharge Instructions (Signed)

## 2019-01-01 NOTE — Plan of Care (Signed)
Pt and husband given D/C instructions with verbal understanding. Rx's were sent to pharmacy by MD. Pt's incision is clean and dry with no sign of infection. Pt's IV was removed prior to D/C. Pt D/C'd home via wheelchair per MD order. Pt is stable @ D/C and has no other needs at this time. Holli Humbles, RN

## 2019-01-01 NOTE — Evaluation (Signed)
Physical Therapy Evaluation Patient Details Name: Vicki [name] MRN: [id number] DOB: 1950/05/15 Today's Date: 01/01/2019   History of Present Illness  Pt is a 68 y.o. female s/p L4-5 PLIF.  Clinical Impression  PT eval complete. Pt mod I bed mobility and transfers. Supervision provided for ambulation 250' without AD. Ascend/descend flight of stairs with R rail supervision. No c/o dizziness. No LOB noted. All education complete. PT signing off.    Follow Up Recommendations No PT follow up    Equipment Recommendations  None recommended by PT    Recommendations for Other Services       Precautions / Restrictions Precautions Precautions: Back Precaution Comments: Educated on 3/3 back precautions. Required Braces or Orthoses: Spinal Brace Spinal Brace: Lumbar corset;Applied in sitting position      Mobility  Bed Mobility Overal bed mobility: Modified Independent             General bed mobility comments: Good adherance to precautions/logroll  Transfers Overall transfer level: Modified independent Equipment used: None                Ambulation/Gait Ambulation/Gait assistance: Supervision Gait Distance (Feet): 250 Feet Assistive device: None Gait Pattern/deviations: Step-through pattern;Decreased stride length Gait velocity: decreased Gait velocity interpretation: 1.31 - 2.62 ft/sec, indicative of limited community ambulator General Gait Details: steady gait without AD  Stairs Stairs: Yes Stairs assistance: Supervision Stair Management: One rail Right;Forwards;Step to pattern Number of Stairs: 12    Wheelchair Mobility    Modified Rankin (Stroke Patients Only)       Balance Overall balance assessment: Mild deficits observed, not formally tested                                           Pertinent Vitals/Pain Pain Assessment: 0-10 Pain Score: 6  Pain Location: back Pain Descriptors / Indicators: Sore Pain Intervention(s):  Monitored during session;Repositioned    Home Living Family/patient expects to be discharged to:: Private residence Living Arrangements: Spouse/significant other Available Help at Discharge: Family;Available 24 hours/day Type of Home: House Home Access: Stairs to enter Entrance Stairs-Rails: Right Entrance Stairs-Number of Steps: 3 Home Layout: One level;Laundry or work area in [employer]: None      Prior Function Level of Independence: Independent               Hand Dominance   Dominant Hand: Right    Extremity/Trunk Assessment   Upper Extremity Assessment Upper Extremity Assessment: Defer to OT evaluation    Lower Extremity Assessment Lower Extremity Assessment: LLE deficits/detail LLE Deficits / Details: 4/5 DF    Cervical / Trunk Assessment Cervical / Trunk Assessment: Other exceptions Cervical / Trunk Exceptions: s/p PLIF  Communication   Communication: No difficulties  Cognition Arousal/Alertness: Awake/alert Behavior During Therapy: WFL for tasks assessed/performed Overall Cognitive Status: Within Functional Limits for tasks assessed                                        General Comments General comments (skin integrity, edema, etc.): VSS    Exercises     Assessment/Plan    PT Assessment Patent does not need any further PT services  PT Problem List         PT Treatment Interventions      PT Goals (  Current goals can be found in the Care Plan section)  Acute Rehab PT Goals Patient Stated Goal: home PT Goal Formulation: All assessment and education complete, DC therapy    Frequency     Barriers to discharge        Co-evaluation               AM-PAC PT "6 Clicks" Mobility  Outcome Measure Help needed turning from your back to your side while in a flat bed without using bedrails?: None Help needed moving from lying on your back to sitting on the side of a flat bed without using bedrails?: None Help  needed moving to and from a bed to a chair (including a wheelchair)?: None Help needed standing up from a chair using your arms (e.g., wheelchair or bedside chair)?: None Help needed to walk in hospital room?: None Help needed climbing 3-5 steps with a railing? : None 6 Click Score: 24    End of Session Equipment Utilized During Treatment: Back brace Activity Tolerance: Patient tolerated treatment well Patient left: in chair;with call bell/phone within reach Nurse Communication: Mobility status PT Visit Diagnosis: Difficulty in walking, not elsewhere classified (R26.2);Pain    Time: 0820-0839 PT Time Calculation (min) (ACUTE ONLY): 19 min   Charges:   PT Evaluation $PT Eval Moderate Complexity: 1 Mod          Lorrin Goodell, PT  Office # [phone] Pager [phone]   Lorriane Shire 01/01/2019, 8:58 AM

## 2019-01-07 DIAGNOSIS — E782 Mixed hyperlipidemia: Secondary | ICD-10-CM | POA: Diagnosis not present

## 2019-01-07 DIAGNOSIS — G459 Transient cerebral ischemic attack, unspecified: Secondary | ICD-10-CM | POA: Diagnosis not present

## 2019-01-07 DIAGNOSIS — I10 Essential (primary) hypertension: Secondary | ICD-10-CM | POA: Diagnosis not present

## 2019-01-07 DIAGNOSIS — E785 Hyperlipidemia, unspecified: Secondary | ICD-10-CM | POA: Diagnosis not present

## 2019-01-29 ENCOUNTER — Other Ambulatory Visit: Payer: Self-pay | Admitting: Psychiatry

## 2019-01-29 DIAGNOSIS — I10 Essential (primary) hypertension: Secondary | ICD-10-CM | POA: Diagnosis not present

## 2019-01-29 DIAGNOSIS — F5105 Insomnia due to other mental disorder: Secondary | ICD-10-CM

## 2019-01-29 DIAGNOSIS — E785 Hyperlipidemia, unspecified: Secondary | ICD-10-CM | POA: Diagnosis not present

## 2019-01-29 DIAGNOSIS — G4700 Insomnia, unspecified: Secondary | ICD-10-CM | POA: Diagnosis not present

## 2019-01-29 DIAGNOSIS — G4721 Circadian rhythm sleep disorder, delayed sleep phase type: Secondary | ICD-10-CM

## 2019-01-29 DIAGNOSIS — F419 Anxiety disorder, unspecified: Secondary | ICD-10-CM | POA: Diagnosis not present

## 2019-02-12 DIAGNOSIS — M4316 Spondylolisthesis, lumbar region: Secondary | ICD-10-CM | POA: Diagnosis not present

## 2019-02-28 DIAGNOSIS — G459 Transient cerebral ischemic attack, unspecified: Secondary | ICD-10-CM | POA: Diagnosis not present

## 2019-02-28 DIAGNOSIS — E785 Hyperlipidemia, unspecified: Secondary | ICD-10-CM | POA: Diagnosis not present

## 2019-02-28 DIAGNOSIS — I10 Essential (primary) hypertension: Secondary | ICD-10-CM | POA: Diagnosis not present

## 2019-02-28 DIAGNOSIS — E782 Mixed hyperlipidemia: Secondary | ICD-10-CM | POA: Diagnosis not present

## 2019-03-06 ENCOUNTER — Ambulatory Visit: Payer: PPO | Admitting: Psychiatry

## 2019-03-11 ENCOUNTER — Other Ambulatory Visit: Payer: Self-pay

## 2019-03-11 DIAGNOSIS — G4721 Circadian rhythm sleep disorder, delayed sleep phase type: Secondary | ICD-10-CM

## 2019-03-11 DIAGNOSIS — F5105 Insomnia due to other mental disorder: Secondary | ICD-10-CM

## 2019-03-11 MED ORDER — ZOLPIDEM TARTRATE 5 MG PO TABS: 5.0000 mg | ORAL_TABLET | Freq: Every evening | ORAL | 0 refills | Status: DC | PRN

## 2019-03-26 DIAGNOSIS — E782 Mixed hyperlipidemia: Secondary | ICD-10-CM | POA: Diagnosis not present

## 2019-03-26 DIAGNOSIS — G459 Transient cerebral ischemic attack, unspecified: Secondary | ICD-10-CM | POA: Diagnosis not present

## 2019-03-26 DIAGNOSIS — E785 Hyperlipidemia, unspecified: Secondary | ICD-10-CM | POA: Diagnosis not present

## 2019-03-26 DIAGNOSIS — G4700 Insomnia, unspecified: Secondary | ICD-10-CM | POA: Diagnosis not present

## 2019-03-26 DIAGNOSIS — I10 Essential (primary) hypertension: Secondary | ICD-10-CM | POA: Diagnosis not present

## 2019-03-28 ENCOUNTER — Ambulatory Visit (INDEPENDENT_AMBULATORY_CARE_PROVIDER_SITE_OTHER): Payer: PPO | Admitting: Psychiatry

## 2019-03-28 ENCOUNTER — Encounter: Payer: Self-pay | Admitting: Psychiatry

## 2019-03-28 DIAGNOSIS — F5105 Insomnia due to other mental disorder: Secondary | ICD-10-CM | POA: Diagnosis not present

## 2019-03-28 DIAGNOSIS — F3340 Major depressive disorder, recurrent, in remission, unspecified: Secondary | ICD-10-CM | POA: Diagnosis not present

## 2019-03-28 DIAGNOSIS — G4721 Circadian rhythm sleep disorder, delayed sleep phase type: Secondary | ICD-10-CM | POA: Diagnosis not present

## 2019-03-28 DIAGNOSIS — F4310 Post-traumatic stress disorder, unspecified: Secondary | ICD-10-CM

## 2019-03-28 DIAGNOSIS — R7989 Other specified abnormal findings of blood chemistry: Secondary | ICD-10-CM

## 2019-03-28 MED ORDER — QUETIAPINE FUMARATE 300 MG PO TABS: 300.0000 mg | ORAL_TABLET | Freq: Every day | ORAL | 1 refills | Status: DC

## 2019-03-28 MED ORDER — SERTRALINE HCL 100 MG PO TABS: 200.0000 mg | ORAL_TABLET | Freq: Every day | ORAL | 1 refills | Status: DC

## 2019-03-28 MED ORDER — ZOLPIDEM TARTRATE 5 MG PO TABS: 5.0000 mg | ORAL_TABLET | Freq: Every evening | ORAL | 5 refills | Status: DC | PRN

## 2019-03-28 MED ORDER — ALPRAZOLAM 0.5 MG PO TABS: 0.2500 mg | ORAL_TABLET | Freq: Two times a day (BID) | ORAL | 5 refills | Status: DC | PRN

## 2019-03-28 NOTE — Progress Notes (Signed)
Vicki [name] [id number] 01/22/1950 69 y.o.  Virtual Visit via [employer] [place]  I connected with pt by WebEx and verified that I am speaking with the correct person using two identifiers.   I discussed the limitations, risks, security and privacy concerns of performing an evaluation and management service by [employer] [place] and the availability of in person appointments. I also discussed with the patient that there may be a patient responsible charge related to this service. The patient expressed understanding and agreed to proceed.  I discussed the assessment and treatment plan with the patient. The patient was provided an opportunity to ask questions and all were answered. The patient agreed with the plan and demonstrated an understanding of the instructions.   The patient was advised to call back or seek an in-person evaluation if the symptoms worsen or if the condition fails to improve as anticipated.  I provided 30 minutes of video time during this encounter. The call started at 330 and ended at 400. The patient was located at home and the provider was located office.   Subjective:   Patient ID:  Vicki [name] is a 69 y.o. (DOB 1950/02/28) female.  Chief Complaint:  Chief Complaint  Patient presents with  . Follow-up    Medication Management  . Depression    Medication Management  . Post-Traumatic Stress Disorder    Medication Management  . Sleeping Problem    Depression        Associated symptoms include no decreased concentration and no suicidal ideas.  Vicki [name] presents to the office today for follow-up of depressiion and anxiety and sleep.  Last seen August 2020.  No meds were changed.  Doing great except a hard time with surgery and afraid of meds and surgery.  Cut dosage before surgery of meds and didn't increase it back up.  Had problems with confusion after surgery and agitation and so did not increase the meds back up.  Thinks maybe she had problems with the pain  meds.  Back surgery 12/31/18.  Past month doing great except back pain.  depression and anxiety is overall OK.  Will get real irritable when having a lot of back pain.  Is taking alprazolam daily know 1-2 daily.  Sleep is better than it's been.  Rare Xanax won't put her to sleep.  No sig caffeine.  Was drinking 4-5 cups daily.    Pt reports that mood is Anxious and deep dark hole resolved and describes anxiety as Minimal. Anxiety symptoms include: Excessive Worry, Panic Symptoms, but much better.. Chronic unchanged sleep issues and naps some. Gotten tolerant to Seroquel.   Last night 3-4 hours but eventually catches up.  Insomnia in cycles. Regular.   Pt reports that appetite is good. Pt reports that energy is improved and improved. Concentration is good. Suicidal thoughts:  denied by patient.  Before meds had spells of crying.  Wants to stay on alprazolam and zolpidem.    Past Psychiatric Medication Trials: Clonidine 0.1 mg twice daily orthostatic, prazosin, trazodone 200, doxepin 10 no response, quetiapine 300, Zoloft 250  Review of Systems:  Review of Systems  Musculoskeletal: Positive for arthralgias and back pain.  Neurological: Negative for dizziness, tremors and weakness.  Psychiatric/Behavioral: Positive for depression. Negative for agitation, behavioral problems, confusion, decreased concentration, dysphoric mood, hallucinations, self-injury, sleep disturbance and suicidal ideas. The patient is nervous/anxious. The patient is not hyperactive.     Medications: I have reviewed the patient's current medications.  Current Outpatient Medications  Medication  Sig Dispense Refill  . acetaminophen (TYLENOL) 500 MG tablet Take 500 mg by mouth every 6 (six) hours as needed.    . ALPRAZolam (XANAX) 0.5 MG tablet Take 0.5-1 tablets (0.25-0.5 mg total) by mouth 2 (two) times daily as needed for anxiety. 60 tablet 5  . cloNIDine (CATAPRES) 0.1 MG tablet Take 1 tablet (0.1 mg total) by mouth  daily. 90 tablet 1  . magnesium oxide (MAG-OX) 400 MG tablet Take 400 mg by mouth daily.    . QUEtiapine (SEROQUEL) 300 MG tablet Take 1 tablet (300 mg total) by mouth at bedtime. 90 tablet 1  . sertraline (ZOLOFT) 100 MG tablet Take 2 tablets (200 mg total) by mouth daily. 180 tablet 1  . telmisartan (MICARDIS) 40 MG tablet Take 40 mg by mouth daily.   0  . zolpidem (AMBIEN) 5 MG tablet Take 1 tablet (5 mg total) by mouth at bedtime as needed. for sleep 30 tablet 5   No current facility-administered medications for this visit.    Medication Side Effects: None  Allergies:  Allergies  Allergen Reactions  . Codeine Other (See Comments)    headache  . Crestor [Rosuvastatin] Other (See Comments)    Muscle aches  . Other Nausea And Vomiting    Anesthesia-severe vomiting  . Simvastatin Other (See Comments)    Muscle aches  . Statins     Past Medical History:  Diagnosis Date  . Alcohol intoxication ([place]) 05/2014    ED note   . Anxiety   . Arthritis   . Boils   . Chronic back pain   . Community acquired pneumonia 01/05/2016  . Depression   . Fibromyalgia   . GERD (gastroesophageal reflux disease)   . Heart attack ([place])   . Hepatic cyst 01/06/2016  . Hyperlipidemia   . Hypertension   . Insomnia   . Leg cramps   . Migraines    with aura  . 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 Osteopenia   . PONV (postoperative nausea and vomiting)    severe  . Postmenopausal bleeding 12/2015  . PTSD (post-traumatic stress disorder)   . S/P epidural steroid injection   . 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 Spinal stenosis   . 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 Strep throat 01/05/2016  . Stroke [facility])    3 strokes, no residual    Family History  Problem Relation Age of Onset  . Stroke Father   . Depression Father   . Stroke Mother   . Hypertension  Brother   . Heart disease Brother        stint...PACER  . Heart attack Brother   . CAD Brother   . Heart attack Maternal Uncle   . Heart attack Maternal Grandmother   . Heart attack Brother   . Non-Hodgkin's lymphoma Brother   . CAD Brother   . Heart attack Maternal Uncle   . Heart attack Maternal Uncle   . Heart attack Maternal Uncle   . Heart attack Maternal Uncle   . Cholesteatoma Sister   . Hyperlipidemia Sister   . Other Brother        SUICIDE 05/2016    Social History   Socioeconomic History  . Marital status: Divorced    Spouse name: Not on file  .  Number of children: Not on file  . Years of education: Not on file  . Highest education level: Not on file  Occupational History  . Not on file  Tobacco Use  . Smoking status: Former Research scientist (life sciences)  . Smokeless tobacco: Never Used  Substance and Sexual Activity  . Alcohol use: No  . Drug use: No  . Sexual activity: Yes    Partners: Male    Birth control/protection: Post-menopausal    Comment: occ  Other Topics Concern  . Not on file  Social History Narrative  . Not on file   Social Determinants of Health   Financial Resource Strain:   . Difficulty of Paying Living Expenses:   Food Insecurity:   . Worried About Charity fundraiser in the Last Year:   . Arboriculturist in the Last Year:   Transportation Needs:   . Film/video editor (Medical):   Marland Kitchen Lack of Transportation (Non-Medical):   Physical Activity:   . Days of Exercise per Week:   . Minutes of Exercise per Session:   Stress:   . Feeling of Stress :   Social Connections:   . Frequency of Communication with Friends and Family:   . Frequency of Social Gatherings with Friends and Family:   . Attends Religious Services:   . Active Member of Clubs or Organizations:   . Attends Archivist Meetings:   Marland Kitchen Marital Status:   Intimate Partner Violence:   . Fear of Current or Ex-Partner:   . Emotionally Abused:   Marland Kitchen Physically Abused:   . Sexually  Abused:     Past Medical History, Surgical history, Social history, and Family history were reviewed and updated as appropriate.   Please see review of systems for further details on the patient's review from today.   Objective:   Physical Exam:  LMP 07/18/2011   Physical Exam Constitutional:      General: She is not in acute distress.    Appearance: She is well-developed.  Musculoskeletal:        General: No deformity.  Neurological:     Mental Status: She is alert and oriented to person, place, and time.     Motor: No tremor.     Coordination: Coordination normal.     Gait: Gait normal.  Psychiatric:        Attention and Perception: She is attentive.        Mood and Affect: Mood is anxious. Mood is not depressed. Affect is not labile, blunt, angry, tearful or inappropriate.        Speech: Speech normal.        Behavior: Behavior normal.        Thought Content: Thought content normal. Thought content does not include homicidal or suicidal ideation. Thought content does not include homicidal or suicidal plan.        Judgment: Judgment normal.     Comments: Insight intact. No auditory or visual hallucinations. No delusions.  Anxiety is episodic.     Lab Review:     Component Value Date/Time   NA 137 12/27/2018 1130   K 4.8 12/27/2018 1130   CL 104 12/27/2018 1130   CO2 24 12/27/2018 1130   GLUCOSE 112 (H) 12/27/2018 1130   BUN 16 12/27/2018 1130   CREATININE 0.83 12/27/2018 1130   CREATININE 0.65 07/23/2015 1454   CALCIUM 9.3 12/27/2018 1130   PROT 6.6 07/23/2015 1454   ALBUMIN 4.1 07/23/2015 1454   AST 19  07/23/2015 1454   ALT 15 07/23/2015 1454   ALKPHOS 84 07/23/2015 1454   BILITOT 0.6 07/23/2015 1454   GFRNONAA >60 12/27/2018 1130   GFRAA >60 12/27/2018 1130       Component Value Date/Time   WBC 8.9 12/27/2018 1130   RBC 5.03 12/27/2018 1130   HGB 15.2 (H) 12/27/2018 1130   HGB 15.7 05/09/2012 1426   HCT 46.2 (H) 12/27/2018 1130   PLT 259 12/27/2018  1130   MCV 91.8 12/27/2018 1130   MCH 30.2 12/27/2018 1130   MCHC 32.9 12/27/2018 1130   RDW 12.9 12/27/2018 1130   LYMPHSABS 2.3 01/05/2016 2238   MONOABS 0.5 01/05/2016 2238   EOSABS 0.1 01/05/2016 2238   BASOSABS 0.1 01/05/2016 2238    No results found for: POCLITH, LITHIUM   No results found for: PHENYTOIN, PHENOBARB, VALPROATE, CBMZ   .res Assessment: Plan:    Vicki [name] was seen today for follow-up, depression, post-traumatic stress disorder and sleeping problem.  Diagnoses and all orders for this visit:  Recurrent major depression in remission ([place]) -     QUEtiapine (SEROQUEL) 300 MG tablet; Take 1 tablet (300 mg total) by mouth at bedtime. -     sertraline (ZOLOFT) 100 MG tablet; Take 2 tablets (200 mg total) by mouth daily.  PTSD (post-traumatic stress disorder) -     sertraline (ZOLOFT) 100 MG tablet; Take 2 tablets (200 mg total) by mouth daily.  Insomnia due to mental condition -     zolpidem (AMBIEN) 5 MG tablet; Take 1 tablet (5 mg total) by mouth at bedtime as needed. for sleep  Delayed sleep phase syndrome -     zolpidem (AMBIEN) 5 MG tablet; Take 1 tablet (5 mg total) by mouth at bedtime as needed. for sleep  Low serum vitamin D  Other orders -     ALPRAZolam (XANAX) 0.5 MG tablet; Take 0.5-1 tablets (0.25-0.5 mg total) by mouth 2 (two) times daily as needed for anxiety.  Greater than 50% of 30 min non-face to face time with patient was spent on counseling and coordination of care. We discussed her long history of chronic major depression, anxiety and sleep problems.    Overall doing the best she's done in years.  In part that helps that her back pain is improved though not resolved after back surgery.  She reduced the dosages of quetiapine and sertraline before surgery because she was afraid of having complications with anesthesia.  She is kept a lower dose and has done all right.  She did in fact have complications from anesthesia.  They have cleared up now.   The sertraline and quetiapine are helping with mood.  Doing OK with less meds now.  No SE.  Call if lower doses start to fail.  Discussed that risk of this because the previous experience was at the low doses were not adequate.  Discussed potential metabolic side effects associated with atypical antipsychotics, as well as potential risk for movement side effects. Advised pt to contact office if movement side effects occur.  Discussed the use of metformin to help counteract weight gain associated with antipsychotics.  Discussed potential side effects including liver problems and low B12 among others. Metformin is helpful for weight loss . Option return to  Metformin  PMP is clear with no Bz Rx this year.  She fears addiction bc her mother was an addict.  We discussed the short-term and long-term risks of benzodiazapines.  Zolpidem 5 mg hs prn insomnia.  Don't take if not needed.  This appt was 30 mins.  Call if the depression or anxiety gets worse.  Follow-up 6 months  Lynder Parents MD, DFAPA    Future Appointments  Date Time Provider [place]  10/09/2019  3:00 PM Regina Eck, CNM [place] None    No orders of the defined types were placed in this encounter.     -------------------------------

## 2019-04-09 ENCOUNTER — Encounter: Payer: Self-pay | Admitting: Certified Nurse Midwife

## 2019-05-14 DIAGNOSIS — M4316 Spondylolisthesis, lumbar region: Secondary | ICD-10-CM | POA: Diagnosis not present

## 2019-08-27 DIAGNOSIS — C44321 Squamous cell carcinoma of skin of nose: Secondary | ICD-10-CM | POA: Diagnosis not present

## 2019-08-27 DIAGNOSIS — L578 Other skin changes due to chronic exposure to nonionizing radiation: Secondary | ICD-10-CM | POA: Diagnosis not present

## 2019-08-27 DIAGNOSIS — L719 Rosacea, unspecified: Secondary | ICD-10-CM | POA: Diagnosis not present

## 2019-08-27 DIAGNOSIS — L570 Actinic keratosis: Secondary | ICD-10-CM | POA: Diagnosis not present

## 2019-09-17 DIAGNOSIS — G4700 Insomnia, unspecified: Secondary | ICD-10-CM | POA: Diagnosis not present

## 2019-09-17 DIAGNOSIS — G459 Transient cerebral ischemic attack, unspecified: Secondary | ICD-10-CM | POA: Diagnosis not present

## 2019-09-17 DIAGNOSIS — I10 Essential (primary) hypertension: Secondary | ICD-10-CM | POA: Diagnosis not present

## 2019-09-17 DIAGNOSIS — E785 Hyperlipidemia, unspecified: Secondary | ICD-10-CM | POA: Diagnosis not present

## 2019-09-17 DIAGNOSIS — E782 Mixed hyperlipidemia: Secondary | ICD-10-CM | POA: Diagnosis not present

## 2019-09-17 DIAGNOSIS — D485 Neoplasm of uncertain behavior of skin: Secondary | ICD-10-CM | POA: Diagnosis not present

## 2019-10-01 ENCOUNTER — Other Ambulatory Visit: Payer: Self-pay | Admitting: Psychiatry

## 2019-10-01 DIAGNOSIS — F3340 Major depressive disorder, recurrent, in remission, unspecified: Secondary | ICD-10-CM

## 2019-10-01 DIAGNOSIS — F5105 Insomnia due to other mental disorder: Secondary | ICD-10-CM

## 2019-10-01 DIAGNOSIS — G4721 Circadian rhythm sleep disorder, delayed sleep phase type: Secondary | ICD-10-CM

## 2019-10-01 DIAGNOSIS — F4310 Post-traumatic stress disorder, unspecified: Secondary | ICD-10-CM

## 2019-10-09 ENCOUNTER — Ambulatory Visit: Payer: Self-pay | Admitting: Certified Nurse Midwife

## 2019-10-15 DIAGNOSIS — Z1211 Encounter for screening for malignant neoplasm of colon: Secondary | ICD-10-CM | POA: Diagnosis not present

## 2019-10-15 DIAGNOSIS — Z0000 Encounter for general adult medical examination without abnormal findings: Secondary | ICD-10-CM | POA: Diagnosis not present

## 2019-10-15 DIAGNOSIS — G720 Drug-induced myopathy: Secondary | ICD-10-CM | POA: Diagnosis not present

## 2019-10-15 DIAGNOSIS — F419 Anxiety disorder, unspecified: Secondary | ICD-10-CM | POA: Diagnosis not present

## 2019-10-15 DIAGNOSIS — I10 Essential (primary) hypertension: Secondary | ICD-10-CM | POA: Diagnosis not present

## 2019-10-15 DIAGNOSIS — Z23 Encounter for immunization: Secondary | ICD-10-CM | POA: Diagnosis not present

## 2019-10-15 DIAGNOSIS — I679 Cerebrovascular disease, unspecified: Secondary | ICD-10-CM | POA: Diagnosis not present

## 2019-10-15 DIAGNOSIS — E785 Hyperlipidemia, unspecified: Secondary | ICD-10-CM | POA: Diagnosis not present

## 2019-10-15 DIAGNOSIS — Z1389 Encounter for screening for other disorder: Secondary | ICD-10-CM | POA: Diagnosis not present

## 2019-10-21 ENCOUNTER — Other Ambulatory Visit: Payer: Self-pay

## 2019-10-21 ENCOUNTER — Encounter: Payer: Self-pay | Admitting: Psychiatry

## 2019-10-21 ENCOUNTER — Ambulatory Visit (INDEPENDENT_AMBULATORY_CARE_PROVIDER_SITE_OTHER): Payer: PPO | Admitting: Psychiatry

## 2019-10-21 DIAGNOSIS — R7989 Other specified abnormal findings of blood chemistry: Secondary | ICD-10-CM | POA: Diagnosis not present

## 2019-10-21 DIAGNOSIS — G4721 Circadian rhythm sleep disorder, delayed sleep phase type: Secondary | ICD-10-CM | POA: Diagnosis not present

## 2019-10-21 DIAGNOSIS — F4310 Post-traumatic stress disorder, unspecified: Secondary | ICD-10-CM | POA: Diagnosis not present

## 2019-10-21 DIAGNOSIS — F5105 Insomnia due to other mental disorder: Secondary | ICD-10-CM | POA: Diagnosis not present

## 2019-10-21 DIAGNOSIS — F3340 Major depressive disorder, recurrent, in remission, unspecified: Secondary | ICD-10-CM | POA: Diagnosis not present

## 2019-10-21 MED ORDER — ALPRAZOLAM 0.5 MG PO TABS: ORAL_TABLET | ORAL | 3 refills | Status: DC

## 2019-10-21 MED ORDER — QUETIAPINE FUMARATE 300 MG PO TABS: 300.0000 mg | ORAL_TABLET | Freq: Every day | ORAL | 1 refills | Status: DC

## 2019-10-21 MED ORDER — ZOLPIDEM TARTRATE 5 MG PO TABS: 5.0000 mg | ORAL_TABLET | Freq: Every evening | ORAL | 3 refills | Status: DC | PRN

## 2019-10-21 MED ORDER — CLONIDINE HCL 0.1 MG PO TABS: 0.1000 mg | ORAL_TABLET | Freq: Every day | ORAL | 1 refills | Status: DC

## 2019-10-21 NOTE — Progress Notes (Signed)
Vicki [name] [id number] 25-Sep-1950 69 y.o.   Subjective:   Patient ID:  Vicki [name] is a 69 y.o. (DOB 11-11-1950) female.  Chief Complaint:  Chief Complaint  Patient presents with  . Follow-up    Medication Management  . Depression    Medication Management  . Post-Traumatic Stress Disorder    Medication Management    Depression        Associated symptoms include no decreased concentration and no suicidal ideas.  Vicki [name] presents to the office today for follow-up of depressiion and anxiety and sleep.  Last seen August 2020.  No meds were changed.  03/28/19 appt noted: no med changes. Doing great except a hard time with surgery and afraid of meds and surgery.  Cut dosage before surgery of meds and didn't increase it back up.  Had problems with confusion after surgery and agitation and so did not increase the meds back up.  Thinks maybe she had problems with the pain meds. Back surgery 12/31/18.  Past month doing great except back pain.  depression and anxiety is overall OK.  Will get real irritable when having a lot of back pain.  Is taking alprazolam daily know 1-2 daily.  Sleep is better than it's been. Rare Xanax won't put her to sleep.  No sig caffeine.  Was drinking 4-5 cups daily.   Plan: no med changes  10/21/2019 appointment with the following noted: Very well overall.  Better than I've been in a long time. Tolerating meds. Has only been taking sertraline 100 mg since 02-01-2019. Son in law on death bed for 6 mos.  Hospice involved.   Sleep about like usual. Taking alprazolam 3-4 times per week. No caffeine in PM.  Pt reports that mood is Anxious and deep dark hole resolved and describes anxiety as Minimal. Anxiety symptoms include: Excessive Worry, Panic Symptoms, but much better.. Chronic unchanged sleep issues and naps some. Gotten tolerant to Seroquel.   Last night 3-4 hours but eventually catches up.  Insomnia in cycles. Regular.   Pt reports that  appetite is good. Pt reports that energy is improved and improved. Concentration is good. Suicidal thoughts:  denied by patient.  Before meds had spells of crying.  Wants to stay on alprazolam and zolpidem.    Past Psychiatric Medication Trials: Clonidine 0.1 mg twice daily orthostatic, prazosin, trazodone 200, doxepin 10 no response, quetiapine 300, Zoloft 250  Review of Systems:  Review of Systems  Cardiovascular: Negative for chest pain.  Musculoskeletal: Positive for arthralgias and back pain.  Neurological: Negative for dizziness, tremors and weakness.  Psychiatric/Behavioral: Positive for depression. Negative for agitation, behavioral problems, confusion, decreased concentration, dysphoric mood, hallucinations, self-injury, sleep disturbance and suicidal ideas. The patient is nervous/anxious. The patient is not hyperactive.     Medications: I have reviewed the patient's current medications.  Current Outpatient Medications  Medication Sig Dispense Refill  . acetaminophen (TYLENOL) 500 MG tablet Take 500 mg by mouth every 6 (six) hours as needed.    . ALPRAZolam (XANAX) 0.5 MG tablet TAKE 1/2 TO 1 (ONE-HALF TO ONE) TABLET BY MOUTH TWICE DAILY AS NEEDED FOR ANXIETY 60 tablet 3  . aspirin 325 MG tablet Take 325 mg by mouth daily.    . cloNIDine (CATAPRES) 0.1 MG tablet Take 1 tablet (0.1 mg total) by mouth daily. 90 tablet 1  . magnesium oxide (MAG-OX) 400 MG tablet Take 400 mg by mouth daily.    . QUEtiapine (SEROQUEL) 300 MG tablet  Take 1 tablet (300 mg total) by mouth at bedtime. 90 tablet 1  . sertraline (ZOLOFT) 100 MG tablet TAKE 2 & 1/2 (TWO & ONE-HALF) TABLETS BY MOUTH ONCE DAILY (Patient taking differently: Take 100 mg by mouth daily. ) 225 tablet 0  . telmisartan (MICARDIS) 40 MG tablet Take 40 mg by mouth daily.   0  . zolpidem (AMBIEN) 5 MG tablet Take 1 tablet (5 mg total) by mouth at bedtime as needed. for sleep 30 tablet 3   No current facility-administered medications  for this visit.    Medication Side Effects: None  Allergies:  Allergies  Allergen Reactions  . Codeine Other (See Comments)    headache  . Crestor [Rosuvastatin] Other (See Comments)    Muscle aches  . Other Nausea And Vomiting    Anesthesia-severe vomiting  . Simvastatin Other (See Comments)    Muscle aches  . Statins     Past Medical History:  Diagnosis Date  . Alcohol intoxication ([place]) 05/2014    ED note   . Anxiety   . Arthritis   . Boils   . Chronic back pain   . Community acquired pneumonia 01/05/2016  . Depression   . Fibromyalgia   . GERD (gastroesophageal reflux disease)   . Heart attack ([place])   . Hepatic cyst 01/06/2016  . Hyperlipidemia   . Hypertension   . Insomnia   . Leg cramps   . Migraines    with aura  . 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 Osteopenia   . PONV (postoperative nausea and vomiting)    severe  . Postmenopausal bleeding 12/2015  . PTSD (post-traumatic stress disorder)   . S/P epidural steroid injection   . 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 Spinal stenosis   . 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 Strep throat 01/05/2016  . Stroke [facility])    3 strokes, no residual    Family History  Problem Relation Age of Onset  . Stroke Father   . Depression Father   . Stroke Mother   . Hypertension Brother   . Heart disease Brother        stint...PACER  . Heart attack Brother   . CAD Brother   . Heart attack Maternal Uncle   . Heart attack Maternal Grandmother   . Heart attack Brother   . Non-Hodgkin's lymphoma Brother   . CAD Brother   . Heart attack Maternal Uncle   . Heart attack Maternal Uncle   . Heart attack Maternal Uncle   . Heart attack Maternal Uncle   . Cholesteatoma Sister   . Hyperlipidemia Sister   . Other Brother        SUICIDE 05/2016    Social History   Socioeconomic History  .  Marital status: Divorced    Spouse name: Not on file  . Number of children: Not on file  . Years of education: Not on file  . Highest education level: Not on file  Occupational History  . Not on file  Tobacco Use  . Smoking status: Former Research scientist (life sciences)  . Smokeless tobacco: Never Used  Vaping Use  . Vaping Use: Never used  Substance and Sexual Activity  . Alcohol use: No  . Drug use: No  . Sexual activity: Yes    Partners:  Male    Birth control/protection: Post-menopausal    Comment: occ  Other Topics Concern  . Not on file  Social History Narrative  . Not on file   Social Determinants of Health   Financial Resource Strain:   . Difficulty of Paying Living Expenses: Not on file  Food Insecurity:   . Worried About Charity fundraiser in the Last Year: Not on file  . Ran Out of Food in the Last Year: Not on file  Transportation Needs:   . Lack of Transportation (Medical): Not on file  . Lack of Transportation (Non-Medical): Not on file  Physical Activity:   . Days of Exercise per Week: Not on file  . Minutes of Exercise per Session: Not on file  Stress:   . Feeling of Stress : Not on file  Social Connections:   . Frequency of Communication with Friends and Family: Not on file  . Frequency of Social Gatherings with Friends and Family: Not on file  . Attends Religious Services: Not on file  . Active Member of Clubs or Organizations: Not on file  . Attends Archivist Meetings: Not on file  . Marital Status: Not on file  Intimate Partner Violence:   . Fear of Current or Ex-Partner: Not on file  . Emotionally Abused: Not on file  . Physically Abused: Not on file  . Sexually Abused: Not on file    Past Medical History, Surgical history, Social history, and Family history were reviewed and updated as appropriate.   Please see review of systems for further details on the patient's review from today.   Objective:   Physical Exam:  LMP 07/18/2011   Physical  Exam Constitutional:      General: She is not in acute distress.    Appearance: She is well-developed.  Musculoskeletal:        General: No deformity.  Neurological:     Mental Status: She is alert and oriented to person, place, and time.     Motor: No tremor.     Coordination: Coordination normal.     Gait: Gait normal.  Psychiatric:        Attention and Perception: She is attentive.        Mood and Affect: Mood is anxious. Mood is not depressed. Affect is not blunt, angry, tearful or inappropriate.        Speech: Speech normal.        Behavior: Behavior normal.        Thought Content: Thought content normal. Thought content does not include homicidal or suicidal ideation. Thought content does not include homicidal or suicidal plan.        Judgment: Judgment normal.     Comments: Insight intact. No auditory or visual hallucinations. No delusions.  Anxiety is episodic.     Lab Review:     Component Value Date/Time   NA 137 12/27/2018 1130   K 4.8 12/27/2018 1130   CL 104 12/27/2018 1130   CO2 24 12/27/2018 1130   GLUCOSE 112 (H) 12/27/2018 1130   BUN 16 12/27/2018 1130   CREATININE 0.83 12/27/2018 1130   CREATININE 0.65 07/23/2015 1454   CALCIUM 9.3 12/27/2018 1130   PROT 6.6 07/23/2015 1454   ALBUMIN 4.1 07/23/2015 1454   AST 19 07/23/2015 1454   ALT 15 07/23/2015 1454   ALKPHOS 84 07/23/2015 1454   BILITOT 0.6 07/23/2015 1454   GFRNONAA >60 12/27/2018 1130   GFRAA >60 12/27/2018 1130  Component Value Date/Time   WBC 8.9 12/27/2018 1130   RBC 5.03 12/27/2018 1130   HGB 15.2 (H) 12/27/2018 1130   HGB 15.7 05/09/2012 1426   HCT 46.2 (H) 12/27/2018 1130   PLT 259 12/27/2018 1130   MCV 91.8 12/27/2018 1130   MCH 30.2 12/27/2018 1130   MCHC 32.9 12/27/2018 1130   RDW 12.9 12/27/2018 1130   LYMPHSABS 2.3 01/05/2016 2238   MONOABS 0.5 01/05/2016 2238   EOSABS 0.1 01/05/2016 2238   BASOSABS 0.1 01/05/2016 2238    No results found for: POCLITH, LITHIUM    No results found for: PHENYTOIN, PHENOBARB, VALPROATE, CBMZ   .res Assessment: Plan:    Kehlani was seen today for follow-up, depression and post-traumatic stress disorder.  Diagnoses and all orders for this visit:  Recurrent major depression in remission ([place]) -     QUEtiapine (SEROQUEL) 300 MG tablet; Take 1 tablet (300 mg total) by mouth at bedtime.  PTSD (post-traumatic stress disorder) -     cloNIDine (CATAPRES) 0.1 MG tablet; Take 1 tablet (0.1 mg total) by mouth daily. -     ALPRAZolam (XANAX) 0.5 MG tablet; TAKE 1/2 TO 1 (ONE-HALF TO ONE) TABLET BY MOUTH TWICE DAILY AS NEEDED FOR ANXIETY  Insomnia due to mental condition -     zolpidem (AMBIEN) 5 MG tablet; Take 1 tablet (5 mg total) by mouth at bedtime as needed. for sleep  Delayed sleep phase syndrome -     zolpidem (AMBIEN) 5 MG tablet; Take 1 tablet (5 mg total) by mouth at bedtime as needed. for sleep  Low serum vitamin D  Greater than 50% of 30 min non-face to face time with patient was spent on counseling and coordination of care. We discussed her long history of chronic major depression, anxiety and sleep problems.    Overall doing the best she's done in years.  In part that helps that her back pain is improved though not resolved after back surgery.  She reduced the dosages of quetiapine and sertraline before surgery because she was afraid of having complications with anesthesia.  She is kept a lower dose and has done all right.  The sertraline and quetiapine are helping with mood.  Doing OK with less meds now.  No SE.  Call if lower doses start to fail.  Discussed that risk of this because the previous experience was at the low doses were not adequate.   No indication for med changes  Discussed potential metabolic side effects associated with atypical antipsychotics, as well as potential risk for movement side effects. Advised pt to contact office if movement side effects occur.  Discussed the use of metformin to  help counteract weight gain associated with antipsychotics.  Discussed potential side effects including liver problems and low B12 among others. Metformin is helpful for weight loss . Option return to  Metformin  PMP is clear with no Bz Rx this year.  She fears addiction bc her mother was an addict.  We discussed the short-term and long-term risks of benzodiazapines.  Zolpidem 5 mg hs prn insomnia.  Don't take if not needed.  Disc neg interaction with food.  This appt was 30 mins.  Call if the depression or anxiety gets worse.  Follow-up 6 months  Lynder Parents MD, DFAPA    No future appointments.  No orders of the defined types were placed in this encounter.     -------------------------------

## 2019-11-13 DIAGNOSIS — E785 Hyperlipidemia, unspecified: Secondary | ICD-10-CM | POA: Diagnosis not present

## 2019-11-13 DIAGNOSIS — G4700 Insomnia, unspecified: Secondary | ICD-10-CM | POA: Diagnosis not present

## 2019-11-13 DIAGNOSIS — I10 Essential (primary) hypertension: Secondary | ICD-10-CM | POA: Diagnosis not present

## 2019-11-13 DIAGNOSIS — G459 Transient cerebral ischemic attack, unspecified: Secondary | ICD-10-CM | POA: Diagnosis not present

## 2019-11-19 DIAGNOSIS — G4700 Insomnia, unspecified: Secondary | ICD-10-CM | POA: Diagnosis not present

## 2019-11-19 DIAGNOSIS — E785 Hyperlipidemia, unspecified: Secondary | ICD-10-CM | POA: Diagnosis not present

## 2019-11-19 DIAGNOSIS — G459 Transient cerebral ischemic attack, unspecified: Secondary | ICD-10-CM | POA: Diagnosis not present

## 2019-11-19 DIAGNOSIS — I10 Essential (primary) hypertension: Secondary | ICD-10-CM | POA: Diagnosis not present

## 2019-11-19 DIAGNOSIS — E782 Mixed hyperlipidemia: Secondary | ICD-10-CM | POA: Diagnosis not present

## 2019-11-19 DIAGNOSIS — M8580 Other specified disorders of bone density and structure, unspecified site: Secondary | ICD-10-CM | POA: Diagnosis not present

## 2019-12-22 ENCOUNTER — Other Ambulatory Visit: Payer: Self-pay | Admitting: Psychiatry

## 2019-12-22 DIAGNOSIS — F4310 Post-traumatic stress disorder, unspecified: Secondary | ICD-10-CM

## 2019-12-22 DIAGNOSIS — F3340 Major depressive disorder, recurrent, in remission, unspecified: Secondary | ICD-10-CM

## 2019-12-24 NOTE — Telephone Encounter (Signed)
Change dose? Patient taking 100 mg

## 2020-01-28 DIAGNOSIS — H02831 Dermatochalasis of right upper eyelid: Secondary | ICD-10-CM | POA: Diagnosis not present

## 2020-01-28 DIAGNOSIS — H25043 Posterior subcapsular polar age-related cataract, bilateral: Secondary | ICD-10-CM | POA: Diagnosis not present

## 2020-01-28 DIAGNOSIS — H25013 Cortical age-related cataract, bilateral: Secondary | ICD-10-CM | POA: Diagnosis not present

## 2020-01-28 DIAGNOSIS — H18413 Arcus senilis, bilateral: Secondary | ICD-10-CM | POA: Diagnosis not present

## 2020-01-28 DIAGNOSIS — H2512 Age-related nuclear cataract, left eye: Secondary | ICD-10-CM | POA: Diagnosis not present

## 2020-01-28 DIAGNOSIS — H2513 Age-related nuclear cataract, bilateral: Secondary | ICD-10-CM | POA: Diagnosis not present

## 2020-02-03 ENCOUNTER — Other Ambulatory Visit: Payer: Self-pay | Admitting: Psychiatry

## 2020-02-03 DIAGNOSIS — F4310 Post-traumatic stress disorder, unspecified: Secondary | ICD-10-CM

## 2020-04-13 DIAGNOSIS — F419 Anxiety disorder, unspecified: Secondary | ICD-10-CM | POA: Diagnosis not present

## 2020-04-13 DIAGNOSIS — E785 Hyperlipidemia, unspecified: Secondary | ICD-10-CM | POA: Diagnosis not present

## 2020-04-13 DIAGNOSIS — I679 Cerebrovascular disease, unspecified: Secondary | ICD-10-CM | POA: Diagnosis not present

## 2020-04-13 DIAGNOSIS — I10 Essential (primary) hypertension: Secondary | ICD-10-CM | POA: Diagnosis not present

## 2020-04-15 DIAGNOSIS — I10 Essential (primary) hypertension: Secondary | ICD-10-CM | POA: Diagnosis not present

## 2020-04-15 DIAGNOSIS — E782 Mixed hyperlipidemia: Secondary | ICD-10-CM | POA: Diagnosis not present

## 2020-04-15 DIAGNOSIS — G459 Transient cerebral ischemic attack, unspecified: Secondary | ICD-10-CM | POA: Diagnosis not present

## 2020-04-15 DIAGNOSIS — G4700 Insomnia, unspecified: Secondary | ICD-10-CM | POA: Diagnosis not present

## 2020-04-15 DIAGNOSIS — M8580 Other specified disorders of bone density and structure, unspecified site: Secondary | ICD-10-CM | POA: Diagnosis not present

## 2020-04-15 DIAGNOSIS — E785 Hyperlipidemia, unspecified: Secondary | ICD-10-CM | POA: Diagnosis not present

## 2020-04-20 DIAGNOSIS — H2512 Age-related nuclear cataract, left eye: Secondary | ICD-10-CM | POA: Diagnosis not present

## 2020-04-21 DIAGNOSIS — H2511 Age-related nuclear cataract, right eye: Secondary | ICD-10-CM | POA: Diagnosis not present

## 2020-04-22 ENCOUNTER — Ambulatory Visit (INDEPENDENT_AMBULATORY_CARE_PROVIDER_SITE_OTHER): Payer: PPO | Admitting: Psychiatry

## 2020-04-22 ENCOUNTER — Other Ambulatory Visit: Payer: Self-pay

## 2020-04-22 ENCOUNTER — Encounter: Payer: Self-pay | Admitting: Psychiatry

## 2020-04-22 DIAGNOSIS — G4721 Circadian rhythm sleep disorder, delayed sleep phase type: Secondary | ICD-10-CM

## 2020-04-22 DIAGNOSIS — T50905A Adverse effect of unspecified drugs, medicaments and biological substances, initial encounter: Secondary | ICD-10-CM

## 2020-04-22 DIAGNOSIS — F5105 Insomnia due to other mental disorder: Secondary | ICD-10-CM

## 2020-04-22 DIAGNOSIS — R635 Abnormal weight gain: Secondary | ICD-10-CM

## 2020-04-22 DIAGNOSIS — F3340 Major depressive disorder, recurrent, in remission, unspecified: Secondary | ICD-10-CM

## 2020-04-22 DIAGNOSIS — F4310 Post-traumatic stress disorder, unspecified: Secondary | ICD-10-CM

## 2020-04-22 MED ORDER — ALPRAZOLAM 0.5 MG PO TABS: ORAL_TABLET | ORAL | 5 refills | Status: DC

## 2020-04-22 MED ORDER — CLONIDINE HCL 0.1 MG PO TABS: 0.1000 mg | ORAL_TABLET | Freq: Every day | ORAL | 1 refills | Status: DC

## 2020-04-22 MED ORDER — QUETIAPINE FUMARATE 300 MG PO TABS: 300.0000 mg | ORAL_TABLET | Freq: Every day | ORAL | 1 refills | Status: DC

## 2020-04-22 MED ORDER — SERTRALINE HCL 100 MG PO TABS: ORAL_TABLET | ORAL | 1 refills | Status: DC

## 2020-04-22 MED ORDER — METFORMIN HCL 500 MG PO TABS: ORAL_TABLET | ORAL | 1 refills | Status: DC

## 2020-04-22 NOTE — Progress Notes (Signed)
Vicki [name] [id number] Jan 17, 1951 70 y.o.   Subjective:   Patient ID:  Vicki [name] is a 70 y.o. (DOB 1950/05/18) female.  Chief Complaint:  Chief Complaint  Patient presents with  . Follow-up  . Recurrent major depression in remission ([place])    Depression        Associated symptoms include no decreased concentration and no suicidal ideas.  Vicki [name] presents to the office today for follow-up of depressiion and anxiety and sleep.  seen August 2020.  No meds were changed.  03/28/19 appt noted: no med changes. Doing great except a hard time with surgery and afraid of meds and surgery.  Cut dosage before surgery of meds and didn't increase it back up.  Had problems with confusion after surgery and agitation and so did not increase the meds back up.  Thinks maybe she had problems with the pain meds. Back surgery 12/31/18.  Past month doing great except back pain.  depression and anxiety is overall OK.  Will get real irritable when having a lot of back pain.  Is taking alprazolam daily know 1-2 daily.  Sleep is better than it's been. Rare Xanax won't put her to sleep.  No sig caffeine.  Was drinking 4-5 cups daily.   Plan: no med changes  10/21/2019 appointment with the following noted: Very well overall.  Better than I've been in a long time. Tolerating meds. Has only been taking sertraline 100 mg since January 20, 2019. Son in law on death bed for 6 mos.  Hospice involved.   Sleep about like usual. Taking alprazolam 3-4 times per week. No caffeine in PM. Plan: Discussed the option of returning to metformin to help with weight loss  04/22/2020 appointment with following noted: Only taking quetiapine 300 mg 1/2 HS bc too letharigic with higher dose. At least one Xanax daily bc stress. Sister moved in and it's a strain. Done OK with it. ExH moved back in with her after he had a big stroke.  She felt compelled to do it.  Caregiver.  But he's much better than he was. Stress GS 70 yo  fell to pieces after son in law died.  Pt reports that mood is Anxious and deep dark hole resolved and describes anxiety as Minimal. Anxiety symptoms include: Excessive Worry, Panic Symptoms, but much better.. Chronic unchanged sleep issues and naps some. Gotten tolerant to Seroquel.   Last night 3-4 hours but eventually catches up.  Insomnia in cycles. Regular.   Pt reports that appetite is good. Pt reports that energy is improved and improved. Concentration is good. Suicidal thoughts:  denied by patient.  Before meds had spells of crying.  Wants to stay on alprazolam and zolpidem.    Past Psychiatric Medication Trials: Clonidine 0.1 mg twice daily orthostatic, prazosin, trazodone 200, doxepin 10 no response, quetiapine 300, Zoloft 250  Review of Systems:  Review of Systems  Cardiovascular: Negative for chest pain and palpitations.  Musculoskeletal: Positive for arthralgias and back pain.  Neurological: Negative for dizziness, tremors and weakness.  Psychiatric/Behavioral: Positive for depression. Negative for agitation, behavioral problems, confusion, decreased concentration, dysphoric mood, hallucinations, self-injury, sleep disturbance and suicidal ideas. The patient is nervous/anxious. The patient is not hyperactive.     Medications: I have reviewed the patient's current medications.  Current Outpatient Medications  Medication Sig Dispense Refill  . acetaminophen (TYLENOL) 500 MG tablet Take 800 mg by mouth every 6 (six) hours as needed.    Vicki [name] Products (VITAMIN  C) CHEW Chew by mouth.    . magnesium oxide (MAG-OX) 400 MG tablet Take 400 mg by mouth daily.    . metFORMIN (GLUCOPHAGE) 500 MG tablet 1 daily for 2 weeks then 1 twice daily 180 tablet 1  . telmisartan (MICARDIS) 40 MG tablet Take 40 mg by mouth daily.   0  . VITAMIN D PO Take by mouth.    Marland Kitchen VITAMIN E PO Take by mouth.    . ALPRAZolam (XANAX) 0.5 MG tablet TAKE 1/2 TO 1 (ONE-HALF TO ONE) TABLET BY MOUTH TWICE  DAILY AS NEEDED FOR ANXIETY 60 tablet 5  . aspirin 325 MG tablet Take 325 mg by mouth daily. (Patient not taking: Reported on 04/22/2020)    . cloNIDine (CATAPRES) 0.1 MG tablet Take 1 tablet (0.1 mg total) by mouth daily. 90 tablet 1  . QUEtiapine (SEROQUEL) 300 MG tablet Take 1 tablet (300 mg total) by mouth at bedtime. 90 tablet 1  . sertraline (ZOLOFT) 100 MG tablet TAKE 2 & 1/2 (TWO & ONE-HALF) TABLETS BY MOUTH ONCE DAILY 225 tablet 1  . zolpidem (AMBIEN) 5 MG tablet Take 1 tablet (5 mg total) by mouth at bedtime as needed. for sleep (Patient not taking: Reported on 04/22/2020) 30 tablet 3   No current facility-administered medications for this visit.    Medication Side Effects: None  Allergies:  Allergies  Allergen Reactions  . Codeine Other (See Comments)    headache  . Crestor [Rosuvastatin] Other (See Comments)    Muscle aches  . Other Nausea And Vomiting    Anesthesia-severe vomiting  . Simvastatin Other (See Comments)    Muscle aches  . Statins     Past Medical History:  Diagnosis Date  . Alcohol intoxication ([place]) 05/2014    ED note   . Anxiety   . Arthritis   . Boils   . Chronic back pain   . Community acquired pneumonia 01/05/2016  . Depression   . Fibromyalgia   . GERD (gastroesophageal reflux disease)   . Heart attack ([place])   . Hepatic cyst 01/06/2016  . Hyperlipidemia   . Hypertension   . Insomnia   . Leg cramps   . Migraines    with aura  . 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 Osteopenia   . PONV (postoperative nausea and vomiting)    severe  . Postmenopausal bleeding 12/2015  . PTSD (post-traumatic stress disorder)   . S/P epidural steroid injection   . 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 Spinal stenosis   . 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 Strep throat 01/05/2016  . Stroke [facility])    3 strokes, no residual     Family History  Problem Relation Age of Onset  . Stroke Father   . Depression Father   . Stroke Mother   . Hypertension Brother   . Heart disease Brother        stint...PACER  . Heart attack Brother   . CAD Brother   . Heart attack Maternal Uncle   . Heart attack Maternal Grandmother   . Heart attack Brother   . Non-Hodgkin's lymphoma Brother   . CAD Brother   . Heart attack Maternal Uncle   . Heart attack Maternal Uncle   . Heart attack Maternal Uncle   .  Heart attack Maternal Uncle   . Cholesteatoma Sister   . Hyperlipidemia Sister   . Other Brother        SUICIDE 05/2016    Social History   Socioeconomic History  . Marital status: Divorced    Spouse name: Not on file  . Number of children: Not on file  . Years of education: Not on file  . Highest education level: Not on file  Occupational History  . Not on file  Tobacco Use  . Smoking status: Former Research scientist (life sciences)  . Smokeless tobacco: Never Used  Vaping Use  . Vaping Use: Never used  Substance and Sexual Activity  . Alcohol use: No  . Drug use: No  . Sexual activity: Yes    Partners: Male    Birth control/protection: Post-menopausal    Comment: occ  Other Topics Concern  . Not on file  Social History Narrative  . Not on file   Social Determinants of Health   Financial Resource Strain: Not on file  Food Insecurity: Not on file  Transportation Needs: Not on file  Physical Activity: Not on file  Stress: Not on file  Social Connections: Not on file  Intimate Partner Violence: Not on file    Past Medical History, Surgical history, Social history, and Family history were reviewed and updated as appropriate.   Please see review of systems for further details on the patient's review from today.   Objective:   Physical Exam:  LMP 07/18/2011   Physical Exam Constitutional:      General: She is not in acute distress.    Appearance: She is well-developed.  Musculoskeletal:        General: No  deformity.  Neurological:     Mental Status: She is alert and oriented to person, place, and time.     Motor: No tremor.     Coordination: Coordination normal.     Gait: Gait normal.  Psychiatric:        Attention and Perception: She is attentive.        Mood and Affect: Mood is anxious. Mood is not depressed. Affect is not blunt, angry, tearful or inappropriate.        Speech: Speech normal.        Behavior: Behavior normal.        Thought Content: Thought content normal. Thought content does not include homicidal or suicidal ideation. Thought content does not include homicidal or suicidal plan.        Cognition and Memory: Cognition normal.        Judgment: Judgment normal.     Comments: Insight intact. No auditory or visual hallucinations. No delusions.  Anxiety is episodic and stress related. Talkative about GS     Lab Review:     Component Value Date/Time   NA 137 12/27/2018 1130   K 4.8 12/27/2018 1130   CL 104 12/27/2018 1130   CO2 24 12/27/2018 1130   GLUCOSE 112 (H) 12/27/2018 1130   BUN 16 12/27/2018 1130   CREATININE 0.83 12/27/2018 1130   CREATININE 0.65 07/23/2015 1454   CALCIUM 9.3 12/27/2018 1130   PROT 6.6 07/23/2015 1454   ALBUMIN 4.1 07/23/2015 1454   AST 19 07/23/2015 1454   ALT 15 07/23/2015 1454   ALKPHOS 84 07/23/2015 1454   BILITOT 0.6 07/23/2015 1454   GFRNONAA >60 12/27/2018 1130   GFRAA >60 12/27/2018 1130       Component Value Date/Time   WBC 8.9 12/27/2018 1130  RBC 5.03 12/27/2018 1130   HGB 15.2 (H) 12/27/2018 1130   HGB 15.7 05/09/2012 1426   HCT 46.2 (H) 12/27/2018 1130   PLT 259 12/27/2018 1130   MCV 91.8 12/27/2018 1130   MCH 30.2 12/27/2018 1130   MCHC 32.9 12/27/2018 1130   RDW 12.9 12/27/2018 1130   LYMPHSABS 2.3 01/05/2016 2238   MONOABS 0.5 01/05/2016 2238   EOSABS 0.1 01/05/2016 2238   BASOSABS 0.1 01/05/2016 2238    No results found for: POCLITH, LITHIUM   No results found for: PHENYTOIN, PHENOBARB, VALPROATE,  CBMZ   .res Assessment: Plan:    Vicki [name] was seen today for follow-up and recurrent major depression in remission ([facility]).  Diagnoses and all orders for this visit:  Recurrent major depression in remission ([place]) -     QUEtiapine (SEROQUEL) 300 MG tablet; Take 1 tablet (300 mg total) by mouth at bedtime. -     sertraline (ZOLOFT) 100 MG tablet; TAKE 2 & 1/2 (TWO & ONE-HALF) TABLETS BY MOUTH ONCE DAILY  PTSD (post-traumatic stress disorder) -     ALPRAZolam (XANAX) 0.5 MG tablet; TAKE 1/2 TO 1 (ONE-HALF TO ONE) TABLET BY MOUTH TWICE DAILY AS NEEDED FOR ANXIETY -     sertraline (ZOLOFT) 100 MG tablet; TAKE 2 & 1/2 (TWO & ONE-HALF) TABLETS BY MOUTH ONCE DAILY -     cloNIDine (CATAPRES) 0.1 MG tablet; Take 1 tablet (0.1 mg total) by mouth daily.  Insomnia due to mental condition  Delayed sleep phase syndrome  Weight gain due to medication -     metFORMIN (GLUCOPHAGE) 500 MG tablet; 1 daily for 2 weeks then 1 twice daily  Greater than 50% of 30 min non-face to face time with patient was spent on counseling and coordination of care. We discussed her long history of chronic major depression, anxiety and sleep problems.    Overall doing OK with meds.  In part that helps that her back pain is improved though not resolved after back surgery.   She is kept a lower dose and has done all right.  The sertraline and quetiapine are helping with mood.  Doing OK with less meds now.  No SE.  Call if lower doses start to fail.  Discussed that risk of this because the previous experience was at the low doses were not adequate.   No indication for med changes OK to get lower dose quetiapine 1/2 of 300 mg tablet, clonidine 0.1 mg HS,  sertraline 250 mg daily, alprazolam 0.5 mg prn.  Discussed potential metabolic side effects associated with atypical antipsychotics, as well as potential risk for movement side effects. Advised pt to contact office if movement side effects occur.  Discussed the use of metformin  to help counteract weight gain associated with antipsychotics.  Discussed potential side effects including liver problems and low B12 among others. Metformin is helpful for weight loss . Option return to  Metformin. Yes per her request.  PMP is clear with no Bz Rx this year.  She fears addiction bc her mother was an addict.  We discussed the short-term and long-term risks of benzodiazapines.  Zolpidem 5 mg hs prn insomnia.  Don't take if not needed.  Disc neg interaction with food.  Supportive therapy dealing with GS who is having psych problems.  Follow-up 6 months  Lynder Parents MD, DFAPA    No future appointments.  No orders of the defined types were placed in this encounter.     -------------------------------

## 2020-05-11 DIAGNOSIS — H2512 Age-related nuclear cataract, left eye: Secondary | ICD-10-CM | POA: Diagnosis not present

## 2020-05-11 DIAGNOSIS — H2511 Age-related nuclear cataract, right eye: Secondary | ICD-10-CM | POA: Diagnosis not present

## 2020-07-04 ENCOUNTER — Other Ambulatory Visit: Payer: Self-pay | Admitting: Psychiatry

## 2020-07-04 DIAGNOSIS — F5105 Insomnia due to other mental disorder: Secondary | ICD-10-CM

## 2020-07-04 DIAGNOSIS — G4721 Circadian rhythm sleep disorder, delayed sleep phase type: Secondary | ICD-10-CM

## 2020-07-06 NOTE — Telephone Encounter (Signed)
Last filled 5/28 appt on 10/5

## 2020-10-19 DIAGNOSIS — Z1159 Encounter for screening for other viral diseases: Secondary | ICD-10-CM | POA: Diagnosis not present

## 2020-10-19 DIAGNOSIS — G720 Drug-induced myopathy: Secondary | ICD-10-CM | POA: Diagnosis not present

## 2020-10-19 DIAGNOSIS — E785 Hyperlipidemia, unspecified: Secondary | ICD-10-CM | POA: Diagnosis not present

## 2020-10-19 DIAGNOSIS — Z0000 Encounter for general adult medical examination without abnormal findings: Secondary | ICD-10-CM | POA: Diagnosis not present

## 2020-10-19 DIAGNOSIS — I679 Cerebrovascular disease, unspecified: Secondary | ICD-10-CM | POA: Diagnosis not present

## 2020-10-19 DIAGNOSIS — I10 Essential (primary) hypertension: Secondary | ICD-10-CM | POA: Diagnosis not present

## 2020-10-19 DIAGNOSIS — F419 Anxiety disorder, unspecified: Secondary | ICD-10-CM | POA: Diagnosis not present

## 2020-10-19 DIAGNOSIS — Z789 Other specified health status: Secondary | ICD-10-CM | POA: Diagnosis not present

## 2020-10-19 DIAGNOSIS — Z1389 Encounter for screening for other disorder: Secondary | ICD-10-CM | POA: Diagnosis not present

## 2020-10-21 ENCOUNTER — Encounter: Payer: Self-pay | Admitting: Psychiatry

## 2020-10-21 ENCOUNTER — Telehealth (INDEPENDENT_AMBULATORY_CARE_PROVIDER_SITE_OTHER): Payer: PPO | Admitting: Psychiatry

## 2020-10-21 DIAGNOSIS — F5105 Insomnia due to other mental disorder: Secondary | ICD-10-CM | POA: Diagnosis not present

## 2020-10-21 DIAGNOSIS — R635 Abnormal weight gain: Secondary | ICD-10-CM | POA: Diagnosis not present

## 2020-10-21 DIAGNOSIS — F3340 Major depressive disorder, recurrent, in remission, unspecified: Secondary | ICD-10-CM | POA: Diagnosis not present

## 2020-10-21 DIAGNOSIS — R7989 Other specified abnormal findings of blood chemistry: Secondary | ICD-10-CM

## 2020-10-21 DIAGNOSIS — G4721 Circadian rhythm sleep disorder, delayed sleep phase type: Secondary | ICD-10-CM

## 2020-10-21 DIAGNOSIS — F4310 Post-traumatic stress disorder, unspecified: Secondary | ICD-10-CM

## 2020-10-21 DIAGNOSIS — T50905A Adverse effect of unspecified drugs, medicaments and biological substances, initial encounter: Secondary | ICD-10-CM

## 2020-10-21 MED ORDER — SERTRALINE HCL 100 MG PO TABS: ORAL_TABLET | ORAL | 1 refills | Status: DC

## 2020-10-21 MED ORDER — ALPRAZOLAM 0.5 MG PO TABS: ORAL_TABLET | ORAL | 5 refills | Status: DC

## 2020-10-21 MED ORDER — QUETIAPINE FUMARATE 300 MG PO TABS: 300.0000 mg | ORAL_TABLET | Freq: Every day | ORAL | 1 refills | Status: DC

## 2020-10-21 MED ORDER — CLONIDINE HCL 0.1 MG PO TABS: 0.1000 mg | ORAL_TABLET | Freq: Every day | ORAL | 1 refills | Status: DC

## 2020-10-21 NOTE — Progress Notes (Signed)
Vicki [name] [id number] Mar 04, 1950 70 y.o.  Video Visit via My Chart  I connected with pt by My Chart and verified that I am speaking with the correct person using two identifiers.   I discussed the limitations, risks, security and privacy concerns of performing an evaluation and management service by My Chart  and the availability of in person appointments. I also discussed with the patient that there may be a patient responsible charge related to this service. The patient expressed understanding and agreed to proceed.  I discussed the assessment and treatment plan with the patient. The patient was provided an opportunity to ask questions and all were answered. The patient agreed with the plan and demonstrated an understanding of the instructions.   The patient was advised to call back or seek an in-person evaluation if the symptoms worsen or if the condition fails to improve as anticipated.  I provided 30 minutes of video time during this encounter.  The patient was located at home and the provider was located office. Session from 345 to 415  Subjective:   Patient ID:  Vicki [name] is a 70 y.o. (DOB 1950-03-07) female.  Chief Complaint:  Chief Complaint  Patient presents with  . Follow-up  . Depression  . Post-Traumatic Stress Disorder  . Anxiety    Depression        Associated symptoms include no decreased concentration and no suicidal ideas. Vicki [name] presents to the office today for follow-up of depressiion and anxiety and sleep.  seen August 2020.  No meds were changed.  03/28/19 appt noted: no med changes. Doing great except a hard time with surgery and afraid of meds and surgery.  Cut dosage before surgery of meds and didn't increase it back up.  Had problems with confusion after surgery and agitation and so did not increase the meds back up.  Thinks maybe she had problems with the pain meds. Back surgery 12/31/18.  Past month doing great except back pain.   depression and anxiety is overall OK.  Will get real irritable when having a lot of back pain.  Is taking alprazolam daily know 1-2 daily.  Sleep is better than it's been. Rare Xanax won't put her to sleep.  No sig caffeine.  Was drinking 4-5 cups daily.   Plan: no med changes  10/21/2019 appointment with the following noted: Very well overall.  Better than I've been in a long time. Tolerating meds. Has only been taking sertraline 100 mg since 01/16/19. Son in law on death bed for 6 mos.  Hospice involved.   Sleep about like usual. Taking alprazolam 3-4 times per week. No caffeine in PM. Plan: Discussed the option of returning to metformin to help with weight loss  04/22/2020 appointment with following noted: Only taking quetiapine 300 mg 1/2 HS bc too letharigic with higher dose. At least one Xanax daily bc stress. Sister moved in and it's a strain. Done OK with it. ExH moved back in with her after he had a big stroke.  She felt compelled to do it.  Caregiver.  But he's much better than he was. Stress GS 70 yo fell to pieces after son in law died. Plan: OK to get lower dose quetiapine 1/2 of 300 mg tablet, clonidine 0.1 mg HS,  sertraline 250 mg daily, alprazolam 0.5 mg prn. Option return to  Metformin. Yes per her request.  10/21/2020 appointment with the following noted: Did fine with reduction in quetiapine to 150 mg  HS. Less hangover. Real stable.  Doesn't like take a lot of meds.  Sleep from 1-5 real well.  Sometimes naps.  Still active.  Can't do long walks DT back.  Anxiety comes and goes but OK in last 3-4 weeks. Takes alprazolam prn. Doesn't like driving on highway. No problems with meds. Sister there for 2 years and ExH still there too.  Sister can be a help but she'd rather sister move out.  Overall better boundaries. Stronger person and faith helps. Ambien prn not nightly.  Pt reports that mood is Anxious and deep dark hole resolved  and describes anxiety as Minimal. Anxiety  symptoms include: Excessive Worry, Panic Symptoms, but much better. . Chronic unchanged sleep issues and naps some. Gotten tolerant to Seroquel.   Last night 3-4 hours but eventually catches up.  Insomnia in cycles. Regular.   Pt reports that appetite is good. Pt reports that energy is improved and improved. Concentration is good. Suicidal thoughts:  denied by patient.  Before meds had spells of crying.  Wants to stay on alprazolam and zolpidem.    Past Psychiatric Medication Trials: Clonidine 0.1 mg twice daily orthostatic, prazosin, trazodone 200, doxepin 10 no response, quetiapine 300, Zoloft 250  Review of Systems:  Review of Systems  Eyes:  Negative for visual disturbance.  Cardiovascular:  Negative for chest pain and palpitations.  Musculoskeletal:  Positive for arthralgias and back pain.  Neurological:  Negative for dizziness, tremors and weakness.  Psychiatric/Behavioral:  Negative for agitation, behavioral problems, confusion, decreased concentration, dysphoric mood, hallucinations, self-injury, sleep disturbance and suicidal ideas. The patient is nervous/anxious. The patient is not hyperactive.    Medications: I have reviewed the patient's current medications.  Current Outpatient Medications  Medication Sig Dispense Refill  . acetaminophen (TYLENOL) 500 MG tablet Take 800 mg by mouth every 6 (six) hours as needed.    Marland Kitchen aspirin 325 MG tablet Take 325 mg by mouth daily.    . magnesium oxide (MAG-OX) 400 MG tablet Take 400 mg by mouth daily.    . metFORMIN (GLUCOPHAGE) 500 MG tablet 1 daily for 2 weeks then 1 twice daily 180 tablet 1  . telmisartan (MICARDIS) 40 MG tablet Take 40 mg by mouth daily.   0  . VITAMIN D PO Take by mouth.    Marland Kitchen VITAMIN E PO Take by mouth.    . zolpidem (AMBIEN) 5 MG tablet TAKE 1 TABLET BY MOUTH AT BEDTIME AS NEEDED FOR SLEEP 30 tablet 5  . ALPRAZolam (XANAX) 0.5 MG tablet TAKE 1/2 TO 1 (ONE-HALF TO ONE) TABLET BY MOUTH TWICE DAILY AS NEEDED FOR ANXIETY  60 tablet 5  . Bioflavonoid Products (VITAMIN C) CHEW Chew by mouth. (Patient not taking: Reported on 10/21/2020)    . cloNIDine (CATAPRES) 0.1 MG tablet Take 1 tablet (0.1 mg total) by mouth daily. 90 tablet 1  . QUEtiapine (SEROQUEL) 300 MG tablet Take 1 tablet (300 mg total) by mouth at bedtime. 90 tablet 1  . sertraline (ZOLOFT) 100 MG tablet TAKE 2 & 1/2 (TWO & ONE-HALF) TABLETS BY MOUTH ONCE DAILY 225 tablet 1   No current facility-administered medications for this visit.    Medication Side Effects: None  Allergies:  Allergies  Allergen Reactions  . Codeine Other (See Comments)    headache  . Crestor [Rosuvastatin] Other (See Comments)    Muscle aches  . Other Nausea And Vomiting    Anesthesia-severe vomiting  . Simvastatin Other (See Comments)    Muscle aches  .  Statins     Past Medical History:  Diagnosis Date  . Alcohol intoxication ([place]) 05/2014    ED note   . Anxiety   . Arthritis   . Boils   . Chronic back pain   . Community acquired pneumonia 01/05/2016  . Depression   . Fibromyalgia   . GERD (gastroesophageal reflux disease)   . Heart attack ([place])   . Hepatic cyst 01/06/2016  . Hyperlipidemia   . Hypertension   . Insomnia   . Leg cramps   . Migraines    with aura  . 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 Osteopenia   . PONV (postoperative nausea and vomiting)    severe  . Postmenopausal bleeding 12/2015  . PTSD (post-traumatic stress disorder)   . S/P epidural steroid injection   . 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 Spinal stenosis   . 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 Strep throat 01/05/2016  . Stroke [facility])    3 strokes, no residual    Family History  Problem Relation Age of Onset  . Stroke Father   . Depression Father   . Stroke Mother   . Hypertension Brother   . Heart disease Brother        stint...PACER  .  Heart attack Brother   . CAD Brother   . Heart attack Maternal Uncle   . Heart attack Maternal Grandmother   . Heart attack Brother   . Non-Hodgkin's lymphoma Brother   . CAD Brother   . Heart attack Maternal Uncle   . Heart attack Maternal Uncle   . Heart attack Maternal Uncle   . Heart attack Maternal Uncle   . Cholesteatoma Sister   . Hyperlipidemia Sister   . Other Brother        SUICIDE 05/2016    Social History   Socioeconomic History  . Marital status: Divorced    Spouse name: Not on file  . Number of children: Not on file  . Years of education: Not on file  . Highest education level: Not on file  Occupational History  . Not on file  Tobacco Use  . Smoking status: Former  . Smokeless tobacco: Never  Vaping Use  . Vaping Use: Never used  Substance and Sexual Activity  . Alcohol use: No  . Drug use: No  . Sexual activity: Yes    Partners: Male    Birth control/protection: Post-menopausal    Comment: occ  Other Topics Concern  . Not on file  Social History Narrative  . Not on file   Social Determinants of Health   Financial Resource Strain: Not on file  Food Insecurity: Not on file  Transportation Needs: Not on file  Physical Activity: Not on file  Stress: Not on file  Social Connections: Not on file  Intimate Partner Violence: Not on file    Past Medical History, Surgical history, Social history, and Family history were reviewed and updated as appropriate.   Please see review of systems for further details on the patient's review from today.   Objective:   Physical Exam:  LMP 07/18/2011   Physical Exam Constitutional:      General: She is not in acute distress.    Appearance: She is well-developed.  Musculoskeletal:  General: No deformity.  Neurological:     Mental Status: She is alert and oriented to person, place, and time.     Motor: No tremor.     Coordination: Coordination normal.     Gait: Gait normal.  Psychiatric:         Attention and Perception: She is attentive.        Mood and Affect: Mood is not anxious or depressed. Affect is not blunt, angry, tearful or inappropriate.        Speech: Speech normal.        Behavior: Behavior normal.        Thought Content: Thought content normal. Thought content does not include homicidal or suicidal ideation. Thought content does not include homicidal or suicidal plan.        Cognition and Memory: Cognition normal.        Judgment: Judgment normal.     Comments: Insight intact. No auditory or visual hallucinations. No delusions.  Anxiety is episodic and stress related but better lately and feels stronger.     Lab Review:     Component Value Date/Time   NA 137 12/27/2018 1130   K 4.8 12/27/2018 1130   CL 104 12/27/2018 1130   CO2 24 12/27/2018 1130   GLUCOSE 112 (H) 12/27/2018 1130   BUN 16 12/27/2018 1130   CREATININE 0.83 12/27/2018 1130   CREATININE 0.65 07/23/2015 1454   CALCIUM 9.3 12/27/2018 1130   PROT 6.6 07/23/2015 1454   ALBUMIN 4.1 07/23/2015 1454   AST 19 07/23/2015 1454   ALT 15 07/23/2015 1454   ALKPHOS 84 07/23/2015 1454   BILITOT 0.6 07/23/2015 1454   GFRNONAA >60 12/27/2018 1130   GFRAA >60 12/27/2018 1130       Component Value Date/Time   WBC 8.9 12/27/2018 1130   RBC 5.03 12/27/2018 1130   HGB 15.2 (H) 12/27/2018 1130   HGB 15.7 05/09/2012 1426   HCT 46.2 (H) 12/27/2018 1130   PLT 259 12/27/2018 1130   MCV 91.8 12/27/2018 1130   MCH 30.2 12/27/2018 1130   MCHC 32.9 12/27/2018 1130   RDW 12.9 12/27/2018 1130   LYMPHSABS 2.3 01/05/2016 2238   MONOABS 0.5 01/05/2016 2238   EOSABS 0.1 01/05/2016 2238   BASOSABS 0.1 01/05/2016 2238    No results found for: POCLITH, LITHIUM   No results found for: PHENYTOIN, PHENOBARB, VALPROATE, CBMZ   .res Assessment: Plan:    Vicki [name] was seen today for follow-up, depression, post-traumatic stress disorder and anxiety.  Diagnoses and all orders for this visit:  Recurrent major  depression in remission ([place]) -     QUEtiapine (SEROQUEL) 300 MG tablet; Take 1 tablet (300 mg total) by mouth at bedtime. -     sertraline (ZOLOFT) 100 MG tablet; TAKE 2 & 1/2 (TWO & ONE-HALF) TABLETS BY MOUTH ONCE DAILY  PTSD (post-traumatic stress disorder) -     ALPRAZolam (XANAX) 0.5 MG tablet; TAKE 1/2 TO 1 (ONE-HALF TO ONE) TABLET BY MOUTH TWICE DAILY AS NEEDED FOR ANXIETY -     cloNIDine (CATAPRES) 0.1 MG tablet; Take 1 tablet (0.1 mg total) by mouth daily. -     sertraline (ZOLOFT) 100 MG tablet; TAKE 2 & 1/2 (TWO & ONE-HALF) TABLETS BY MOUTH ONCE DAILY  Insomnia due to mental condition  Delayed sleep phase syndrome  Weight gain due to medication  Low serum vitamin D Greater than 50% of 30 min non-face to face time with patient was spent on counseling and  coordination of care. We discussed her long history of chronic major depression, anxiety and sleep problems.    Overall doing OK with meds.  In part that helps that her back pain is improved though not resolved after back surgery.   She is kept a lower dose and has done all right.  The sertraline and quetiapine are helping with mood.  Doing OK with less meds now.  No SE.  Call if lower doses start to fail.  Discussed that risk of this because the previous experience was at the low doses were not adequate.   No indication for med changes OK to continue lower dose quetiapine 1/2 of 300 mg tablet, clonidine 0.1 mg HS,  sertraline 250 mg daily, alprazolam 0.5 mg prn.  Discussed potential metabolic side effects associated with atypical antipsychotics, as well as potential risk for movement side effects. Advised pt to contact office if movement side effects occur.  Discussed the use of metformin to help counteract weight gain associated with antipsychotics.  Discussed potential side effects including liver problems and low B12 among others. Metformin is helpful for weight loss . Continue Metformin.   PMP is clear with no Bz Rx this  year.  She fears addiction bc her mother was an addict.  We discussed the short-term and long-term risks of benzodiazapines.  Zolpidem 5 mg hs prn insomnia.  Don't take if not needed.  Disc neg interaction with food.  Supportive therapy dealing with GS who is having psych problems.  Follow-up 6 months  Lynder Parents MD, DFAPA    No future appointments.  No orders of the defined types were placed in this encounter.     -------------------------------

## 2020-11-17 DIAGNOSIS — R030 Elevated blood-pressure reading, without diagnosis of hypertension: Secondary | ICD-10-CM | POA: Diagnosis not present

## 2020-12-08 ENCOUNTER — Telehealth: Payer: Self-pay | Admitting: Psychiatry

## 2020-12-08 NOTE — Telephone Encounter (Signed)
Kebrina called asking if she can increase her Alprazolam medication from 0.5 mg to a little higher dose? Her phone number is [phone].

## 2020-12-08 NOTE — Telephone Encounter (Signed)
FYI:  Patient called wanting to increase her alprazolam. She is currently taking 1 tablet qd. The last order was written for 0.5 to 1 tablet BID prn, so I told her she could take up to 1 more tablet. She was put on levothyroxine about a month ago and her PCP is still adjusting her medication. She wants to stop taking it. She states her BP is "thru the roof' and she feels like her body is vibrating. She also says she wants to cry all the time. I recommended she call her PCP. She said she has called her PCP's office but PCP is out sick. She was told someone would call her back today though.

## 2020-12-09 NOTE — Telephone Encounter (Signed)
Yes if the thyroid medication is increased by too much or too fast it can cause anxiety, so she should talke to her PCP's office about it.  They can fix that problem.  However in the interim she could increase the alprazolam to 0.5 mg TID prn anxiety.  Let us know if we need to send in a new RX

## 2020-12-09 NOTE — Telephone Encounter (Signed)
LVM for patient to RC.

## 2020-12-09 NOTE — Telephone Encounter (Signed)
Called patient again and was able to reach her. Let her know the recommendations for thyroid medication and increasing the alprazolam. She said that her PCP is out sick, but the covering provider told her just to stop the medication for now. She expressed understanding.

## 2020-12-14 DIAGNOSIS — E782 Mixed hyperlipidemia: Secondary | ICD-10-CM | POA: Diagnosis not present

## 2020-12-23 DIAGNOSIS — K6289 Other specified diseases of anus and rectum: Secondary | ICD-10-CM | POA: Diagnosis not present

## 2020-12-23 DIAGNOSIS — Z86010 Personal history of colonic polyps: Secondary | ICD-10-CM | POA: Diagnosis not present

## 2020-12-23 DIAGNOSIS — K635 Polyp of colon: Secondary | ICD-10-CM | POA: Diagnosis not present

## 2020-12-23 DIAGNOSIS — K621 Rectal polyp: Secondary | ICD-10-CM | POA: Diagnosis not present

## 2020-12-23 DIAGNOSIS — K5289 Other specified noninfective gastroenteritis and colitis: Secondary | ICD-10-CM | POA: Diagnosis not present

## 2020-12-23 DIAGNOSIS — K5730 Diverticulosis of large intestine without perforation or abscess without bleeding: Secondary | ICD-10-CM | POA: Diagnosis not present

## 2020-12-23 DIAGNOSIS — K648 Other hemorrhoids: Secondary | ICD-10-CM | POA: Diagnosis not present

## 2020-12-25 DIAGNOSIS — K635 Polyp of colon: Secondary | ICD-10-CM | POA: Diagnosis not present

## 2020-12-25 DIAGNOSIS — K621 Rectal polyp: Secondary | ICD-10-CM | POA: Diagnosis not present

## 2020-12-25 DIAGNOSIS — K5289 Other specified noninfective gastroenteritis and colitis: Secondary | ICD-10-CM | POA: Diagnosis not present

## 2020-12-30 DIAGNOSIS — R946 Abnormal results of thyroid function studies: Secondary | ICD-10-CM | POA: Diagnosis not present

## 2021-01-08 ENCOUNTER — Other Ambulatory Visit: Payer: Self-pay | Admitting: Psychiatry

## 2021-01-08 DIAGNOSIS — F5105 Insomnia due to other mental disorder: Secondary | ICD-10-CM

## 2021-01-08 DIAGNOSIS — G4721 Circadian rhythm sleep disorder, delayed sleep phase type: Secondary | ICD-10-CM

## 2021-04-01 DIAGNOSIS — L249 Irritant contact dermatitis, unspecified cause: Secondary | ICD-10-CM | POA: Diagnosis not present

## 2021-04-20 DIAGNOSIS — I10 Essential (primary) hypertension: Secondary | ICD-10-CM | POA: Diagnosis not present

## 2021-04-20 DIAGNOSIS — I679 Cerebrovascular disease, unspecified: Secondary | ICD-10-CM | POA: Diagnosis not present

## 2021-04-20 DIAGNOSIS — L304 Erythema intertrigo: Secondary | ICD-10-CM | POA: Diagnosis not present

## 2021-04-20 DIAGNOSIS — F419 Anxiety disorder, unspecified: Secondary | ICD-10-CM | POA: Diagnosis not present

## 2021-04-20 DIAGNOSIS — E785 Hyperlipidemia, unspecified: Secondary | ICD-10-CM | POA: Diagnosis not present

## 2021-04-21 ENCOUNTER — Other Ambulatory Visit: Payer: Self-pay | Admitting: Psychiatry

## 2021-04-21 DIAGNOSIS — F4310 Post-traumatic stress disorder, unspecified: Secondary | ICD-10-CM

## 2021-04-23 ENCOUNTER — Ambulatory Visit: Payer: PPO | Admitting: Internal Medicine

## 2021-06-03 DIAGNOSIS — I10 Essential (primary) hypertension: Secondary | ICD-10-CM | POA: Diagnosis not present

## 2021-06-03 DIAGNOSIS — E785 Hyperlipidemia, unspecified: Secondary | ICD-10-CM | POA: Diagnosis not present

## 2021-06-03 DIAGNOSIS — F419 Anxiety disorder, unspecified: Secondary | ICD-10-CM | POA: Diagnosis not present

## 2021-06-29 DIAGNOSIS — Z961 Presence of intraocular lens: Secondary | ICD-10-CM | POA: Diagnosis not present

## 2021-06-29 DIAGNOSIS — H18413 Arcus senilis, bilateral: Secondary | ICD-10-CM | POA: Diagnosis not present

## 2021-06-29 DIAGNOSIS — H26491 Other secondary cataract, right eye: Secondary | ICD-10-CM | POA: Diagnosis not present

## 2021-06-29 DIAGNOSIS — H26493 Other secondary cataract, bilateral: Secondary | ICD-10-CM | POA: Diagnosis not present

## 2021-06-29 DIAGNOSIS — H02831 Dermatochalasis of right upper eyelid: Secondary | ICD-10-CM | POA: Diagnosis not present

## 2021-07-06 DIAGNOSIS — D3131 Benign neoplasm of right choroid: Secondary | ICD-10-CM | POA: Diagnosis not present

## 2021-07-06 DIAGNOSIS — H524 Presbyopia: Secondary | ICD-10-CM | POA: Diagnosis not present

## 2021-07-06 DIAGNOSIS — H26493 Other secondary cataract, bilateral: Secondary | ICD-10-CM | POA: Diagnosis not present

## 2021-07-06 DIAGNOSIS — H26492 Other secondary cataract, left eye: Secondary | ICD-10-CM | POA: Diagnosis not present

## 2021-07-06 DIAGNOSIS — Z961 Presence of intraocular lens: Secondary | ICD-10-CM | POA: Diagnosis not present

## 2021-08-04 ENCOUNTER — Other Ambulatory Visit: Payer: Self-pay | Admitting: Psychiatry

## 2021-08-04 DIAGNOSIS — F4310 Post-traumatic stress disorder, unspecified: Secondary | ICD-10-CM

## 2021-08-04 DIAGNOSIS — F3340 Major depressive disorder, recurrent, in remission, unspecified: Secondary | ICD-10-CM

## 2021-10-12 ENCOUNTER — Encounter (HOSPITAL_BASED_OUTPATIENT_CLINIC_OR_DEPARTMENT_OTHER): Payer: Self-pay | Admitting: Internal Medicine

## 2021-10-12 ENCOUNTER — Ambulatory Visit (HOSPITAL_BASED_OUTPATIENT_CLINIC_OR_DEPARTMENT_OTHER): Payer: PPO | Admitting: Internal Medicine

## 2021-10-12 VITALS — BP 136/80 | HR 79 | Ht 62.0 in | Wt 176.2 lb

## 2021-10-12 DIAGNOSIS — T466X5D Adverse effect of antihyperlipidemic and antiarteriosclerotic drugs, subsequent encounter: Secondary | ICD-10-CM

## 2021-10-12 DIAGNOSIS — E7801 Familial hypercholesterolemia: Secondary | ICD-10-CM | POA: Diagnosis not present

## 2021-10-12 DIAGNOSIS — M791 Myalgia, unspecified site: Secondary | ICD-10-CM | POA: Diagnosis not present

## 2021-10-12 DIAGNOSIS — Z8673 Personal history of transient ischemic attack (TIA), and cerebral infarction without residual deficits: Secondary | ICD-10-CM

## 2021-10-12 MED ORDER — REPATHA SURECLICK 140 MG/ML ~~LOC~~ SOAJ: 1.0000 | SUBCUTANEOUS | 11 refills | Status: DC

## 2021-10-12 NOTE — Patient Instructions (Signed)
Medication Instructions:  Dr. Debara Pickett recommends Repatha Sureclick '140mg'$ /mL (PCSK9). This is an injectable cholesterol medication self-administered once every 14 days. This medication will likely need prior approval with your insurance company, which we will work on. If the medication is not approved initially, we may need to do an appeal with your insurance.   Administer medication in area of fatty tissue such as abdomen, outer thigh, back of upper arm - and rotate site with each injection Store medication in refrigerator until ready to administer - allow to sit at room temp for 30 mins - 1 hour prior to injection Dispose of medication in a SHARPS container - your pharmacy should be able to direct you on this and proper disposal   If you need a co-pay card for Repatha: MicroLists.at If you need a co-pay card for Praluent: https://praluentpatientsupport.[email or web address].uy  Patient Assistance:    These foundations have funds at various times.   The [employer] Foundation: https://www.panfoundation.org/disease-funds/hypercholesterolemia/ -- can sign up for wait list  [facility] offers assistance to help pay for medication copays.  They will cover copays for all cholesterol lowering meds, including statins, fibrates, omega-3 fish oils like Vascepa, ezetimibe, Repatha, Praluent, Nexletol, Nexlizet.  The cards are usually good for $2,500 or 12 months, whichever comes first. Go to healthwellfoundation.org Click on "Apply Now" Answer questions as to whom is applying (patient or representative) Your disease fund will be "hypercholesterolemia - Medicare access" They will ask questions about finances and which medications you are taking for cholesterol When you submit, the approval is usually within minutes.  You will need to print the card information from the site You will need to show this information to your pharmacy, they will bill your Medicare Part D plan first -then bill  Health Well --for the copay.   You can also call them at [phone], although the hold times can be quite long.     *If you need a refill on your cardiac medications before your next appointment, please call your pharmacy*   Lab Work: FASTING lab work to check cholesterol in 3-4 months -- complete about 1 week before next visit with Dr. Debara Pickett   If you have labs (blood work) drawn today and your tests are completely normal, you will receive your results only by: [place] (if you have MyChart) OR A paper copy in the mail If you have any lab test that is abnormal or we need to change your treatment, we will call you to review the results.   Follow-Up: At [facility], you and your health needs are our priority.  As part of our continuing mission to provide you with exceptional heart care, we have created designated Provider Care Teams.  These Care Teams include your primary Cardiologist (physician) and Advanced Practice Providers (APPs -  Physician Assistants and Nurse Practitioners) who all work together to provide you with the care you need, when you need it.  We recommend signing up for the patient portal called "MyChart".  Sign up information is provided on this After Visit Summary.  MyChart is used to connect with patients for Virtual Visits (Telemedicine).  Patients are able to view lab/test results, encounter notes, upcoming appointments, etc.  Non-urgent messages can be sent to your provider as well.   To learn more about what you can do with MyChart, go to [email or web address].    Your next appointment:    3-4 months with Dr. Debara Pickett

## 2021-10-12 NOTE — Progress Notes (Signed)
LIPID CLINIC CONSULT NOTE  Chief Complaint:  Manage dyslipidemia  Primary Care Physician: Carol Ada, MD  Primary Cardiologist:  Larae Grooms, MD  HPI:  Vicki [name] is a 71 y.o. female who is being seen today for the evaluation of dyslipidemia at the request of Carol Ada, MD. this is a pleasant 71 year old female kindly referred for evaluation and management of dyslipidemia.  She has a strong family history of high cholesterol and stroke in the family.  She has had 3 prior TIAs and work-up for coronary disease in the past due to abnormal Myoview stress testing which showed mild nonobstructive coronary disease by cath.  Unfortunately she has been statin intolerant having previously tried rosuvastatin and simvastatin.  She was enrolled in the SPIRE study back in 2016, which was investigating a novel PCSK9 inhibitor called bocuzimab.  It was not clear whether she was on drug or placebo, however the results published in the [place] [facility] failed to demonstrate cardiovascular risk reduction except for in high risk populations.  The drug never made it to market.  Recently, she was seen by Dr. Atilano Median in [facility] in November 2022.  He had recommended Repatha however she took 1 dose and had some swelling in her right wrist and was concerned about the cost of the medication and therefore did not pursue it.  She was then considered for Praluent but that was never started.  Ultimately then she was referred to me for options of therapy.  PMHx:  Past Medical History:  Diagnosis Date   Alcohol intoxication ([place]) 05/2014    ED note    Anxiety    Arthritis    Boils    Chronic back pain    Community acquired pneumonia 01/05/2016   Depression    Fibromyalgia    GERD (gastroesophageal reflux disease)    Heart attack ([place])    Hepatic cyst 01/06/2016   Hyperlipidemia    Hypertension    Insomnia    Leg cramps    Migraines    with aura   Mini stroke 05/2014   with  paralysis for 1 hr   Osteopenia    PONV (postoperative nausea and vomiting)    severe   Postmenopausal bleeding 12/2015   PTSD (post-traumatic stress disorder)    S/P epidural steroid injection    Seizures ([place])    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, passed out, 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 followed up with eye doctor no further issues   Spinal stenosis    Squamous cell carcinoma 12/25/2015   right leg and nose   Strep throat 01/05/2016   Stroke ([place])    3 strokes, no residual    Past Surgical History:  Procedure Laterality Date   BREAST ENHANCEMENT SURGERY     CARDIAC CATHETERIZATION N/A 08/12/2015   Procedure: Left Heart Cath and Coronary Angiography;  Surgeon: Jettie Booze, MD;  Location: [facility];  Service: Cardiovascular;  Laterality: N/A;   COLONOSCOPY     DILATATION & CURETTAGE/HYSTEROSCOPY WITH MYOSURE N/A 02/22/2016   Procedure: DILATATION & CURETTAGE/HYSTEROSCOPY WITH MYOSURE;  Surgeon: Nunzio Cobbs, MD;  Location: [facility];  Service: Gynecology;  Laterality: N/A;   FACIAL COSMETIC SURGERY     HAND SURGERY     nerve & tendon repair   LIPOSUCTION     LUMBAR EPIDURAL INJECTION  06/16/2015   SKIN BIOPSY Right 02/22/2016   Procedure: BIOPSY SKIN;  Surgeon: Vicenta Dunning  Quincy Simmonds, MD;  Location: [facility];  Service: Gynecology;  Laterality: Right;   SQUAMOUS CELL CARCINOMA EXCISION     WISDOM TOOTH EXTRACTION      FAMHx:  Family History  Problem Relation Age of Onset   Stroke Father    Depression Father    Stroke Mother    Hypertension Brother    Heart disease Brother        stint...PACER   Heart attack Brother    CAD Brother    Heart attack Maternal Uncle    Heart attack Maternal Grandmother    Heart attack Brother    Non-Hodgkin's lymphoma Brother    CAD Brother    Heart attack Maternal Uncle    Heart attack Maternal Uncle    Heart attack Maternal Uncle    Heart attack Maternal Uncle    Cholesteatoma Sister    Hyperlipidemia Sister    Other Brother         SUICIDE 05/2016    SOCHx:   reports that she has quit smoking. She has never used smokeless tobacco. She reports that she does not drink alcohol and does not use drugs.  ALLERGIES:  Allergies  Allergen Reactions   Codeine Other (See Comments)    headache   Crestor [Rosuvastatin] Other (See Comments)    Muscle aches   Other Nausea And Vomiting    Anesthesia-severe vomiting   Simvastatin Other (See Comments)    Muscle aches   Statins     ROS: Pertinent items noted in HPI and remainder of comprehensive ROS otherwise negative.  HOME MEDS: Current Outpatient Medications on File Prior to Visit  Medication Sig Dispense Refill   acetaminophen (TYLENOL) 500 MG tablet Take 800 mg by mouth every 6 (six) hours as needed.     ALPRAZolam (XANAX) 0.5 MG tablet TAKE 1/2 TO 1 (ONE-HALF TO ONE) TABLET BY MOUTH TWICE DAILY AS NEEDED FOR ANXIETY 60 tablet 3   Ascorbic Acid 125 MG CHEW Chew by mouth.     aspirin 325 MG tablet Take 325 mg by mouth daily.     Bioflavonoid Products (VITAMIN C) CHEW Chew by mouth.     cloNIDine (CATAPRES) 0.1 MG tablet Take 1 tablet (0.1 mg total) by mouth daily. 90 tablet 1   clotrimazole-betamethasone (LOTRISONE) cream Apply topically 2 (two) times daily.     levothyroxine (SYNTHROID) 25 MCG tablet Take 37.5 mcg by mouth every morning.     magnesium oxide (MAG-OX) 400 MG tablet Take 400 mg by mouth daily.     metroNIDAZOLE (METROCREAM) 0.75 % cream APPLY EXTERNALLY TO AFFECTED AREA OF ACNE TWICE DAILY     QUEtiapine (SEROQUEL) 300 MG tablet Take 1 tablet (300 mg total) by mouth at bedtime. (Patient taking differently: Take 150 mg by mouth at bedtime.) 90 tablet 1   sertraline (ZOLOFT) 100 MG tablet TAKE 2 & 1/2 (TWO & ONE-HALF) TABLETS BY MOUTH ONCE DAILY 150 tablet 0   telmisartan (MICARDIS) 40 MG tablet Take 40 mg by mouth daily.   0   VITAMIN D PO Take by mouth.     VITAMIN E PO Take by mouth.     zolpidem (AMBIEN) 5 MG tablet TAKE 1 TABLET BY MOUTH AT  BEDTIME AS NEEDED FOR SLEEP 30 tablet 3   No current facility-administered medications on file prior to visit.    LABS/IMAGING: No results found for this or any previous visit (from the past 48 hour(s)). No results found.  LIPID PANEL:    Component  Value Date/Time   CHOL 235 (H) 05/25/2014 0535   TRIG 352 (H) 05/25/2014 0535   HDL 38 (L) 05/25/2014 0535   CHOLHDL 6.2 05/25/2014 0535   VLDL 70 (H) 05/25/2014 0535   LDLCALC 127 (H) 05/25/2014 0535    WEIGHTS: Wt Readings from Last 3 Encounters:  10/12/21 176 lb 3.2 oz (79.9 kg)  12/31/18 195 lb 4 oz (88.6 kg)  12/27/18 195 lb 4 oz (88.6 kg)    VITALS: BP 136/80   Pulse 79   Ht '5\' 2"'$  (1.575 m)   Wt 176 lb 3.2 oz (79.9 kg)   LMP 07/18/2011   SpO2 98%   BMI 32.23 kg/m   EXAM: Deferred  EKG: Deferred  ASSESSMENT: Probable familial hyperlipidemia, LDL greater than 190 History of multiple TIAs Family history of high cholesterol and stroke Statin intolerant-myalgias  PLAN: 1.   Ms. Renn has a probable familial hyperlipidemia with LDL much greater than 190.  This has been a longstanding issue with a family history of high cholesterol and stroke and history of prior TIAs.  She cannot tolerate statins due to myalgias.  I am not clear that she was intolerant of Repatha only having had tried 1 dose and had some mild discomfort in her right wrist.  Cost may have been an issue however it seems that [place] would likely be better covered with her insurance.  We will pursue prior authorization again with this as she is agreeable to trying it.  I think this will be her best option.  Plan a follow-up lipid NMR and LP(a) in about 3 to 4 months.  Pixie Casino, MD, [facility], [place] Director of the Advanced Lipid Disorders &  Cardiovascular Risk Reduction Clinic Diplomate of the American Board of Clinical Lipidology Attending Cardiologist  Direct Dial: [phone]  Fax: [phone]   Website:  [email or web address] 10/12/2021, 2:41 PM

## 2021-10-13 ENCOUNTER — Ambulatory Visit (INDEPENDENT_AMBULATORY_CARE_PROVIDER_SITE_OTHER): Payer: PPO | Admitting: Psychiatry

## 2021-10-13 ENCOUNTER — Encounter: Payer: Self-pay | Admitting: Psychiatry

## 2021-10-13 ENCOUNTER — Telehealth: Payer: Self-pay

## 2021-10-13 DIAGNOSIS — G4721 Circadian rhythm sleep disorder, delayed sleep phase type: Secondary | ICD-10-CM

## 2021-10-13 DIAGNOSIS — F3340 Major depressive disorder, recurrent, in remission, unspecified: Secondary | ICD-10-CM | POA: Diagnosis not present

## 2021-10-13 DIAGNOSIS — F4310 Post-traumatic stress disorder, unspecified: Secondary | ICD-10-CM

## 2021-10-13 DIAGNOSIS — F5105 Insomnia due to other mental disorder: Secondary | ICD-10-CM | POA: Diagnosis not present

## 2021-10-13 MED ORDER — QUETIAPINE FUMARATE 300 MG PO TABS: 300.0000 mg | ORAL_TABLET | Freq: Every day | ORAL | 1 refills | Status: DC

## 2021-10-13 MED ORDER — ALPRAZOLAM 0.5 MG PO TABS: ORAL_TABLET | ORAL | 5 refills | Status: DC

## 2021-10-13 MED ORDER — SERTRALINE HCL 100 MG PO TABS: ORAL_TABLET | ORAL | 5 refills | Status: DC

## 2021-10-13 MED ORDER — ZOLPIDEM TARTRATE 5 MG PO TABS: 5.0000 mg | ORAL_TABLET | Freq: Every evening | ORAL | 1 refills | Status: DC | PRN

## 2021-10-13 MED ORDER — CLONIDINE HCL 0.1 MG PO TABS: 0.1000 mg | ORAL_TABLET | Freq: Every day | ORAL | 1 refills | Status: DC

## 2021-10-13 NOTE — Telephone Encounter (Signed)
PA for Repatha SureClick '140MG'$ /ML auto-injectors, Submitted and approved by [place]: [id number], RX # [id number], until 26-MAR-24
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
## 2021-10-14 NOTE — Telephone Encounter (Signed)
Attempted to call patient about Repatha approval. Called 2x. Went straight to VM and VM is full

## 2021-10-25 DIAGNOSIS — L304 Erythema intertrigo: Secondary | ICD-10-CM | POA: Diagnosis not present

## 2021-10-25 DIAGNOSIS — F419 Anxiety disorder, unspecified: Secondary | ICD-10-CM | POA: Diagnosis not present

## 2021-10-25 DIAGNOSIS — Z1331 Encounter for screening for depression: Secondary | ICD-10-CM | POA: Diagnosis not present

## 2021-10-25 DIAGNOSIS — E785 Hyperlipidemia, unspecified: Secondary | ICD-10-CM | POA: Diagnosis not present

## 2021-10-25 DIAGNOSIS — Z0000 Encounter for general adult medical examination without abnormal findings: Secondary | ICD-10-CM | POA: Diagnosis not present

## 2021-10-25 DIAGNOSIS — I679 Cerebrovascular disease, unspecified: Secondary | ICD-10-CM | POA: Diagnosis not present

## 2021-10-25 DIAGNOSIS — I10 Essential (primary) hypertension: Secondary | ICD-10-CM | POA: Diagnosis not present

## 2021-11-16 NOTE — Telephone Encounter (Signed)
Spoke with patient about med approval She did not see previous MyChart message  Advised her to apply for [employer] but she does not have a Conservator, museum/gallery on her behalf thru provider portal and she is approved until 10/17/2022  [facility] ID [id number]   STATUS Approved   START DATE 10/17/2021   END DATE 10/17/2022   GRANT BALANCE $2500.00  Pharmacy Card  CARD NO. [id number]  BIN [id number]   PCN [id number]   PC GROUP [id number]

## 2021-12-21 ENCOUNTER — Other Ambulatory Visit: Payer: Self-pay | Admitting: Psychiatry

## 2021-12-30 DIAGNOSIS — Z1231 Encounter for screening mammogram for malignant neoplasm of breast: Secondary | ICD-10-CM | POA: Diagnosis not present

## 2022-01-25 ENCOUNTER — Ambulatory Visit (HOSPITAL_BASED_OUTPATIENT_CLINIC_OR_DEPARTMENT_OTHER): Payer: PPO | Admitting: Internal Medicine

## 2022-02-21 ENCOUNTER — Other Ambulatory Visit: Payer: Self-pay | Admitting: Psychiatry

## 2022-02-21 DIAGNOSIS — G4721 Circadian rhythm sleep disorder, delayed sleep phase type: Secondary | ICD-10-CM

## 2022-02-21 DIAGNOSIS — F5105 Insomnia due to other mental disorder: Secondary | ICD-10-CM

## 2022-04-06 ENCOUNTER — Ambulatory Visit (INDEPENDENT_AMBULATORY_CARE_PROVIDER_SITE_OTHER): Payer: PPO | Admitting: Psychiatry

## 2022-04-06 ENCOUNTER — Encounter: Payer: Self-pay | Admitting: Psychiatry

## 2022-04-06 DIAGNOSIS — F3340 Major depressive disorder, recurrent, in remission, unspecified: Secondary | ICD-10-CM

## 2022-04-06 DIAGNOSIS — R7989 Other specified abnormal findings of blood chemistry: Secondary | ICD-10-CM | POA: Diagnosis not present

## 2022-04-06 DIAGNOSIS — G4721 Circadian rhythm sleep disorder, delayed sleep phase type: Secondary | ICD-10-CM

## 2022-04-06 DIAGNOSIS — F4310 Post-traumatic stress disorder, unspecified: Secondary | ICD-10-CM

## 2022-04-06 DIAGNOSIS — F5105 Insomnia due to other mental disorder: Secondary | ICD-10-CM | POA: Diagnosis not present

## 2022-04-06 MED ORDER — SERTRALINE HCL 100 MG PO TABS: 100.0000 mg | ORAL_TABLET | Freq: Every day | ORAL | 0 refills | Status: DC
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
## 2022-04-22 ENCOUNTER — Other Ambulatory Visit: Payer: Self-pay | Admitting: Psychiatry

## 2022-04-22 DIAGNOSIS — F4310 Post-traumatic stress disorder, unspecified: Secondary | ICD-10-CM

## 2022-04-25 DIAGNOSIS — E785 Hyperlipidemia, unspecified: Secondary | ICD-10-CM | POA: Diagnosis not present

## 2022-04-25 DIAGNOSIS — I10 Essential (primary) hypertension: Secondary | ICD-10-CM | POA: Diagnosis not present

## 2022-04-25 DIAGNOSIS — F419 Anxiety disorder, unspecified: Secondary | ICD-10-CM | POA: Diagnosis not present

## 2022-04-25 DIAGNOSIS — E039 Hypothyroidism, unspecified: Secondary | ICD-10-CM | POA: Diagnosis not present

## 2022-04-25 DIAGNOSIS — M8580 Other specified disorders of bone density and structure, unspecified site: Secondary | ICD-10-CM | POA: Diagnosis not present

## 2022-07-20 ENCOUNTER — Other Ambulatory Visit (HOSPITAL_BASED_OUTPATIENT_CLINIC_OR_DEPARTMENT_OTHER): Payer: Self-pay | Admitting: Internal Medicine

## 2022-07-20 DIAGNOSIS — E7801 Familial hypercholesterolemia: Secondary | ICD-10-CM

## 2022-07-20 DIAGNOSIS — E781 Pure hyperglyceridemia: Secondary | ICD-10-CM

## 2022-08-02 ENCOUNTER — Ambulatory Visit (HOSPITAL_BASED_OUTPATIENT_CLINIC_OR_DEPARTMENT_OTHER): Payer: PPO | Admitting: Internal Medicine

## 2022-08-02 ENCOUNTER — Encounter (HOSPITAL_BASED_OUTPATIENT_CLINIC_OR_DEPARTMENT_OTHER): Payer: Self-pay | Admitting: Internal Medicine

## 2022-08-02 VITALS — BP 100/78 | HR 84 | Ht 62.0 in | Wt 169.7 lb

## 2022-08-02 DIAGNOSIS — E7801 Familial hypercholesterolemia: Secondary | ICD-10-CM

## 2022-08-02 DIAGNOSIS — M791 Myalgia, unspecified site: Secondary | ICD-10-CM

## 2022-08-02 DIAGNOSIS — Z8673 Personal history of transient ischemic attack (TIA), and cerebral infarction without residual deficits: Secondary | ICD-10-CM

## 2022-08-02 DIAGNOSIS — T466X5D Adverse effect of antihyperlipidemic and antiarteriosclerotic drugs, subsequent encounter: Secondary | ICD-10-CM | POA: Diagnosis not present

## 2022-08-02 DIAGNOSIS — E782 Mixed hyperlipidemia: Secondary | ICD-10-CM | POA: Diagnosis not present

## 2022-08-02 DIAGNOSIS — T466X5A Adverse effect of antihyperlipidemic and antiarteriosclerotic drugs, initial encounter: Secondary | ICD-10-CM

## 2022-08-02 NOTE — Patient Instructions (Signed)
Medication Instructions:  Your physician recommends that you continue on your current medications as directed. Please refer to the Current Medication list given to you today.  *If you need a refill on your cardiac medications before your next appointment, please call your pharmacy*   Lab Work: Labs today on the 3rd floor   Follow-Up: At [facility], you and your health needs are our priority.  As part of our continuing mission to provide you with exceptional heart care, we have created designated Provider Care Teams.  These Care Teams include your primary Cardiologist (physician) and Advanced Practice Providers (APPs -  Physician Assistants and Nurse Practitioners) who all work together to provide you with the care you need, when you need it.  We recommend signing up for the patient portal called "MyChart".  Sign up information is provided on this After Visit Summary.  MyChart is used to connect with patients for Virtual Visits (Telemedicine).  Patients are able to view lab/test results, encounter notes, upcoming appointments, etc.  Non-urgent messages can be sent to your provider as well.   To learn more about what you can do with MyChart, go to [email or web address].    Your next appointment:   6 months with Dr. Rennis Golden in [facility]

## 2022-08-02 NOTE — Progress Notes (Signed)
LIPID CLINIC CONSULT NOTE  Chief Complaint:  Manage dyslipidemia  Primary Care Physician: Merri Brunette, MD  Primary Cardiologist:  Lance Muss, MD  HPI:  Vicki [name] is a 72 y.o. female who is being seen today for the evaluation of dyslipidemia at the request of Merri Brunette, MD. this is a pleasant 72 year old female kindly referred for evaluation and management of dyslipidemia.  She has a strong family history of high cholesterol and stroke in the family.  She has had 3 prior TIAs and work-up for coronary disease in the past due to abnormal Myoview stress testing which showed mild nonobstructive coronary disease by cath.  Unfortunately she has been statin intolerant having previously tried rosuvastatin and simvastatin.  She was enrolled in the SPIRE study back in 2016, which was investigating a novel PCSK9 inhibitor called bocuzimab.  It was not clear whether she was on drug or placebo, however the results published in the [place] Journal of Medicine failed to demonstrate cardiovascular risk reduction except for in high risk populations.  The drug never made it to market.  Recently, she was seen by Dr. Beverely Pace in [facility] in November 2022.  He had recommended Repatha however she took 1 dose and had some swelling in her right wrist and was concerned about the cost of the medication and therefore did not pursue it.  She was then considered for Praluent but that was never started.  Ultimately then she was referred to me for options of therapy.  08/02/2022  Vicki [name] is seen today in follow-up.  She continues on Repatha.  She says she has been having some issues where she has developed allergy/intolerance to shellfish.  She is also had some lung congestion which has been chronic.  She was recently noted to have worsening hypothyroidism and is now on treatment for that.  Besides this, she is not clearly having any side effects with Repatha.  She had some lab work in the fall which  showed an LDL of 127 but has not had any recent blood work.  I ordered a lipid NMR and LP(a) which was not performed.  She is due for her shot today.  PMHx:  Past Medical History:  Diagnosis Date   Alcohol intoxication (HCC) 05/2014    ED note    Anxiety    Arthritis    Boils    Chronic back pain    Community acquired pneumonia 01/05/2016   Depression    Fibromyalgia    GERD (gastroesophageal reflux disease)    Heart attack (HCC)    Hepatic cyst 01/06/2016   Hyperlipidemia    Hypertension    Insomnia    Leg cramps    Migraines    with aura   Mini stroke 05/2014   with paralysis for 1 hr   Osteopenia    PONV (postoperative nausea and vomiting)    severe   Postmenopausal bleeding 12/2015   PTSD (post-traumatic stress disorder)    S/P epidural steroid injection    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, passed out, 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 followed up with eye doctor no further issues   Spinal stenosis    Squamous cell carcinoma 12/25/2015   right leg and nose   Strep throat 01/05/2016   Stroke (HCC)    3 strokes, no residual    Past Surgical History:  Procedure Laterality Date   BREAST ENHANCEMENT SURGERY     CARDIAC CATHETERIZATION N/A 08/12/2015  Procedure: Left Heart Cath and Coronary Angiography;  Surgeon: Corky Crafts, MD;  Location: [facility] INVASIVE CV LAB;  Service: Cardiovascular;  Laterality: N/A;   COLONOSCOPY     DILATATION & CURETTAGE/HYSTEROSCOPY WITH MYOSURE N/A 02/22/2016   Procedure: DILATATION & CURETTAGE/HYSTEROSCOPY WITH MYOSURE;  Surgeon: Patton Salles, MD;  Location: [facility];  Service: Gynecology;  Laterality: N/A;   FACIAL COSMETIC SURGERY     HAND SURGERY     nerve & tendon repair   LIPOSUCTION     LUMBAR EPIDURAL INJECTION  06/16/2015   SKIN BIOPSY Right 02/22/2016   Procedure: BIOPSY SKIN;  Surgeon: Patton Salles, MD;  Location: [facility];  Service: Gynecology;  Laterality: Right;   SQUAMOUS CELL CARCINOMA EXCISION     WISDOM  TOOTH EXTRACTION      FAMHx:  Family History  Problem Relation Age of Onset   Stroke Father    Depression Father    Stroke Mother    Hypertension Brother    Heart disease Brother        stint...PACER   Heart attack Brother    CAD Brother    Heart attack Maternal Uncle    Heart attack Maternal Grandmother    Heart attack Brother    Non-Hodgkin's lymphoma Brother    CAD Brother    Heart attack Maternal Uncle    Heart attack Maternal Uncle    Heart attack Maternal Uncle    Heart attack Maternal Uncle    Cholesteatoma Sister    Hyperlipidemia Sister    Other Brother        SUICIDE 05/2016    SOCHx:   reports that she has quit smoking. She has never used smokeless tobacco. She reports that she does not drink alcohol and does not use drugs.  ALLERGIES:  Allergies  Allergen Reactions   Codeine Other (See Comments)    headache   Crestor [Rosuvastatin] Other (See Comments)    Muscle aches   Other Nausea And Vomiting    Anesthesia-severe vomiting   Simvastatin Other (See Comments)    Muscle aches   Shellfish Allergy Other (See Comments)    Throat swelling   Statins     ROS: Pertinent items noted in HPI and remainder of comprehensive ROS otherwise negative.  HOME MEDS: Current Outpatient Medications on File Prior to Visit  Medication Sig Dispense Refill   acetaminophen (TYLENOL) 500 MG tablet Take 800 mg by mouth every 6 (six) hours as needed.     ALPRAZolam (XANAX) 0.5 MG tablet TAKE 1/2 TO 1 TABLET BY MOUTH TWICE DAILY AS NEEDED 60 tablet 5   Ascorbic Acid 125 MG CHEW Chew by mouth.     aspirin 325 MG tablet Take 325 mg by mouth daily.     Bioflavonoid Products (VITAMIN C) CHEW Chew by mouth.     cloNIDine (CATAPRES) 0.1 MG tablet Take 1 tablet (0.1 mg total) by mouth daily. 90 tablet 1   clotrimazole-betamethasone (LOTRISONE) cream Apply topically 2 (two) times daily.     levothyroxine (SYNTHROID) 25 MCG tablet Take 37.5 mcg by mouth every morning.     magnesium  oxide (MAG-OX) 400 MG tablet Take 400 mg by mouth daily.     metroNIDAZOLE (METROCREAM) 0.75 % cream APPLY EXTERNALLY TO AFFECTED AREA OF ACNE TWICE DAILY     REPATHA SURECLICK 140 MG/ML SOAJ INJECT 1 DOSE INTO THE SKIN EVERY 14 DAYS 6 mL 0   sertraline (ZOLOFT) 100 MG tablet Take 1  tablet (100 mg total) by mouth daily. 90 tablet 0   telmisartan (MICARDIS) 40 MG tablet Take 40 mg by mouth daily.   0   VITAMIN D PO Take by mouth.     VITAMIN E PO Take by mouth.     zolpidem (AMBIEN) 5 MG tablet TAKE 1 TABLET BY MOUTH AT BEDTIME AS NEEDED FOR SLEEP 30 tablet 2   No current facility-administered medications on file prior to visit.    LABS/IMAGING: No results found for this or any previous visit (from the past 48 hour(s)). No results found.  LIPID PANEL:    Component Value Date/Time   CHOL 235 (H) 05/25/2014 0535   TRIG 352 (H) 05/25/2014 0535   HDL 38 (L) 05/25/2014 0535   CHOLHDL 6.2 05/25/2014 0535   VLDL 70 (H) 05/25/2014 0535   LDLCALC 127 (H) 05/25/2014 0535    WEIGHTS: Wt Readings from Last 3 Encounters:  08/02/22 169 lb 11.2 oz (77 kg)  10/12/21 176 lb 3.2 oz (79.9 kg)  12/31/18 195 lb 4 oz (88.6 kg)    VITALS: BP 100/78   Pulse 84   Ht 5\' 2"  (1.575 m)   Wt 169 lb 11.2 oz (77 kg)   LMP 07/18/2011   SpO2 100%   BMI 31.04 kg/m   EXAM: Deferred  EKG: Deferred  ASSESSMENT: Probable familial hyperlipidemia, LDL greater than 190 History of multiple TIAs Family history of high cholesterol and stroke Statin intolerant-myalgias  PLAN: 1.   Vicki [name] has remained on Repatha but is not had any recent lab work.  She is due for an NMR and LP(a) which we will obtain today.  Her last LDL in the fall was not 127.  She does have a history of multiple TIAs and family history of stroke therefore were targeting her LDL to less than 70.  She may need additional therapy.  I will contact her with those results and the management plan afterwards.  Follow-up in 6 months or sooner  as necessary.  Chrystie Nose, MD, [facility], FACP  [place]  [facility]  Medical Director of the [facility] Lipid Disorders &  Cardiovascular Risk Reduction Clinic Diplomate of the American Board of Clinical Lipidology Attending Cardiologist  Direct Dial: [phone]  Fax: [phone]  Website:  [email or web address] Nicely Austin Herd 08/02/2022, 8:45 AM

## 2022-08-05 LAB — NMR, LIPOPROFILE
Cholesterol, Total: 188 mg/dL (ref 100–199)
HDL Particle Number: 36.4 umol/L (ref >=30.5)
HDL-C: 41 mg/dL (ref >39)
LDL Particle Number: 1278 nmol/L — ABNORMAL HIGH (ref <1000)
LDL Size: 20.1 nm — ABNORMAL LOW (ref >20.5)
LDL-C (NIH Calc): 97 mg/dL (ref 0–99)
LP-IR Score: 44 (ref <=45)
Small LDL Particle Number: 949 nmol/L — ABNORMAL HIGH (ref <=527)
Triglycerides: 296 mg/dL — ABNORMAL HIGH (ref 0–149)

## 2022-08-05 LAB — LIPOPROTEIN A (LPA): Lipoprotein (a): 21.9 nmol/L (ref <75.0)

## 2022-08-11 ENCOUNTER — Other Ambulatory Visit: Payer: Self-pay | Admitting: Psychiatry

## 2022-08-11 DIAGNOSIS — F4310 Post-traumatic stress disorder, unspecified: Secondary | ICD-10-CM

## 2022-09-08 ENCOUNTER — Encounter: Payer: Self-pay | Admitting: Internal Medicine

## 2022-10-03 ENCOUNTER — Other Ambulatory Visit (HOSPITAL_BASED_OUTPATIENT_CLINIC_OR_DEPARTMENT_OTHER): Payer: Self-pay | Admitting: *Deleted

## 2022-10-03 DIAGNOSIS — M791 Myalgia, unspecified site: Secondary | ICD-10-CM

## 2022-10-03 DIAGNOSIS — E7801 Familial hypercholesterolemia: Secondary | ICD-10-CM

## 2022-10-03 MED ORDER — EZETIMIBE 10 MG PO TABS: 10.0000 mg | ORAL_TABLET | Freq: Every day | ORAL | 3 refills | Status: DC

## 2022-10-17 ENCOUNTER — Ambulatory Visit (INDEPENDENT_AMBULATORY_CARE_PROVIDER_SITE_OTHER): Payer: PPO | Admitting: Psychiatry

## 2022-10-17 ENCOUNTER — Encounter: Payer: Self-pay | Admitting: Psychiatry

## 2022-10-17 DIAGNOSIS — F4310 Post-traumatic stress disorder, unspecified: Secondary | ICD-10-CM | POA: Diagnosis not present

## 2022-10-17 DIAGNOSIS — R7989 Other specified abnormal findings of blood chemistry: Secondary | ICD-10-CM | POA: Diagnosis not present

## 2022-10-17 DIAGNOSIS — F3340 Major depressive disorder, recurrent, in remission, unspecified: Secondary | ICD-10-CM

## 2022-10-17 DIAGNOSIS — F5105 Insomnia due to other mental disorder: Secondary | ICD-10-CM | POA: Diagnosis not present

## 2022-10-17 DIAGNOSIS — G4721 Circadian rhythm sleep disorder, delayed sleep phase type: Secondary | ICD-10-CM | POA: Diagnosis not present

## 2022-10-17 MED ORDER — CLONIDINE HCL 0.1 MG PO TABS: 0.1000 mg | ORAL_TABLET | Freq: Every day | ORAL | 2 refills | Status: DC

## 2022-10-17 MED ORDER — SERTRALINE HCL 100 MG PO TABS: 100.0000 mg | ORAL_TABLET | Freq: Every day | ORAL | 2 refills | Status: DC
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
## 2022-10-18 ENCOUNTER — Ambulatory Visit (HOSPITAL_BASED_OUTPATIENT_CLINIC_OR_DEPARTMENT_OTHER): Payer: PPO | Admitting: Internal Medicine

## 2022-10-18 ENCOUNTER — Other Ambulatory Visit: Payer: Self-pay | Admitting: Psychiatry

## 2022-10-18 DIAGNOSIS — G4721 Circadian rhythm sleep disorder, delayed sleep phase type: Secondary | ICD-10-CM

## 2022-10-18 DIAGNOSIS — F5105 Insomnia due to other mental disorder: Secondary | ICD-10-CM

## 2022-10-18 NOTE — Telephone Encounter (Signed)
Okay to rf;  lv 10/17/22; lf 08/14/22

## 2022-10-19 DIAGNOSIS — C44729 Squamous cell carcinoma of skin of left lower limb, including hip: Secondary | ICD-10-CM | POA: Diagnosis not present

## 2022-10-19 DIAGNOSIS — L570 Actinic keratosis: Secondary | ICD-10-CM | POA: Diagnosis not present

## 2022-10-19 DIAGNOSIS — L578 Other skin changes due to chronic exposure to nonionizing radiation: Secondary | ICD-10-CM | POA: Diagnosis not present

## 2022-10-19 DIAGNOSIS — L820 Inflamed seborrheic keratosis: Secondary | ICD-10-CM | POA: Diagnosis not present

## 2022-10-19 DIAGNOSIS — L821 Other seborrheic keratosis: Secondary | ICD-10-CM | POA: Diagnosis not present

## 2022-10-27 DIAGNOSIS — Z0000 Encounter for general adult medical examination without abnormal findings: Secondary | ICD-10-CM | POA: Diagnosis not present

## 2022-10-27 DIAGNOSIS — G4700 Insomnia, unspecified: Secondary | ICD-10-CM | POA: Diagnosis not present

## 2022-10-27 DIAGNOSIS — Z1331 Encounter for screening for depression: Secondary | ICD-10-CM | POA: Diagnosis not present

## 2022-10-27 DIAGNOSIS — I10 Essential (primary) hypertension: Secondary | ICD-10-CM | POA: Diagnosis not present

## 2022-10-27 DIAGNOSIS — Z9181 History of falling: Secondary | ICD-10-CM | POA: Diagnosis not present

## 2022-10-27 DIAGNOSIS — E785 Hyperlipidemia, unspecified: Secondary | ICD-10-CM | POA: Diagnosis not present

## 2022-10-27 DIAGNOSIS — F419 Anxiety disorder, unspecified: Secondary | ICD-10-CM | POA: Diagnosis not present

## 2022-10-28 ENCOUNTER — Telehealth: Payer: Self-pay | Admitting: Internal Medicine

## 2022-10-28 DIAGNOSIS — M791 Myalgia, unspecified site: Secondary | ICD-10-CM

## 2022-10-28 DIAGNOSIS — E7801 Familial hypercholesterolemia: Secondary | ICD-10-CM

## 2022-10-28 NOTE — Telephone Encounter (Signed)
Pt is requesting a callback regarding her wanting to discuss labs she should have done before her office visit. Please advise

## 2022-10-28 NOTE — Telephone Encounter (Signed)
Pt called in because her PCP told her she has labs to complete that were ordered by Dr. Rennis Golden. Do not see any recent orders or orders from last office visit in July. PCP has not placed orders for any labs that patient is aware of.   Does patient need follow-up lab work from last visit?

## 2022-10-31 NOTE — Telephone Encounter (Signed)
Attempted to call patient. Not available and VM is full.   There is a "future" order for an NMR lipoprofile that will be released and mailed to patient -- due about a week before next visit.

## 2022-11-11 ENCOUNTER — Other Ambulatory Visit (HOSPITAL_BASED_OUTPATIENT_CLINIC_OR_DEPARTMENT_OTHER): Payer: Self-pay | Admitting: Internal Medicine

## 2022-11-11 DIAGNOSIS — E7801 Familial hypercholesterolemia: Secondary | ICD-10-CM

## 2022-11-14 ENCOUNTER — Telehealth: Payer: Self-pay | Admitting: Internal Medicine

## 2022-11-14 NOTE — Telephone Encounter (Signed)
Spoke with patient. Advised I have re-enrolled her for a healthwell grant. Provided her all ID numbers and will mail her a copy

## 2022-11-14 NOTE — Telephone Encounter (Signed)
Pt c/o medication issue:  1. Name of Medication:   Evolocumab (REPATHA SURECLICK) 140 MG/ML SOAJ   2. How are you currently taking this medication (dosage and times per day)?   3. Are you having a reaction (difficulty breathing--STAT)?   4. What is your medication issue?   Patient stated she is out of this medication and due to take her next shot on Tuesday but this medication is too expensive.  Patient wants to get back on program to get this medication at a reduced cost.

## 2022-11-14 NOTE — Telephone Encounter (Signed)
[employer]Well ID [id number] Patient Myranda Osias Status Approved Sub Status Active Start Date 10/18/2022 End Date 10/17/2023 Assistance Type Co-pay Paid $0.00 Pending $0.00 [facility] Balance $2500.00 Pharmacy Card Card No. [id number] Card Status Active BIN [id number] PCN [id number] [facility] [id number] Help Desk [phone] Provider PDMI Processor PDMI

## 2022-11-14 NOTE — Telephone Encounter (Signed)
Spoke with patient and she states her repatha is now $379 and that will be too expensive. She states she only gets $600 a month. She says [facility] informed her that she will need to apply for assistance.

## 2022-11-21 ENCOUNTER — Telehealth: Payer: Self-pay | Admitting: Psychiatry

## 2022-11-21 DIAGNOSIS — C44729 Squamous cell carcinoma of skin of left lower limb, including hip: Secondary | ICD-10-CM | POA: Diagnosis not present

## 2022-11-21 NOTE — Telephone Encounter (Signed)
Pt LVM @ 11:56p stating that she stopped taking Metformin and would like to get back on it.  Last time Dr Jennelle Human filled it was April 2022.  She said she read or saw on tiktok that it complements the thyroid medication that she takes   [facility] [address], [place] - [address] [place] [address] Phone: [phone]  Fax: [phone]   Next appt 6/30

## 2022-11-22 NOTE — Telephone Encounter (Signed)
I was giving her metformin when she was on Seroquel to counter act the wt gain of SERoquel.  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  Now that she is off Seroquel I cannot RX metformin.  She will need to get it from PCP.

## 2022-11-23 NOTE — Telephone Encounter (Signed)
PT NOTIFIED  

## 2023-01-04 DIAGNOSIS — M791 Myalgia, unspecified site: Secondary | ICD-10-CM | POA: Diagnosis not present

## 2023-01-04 DIAGNOSIS — E7801 Familial hypercholesterolemia: Secondary | ICD-10-CM | POA: Diagnosis not present

## 2023-01-04 DIAGNOSIS — T466X5A Adverse effect of antihyperlipidemic and antiarteriosclerotic drugs, initial encounter: Secondary | ICD-10-CM | POA: Diagnosis not present

## 2023-01-04 DIAGNOSIS — R7303 Prediabetes: Secondary | ICD-10-CM | POA: Diagnosis not present

## 2023-01-05 LAB — NMR, LIPOPROFILE
Cholesterol, Total: 210 mg/dL — ABNORMAL HIGH (ref 100–199)
HDL Particle Number: 38.8 umol/L (ref >=30.5)
HDL-C: 49 mg/dL (ref >39)
LDL Particle Number: 2094 nmol/L — ABNORMAL HIGH (ref <1000)
LDL Size: 20.3 nmol — ABNORMAL LOW (ref >20.5)
LDL-C (NIH Calc): 131 mg/dL — ABNORMAL HIGH (ref 0–99)
LP-IR Score: 49 — ABNORMAL HIGH (ref <=45)
Small LDL Particle Number: 1283 nmol/L — ABNORMAL HIGH (ref <=527)
Triglycerides: 171 mg/dL — ABNORMAL HIGH (ref 0–149)

## 2023-01-19 ENCOUNTER — Encounter (HOSPITAL_BASED_OUTPATIENT_CLINIC_OR_DEPARTMENT_OTHER): Payer: Self-pay | Admitting: Emergency Medicine

## 2023-01-19 ENCOUNTER — Ambulatory Visit (HOSPITAL_BASED_OUTPATIENT_CLINIC_OR_DEPARTMENT_OTHER)
Admission: EM | Admit: 2023-01-19 | Discharge: 2023-01-19 | Disposition: A | Discharge status: Home / Self Care | Payer: PPO | Attending: Family Medicine | Admitting: Family Medicine

## 2023-01-19 DIAGNOSIS — J209 Acute bronchitis, unspecified: Secondary | ICD-10-CM

## 2023-01-19 LAB — POCT INFLUENZA A/B
Influenza A, POC: NEGATIVE
Influenza B, POC: NEGATIVE

## 2023-01-19 MED ORDER — BENZONATATE 100 MG PO CAPS: 100.0000 mg | ORAL_CAPSULE | Freq: Three times a day (TID) | ORAL | 0 refills | Status: DC

## 2023-01-19 MED ORDER — IPRATROPIUM-ALBUTEROL 0.5-2.5 (3) MG/3ML IN SOLN
3.0000 mL | Freq: Once | RESPIRATORY_TRACT | Status: AC
  Administered 2023-01-19: 3 mL via RESPIRATORY_TRACT

## 2023-01-19 MED ORDER — PREDNISONE 10 MG (21) PO TBPK: ORAL_TABLET | ORAL | 0 refills | Status: DC

## 2023-01-19 MED ORDER — ALBUTEROL SULFATE HFA 108 (90 BASE) MCG/ACT IN AERS: 2.0000 | INHALATION_SPRAY | Freq: Four times a day (QID) | RESPIRATORY_TRACT | 0 refills | Status: DC | PRN

## 2023-01-19 NOTE — ED Provider Notes (Signed)
 [facility]    CSN: [id number] Arrival date & time: 01/19/23  1416      History   Chief Complaint Chief Complaint  Patient presents with   chest congestion    HPI Vicki [name] is a 73 y.o. female.   HPI Sick for 4 days symptoms include fever, body aches, fatigue, generalized weakness, headache, rhinorrhea, nasal congestion, ear pain, sore throat, loss of voice, cough.  Admits sharp pain in her lungs, admits decreased appetite.  Denies nausea, vomiting, diarrhea, rashes or skin changes  Was around her friend children who were ill, no confirmed COVID or flu Past Medical History:  Diagnosis Date   Alcohol intoxication (HCC) 05/2014    ED note    Anxiety    Arthritis    Boils    Chronic back pain    Community acquired pneumonia 01/05/2016   Depression    Fibromyalgia    GERD (gastroesophageal reflux disease)    Heart attack (HCC)    Hepatic cyst 01/06/2016   Hyperlipidemia    Hypertension    Insomnia    Leg cramps    Migraines    with aura   Mini stroke 05/2014   with paralysis for 1 hr   Osteopenia    PONV (postoperative nausea and vomiting)    severe   Postmenopausal bleeding 12/2015   PTSD (post-traumatic stress disorder)    S/P epidural steroid injection    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, passed out, 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 followed up with eye doctor no further issues   Spinal stenosis    Squamous cell carcinoma 12/25/2015   right leg and nose   Strep throat 01/05/2016   Stroke (HCC)    3 strokes, no residual    Patient Active Problem List   Diagnosis Date Noted   S/P lumbar fusion 12/31/2018   PTSD (post-traumatic stress disorder) 11/10/2017   Hypertriglyceridemia 10/07/2015   Abnormal nuclear stress test    Chest pain 07/23/2015   Palpitations 07/23/2015   Depression    Left-sided weakness 05/24/2014   Hyponatremia 05/24/2014   Right arm numbness 05/24/2014   Alcohol abuse 05/24/2014   Alcohol abuse with intoxication (HCC)  05/24/2014   Syncope and collapse 05/24/2014   Cerebral infarction [facility])    Essential hypertension    Hyperlipidemia 04/04/2013    Past Surgical History:  Procedure Laterality Date   BREAST ENHANCEMENT SURGERY     CARDIAC CATHETERIZATION N/A 08/12/2015   Procedure: Left Heart Cath and Coronary Angiography;  Surgeon: Candyce GORMAN Reek, MD;  Location: [facility] INVASIVE CV LAB;  Service: Cardiovascular;  Laterality: N/A;   COLONOSCOPY     DILATATION & CURETTAGE/HYSTEROSCOPY WITH MYOSURE N/A 02/22/2016   Procedure: DILATATION & CURETTAGE/HYSTEROSCOPY WITH MYOSURE;  Surgeon: Bobie FORBES Cathlyn JAYSON Nikki, MD;  Location: [facility];  Service: Gynecology;  Laterality: N/A;   FACIAL COSMETIC SURGERY     HAND SURGERY     nerve & tendon repair   LIPOSUCTION     LUMBAR EPIDURAL INJECTION  06/16/2015   SKIN BIOPSY Right 02/22/2016   Procedure: BIOPSY SKIN;  Surgeon: Bobie FORBES Cathlyn JAYSON Nikki, MD;  Location: [facility];  Service: Gynecology;  Laterality: Right;   SQUAMOUS CELL CARCINOMA EXCISION     WISDOM TOOTH EXTRACTION      OB History     Gravida  3   Para  3   Term  3   Preterm  AB      Living  3      SAB      IAB      Ectopic      Multiple      Live Births  3            Home Medications    Prior to Admission medications   Medication Sig Start Date End Date Taking? Authorizing Provider  acetaminophen  (TYLENOL ) 500 MG tablet Take 800 mg by mouth every 6 (six) hours as needed.    [provider]  ALPRAZolam  (XANAX ) 0.5 MG tablet TAKE 1/2 TO 1 TABLET BY MOUTH TWICE DAILY AS NEEDED 04/25/22   Cottle, Carey G Jr., MD  Ascorbic Acid 125 MG CHEW Chew by mouth.    [provider]  aspirin  325 MG tablet Take 325 mg by mouth daily.    [provider]  Bioflavonoid Products (VITAMIN C) CHEW Chew by mouth.    [provider]  cloNIDine  (CATAPRES ) 0.1 MG tablet Take 1 tablet (0.1 mg total) by mouth daily. 10/17/22   Cottle, Lorene KANDICE Raddle., MD   clotrimazole-betamethasone (LOTRISONE) cream Apply topically 2 (two) times daily. 07/07/21   [provider]  Evolocumab  (REPATHA  SURECLICK) 140 MG/ML SOAJ INJECT 1 DOSE INTO THE SKIN EVERY 14 DAYS 11/11/22   Hilty, Vinie BROCKS, MD  ezetimibe  (ZETIA ) 10 MG tablet Take 1 tablet (10 mg total) by mouth daily. 10/03/22 01/01/23  Mona Vinie BROCKS, MD  levothyroxine  (SYNTHROID ) 25 MCG tablet Take 37.5 mcg by mouth every morning. 10/05/21   [provider]  magnesium  oxide (MAG-OX) 400 MG tablet Take 400 mg by mouth daily.    [provider]  metroNIDAZOLE  (METROCREAM ) 0.75 % cream APPLY EXTERNALLY TO AFFECTED AREA OF ACNE TWICE DAILY 11/07/20   [provider]  sertraline  (ZOLOFT ) 100 MG tablet Take 1 tablet (100 mg total) by mouth daily. 10/17/22   Cottle, Lorene KANDICE Raddle., MD  telmisartan (MICARDIS) 40 MG tablet Take 40 mg by mouth daily.  09/11/17   [provider]  VITAMIN D  PO Take by mouth.    [provider]  VITAMIN E PO Take by mouth.    [provider]  zolpidem  (AMBIEN ) 5 MG tablet TAKE 1 TABLET BY MOUTH AT BEDTIME AS NEEDED FOR SLEEP 10/18/22   Cottle, Lorene KANDICE Raddle., MD    Family History Family History  Problem Relation Age of Onset   Stroke Father    Depression Father    Stroke Mother    Hypertension Brother    Heart disease Brother        stint...PACER   Heart attack Brother    CAD Brother    Heart attack Maternal Uncle    Heart attack Maternal Grandmother    Heart attack Brother    Non-Hodgkin's lymphoma Brother    CAD Brother    Heart attack Maternal Uncle    Heart attack Maternal Uncle    Heart attack Maternal Uncle    Heart attack Maternal Uncle    Cholesteatoma Sister    Hyperlipidemia Sister    Other Brother        SUICIDE 05/2016    Social History Social History   Tobacco Use   Smoking status: Former   Smokeless tobacco: Never  Vaping Use   Vaping status: Never Used  Substance Use Topics   Alcohol use: No    Drug use: No     Allergies   Codeine, Crestor [rosuvastatin], Other,  Simvastatin, Shellfish allergy, and Statins   Review of Systems Review of Systems  Constitutional:  Positive for appetite change, chills, fatigue and fever.  HENT:  Positive for congestion, ear pain, rhinorrhea, sinus pressure, sore throat and voice change. Negative for ear discharge and trouble swallowing.   Respiratory:  Positive for cough and wheezing. Negative for shortness of breath.   Cardiovascular:  Positive for chest pain.  Gastrointestinal:  Negative for abdominal pain, diarrhea, nausea and vomiting.  Neurological:  Positive for headaches. Negative for dizziness.     Physical Exam Triage Vital Signs ED Triage Vitals  Encounter Vitals Group     BP 01/19/23 1430 (!) 148/89     Systolic BP Percentile --      Diastolic BP Percentile --      Pulse Rate 01/19/23 1430 80     Resp 01/19/23 1430 20     Temp 01/19/23 1430 97.8 F (36.6 C)     Temp Source 01/19/23 1430 Oral     SpO2 01/19/23 1430 95 %     Weight --      Height --      Head Circumference --      Peak Flow --      Pain Score 01/19/23 1428 9     Pain Loc --      Pain Education --      Exclude from Growth Chart --    No data found.  Updated Vital Signs BP (!) 148/89 (BP Location: Right Arm)   Pulse 80   Temp 97.8 F (36.6 C) (Oral)   Resp 20   LMP 07/18/2011   SpO2 95%   Visual Acuity Right Eye Distance:   Left Eye Distance:   Bilateral Distance:    Right Eye Near:   Left Eye Near:    Bilateral Near:     Physical Exam Vitals and nursing note reviewed.  Constitutional:      Appearance: She is not ill-appearing or toxic-appearing.  HENT:     Head: Normocephalic and atraumatic.     Right Ear: Tympanic membrane and ear canal normal.     Left Ear: Tympanic membrane and ear canal normal.     Nose: Congestion present. No rhinorrhea.     Mouth/Throat:     Mouth: Mucous membranes are moist.     Pharynx: No oropharyngeal  exudate or posterior oropharyngeal erythema.  Eyes:     Conjunctiva/sclera: Conjunctivae normal.  Cardiovascular:     Rate and Rhythm: Normal rate and regular rhythm.     Heart sounds: Normal heart sounds.  Pulmonary:     Effort: No respiratory distress.     Breath sounds: Wheezing (Mild end expiratory) present. No rhonchi or rales.  Musculoskeletal:     Cervical back: Neck supple.  Lymphadenopathy:     Cervical: No cervical adenopathy.  Skin:    General: Skin is warm and dry.  Neurological:     Mental Status: She is alert and oriented to person, place, and time.  Psychiatric:        Mood and Affect: Mood normal.      UC Treatments / Results  Labs (all labs ordered are listed, but only abnormal results are displayed) Labs Reviewed  POCT INFLUENZA A/B    EKG   Radiology No results found.  Procedures Procedures (including critical care time)  Medications Ordered in UC Medications  ipratropium-albuterol  (DUONEB) 0.5-2.5 (3) MG/3ML nebulizer solution 3 mL (has no administration in time range)  Initial Impression / Assessment and Plan / UC Course  I have reviewed the triage vital signs and the nursing notes.  Pertinent labs & imaging results that were available during my care of the patient were reviewed by me and considered in my medical decision making (see chart for details).     73 year old female sick for 4 days, symptoms include fever, chills, body aches, fatigue, nasal congestion, wheezing. She is currently afebrile and nontoxic-appearing however she does have mild diffuse wheezing.  DuoNeb was given.  She was examined after DuoNeb her breath sounds have improved and the wheezing has decreased, she states she feels better.  No rales or rhonchi noted, no respiratory distress. Discussed with patient concern for bronchitis will treat with inhaler, cough medications and steroid.  She was counseled to follow-up for new, worsening symptoms or concerns and should  consider following up at a facility that has x-ray available   Final diagnoses:  None   Discharge Instructions   None    ED Prescriptions   None    PDMP not reviewed this encounter.   Treven Holtman, [place] 01/19/23 [phone]

## 2023-01-19 NOTE — Discharge Instructions (Addendum)
 See your doctor on Monday for recheck Go to the emergency department for new, worsening symptoms or concerns specifically for high fever, shaking chills, chest pain, shortness of breath, worsening wheezing

## 2023-01-19 NOTE — ED Triage Notes (Signed)
 Pt c/o x 4 days nasal/ chest congestion, headache, sore throat, voice loss, weakness, bilateral ear pain.

## 2023-01-24 ENCOUNTER — Ambulatory Visit: Payer: PPO | Attending: Internal Medicine | Admitting: Internal Medicine

## 2023-01-24 ENCOUNTER — Encounter: Payer: Self-pay | Admitting: Internal Medicine

## 2023-01-24 VITALS — BP 156/94 | HR 80 | Wt 174.8 lb

## 2023-01-24 DIAGNOSIS — J219 Acute bronchiolitis, unspecified: Secondary | ICD-10-CM

## 2023-01-24 DIAGNOSIS — R0602 Shortness of breath: Secondary | ICD-10-CM | POA: Diagnosis not present

## 2023-01-24 DIAGNOSIS — E7801 Familial hypercholesterolemia: Secondary | ICD-10-CM | POA: Diagnosis not present

## 2023-01-24 NOTE — Progress Notes (Signed)
 LIPID CLINIC CONSULT NOTE  Chief Complaint:  Manage dyslipidemia  Primary Care Physician: Claudene Pellet, MD  Primary Cardiologist:  Candyce Reek, MD  HPI:  Vicki [name] is a 73 y.o. female who is being seen today for the evaluation of dyslipidemia at the request of Claudene Pellet, MD. this is a pleasant 73 year old female kindly referred for evaluation and management of dyslipidemia.  She has a strong family history of high cholesterol and stroke in the family.  She has had 3 prior TIAs and work-up for coronary disease in the past due to abnormal Myoview  stress testing which showed mild nonobstructive coronary disease by cath.  Unfortunately she has been statin intolerant having previously tried rosuvastatin and simvastatin.  She was enrolled in the SPIRE study back in 2016, which was investigating a novel PCSK9 inhibitor called bocuzimab.  It was not clear whether she was on drug or placebo, however the results published in the New England Journal of Medicine failed to demonstrate cardiovascular risk reduction except for in high risk populations.  The drug never made it to market.  Recently, she was seen by Dr. Launie in [facility] in November 2022.  He had recommended Repatha  however she took 1 dose and had some swelling in her right wrist and was concerned about the cost of the medication and therefore did not pursue it.  She was then considered for Praluent but that was never started.  Ultimately then she was referred to me for options of therapy.  08/02/2022  [name] is seen today in follow-up.  She continues on Repatha .  She says she has been having some issues where she has developed allergy/intolerance to shellfish.  She is also had some lung congestion which has been chronic.  She was recently noted to have worsening hypothyroidism and is now on treatment for that.  Besides this, she is not clearly having any side effects with Repatha .  She had some lab work in the fall  which showed an LDL of 127 but has not had any recent blood work.  I ordered a lipid NMR and LP(a) which was not performed.  She is due for her shot today.  01/24/2023  [name] seen today in follow-up.  She has a harsh bronchitic cough today.  She was seen about 5 days ago in urgent care in [place] and provided a nebulizer and steroids.  She has not taken the steroids because of fears of elevated blood pressure and jitteriness.  Despite that she still having shortness of breath and difficulty breathing.  Also her cholesterol is elevated.  Her LDL was 131, up from 97 in July.  She has not aware as to why that is happening.  She says she does not eat much.  I suspect it must be dietary.  She reports compliance with her Repatha  and Zetia .  PMHx:  Past Medical History:  Diagnosis Date   Alcohol intoxication (HCC) 05/2014    ED note    Anxiety    Arthritis    Boils    Chronic back pain    Community acquired pneumonia 01/05/2016   Depression    Fibromyalgia    GERD (gastroesophageal reflux disease)    Heart attack (HCC)    Hepatic cyst 01/06/2016   Hyperlipidemia    Hypertension    Insomnia    Leg cramps    Migraines    with aura   Mini stroke 05/2014   with paralysis for 1 hr  Osteopenia    PONV (postoperative nausea and vomiting)    severe   Postmenopausal bleeding 12/2015   PTSD (post-traumatic stress disorder)    S/P epidural steroid injection    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, passed out, 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 followed up with eye doctor no further issues   Spinal stenosis    Squamous cell carcinoma 12/25/2015   right leg and nose   Strep throat 01/05/2016   Stroke (HCC)    3 strokes, no residual    Past Surgical History:  Procedure Laterality Date   BREAST ENHANCEMENT SURGERY     CARDIAC CATHETERIZATION N/A 08/12/2015   Procedure: Left Heart Cath and Coronary Angiography;  Surgeon: Candyce GORMAN Reek, MD;  Location: [facility] INVASIVE CV LAB;  Service:  Cardiovascular;  Laterality: N/A;   COLONOSCOPY     DILATATION & CURETTAGE/HYSTEROSCOPY WITH MYOSURE N/A 02/22/2016   Procedure: DILATATION & CURETTAGE/HYSTEROSCOPY WITH MYOSURE;  Surgeon: Bobie FORBES Cathlyn JAYSON Nikki, MD;  Location: [facility];  Service: Gynecology;  Laterality: N/A;   FACIAL COSMETIC SURGERY     HAND SURGERY     nerve & tendon repair   LIPOSUCTION     LUMBAR EPIDURAL INJECTION  06/16/2015   SKIN BIOPSY Right 02/22/2016   Procedure: BIOPSY SKIN;  Surgeon: Bobie FORBES Cathlyn JAYSON Nikki, MD;  Location: [facility];  Service: Gynecology;  Laterality: Right;   SQUAMOUS CELL CARCINOMA EXCISION     WISDOM TOOTH EXTRACTION      FAMHx:  Family History  Problem Relation Age of Onset   Stroke Father    Depression Father    Stroke Mother    Hypertension Brother    Heart disease Brother        stint...PACER   Heart attack Brother    CAD Brother    Heart attack Maternal Uncle    Heart attack Maternal Grandmother    Heart attack Brother    Non-Hodgkin's lymphoma Brother    CAD Brother    Heart attack Maternal Uncle    Heart attack Maternal Uncle    Heart attack Maternal Uncle    Heart attack Maternal Uncle    Cholesteatoma Sister    Hyperlipidemia Sister    Other Brother        SUICIDE 05/2016    SOCHx:   reports that she has quit smoking. She has never used smokeless tobacco. She reports that she does not drink alcohol and does not use drugs.  ALLERGIES:  Allergies  Allergen Reactions   Codeine Other (See Comments)    headache   Crestor [Rosuvastatin] Other (See Comments)    Muscle aches   Other Nausea And Vomiting    Anesthesia-severe vomiting   Simvastatin Other (See Comments)    Muscle aches   Shellfish Allergy Other (See Comments)    Throat swelling   Statins     ROS: Pertinent items noted in HPI and remainder of comprehensive ROS otherwise negative.  HOME MEDS: Current Outpatient Medications on File Prior to Visit  Medication Sig Dispense Refill   acetaminophen   (TYLENOL ) 500 MG tablet Take 800 mg by mouth every 6 (six) hours as needed.     albuterol  (VENTOLIN  HFA) 108 (90 Base) MCG/ACT inhaler Inhale 2 puffs into the lungs every 6 (six) hours as needed for up to 10 days for wheezing or shortness of breath. 1 each 0   ALPRAZolam  (XANAX ) 0.5 MG tablet TAKE 1/2 TO 1 TABLET BY MOUTH TWICE DAILY  AS NEEDED 60 tablet 5   Ascorbic Acid 125 MG CHEW Chew by mouth.     aspirin  325 MG tablet Take 325 mg by mouth daily.     benzonatate  (TESSALON ) 100 MG capsule Take 1 capsule (100 mg total) by mouth every 8 (eight) hours. 21 capsule 0   Bioflavonoid Products (VITAMIN C) CHEW Chew by mouth.     cloNIDine  (CATAPRES ) 0.1 MG tablet Take 1 tablet (0.1 mg total) by mouth daily. 90 tablet 2   clotrimazole-betamethasone (LOTRISONE) cream Apply topically 2 (two) times daily.     Evolocumab  (REPATHA  SURECLICK) 140 MG/ML SOAJ INJECT 1 DOSE INTO THE SKIN EVERY 14 DAYS 6 mL 5   levothyroxine  (SYNTHROID ) 25 MCG tablet Take 37.5 mcg by mouth every morning.     magnesium  oxide (MAG-OX) 400 MG tablet Take 400 mg by mouth daily.     metroNIDAZOLE  (METROCREAM ) 0.75 % cream APPLY EXTERNALLY TO AFFECTED AREA OF ACNE TWICE DAILY     predniSONE  (STERAPRED UNI-PAK 21 TAB) 10 MG (21) TBPK tablet Take as directed 21 tablet 0   sertraline  (ZOLOFT ) 100 MG tablet Take 1 tablet (100 mg total) by mouth daily. 90 tablet 2   telmisartan (MICARDIS) 40 MG tablet Take 40 mg by mouth daily.   0   VITAMIN D  PO Take by mouth.     VITAMIN E PO Take by mouth.     zolpidem  (AMBIEN ) 5 MG tablet TAKE 1 TABLET BY MOUTH AT BEDTIME AS NEEDED FOR SLEEP 30 tablet 3   ezetimibe  (ZETIA ) 10 MG tablet Take 1 tablet (10 mg total) by mouth daily. 90 tablet 3   No current facility-administered medications on file prior to visit.    LABS/IMAGING: No results found for this or any previous visit (from the past 48 hours). No results found.  LIPID PANEL:    Component Value Date/Time   CHOL 235 (H) 05/25/2014 0535    TRIG 352 (H) 05/25/2014 0535   HDL 38 (L) 05/25/2014 0535   CHOLHDL 6.2 05/25/2014 0535   VLDL 70 (H) 05/25/2014 0535   LDLCALC 127 (H) 05/25/2014 0535    WEIGHTS: Wt Readings from Last 3 Encounters:  01/24/23 174 lb 12.8 oz (79.3 kg)  08/02/22 169 lb 11.2 oz (77 kg)  10/12/21 176 lb 3.2 oz (79.9 kg)    VITALS: BP (!) 156/94 (BP Location: Left Arm, Patient Position: Sitting)   Pulse 80   Wt 174 lb 12.8 oz (79.3 kg)   LMP 07/18/2011   SpO2 97%   BMI 31.97 kg/m   EXAM: Deferred  EKG: EKG Interpretation Date/Time:  Tuesday January 24 2023 13:56:37 EST Ventricular Rate:  80 PR Interval:  166 QRS Duration:  70 QT Interval:  390 QTC Calculation: 449 R Axis:   55  Text Interpretation: Normal sinus rhythm Normal ECG When compared with ECG of 27-Dec-2018 11:44, No significant change was found Confirmed by Mona Kent [phone]) on 01/24/2023 2:01:58 PM    ASSESSMENT: Probable familial hyperlipidemia, 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   Ms. Armacost has had an increase in her cholesterol.  She is compliant with her medicines I suspect it may be diet or less activity that may be leading to her increase in cholesterol numbers.  Will plan repeat lipids in 6 months if she is able to work to improve her cholesterol further.  I have encouraged her to consider taking a  lower dose steroid since she is still very wheezy and congested.  I would recommend 30 mg daily for 3 days then 20 mg daily for 2 days then 10 mg daily thereafter until the pack is completed.  If she does not improve she should follow-up with her primary care provider.  Follow-up with me in 6 months.  Vinie KYM Maxcy, MD, [facility], FACP  [place]  [facility]  Medical Director of the [facility] Lipid Disorders &  Cardiovascular Risk Reduction Clinic Diplomate of the American Board of Clinical  Lipidology Attending Cardiologist  Direct Dial: [phone]  Fax: [phone]  Website:  [email or web address] Vinie BROCKS Shaylen Nephew 01/24/2023, 2:02 PM

## 2023-01-24 NOTE — Patient Instructions (Signed)
 Medication Instructions:  Steroid Dose Pack -- 30mg  x3 days -- 20mg  x2 days -- 10mg  for rest of pack  *If you need a refill on your cardiac medications before your next appointment, please call your pharmacy*   Lab Work: FASTING NMR lipoprofile in 6 months  If you have labs (blood work) drawn today and your tests are completely normal, you will receive your results only by: MyChart Message (if you have MyChart) OR A paper copy in the mail If you have any lab test that is abnormal or we need to change your treatment, we will call you to review the results.   Follow-Up: At [facility], you and your health needs are our priority.  As part of our continuing mission to provide you with exceptional heart care, we have created designated Provider Care Teams.  These Care Teams include your primary Cardiologist (physician) and Advanced Practice Providers (APPs -  Physician Assistants and Nurse Practitioners) who all work together to provide you with the care you need, when you need it.  We recommend signing up for the patient portal called MyChart.  Sign up information is provided on this After Visit Summary.  MyChart is used to connect with patients for Virtual Visits (Telemedicine).  Patients are able to view lab/test results, encounter notes, upcoming appointments, etc.  Non-urgent messages can be sent to your provider as well.   To learn more about what you can do with MyChart, go to [email or web address].    Your next appointment:    6 months with Dr. Mona

## 2023-02-20 ENCOUNTER — Other Ambulatory Visit (HOSPITAL_COMMUNITY): Payer: Self-pay

## 2023-02-20 ENCOUNTER — Telehealth: Payer: Self-pay | Admitting: Pharmacy Technician

## 2023-02-20 ENCOUNTER — Telehealth: Payer: Self-pay | Admitting: Internal Medicine

## 2023-02-20 NOTE — Telephone Encounter (Signed)
Pharmacy Patient Advocate Encounter   Received notification from Pt Calls Messages that prior authorization for Repatha SureClick 140MG /ML auto-injectors is required/requested.   Insurance verification completed.   The patient is insured through [facility] ADVANTAGE/RX ADVANCE .   Per test claim: PA required; PA submitted to above mentioned insurance via CoverMyMeds Key/confirmation #/EOC [id number] Status is pending

## 2023-02-20 NOTE — Telephone Encounter (Signed)
Pharmacy calling to f/u on Prior Auth that was sent via fax on 1/30 for Repatha. Please advise

## 2023-02-20 NOTE — Telephone Encounter (Signed)
PA request has been Submitted. New Encounter created for follow up. For additional info see Pharmacy Prior Auth telephone encounter from 02/20/23.

## 2023-02-20 NOTE — Telephone Encounter (Signed)
Pharmacy Patient Advocate Encounter  Received notification from [facility] ADVANTAGE/RX ADVANCE that Prior Authorization for Repatha SureClick 140MG /ML auto-injectors  has been APPROVED from 02/20/23 to 02/20/24. Spoke to pharmacy to process.Copay is $47.00.    PA #/Case ID/Reference #: [id number]

## 2023-02-20 NOTE — Telephone Encounter (Signed)
PA approved until 02/2024.

## 2023-02-20 NOTE — Telephone Encounter (Signed)
Message sent to PA Rx Team

## 2023-04-27 DIAGNOSIS — E785 Hyperlipidemia, unspecified: Secondary | ICD-10-CM | POA: Diagnosis not present

## 2023-04-27 DIAGNOSIS — I10 Essential (primary) hypertension: Secondary | ICD-10-CM | POA: Diagnosis not present

## 2023-04-27 DIAGNOSIS — E039 Hypothyroidism, unspecified: Secondary | ICD-10-CM | POA: Diagnosis not present

## 2023-04-27 DIAGNOSIS — G4700 Insomnia, unspecified: Secondary | ICD-10-CM | POA: Diagnosis not present

## 2023-04-27 DIAGNOSIS — F419 Anxiety disorder, unspecified: Secondary | ICD-10-CM | POA: Diagnosis not present

## 2023-05-22 DIAGNOSIS — L820 Inflamed seborrheic keratosis: Secondary | ICD-10-CM | POA: Diagnosis not present

## 2023-05-22 DIAGNOSIS — L02214 Cutaneous abscess of groin: Secondary | ICD-10-CM | POA: Diagnosis not present

## 2023-05-22 DIAGNOSIS — L578 Other skin changes due to chronic exposure to nonionizing radiation: Secondary | ICD-10-CM | POA: Diagnosis not present

## 2023-05-22 DIAGNOSIS — L821 Other seborrheic keratosis: Secondary | ICD-10-CM | POA: Diagnosis not present

## 2023-06-21 DIAGNOSIS — E039 Hypothyroidism, unspecified: Secondary | ICD-10-CM | POA: Diagnosis not present

## 2023-06-21 DIAGNOSIS — I10 Essential (primary) hypertension: Secondary | ICD-10-CM | POA: Diagnosis not present

## 2023-06-21 DIAGNOSIS — R203 Hyperesthesia: Secondary | ICD-10-CM | POA: Diagnosis not present

## 2023-06-21 DIAGNOSIS — R531 Weakness: Secondary | ICD-10-CM | POA: Diagnosis not present

## 2023-06-21 DIAGNOSIS — F419 Anxiety disorder, unspecified: Secondary | ICD-10-CM | POA: Diagnosis not present

## 2023-06-21 DIAGNOSIS — E785 Hyperlipidemia, unspecified: Secondary | ICD-10-CM | POA: Diagnosis not present

## 2023-06-21 DIAGNOSIS — M791 Myalgia, unspecified site: Secondary | ICD-10-CM | POA: Diagnosis not present

## 2023-06-21 DIAGNOSIS — M2550 Pain in unspecified joint: Secondary | ICD-10-CM | POA: Diagnosis not present

## 2023-06-25 ENCOUNTER — Ambulatory Visit (HOSPITAL_BASED_OUTPATIENT_CLINIC_OR_DEPARTMENT_OTHER): Admission: EM | Admit: 2023-06-25 | Discharge: 2023-06-25 | Disposition: A | Discharge status: Home / Self Care

## 2023-06-25 ENCOUNTER — Encounter (HOSPITAL_BASED_OUTPATIENT_CLINIC_OR_DEPARTMENT_OTHER): Payer: Self-pay

## 2023-06-25 DIAGNOSIS — R079 Chest pain, unspecified: Secondary | ICD-10-CM | POA: Diagnosis not present

## 2023-06-25 NOTE — Discharge Instructions (Signed)
 Go to the ER.

## 2023-06-25 NOTE — ED Provider Notes (Signed)
 [name]    CSN: [id number] Arrival date & time: 06/25/23  1050      History   Chief Complaint Chief Complaint  Patient presents with   Chest Pain    HPI Vicki [name] is a 73 y.o. female.   73 year old female with significant past medical history to include MI, CVA, fibromyalgia, seizures, PTSD.  She presents today with sudden onset of chest pain midsternal that started upon waking up this morning.  The pain radiates into her jaws and bilateral arms and back.  Patient diaphoretic and weak.  History of MI x 3.  Patient feels as if she is going to pass out.  States "I feel like I am going out".  Vomited twice prior to arrival.   Chest Pain   Past Medical History:  Diagnosis Date   Alcohol intoxication (HCC) 05/2014    ED note    Anxiety    Arthritis    Boils    Chronic back pain    Community acquired pneumonia 01/05/2016   Depression    Fibromyalgia    GERD (gastroesophageal reflux disease)    Heart attack (HCC)    Hepatic cyst 01/06/2016   Hyperlipidemia    Hypertension    Insomnia    Leg cramps    Migraines    with aura   Mini stroke 05/2014   with paralysis for 1 hr   Osteopenia    PONV (postoperative nausea and vomiting)    severe   Postmenopausal bleeding 12/2015   PTSD (post-traumatic stress disorder)    S/P epidural steroid injection    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, passed out, 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 followed up with eye doctor no further issues   Spinal stenosis    Squamous cell carcinoma 12/25/2015   right leg and nose   Strep throat 01/05/2016   Stroke (HCC)    3 strokes, no residual    Patient Active Problem List   Diagnosis Date Noted   S/P lumbar fusion 12/31/2018   PTSD (post-traumatic stress disorder) 11/10/2017   Hypertriglyceridemia 10/07/2015   Abnormal nuclear stress test    Chest pain 07/23/2015   Palpitations 07/23/2015   Depression    Left-sided weakness 05/24/2014   Hyponatremia 05/24/2014   Right arm  numbness 05/24/2014   Alcohol abuse 05/24/2014   Alcohol abuse with intoxication (HCC) 05/24/2014   Syncope and collapse 05/24/2014   Cerebral infarction [facility])    Essential hypertension    Hyperlipidemia 04/04/2013    Past Surgical History:  Procedure Laterality Date   BREAST ENHANCEMENT SURGERY     CARDIAC CATHETERIZATION N/A 08/12/2015   Procedure: Left Heart Cath and Coronary Angiography;  Surgeon: Lucendia Rusk, MD;  Location: [facility] INVASIVE CV LAB;  Service: Cardiovascular;  Laterality: N/A;   COLONOSCOPY     DILATATION & CURETTAGE/HYSTEROSCOPY WITH MYOSURE N/A 02/22/2016   Procedure: DILATATION & CURETTAGE/HYSTEROSCOPY WITH MYOSURE;  Surgeon: Greta Leatherwood, MD;  Location: [facility] ORS;  Service: Gynecology;  Laterality: N/A;   FACIAL COSMETIC SURGERY     HAND SURGERY     nerve & tendon repair   LIPOSUCTION     LUMBAR EPIDURAL INJECTION  06/16/2015   SKIN BIOPSY Right 02/22/2016   Procedure: BIOPSY SKIN;  Surgeon: Greta Leatherwood, MD;  Location: [facility] ORS;  Service: Gynecology;  Laterality: Right;   SQUAMOUS CELL CARCINOMA EXCISION     WISDOM TOOTH EXTRACTION  OB History     Gravida  3   Para  3   Term  3   Preterm      AB      Living  3      SAB      IAB      Ectopic      Multiple      Live Births  3            Home Medications    Prior to Admission medications   Medication Sig Start Date End Date Taking? Authorizing Provider  acetaminophen  (TYLENOL ) 500 MG tablet Take 800 mg by mouth every 6 (six) hours as needed.    [provider]  albuterol  (VENTOLIN  HFA) 108 (90 Base) MCG/ACT inhaler Inhale 2 puffs into the lungs every 6 (six) hours as needed for up to 10 days for wheezing or shortness of breath. 01/19/23 01/29/23  Acevedo, Angela, PA  ALPRAZolam  (XANAX ) 0.5 MG tablet TAKE 1/2 TO 1 TABLET BY MOUTH TWICE DAILY AS NEEDED 04/25/22   Cottle, Carey G Jr., MD  Ascorbic Acid 125 MG CHEW Chew by mouth.    [provider]   aspirin  325 MG tablet Take 325 mg by mouth daily.    [provider]  benzonatate  (TESSALON ) 100 MG capsule Take 1 capsule (100 mg total) by mouth every 8 (eight) hours. 01/19/23   Acevedo, Angela, PA  Bioflavonoid Products (VITAMIN C) CHEW Chew by mouth.    [provider]  cloNIDine  (CATAPRES ) 0.1 MG tablet Take 1 tablet (0.1 mg total) by mouth daily. 10/17/22   Cottle, Kennedy Peabody., MD  clotrimazole-betamethasone (LOTRISONE) cream Apply topically 2 (two) times daily. 07/07/21   [provider]  Evolocumab  (REPATHA  SURECLICK) 140 MG/ML SOAJ INJECT 1 DOSE INTO THE SKIN EVERY 14 DAYS 11/11/22   Hilty, Aviva Lemmings, MD  ezetimibe  (ZETIA ) 10 MG tablet Take 1 tablet (10 mg total) by mouth daily. 10/03/22 01/24/23  Hazle Lites, MD  levothyroxine (SYNTHROID) 25 MCG tablet Take 37.5 mcg by mouth every morning. Patient not taking: Reported on 01/24/2023 10/05/21   [provider]  levothyroxine (SYNTHROID) 50 MCG tablet Take 50 mcg by mouth daily. 01/02/23   [provider]  magnesium  oxide (MAG-OX) 400 MG tablet Take 400 mg by mouth daily.    [provider]  metroNIDAZOLE  (METROCREAM ) 0.75 % cream APPLY EXTERNALLY TO AFFECTED AREA OF ACNE TWICE DAILY 11/07/20   [provider]  mupirocin ointment (BACTROBAN) 2 % Apply 1 Application topically 2 (two) times daily. 12/08/22   [provider]  predniSONE  (STERAPRED UNI-PAK 21 TAB) 10 MG (21) TBPK tablet Take as directed 01/19/23   Acevedo, Angela, PA  sertraline  (ZOLOFT ) 100 MG tablet Take 1 tablet (100 mg total) by mouth daily. 10/17/22   Cottle, Kennedy Peabody., MD  telmisartan (MICARDIS) 40 MG tablet Take 40 mg by mouth daily.  09/11/17   [provider]  VITAMIN D  PO Take by mouth.    [provider]  VITAMIN E PO Take by mouth.    [provider]  zolpidem  (AMBIEN ) 5 MG tablet TAKE 1 TABLET BY MOUTH AT BEDTIME AS NEEDED FOR SLEEP 10/18/22   Cottle, Kennedy Peabody., MD     Family History Family History  Problem Relation Age of Onset   Stroke Father    Depression Father    Stroke Mother    Hypertension Brother    Heart disease Brother  stint...PACER   Heart attack Brother    CAD Brother    Heart attack Maternal Uncle    Heart attack Maternal Grandmother    Heart attack Brother    Non-Hodgkin's lymphoma Brother    CAD Brother    Heart attack Maternal Uncle    Heart attack Maternal Uncle    Heart attack Maternal Uncle    Heart attack Maternal Uncle    Cholesteatoma Sister    Hyperlipidemia Sister    Other Brother        SUICIDE 05/2016    Social History Social History   Tobacco Use   Smoking status: Former   Smokeless tobacco: Never  Advertising account planner   Vaping status: Never Used  Substance Use Topics   Alcohol use: No   Drug use: No     Allergies   Codeine, Crestor [rosuvastatin], Other, Simvastatin, Shellfish allergy, and Statins   Review of Systems Review of Systems  Cardiovascular:  Positive for chest pain.    See HPI Physical Exam Triage Vital Signs ED Triage Vitals  Encounter Vitals Group     BP 06/25/23 1108 (!) 181/103     Systolic BP Percentile --      Diastolic BP Percentile --      Pulse Rate 06/25/23 1108 71     Resp 06/25/23 1108 20     Temp 06/25/23 1108 98.7 F (37.1 C)     Temp Source 06/25/23 1108 Oral     SpO2 06/25/23 1108 98 %     Weight --      Height --      Head Circumference --      Peak Flow --      Pain Score 06/25/23 1109 8     Pain Loc --      Pain Education --      Exclude from Growth Chart --    No data found.  Updated Vital Signs BP (!) 181/103 (BP Location: Right Arm)   Pulse 71   Temp 98.7 F (37.1 C) (Oral)   Resp 20   LMP 07/18/2011   SpO2 98%   Visual Acuity Right Eye Distance:   Left Eye Distance:   Bilateral Distance:    Right Eye Near:   Left Eye Near:    Bilateral Near:     Physical Exam Constitutional:      Appearance: She is ill-appearing and  diaphoretic.  Cardiovascular:     Rate and Rhythm: Normal rate and regular rhythm.     Heart sounds: Normal heart sounds.  Pulmonary:     Effort: Pulmonary effort is normal.     Breath sounds: Normal breath sounds.  Skin:    General: Skin is warm.  Neurological:     Mental Status: She is alert.      UC Treatments / Results  Labs (all labs ordered are listed, but only abnormal results are displayed) Labs Reviewed - No data to display  EKG   Radiology No results found.  Procedures Procedures (including critical care time)  Medications Ordered in UC Medications - No data to display  Initial Impression / Assessment and Plan / UC Course  I have reviewed the triage vital signs and the nursing notes.  Pertinent labs & imaging results that were available during my care of the patient were reviewed by me and considered in my medical decision making (see chart for details).     Chest pain-patient with a sudden onset of chest pain with radiation  into her arms, jaw, weakness, diaphoresis that started this morning.  She has had 2 episodes of vomiting.  She has significant past medical history of multiple MIs, CVA and hypertension.  She is hypertensive today.  Based on symptoms and past medical history recommendations are to go to the ER via ambulance.  Patient refused transport by EMS and will have her husband take her.  Discussed risk of doing this.  Understanding and agreed. Final Clinical Impressions(s) / UC Diagnoses   Final diagnoses:  Chest pain, unspecified type     Discharge Instructions      Go to the ER   ED Prescriptions   None    PDMP not reviewed this encounter.   Landa Pine, FNP 06/25/23 [phone]

## 2023-06-25 NOTE — ED Notes (Signed)
 Patient is being discharged from the Urgent Care and sent to the Emergency Department via pov . Per patient demands, patient is in need of higher level of care due to chest pain and cardiac history. Patient is aware and verbalizes understanding of plan of care.  Vitals:   06/25/23 1108  BP: (!) 181/103  Pulse: 71  Resp: 20  Temp: 98.7 F (37.1 C)  SpO2: 98%

## 2023-06-25 NOTE — ED Triage Notes (Signed)
 Midsternal chest pain with radiation to back and into both jaws. Patient diaphoretic and weak. Reports hx of MI x 3 and several strokes. Patient does not ambulate well and feeling more weak. Symptoms onset 1025. Patient keeps stating she feels as if she is going to pass out. Emesis x 2 prior to arrival. Patient brought to exam room immediately with provider at bedside.

## 2023-06-25 NOTE — ED Notes (Signed)
 EKG performed. Provider discussing with patient and patient's spouse that EMS transport to ER strongly encouraged. Patient is reporting she feels a little better and will have her spouse take her. Provider and this nurse again strongly encouraging patient be transferred by EMS in the event she have further symptoms or deterioration.  Patient refusing. Spouse states he will take patient to [facility] as that is where she has had cardiology studies done.

## 2023-06-27 ENCOUNTER — Inpatient Hospital Stay (HOSPITAL_COMMUNITY)

## 2023-06-27 ENCOUNTER — Encounter (HOSPITAL_COMMUNITY): Payer: Self-pay

## 2023-06-27 ENCOUNTER — Inpatient Hospital Stay (HOSPITAL_COMMUNITY)
Admission: EM | Admit: 2023-06-27 | Discharge: 2023-07-14 | DRG: 215 | Disposition: A | Discharge status: Home Health | Attending: Cardiovascular Disease | Admitting: Cardiovascular Disease

## 2023-06-27 ENCOUNTER — Other Ambulatory Visit: Payer: Self-pay

## 2023-06-27 ENCOUNTER — Emergency Department (HOSPITAL_COMMUNITY)

## 2023-06-27 ENCOUNTER — Encounter (HOSPITAL_COMMUNITY)
Admission: EM | Disposition: A | Discharge status: Home Health | Payer: Self-pay | Source: Home / Self Care | Attending: Internal Medicine

## 2023-06-27 DIAGNOSIS — K761 Chronic passive congestion of liver: Secondary | ICD-10-CM | POA: Diagnosis present

## 2023-06-27 DIAGNOSIS — E871 Hypo-osmolality and hyponatremia: Secondary | ICD-10-CM | POA: Diagnosis present

## 2023-06-27 DIAGNOSIS — R570 Cardiogenic shock: Secondary | ICD-10-CM | POA: Diagnosis not present

## 2023-06-27 DIAGNOSIS — I214 Non-ST elevation (NSTEMI) myocardial infarction: Secondary | ICD-10-CM | POA: Diagnosis present

## 2023-06-27 DIAGNOSIS — E7849 Other hyperlipidemia: Secondary | ICD-10-CM | POA: Diagnosis present

## 2023-06-27 DIAGNOSIS — K219 Gastro-esophageal reflux disease without esophagitis: Secondary | ICD-10-CM | POA: Diagnosis present

## 2023-06-27 DIAGNOSIS — Z950 Presence of cardiac pacemaker: Secondary | ICD-10-CM

## 2023-06-27 DIAGNOSIS — T827XXA Infection and inflammatory reaction due to other cardiac and vascular devices, implants and grafts, initial encounter: Secondary | ICD-10-CM | POA: Diagnosis not present

## 2023-06-27 DIAGNOSIS — K7200 Acute and subacute hepatic failure without coma: Secondary | ICD-10-CM | POA: Diagnosis not present

## 2023-06-27 DIAGNOSIS — N170 Acute kidney failure with tubular necrosis: Secondary | ICD-10-CM | POA: Diagnosis not present

## 2023-06-27 DIAGNOSIS — I5031 Acute diastolic (congestive) heart failure: Secondary | ICD-10-CM | POA: Diagnosis not present

## 2023-06-27 DIAGNOSIS — Y838 Other surgical procedures as the cause of abnormal reaction of the patient, or of later complication, without mention of misadventure at the time of the procedure: Secondary | ICD-10-CM | POA: Diagnosis not present

## 2023-06-27 DIAGNOSIS — I517 Cardiomegaly: Secondary | ICD-10-CM | POA: Diagnosis not present

## 2023-06-27 DIAGNOSIS — R6884 Jaw pain: Secondary | ICD-10-CM | POA: Diagnosis present

## 2023-06-27 DIAGNOSIS — I252 Old myocardial infarction: Secondary | ICD-10-CM

## 2023-06-27 DIAGNOSIS — I480 Paroxysmal atrial fibrillation: Secondary | ICD-10-CM | POA: Diagnosis not present

## 2023-06-27 DIAGNOSIS — E875 Hyperkalemia: Secondary | ICD-10-CM | POA: Diagnosis present

## 2023-06-27 DIAGNOSIS — Z8673 Personal history of transient ischemic attack (TIA), and cerebral infarction without residual deficits: Secondary | ICD-10-CM

## 2023-06-27 DIAGNOSIS — Z83438 Family history of other disorder of lipoprotein metabolism and other lipidemia: Secondary | ICD-10-CM

## 2023-06-27 DIAGNOSIS — H5440 Blindness, one eye, unspecified eye: Secondary | ICD-10-CM | POA: Diagnosis present

## 2023-06-27 DIAGNOSIS — R001 Bradycardia, unspecified: Secondary | ICD-10-CM

## 2023-06-27 DIAGNOSIS — R531 Weakness: Secondary | ICD-10-CM | POA: Diagnosis not present

## 2023-06-27 DIAGNOSIS — I71012 Dissection of descending thoracic aorta: Secondary | ICD-10-CM | POA: Diagnosis present

## 2023-06-27 DIAGNOSIS — E039 Hypothyroidism, unspecified: Secondary | ICD-10-CM | POA: Diagnosis not present

## 2023-06-27 DIAGNOSIS — I509 Heart failure, unspecified: Secondary | ICD-10-CM | POA: Insufficient documentation

## 2023-06-27 DIAGNOSIS — F4310 Post-traumatic stress disorder, unspecified: Secondary | ICD-10-CM | POA: Diagnosis present

## 2023-06-27 DIAGNOSIS — Z91013 Allergy to seafood: Secondary | ICD-10-CM

## 2023-06-27 DIAGNOSIS — A412 Sepsis due to unspecified staphylococcus: Secondary | ICD-10-CM | POA: Diagnosis not present

## 2023-06-27 DIAGNOSIS — J9601 Acute respiratory failure with hypoxia: Secondary | ICD-10-CM | POA: Diagnosis present

## 2023-06-27 DIAGNOSIS — M797 Fibromyalgia: Secondary | ICD-10-CM | POA: Diagnosis present

## 2023-06-27 DIAGNOSIS — J984 Other disorders of lung: Secondary | ICD-10-CM | POA: Diagnosis not present

## 2023-06-27 DIAGNOSIS — Z85828 Personal history of other malignant neoplasm of skin: Secondary | ICD-10-CM

## 2023-06-27 DIAGNOSIS — D696 Thrombocytopenia, unspecified: Secondary | ICD-10-CM | POA: Diagnosis not present

## 2023-06-27 DIAGNOSIS — I110 Hypertensive heart disease with heart failure: Principal | ICD-10-CM | POA: Diagnosis present

## 2023-06-27 DIAGNOSIS — J811 Chronic pulmonary edema: Secondary | ICD-10-CM | POA: Diagnosis not present

## 2023-06-27 DIAGNOSIS — Z7982 Long term (current) use of aspirin: Secondary | ICD-10-CM

## 2023-06-27 DIAGNOSIS — J9811 Atelectasis: Secondary | ICD-10-CM | POA: Diagnosis not present

## 2023-06-27 DIAGNOSIS — R0989 Other specified symptoms and signs involving the circulatory and respiratory systems: Secondary | ICD-10-CM | POA: Diagnosis not present

## 2023-06-27 DIAGNOSIS — I50811 Acute right heart failure: Secondary | ICD-10-CM | POA: Diagnosis present

## 2023-06-27 DIAGNOSIS — R7881 Bacteremia: Secondary | ICD-10-CM | POA: Insufficient documentation

## 2023-06-27 DIAGNOSIS — I4891 Unspecified atrial fibrillation: Secondary | ICD-10-CM | POA: Diagnosis not present

## 2023-06-27 DIAGNOSIS — Z823 Family history of stroke: Secondary | ICD-10-CM

## 2023-06-27 DIAGNOSIS — E8729 Other acidosis: Secondary | ICD-10-CM

## 2023-06-27 DIAGNOSIS — D649 Anemia, unspecified: Secondary | ICD-10-CM | POA: Diagnosis not present

## 2023-06-27 DIAGNOSIS — E872 Acidosis, unspecified: Secondary | ICD-10-CM | POA: Diagnosis present

## 2023-06-27 DIAGNOSIS — Z452 Encounter for adjustment and management of vascular access device: Secondary | ICD-10-CM | POA: Diagnosis not present

## 2023-06-27 DIAGNOSIS — Z79899 Other long term (current) drug therapy: Secondary | ICD-10-CM

## 2023-06-27 DIAGNOSIS — R0602 Shortness of breath: Secondary | ICD-10-CM | POA: Diagnosis not present

## 2023-06-27 DIAGNOSIS — Z7901 Long term (current) use of anticoagulants: Secondary | ICD-10-CM

## 2023-06-27 DIAGNOSIS — I5082 Biventricular heart failure: Secondary | ICD-10-CM | POA: Diagnosis not present

## 2023-06-27 DIAGNOSIS — R112 Nausea with vomiting, unspecified: Secondary | ICD-10-CM | POA: Diagnosis not present

## 2023-06-27 DIAGNOSIS — R0789 Other chest pain: Secondary | ICD-10-CM | POA: Diagnosis not present

## 2023-06-27 DIAGNOSIS — I5040 Unspecified combined systolic (congestive) and diastolic (congestive) heart failure: Secondary | ICD-10-CM | POA: Diagnosis not present

## 2023-06-27 DIAGNOSIS — D6959 Other secondary thrombocytopenia: Secondary | ICD-10-CM | POA: Diagnosis not present

## 2023-06-27 DIAGNOSIS — I4729 Other ventricular tachycardia: Secondary | ICD-10-CM | POA: Diagnosis not present

## 2023-06-27 DIAGNOSIS — Z4682 Encounter for fitting and adjustment of non-vascular catheter: Secondary | ICD-10-CM | POA: Diagnosis not present

## 2023-06-27 DIAGNOSIS — G4700 Insomnia, unspecified: Secondary | ICD-10-CM | POA: Diagnosis present

## 2023-06-27 DIAGNOSIS — K760 Fatty (change of) liver, not elsewhere classified: Secondary | ICD-10-CM | POA: Diagnosis not present

## 2023-06-27 DIAGNOSIS — Z807 Family history of other malignant neoplasms of lymphoid, hematopoietic and related tissues: Secondary | ICD-10-CM

## 2023-06-27 DIAGNOSIS — I700 Atherosclerosis of aorta: Secondary | ICD-10-CM | POA: Diagnosis not present

## 2023-06-27 DIAGNOSIS — B957 Other staphylococcus as the cause of diseases classified elsewhere: Secondary | ICD-10-CM | POA: Insufficient documentation

## 2023-06-27 DIAGNOSIS — I50810 Right heart failure, unspecified: Secondary | ICD-10-CM | POA: Diagnosis not present

## 2023-06-27 DIAGNOSIS — R918 Other nonspecific abnormal finding of lung field: Secondary | ICD-10-CM | POA: Diagnosis not present

## 2023-06-27 DIAGNOSIS — Z888 Allergy status to other drugs, medicaments and biological substances status: Secondary | ICD-10-CM

## 2023-06-27 DIAGNOSIS — R072 Precordial pain: Secondary | ICD-10-CM | POA: Diagnosis not present

## 2023-06-27 DIAGNOSIS — I3139 Other pericardial effusion (noninflammatory): Secondary | ICD-10-CM | POA: Diagnosis not present

## 2023-06-27 DIAGNOSIS — N179 Acute kidney failure, unspecified: Secondary | ICD-10-CM

## 2023-06-27 DIAGNOSIS — Z9882 Breast implant status: Secondary | ICD-10-CM

## 2023-06-27 DIAGNOSIS — Z79890 Hormone replacement therapy (postmenopausal): Secondary | ICD-10-CM

## 2023-06-27 DIAGNOSIS — M436 Torticollis: Secondary | ICD-10-CM | POA: Diagnosis present

## 2023-06-27 DIAGNOSIS — I4892 Unspecified atrial flutter: Secondary | ICD-10-CM | POA: Diagnosis not present

## 2023-06-27 DIAGNOSIS — Z885 Allergy status to narcotic agent status: Secondary | ICD-10-CM

## 2023-06-27 DIAGNOSIS — Z818 Family history of other mental and behavioral disorders: Secondary | ICD-10-CM

## 2023-06-27 DIAGNOSIS — R079 Chest pain, unspecified: Secondary | ICD-10-CM | POA: Diagnosis not present

## 2023-06-27 DIAGNOSIS — I493 Ventricular premature depolarization: Secondary | ICD-10-CM | POA: Diagnosis present

## 2023-06-27 DIAGNOSIS — I7100 Dissection of unspecified site of aorta: Secondary | ICD-10-CM | POA: Diagnosis not present

## 2023-06-27 DIAGNOSIS — I495 Sick sinus syndrome: Secondary | ICD-10-CM | POA: Diagnosis not present

## 2023-06-27 DIAGNOSIS — E785 Hyperlipidemia, unspecified: Secondary | ICD-10-CM | POA: Diagnosis present

## 2023-06-27 DIAGNOSIS — Z8249 Family history of ischemic heart disease and other diseases of the circulatory system: Secondary | ICD-10-CM

## 2023-06-27 DIAGNOSIS — F17200 Nicotine dependence, unspecified, uncomplicated: Secondary | ICD-10-CM | POA: Diagnosis present

## 2023-06-27 DIAGNOSIS — R5381 Other malaise: Secondary | ICD-10-CM | POA: Diagnosis present

## 2023-06-27 DIAGNOSIS — R2681 Unsteadiness on feet: Secondary | ICD-10-CM | POA: Diagnosis not present

## 2023-06-27 DIAGNOSIS — R9389 Abnormal findings on diagnostic imaging of other specified body structures: Secondary | ICD-10-CM | POA: Diagnosis not present

## 2023-06-27 HISTORY — PX: VENTRICULAR ASSIST DEVICE INSERTION: CATH118273

## 2023-06-27 HISTORY — PX: RIGHT/LEFT HEART CATH AND CORONARY ANGIOGRAPHY: CATH118266

## 2023-06-27 HISTORY — DX: Cardiogenic shock: R57.0

## 2023-06-27 LAB — COMPREHENSIVE METABOLIC PANEL WITH GFR
ALT: 123 U/L — ABNORMAL HIGH (ref 0–44)
ALT: 124 U/L — ABNORMAL HIGH (ref 0–44)
AST: 123 U/L — ABNORMAL HIGH (ref 15–41)
AST: 131 U/L — ABNORMAL HIGH (ref 15–41)
Albumin: 3 g/dL — ABNORMAL LOW (ref 3.5–5.0)
Albumin: 3 g/dL — ABNORMAL LOW (ref 3.5–5.0)
Alkaline Phosphatase: 62 U/L (ref 38–126)
Alkaline Phosphatase: 57 U/L (ref 38–126)
Anion gap: 16 — ABNORMAL HIGH (ref 5–15)
Anion gap: 8 (ref 5–15)
BUN: 31 mg/dL — ABNORMAL HIGH (ref 8–23)
BUN: 29 mg/dL — ABNORMAL HIGH (ref 8–23)
CO2: 19 mmol/L — ABNORMAL LOW (ref 22–32)
CO2: 22 mmol/L (ref 22–32)
Calcium: 8.6 mg/dL — ABNORMAL LOW (ref 8.9–10.3)
Calcium: 8.1 mg/dL — ABNORMAL LOW (ref 8.9–10.3)
Chloride: 99 mmol/L (ref 98–111)
Chloride: 99 mmol/L (ref 98–111)
Creatinine, Ser: 1.49 mg/dL — ABNORMAL HIGH (ref 0.44–1.00)
Creatinine, Ser: 1.18 mg/dL — ABNORMAL HIGH (ref 0.44–1.00)
GFR, Estimated: 37 mL/min — ABNORMAL LOW (ref >60)
GFR, Estimated: 49 mL/min — ABNORMAL LOW (ref >60)
Glucose, Bld: 110 mg/dL — ABNORMAL HIGH (ref 70–99)
Glucose, Bld: 118 mg/dL — ABNORMAL HIGH (ref 70–99)
Potassium: 4.1 mmol/L (ref 3.5–5.1)
Potassium: 4.2 mmol/L (ref 3.5–5.1)
Sodium: 134 mmol/L — ABNORMAL LOW (ref 135–145)
Sodium: 129 mmol/L — ABNORMAL LOW (ref 135–145)
Total Bilirubin: 0.8 mg/dL (ref 0.0–1.2)
Total Bilirubin: 0.8 mg/dL (ref 0.0–1.2)
Total Protein: 5.3 g/dL — ABNORMAL LOW (ref 6.5–8.1)
Total Protein: 5.3 g/dL — ABNORMAL LOW (ref 6.5–8.1)

## 2023-06-27 LAB — POCT I-STAT EG7
Acid-base deficit: 10 mmol/L — ABNORMAL HIGH (ref 0.0–2.0)
Acid-base deficit: 9 mmol/L — ABNORMAL HIGH (ref 0.0–2.0)
Bicarbonate: 16.8 mmol/L — ABNORMAL LOW (ref 20.0–28.0)
Bicarbonate: 17 mmol/L — ABNORMAL LOW (ref 20.0–28.0)
Calcium, Ion: 1.01 mmol/L — ABNORMAL LOW (ref 1.15–1.40)
Calcium, Ion: 1.07 mmol/L — ABNORMAL LOW (ref 1.15–1.40)
HCT: 37 % (ref 36.0–46.0)
HCT: 39 % (ref 36.0–46.0)
Hemoglobin: 12.6 g/dL (ref 12.0–15.0)
Hemoglobin: 13.3 g/dL (ref 12.0–15.0)
O2 Saturation: 39 %
O2 Saturation: 41 %
Potassium: 3.7 mmol/L (ref 3.5–5.1)
Potassium: 3.9 mmol/L (ref 3.5–5.1)
Sodium: 125 mmol/L — ABNORMAL LOW (ref 135–145)
Sodium: 130 mmol/L — ABNORMAL LOW (ref 135–145)
TCO2: 18 mmol/L — ABNORMAL LOW (ref 22–32)
TCO2: 18 mmol/L — ABNORMAL LOW (ref 22–32)
pCO2, Ven: 38 mmHg — ABNORMAL LOW (ref 44–60)
pCO2, Ven: 39 mmHg — ABNORMAL LOW (ref 44–60)
pH, Ven: 7.243 — ABNORMAL LOW (ref 7.25–7.43)
pH, Ven: 7.259 (ref 7.25–7.43)
pO2, Ven: 26 mmHg — CL (ref 32–45)
pO2, Ven: 27 mmHg — CL (ref 32–45)

## 2023-06-27 LAB — CBC WITH DIFFERENTIAL/PLATELET
Abs Immature Granulocytes: 0 10*3/uL (ref 0.00–0.07)
Abs Immature Granulocytes: 0.19 10*3/uL — ABNORMAL HIGH (ref 0.00–0.07)
Basophils Absolute: 0 10*3/uL (ref 0.0–0.1)
Basophils Absolute: 0.1 10*3/uL (ref 0.0–0.1)
Basophils Relative: 0 %
Basophils Relative: 0 %
Eosinophils Absolute: 0 10*3/uL (ref 0.0–0.5)
Eosinophils Absolute: 0 10*3/uL (ref 0.0–0.5)
Eosinophils Relative: 0 %
Eosinophils Relative: 0 %
HCT: 39.8 % (ref 36.0–46.0)
HCT: 38.4 % (ref 36.0–46.0)
Hemoglobin: 13.1 g/dL (ref 12.0–15.0)
Hemoglobin: 13.3 g/dL (ref 12.0–15.0)
Immature Granulocytes: 1 %
Lymphocytes Relative: 19 %
Lymphocytes Relative: 12 %
Lymphs Abs: 3 10*3/uL (ref 0.7–4.0)
Lymphs Abs: 2.6 10*3/uL (ref 0.7–4.0)
MCH: 32 pg (ref 26.0–34.0)
MCH: 32.8 pg (ref 26.0–34.0)
MCHC: 32.9 g/dL (ref 30.0–36.0)
MCHC: 34.6 g/dL (ref 30.0–36.0)
MCV: 97.3 fL (ref 80.0–100.0)
MCV: 94.6 fL (ref 80.0–100.0)
Monocytes Absolute: 0.9 10*3/uL (ref 0.1–1.0)
Monocytes Absolute: 1.5 10*3/uL — ABNORMAL HIGH (ref 0.1–1.0)
Monocytes Relative: 6 %
Monocytes Relative: 7 %
Neutro Abs: 11.7 10*3/uL — ABNORMAL HIGH (ref 1.7–7.7)
Neutro Abs: 16.6 10*3/uL — ABNORMAL HIGH (ref 1.7–7.7)
Neutrophils Relative %: 75 %
Neutrophils Relative %: 80 %
Platelets: 153 10*3/uL (ref 150–400)
Platelets: 168 10*3/uL (ref 150–400)
RBC: 4.09 MIL/uL (ref 3.87–5.11)
RBC: 4.06 MIL/uL (ref 3.87–5.11)
RDW: 13.5 % (ref 11.5–15.5)
RDW: 13.3 % (ref 11.5–15.5)
WBC: 15.6 10*3/uL — ABNORMAL HIGH (ref 4.0–10.5)
WBC: 21 10*3/uL — ABNORMAL HIGH (ref 4.0–10.5)
nRBC: 0 % (ref 0.0–0.2)
nRBC: 0 /100{WBCs}
nRBC: 0 % (ref 0.0–0.2)

## 2023-06-27 LAB — POCT I-STAT 7, (LYTES, BLD GAS, ICA,H+H)
Acid-base deficit: 9 mmol/L — ABNORMAL HIGH (ref 0.0–2.0)
Acid-base deficit: 7 mmol/L — ABNORMAL HIGH (ref 0.0–2.0)
Bicarbonate: 14.4 mmol/L — ABNORMAL LOW (ref 20.0–28.0)
Bicarbonate: 17 mmol/L — ABNORMAL LOW (ref 20.0–28.0)
Calcium, Ion: 1.04 mmol/L — ABNORMAL LOW (ref 1.15–1.40)
Calcium, Ion: 1.17 mmol/L (ref 1.15–1.40)
HCT: 38 % (ref 36.0–46.0)
HCT: 38 % (ref 36.0–46.0)
Hemoglobin: 12.9 g/dL (ref 12.0–15.0)
Hemoglobin: 12.9 g/dL (ref 12.0–15.0)
O2 Saturation: 96 %
O2 Saturation: 93 %
Patient temperature: 37.3
Potassium: 4.1 mmol/L (ref 3.5–5.1)
Potassium: 3.8 mmol/L (ref 3.5–5.1)
Sodium: 133 mmol/L — ABNORMAL LOW (ref 135–145)
Sodium: 131 mmol/L — ABNORMAL LOW (ref 135–145)
TCO2: 15 mmol/L — ABNORMAL LOW (ref 22–32)
TCO2: 18 mmol/L — ABNORMAL LOW (ref 22–32)
pCO2 arterial: 25.5 mmHg — ABNORMAL LOW (ref 32–48)
pCO2 arterial: 28.1 mmHg — ABNORMAL LOW (ref 32–48)
pH, Arterial: 7.361 (ref 7.35–7.45)
pH, Arterial: 7.39 (ref 7.35–7.45)
pO2, Arterial: 84 mmHg (ref 83–108)
pO2, Arterial: 67 mmHg — ABNORMAL LOW (ref 83–108)

## 2023-06-27 LAB — GLUCOSE, CAPILLARY: Glucose-Capillary: 112 mg/dL — ABNORMAL HIGH (ref 70–99)

## 2023-06-27 LAB — ECHOCARDIOGRAM COMPLETE
Height: 63 in
Weight: 2560 [oz_av]

## 2023-06-27 LAB — I-STAT VENOUS BLOOD GAS, ED
Acid-base deficit: 8 mmol/L — ABNORMAL HIGH (ref 0.0–2.0)
Bicarbonate: 19.5 mmol/L — ABNORMAL LOW (ref 20.0–28.0)
Calcium, Ion: 1.12 mmol/L — ABNORMAL LOW (ref 1.15–1.40)
HCT: 39 % (ref 36.0–46.0)
Hemoglobin: 13.3 g/dL (ref 12.0–15.0)
O2 Saturation: 95 %
Potassium: 4 mmol/L (ref 3.5–5.1)
Sodium: 133 mmol/L — ABNORMAL LOW (ref 135–145)
TCO2: 21 mmol/L — ABNORMAL LOW (ref 22–32)
pCO2, Ven: 46.6 mmHg (ref 44–60)
pH, Ven: 7.23 — ABNORMAL LOW (ref 7.25–7.43)
pO2, Ven: 88 mmHg — ABNORMAL HIGH (ref 32–45)

## 2023-06-27 LAB — CG4 I-STAT (LACTIC ACID)
Lactic Acid, Venous: 3 mmol/L (ref 0.5–1.9)
Lactic Acid, Venous: 2.3 mmol/L (ref 0.5–1.9)

## 2023-06-27 LAB — I-STAT CG4 LACTIC ACID, ED: Lactic Acid, Venous: 5.3 mmol/L (ref 0.5–1.9)

## 2023-06-27 LAB — I-STAT CHEM 8, ED
BUN: 32 mg/dL — ABNORMAL HIGH (ref 8–23)
Calcium, Ion: 1.04 mmol/L — ABNORMAL LOW (ref 1.15–1.40)
Chloride: 100 mmol/L (ref 98–111)
Creatinine, Ser: 1.4 mg/dL — ABNORMAL HIGH (ref 0.44–1.00)
Glucose, Bld: 109 mg/dL — ABNORMAL HIGH (ref 70–99)
HCT: 40 % (ref 36.0–46.0)
Hemoglobin: 13.6 g/dL (ref 12.0–15.0)
Potassium: 4 mmol/L (ref 3.5–5.1)
Sodium: 132 mmol/L — ABNORMAL LOW (ref 135–145)
TCO2: 19 mmol/L — ABNORMAL LOW (ref 22–32)

## 2023-06-27 LAB — ECHOCARDIOGRAM LIMITED
Height: 63 in
Weight: 2560 [oz_av]

## 2023-06-27 LAB — MRSA NEXT GEN BY PCR, NASAL: MRSA by PCR Next Gen: NOT DETECTED

## 2023-06-27 LAB — COOXEMETRY PANEL
Carboxyhemoglobin: 2.1 % — ABNORMAL HIGH (ref 0.5–1.5)
Methemoglobin: <0.7 % (ref 0.0–1.5)
O2 Saturation: 87.7 %
Total hemoglobin: 11.2 g/dL — ABNORMAL LOW (ref 12.0–16.0)

## 2023-06-27 LAB — TROPONIN I (HIGH SENSITIVITY)
Troponin I (High Sensitivity): 5310 ng/L (ref <18)
Troponin I (High Sensitivity): 3412 ng/L (ref <18)
Troponin I (High Sensitivity): 6049 ng/L (ref <18)

## 2023-06-27 LAB — POCT ACTIVATED CLOTTING TIME
Activated Clotting Time: 245 s
Activated Clotting Time: 187 s
Activated Clotting Time: 164 s
Activated Clotting Time: 135 s
Activated Clotting Time: 129 s

## 2023-06-27 LAB — MAGNESIUM: Magnesium: 1.6 mg/dL — ABNORMAL LOW (ref 1.7–2.4)

## 2023-06-27 LAB — C-REACTIVE PROTEIN: CRP: 6.1 mg/dL — ABNORMAL HIGH (ref <1.0)

## 2023-06-27 LAB — BRAIN NATRIURETIC PEPTIDE: B Natriuretic Peptide: 543 pg/mL — ABNORMAL HIGH (ref 0.0–100.0)

## 2023-06-27 LAB — FIBRINOGEN: Fibrinogen: 374 mg/dL (ref 210–475)

## 2023-06-27 LAB — LACTATE DEHYDROGENASE: LDH: 264 U/L — ABNORMAL HIGH (ref 98–192)

## 2023-06-27 LAB — SEDIMENTATION RATE: Sed Rate: 2 mm/h (ref 0–22)

## 2023-06-27 SURGERY — RIGHT/LEFT HEART CATH AND CORONARY ANGIOGRAPHY
Anesthesia: LOCAL

## 2023-06-27 MED ORDER — HYDROMORPHONE HCL 1 MG/ML IJ SOLN: 0.5000 mg | Freq: Once | INTRAMUSCULAR | Status: DC

## 2023-06-27 MED ORDER — HYDROMORPHONE HCL 1 MG/ML IJ SOLN
0.5000 mg | Freq: Once | INTRAMUSCULAR | Status: AC
  Administered 2023-06-27: 0.5 mg via INTRAVENOUS
  Filled 2023-06-27: qty 1

## 2023-06-27 MED ORDER — NOREPINEPHRINE 4 MG/250ML-% IV SOLN
0.0000 ug/min | INTRAVENOUS | Status: DC
  Filled 2023-06-27: qty 250

## 2023-06-27 MED ORDER — OXYCODONE HCL 5 MG PO TABS
5.0000 mg | ORAL_TABLET | ORAL | Status: DC | PRN
  Administered 2023-06-27 – 2023-07-06 (×9): 5 mg via ORAL
  Filled 2023-06-27 (×9): qty 1

## 2023-06-27 MED ORDER — SERTRALINE HCL 100 MG PO TABS
100.0000 mg | ORAL_TABLET | Freq: Every day | ORAL | Status: DC
  Administered 2023-06-27: 100 mg via ORAL
  Filled 2023-06-27: qty 1

## 2023-06-27 MED ORDER — VERAPAMIL HCL 2.5 MG/ML IV SOLN
INTRAVENOUS | Status: DC | PRN
  Administered 2023-06-27 (×2): 5 mL via INTRA_ARTERIAL

## 2023-06-27 MED ORDER — ASPIRIN 81 MG PO CHEW
81.0000 mg | CHEWABLE_TABLET | Freq: Every day | ORAL | Status: DC
  Administered 2023-06-27 – 2023-07-02 (×6): 81 mg via ORAL
  Filled 2023-06-27 (×6): qty 1

## 2023-06-27 MED ORDER — VERAPAMIL HCL 2.5 MG/ML IV SOLN
INTRAVENOUS | Status: AC
  Filled 2023-06-27: qty 2

## 2023-06-27 MED ORDER — HEPARIN (PORCINE) IN NACL 1000-0.9 UT/500ML-% IV SOLN
INTRAVENOUS | Status: DC | PRN
  Administered 2023-06-27 (×3): 500 mL

## 2023-06-27 MED ORDER — FENTANYL CITRATE (PF) 100 MCG/2ML IJ SOLN
INTRAMUSCULAR | Status: DC | PRN
Start: 2023-06-27 — End: 2023-06-27
  Administered 2023-06-27: 25 ug via INTRAVENOUS

## 2023-06-27 MED ORDER — POTASSIUM CHLORIDE CRYS ER 20 MEQ PO TBCR: 40.0000 meq | EXTENDED_RELEASE_TABLET | Freq: Two times a day (BID) | ORAL | Status: DC

## 2023-06-27 MED ORDER — SODIUM CHLORIDE 0.9 % IV SOLN: 250.0000 mL | INTRAVENOUS | Status: AC | PRN

## 2023-06-27 MED ORDER — SODIUM CHLORIDE 0.9% FLUSH: 3.0000 mL | INTRAVENOUS | Status: DC | PRN

## 2023-06-27 MED ORDER — HEPARIN SODIUM (PORCINE) 1000 UNIT/ML IJ SOLN
INTRAMUSCULAR | Status: AC
  Filled 2023-06-27: qty 10

## 2023-06-27 MED ORDER — FENTANYL CITRATE PF 50 MCG/ML IJ SOSY
25.0000 ug | PREFILLED_SYRINGE | INTRAMUSCULAR | Status: DC | PRN
  Administered 2023-06-27 – 2023-06-28 (×3): 25 ug via INTRAVENOUS
  Filled 2023-06-27 (×3): qty 1

## 2023-06-27 MED ORDER — SODIUM CHLORIDE 0.9 % IV SOLN
250.0000 mL | INTRAVENOUS | Status: AC
  Administered 2023-06-27: 250 mL via INTRAVENOUS

## 2023-06-27 MED ORDER — SODIUM CHLORIDE 0.9% FLUSH
3.0000 mL | Freq: Two times a day (BID) | INTRAVENOUS | Status: DC
  Administered 2023-06-27 – 2023-07-13 (×28): 3 mL via INTRAVENOUS

## 2023-06-27 MED ORDER — ZOLPIDEM TARTRATE 5 MG PO TABS
5.0000 mg | ORAL_TABLET | Freq: Every evening | ORAL | Status: DC | PRN
  Administered 2023-06-29 – 2023-07-02 (×4): 5 mg via ORAL
  Filled 2023-06-27 (×4): qty 1

## 2023-06-27 MED ORDER — HEPARIN SODIUM (PORCINE) 1000 UNIT/ML IJ SOLN
INTRAMUSCULAR | Status: DC | PRN
  Administered 2023-06-27: 6000 [IU] via INTRAVENOUS
  Administered 2023-06-27: 1500 [IU] via INTRAVENOUS

## 2023-06-27 MED ORDER — SODIUM BICARBONATE 8.4 % IV SOLN
INTRAVENOUS | Status: DC
  Filled 2023-06-27 (×3): qty 25

## 2023-06-27 MED ORDER — NOREPINEPHRINE 4 MG/250ML-% IV SOLN
0.0000 ug/min | INTRAVENOUS | Status: DC
  Administered 2023-06-27: 15 ug/min via INTRAVENOUS
  Administered 2023-06-28: 5 ug/min via INTRAVENOUS
  Administered 2023-06-28: 19 ug/min via INTRAVENOUS
  Administered 2023-06-28: 15 ug/min via INTRAVENOUS
  Filled 2023-06-27 (×3): qty 250

## 2023-06-27 MED ORDER — NOREPINEPHRINE 4 MG/250ML-% IV SOLN
0.0000 ug/min | INTRAVENOUS | Status: DC
  Administered 2023-06-27: 2 ug/min via INTRAVENOUS
  Filled 2023-06-27: qty 250

## 2023-06-27 MED ORDER — POTASSIUM CHLORIDE 10 MEQ/50ML IV SOLN
10.0000 meq | INTRAVENOUS | Status: AC
  Administered 2023-06-27 (×2): 10 meq via INTRAVENOUS
  Filled 2023-06-27 (×2): qty 50

## 2023-06-27 MED ORDER — SODIUM CHLORIDE 0.9% IV SOLUTION
0.0000 mL/h | INTRAVENOUS | Status: DC | PRN
  Administered 2023-07-06: 10 mL/h via INTRAVENOUS

## 2023-06-27 MED ORDER — SODIUM BICARBONATE 8.4 % IV SOLN
100.0000 meq | Freq: Once | INTRAVENOUS | Status: AC
  Administered 2023-06-27: 100 meq via INTRAVENOUS
  Filled 2023-06-27: qty 100

## 2023-06-27 MED ORDER — SODIUM CHLORIDE 0.9 % IV SOLN
INTRAVENOUS | Status: AC | PRN
  Administered 2023-06-27 (×2): 10 mL/h via INTRAVENOUS

## 2023-06-27 MED ORDER — GABAPENTIN 300 MG PO CAPS: 300.0000 mg | ORAL_CAPSULE | Freq: Three times a day (TID) | ORAL | 0 refills | Status: DC | PRN

## 2023-06-27 MED ORDER — LEVOTHYROXINE SODIUM 50 MCG PO TABS
50.0000 ug | ORAL_TABLET | Freq: Every day | ORAL | Status: DC
  Administered 2023-06-28 – 2023-07-14 (×17): 50 ug via ORAL
  Filled 2023-06-27 (×17): qty 1

## 2023-06-27 MED ORDER — LIDOCAINE HCL (PF) 1 % IJ SOLN
INTRAMUSCULAR | Status: DC | PRN
  Administered 2023-06-27: 5 mL via INTRADERMAL
  Administered 2023-06-27: 2 mL via INTRADERMAL
  Administered 2023-06-27: 10 mL via INTRADERMAL

## 2023-06-27 MED ORDER — MIDAZOLAM HCL 2 MG/2ML IJ SOLN
INTRAMUSCULAR | Status: AC
Start: 2023-06-27 — End: ?
  Filled 2023-06-27: qty 2

## 2023-06-27 MED ORDER — IOHEXOL 350 MG/ML SOLN
100.0000 mL | Freq: Once | INTRAVENOUS | Status: AC | PRN
  Administered 2023-06-27: 100 mL via INTRAVENOUS

## 2023-06-27 MED ORDER — ACETAMINOPHEN 325 MG PO TABS: 650.0000 mg | ORAL_TABLET | ORAL | Status: DC | PRN

## 2023-06-27 MED ORDER — MIDAZOLAM HCL 2 MG/2ML IJ SOLN
INTRAMUSCULAR | Status: DC | PRN
  Administered 2023-06-27: 1 mg via INTRAVENOUS

## 2023-06-27 MED ORDER — FENTANYL CITRATE (PF) 100 MCG/2ML IJ SOLN
INTRAMUSCULAR | Status: AC
Start: 2023-06-27 — End: ?
  Filled 2023-06-27: qty 2

## 2023-06-27 MED ORDER — ONDANSETRON HCL 4 MG/2ML IJ SOLN
4.0000 mg | Freq: Four times a day (QID) | INTRAMUSCULAR | Status: DC | PRN
  Administered 2023-06-27 – 2023-07-06 (×10): 4 mg via INTRAVENOUS
  Filled 2023-06-27 (×10): qty 2

## 2023-06-27 MED ORDER — SODIUM CHLORIDE 0.9 % IV BOLUS
1000.0000 mL | Freq: Once | INTRAVENOUS | Status: AC
  Administered 2023-06-27: 1000 mL via INTRAVENOUS

## 2023-06-27 MED ORDER — VALACYCLOVIR HCL 1 G PO TABS: 1000.0000 mg | ORAL_TABLET | Freq: Three times a day (TID) | ORAL | 0 refills | Status: DC

## 2023-06-27 MED ORDER — IOHEXOL 350 MG/ML SOLN
INTRAVENOUS | Status: DC | PRN
  Administered 2023-06-27: 50 mL

## 2023-06-27 MED ORDER — HEPARIN (PORCINE) 25000 UT/250ML-% IV SOLN
1350.0000 [IU]/h | INTRAVENOUS | Status: DC
  Administered 2023-06-27: 500 [IU]/h via INTRAVENOUS
  Administered 2023-06-29: 1000 [IU]/h via INTRAVENOUS
  Filled 2023-06-27 (×3): qty 250

## 2023-06-27 SURGICAL SUPPLY — 21 items
CABLE ADAPT PACING TEMP 12FT (ADAPTER) IMPLANT
CATH 5FR JL3.5 JR4 ANG PIG MP (CATHETERS) IMPLANT
CATH IMPELLA RP FLEX (CATHETERS) IMPLANT
CATH INFINITI 5 FR 3DRC (CATHETERS) IMPLANT
CATH INFINITI 5FR AL1 (CATHETERS) IMPLANT
CATH SWAN GANZ VIP 7.5F (CATHETERS) IMPLANT
CATH WEDGE PA 7FR 110 (CATHETERS) IMPLANT
DEVICE RAD COMP TR BAND LRG (VASCULAR PRODUCTS) IMPLANT
ELECT DEFIB PAD ADLT CADENCE (PAD) IMPLANT
GLIDESHEATH SLEND SS 6F .021 (SHEATH) IMPLANT
GUIDEWIRE INQWIRE 1.5J.035X260 (WIRE) IMPLANT
KIT MICROPUNCTURE NIT STIFF (SHEATH) IMPLANT
PACK CARDIAC CATHETERIZATION (CUSTOM PROCEDURE TRAY) ×1 IMPLANT
SHEATH GLIDE SLENDER 4/5FR (SHEATH) IMPLANT
SHEATH PINNACLE 6F 10CM (SHEATH) IMPLANT
SHEATH PINNACLE 8F 10CM (SHEATH) IMPLANT
SHIELD CATHGARD ARROW (MISCELLANEOUS) IMPLANT
TRANSDUCER W/MONITORING KIT (MISCELLANEOUS) IMPLANT
TUBING ART PRESS 72 MALE/FEM (TUBING) IMPLANT
WIRE EMERALD 3MM-J .035X260CM (WIRE) IMPLANT
WIRE MICROINTRODUCER 60CM (WIRE) IMPLANT

## 2023-06-27 NOTE — Discharge Instructions (Addendum)
 Medication Changes: - START Torsemide  20mg  daily. - START Spironolactone  25mg  daily. - START Eliquis  5mg  twice daily. - START Cefazolin  (Ancef ). Inject 2g into vein every 8 hours through 07/20/2023. Then on 07/21/2023, START oral Linezolid  600mg  twice daily for 2 weeks. - START Protonix  40mg  daily. - DECREASE Aspirin  from 325mg  to 81mg  once daily. - CONTINUE Telmisartan 40mg  daily. - STOP Clonidine . - STOP Sertraline  (Zoloft ). - STOP Tumeric.  - Please continue all other home medications as listed elsewhere on discharge paperwork. _______________  Information on my medicine - ELIQUIS  (apixaban )  Why was Eliquis  prescribed for you? Eliquis  was prescribed for you to reduce the risk of forming blood clots that can cause a stroke if you have a medical condition called atrial fibrillation (a type of irregular heartbeat) OR to reduce the risk of a blood clots forming after orthopedic surgery.  What do You need to know about Eliquis  ? Take your Eliquis  TWICE DAILY - one tablet in the morning and one tablet in the evening with or without food.  It would be best to take the doses about the same time each day.  If you have difficulty swallowing the tablet whole please discuss with your pharmacist how to take the medication safely.  Take Eliquis  exactly as prescribed by your doctor and DO NOT stop taking Eliquis  without talking to the doctor who prescribed the medication.  Stopping may increase your risk of developing a new clot or stroke.  Refill your prescription before you run out.  After discharge, you should have regular check-up appointments with your healthcare provider that is prescribing your Eliquis .  In the future your dose may need to be changed if your kidney function or weight changes by a significant amount or as you get older.  What do you do if you miss a dose? If you miss a dose, take it as soon as you remember on the same day and resume taking twice daily.  Do not take more  than one dose of ELIQUIS  at the same time.  Important Safety Information A possible side effect of Eliquis  is bleeding. You should call your healthcare provider right away if you experience any of the following: Bleeding from an injury or your nose that does not stop. Unusual colored urine (red or dark brown) or unusual colored stools (red or black). Unusual bruising for unknown reasons. A serious fall or if you hit your head (even if there is no bleeding).  Some medicines may interact with Eliquis  and might increase your risk of bleeding or clotting while on Eliquis . To help avoid this, consult your healthcare provider or pharmacist prior to using any new prescription or non-prescription medications, including herbals, vitamins, non-steroidal anti-inflammatory drugs (NSAIDs) and supplements.  This website has more information on Eliquis  (apixaban ): http://[email or web address] .com/eliquis dena

## 2023-06-27 NOTE — Consult Note (Signed)
 NAME:  Vicki [name], MRN:  [id number], DOB:  [birth year], LOS: 0 ADMISSION DATE:  06/27/2023, CONSULTATION DATE:  06/27/23 REFERRING MD:  Julane Ny, CHIEF COMPLAINT:  acute RV failure   History of Present Illness:  Vicki [name] is a 73 y/o woman with a history of HTN, HLD, former tobacco abuse who presented with CP x 3 days that radiated to the jaw and back. She had associated nausea w/ vomiting, lightheadedness, weakness, and diaphoresis. She had these symptoms evaluated at [facility] on 6/8 but refused to go to the ED for further evaluation despite this recommendation. She was diagnosed with atrial fibrillation during that visit. Today she presented with worsening og SOB, dizziness, vomiting, CP. She took [place] powder x 2 at home prior to presentation. The past few days she has not been able to keep anything down and despite taking her BP meds, she has vomited them up. In the ED she was noted to be in Afib with HR 30-40s and hypotension. CTA did not demonstrate PE. Cardiology was consulted and she was evaluated with L&R heart catherization, found to have acute RV failure with low CO and no obvious acute coronary blockage, but her RCA was not able to be seen angiographically. She had an RP Impella placed for R-sided MCS. PCCM was consulted for ICU management.   Pertinent  Medical History  Stroke Seizures PTSD HTN HLD GERD FM  Significant Hospital Events: Including procedures, antibiotic start and stop dates in addition to other pertinent events   6/10 admitted, LHC, RHC, RP impella placed  Interim History / Subjective:    Objective    Blood pressure (!) 69/45, pulse (!) 46, temperature 97.7 F (36.5 C), temperature source Oral, resp. rate 14, height 5\' 3"  (1.6 m), weight 72.6 kg, last menstrual period 07/18/2011, SpO2 98%.        Intake/Output Summary (Last 24 hours) at 06/27/2023 1515 Last data filed at 06/27/2023 1149 Gross per 24 hour  Intake 800 ml  Output --  Net 800 ml   Filed  Weights   06/27/23 0958  Weight: 72.6 kg    Examination: General: chronically ill appearing woman lying in bed in NAD HENT: [place]/AT, eyes anicteric Lungs: breathing comfortably on RA, CTAB Cardiovascular: S1S2, RRR Abdomen: abd soft, NT Extremities: no cyanosis or edema Neuro: awake, alert, answering questions appropriately, moving all extremities    7.23/46/88/20 Trop 5310> 3412 BNP 543 Na+ 134 Bicarb 19, AG 16 BUN 31 Cr 1.49 AST 123 ALT 123 T bili 0.8 LA 5.3WBC 15.6 H/H 13.1/39.8 Platelets 153  EKG personally reviewed> bradycardic Afib, normal axis, delayed R wave progression across precordial leads.  EKG 6/8: Afib, QS waves in inferior leads with TWI, R waves in V1.  CT chest personally reviewed: mild nonspecific GGO throughout both lungs, no architectural distortion. No PE. Dilated IVC with dilated intrahepatic veins. Descending aortic plaque, small intramural hematoma. Slightly abnormal GB.   Resolved problem list   Assessment and Plan   Cardiogenic shock due to acute RV failure- although she has a small RCA previously and  Symptomatic bradycardia Atrial fibrillation with slow ventricular response -RP impella at P-6 w/ ~3 CO, bicarb purge -heparin   -serial coox -con't swan -hold PTA antihypertensives -statin- intolerant hold PTA repatha , con't daily aspirin  -tele monitoring -monitor electrolytes and replete as needed  Nausea and vomiting due to acute RV failure -clear liquids for now, ADAT -zofran  PRN  H/o insomnia -con't PTA zolpidem  PRN  Hyponatremia AKI -monitor -maintain adequate perfusion -strict I/O -  renally dose meds, avoid nephrotoxic meds  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 -monitor -treat RV failure  Lactic acidosis due to cardiogenic shock -trend  Remote h/o tobacco abuse -recommend lung cancer screening referral at discharge  Hypothyroidism -con't PTA synthroid  Best Practice (right click and "Reselect all SmartList  Selections" daily)   Diet/type: clear liquids DVT prophylaxis systemic heparin  Pressure ulcer(s): pressure ulcer assessment deferred -- see RN assessment GI prophylaxis: N/A Lines: Central line and Arterial Line Foley:  N/A Code Status:  full code Last date of multidisciplinary goals of care discussion [patient updated 6/10]  Labs   CBC: Recent Labs  Lab 06/27/23 1020 06/27/23 1034  WBC 15.6*  --   NEUTROABS 11.7*  --   HGB 13.1 13.6  13.3  HCT 39.8 40.0  39.0  MCV 97.3  --   PLT 153  --     Basic Metabolic Panel: Recent Labs  Lab 06/27/23 1020 06/27/23 1034  NA 134* 132*  133*  K 4.1 4.0  4.0  CL 99 100  CO2 19*  --   GLUCOSE 110* 109*  BUN 31* 32*  CREATININE 1.49* 1.40*  CALCIUM 8.6*  --    GFR: Estimated Creatinine Clearance: 34.7 mL/min (A) (by C-G formula based on SCr of 1.4 mg/dL (H)). Recent Labs  Lab 06/27/23 1020 06/27/23 1035  WBC 15.6*  --   LATICACIDVEN  --  5.3*    Liver Function Tests: Recent Labs  Lab 06/27/23 1020  AST 123*  ALT 123*  ALKPHOS 62  BILITOT 0.8  PROT 5.3*  ALBUMIN 3.0*   No results for input(s): "LIPASE", "AMYLASE" in the last 168 hours. No results for input(s): "AMMONIA" in the last 168 hours.  ABG    Component Value Date/Time   HCO3 19.5 (L) 06/27/2023 1034   TCO2 21 (L) 06/27/2023 1034   TCO2 19 (L) 06/27/2023 1034   ACIDBASEDEF 8.0 (H) 06/27/2023 1034   O2SAT 95 06/27/2023 1034     Coagulation Profile: No results for input(s): "INR", "PROTIME" in the last 168 hours.  Cardiac Enzymes: No results for input(s): "CKTOTAL", "CKMB", "CKMBINDEX", "TROPONINI" in the last 168 hours.  HbA1C: Hgb A1c MFr Bld  Date/Time Value Ref Range Status  12/27/2018 11:30 AM 5.3 4.8 - 5.6 % Final    Comment:    (NOTE) Pre diabetes:          5.7%-6.4% Diabetes:              >6.4% Glycemic control for   <7.0% adults with diabetes   05/25/2014 05:35 AM 5.2 4.8 - 5.6 % Final    Comment:    (NOTE)          Pre-diabetes: 5.7 - 6.4         Diabetes: >6.4         Glycemic control for adults with diabetes: <7.0     CBG: No results for input(s): "GLUCAP" in the last 168 hours.  Review of Systems:   Review of Systems  Constitutional:  Negative for chills and fever.  HENT: Negative.    Respiratory:  Positive for shortness of breath.   Cardiovascular:  Positive for chest pain. Negative for leg swelling.  Gastrointestinal:  Positive for nausea and vomiting.  Musculoskeletal:  Positive for neck pain.  Neurological:  Positive for dizziness.     Past Medical History:  She,  has a past medical history of Alcohol intoxication (HCC) (05/2014 ), Anxiety, Arthritis, Boils, Chronic back pain, Community  acquired pneumonia (01/05/2016), Depression, Fibromyalgia, GERD (gastroesophageal reflux disease), Heart attack [facility]), Hepatic cyst (01/06/2016), Hyperlipidemia, Hypertension, Insomnia, Leg cramps, Migraines, Mini stroke (05/2014), Osteopenia, PONV (postoperative nausea and vomiting), Postmenopausal bleeding (12/2015), PTSD (post-traumatic stress disorder), S/P epidural steroid injection, Seizures (HCC), Spinal stenosis, Squamous cell carcinoma (12/25/2015), Strep throat (01/05/2016), and Stroke (HCC).   Surgical History:   Past Surgical History:  Procedure Laterality Date   BREAST ENHANCEMENT SURGERY     CARDIAC CATHETERIZATION N/A 08/12/2015   Procedure: Left Heart Cath and Coronary Angiography;  Surgeon: Lucendia Rusk, MD;  Location: [facility] INVASIVE CV LAB;  Service: Cardiovascular;  Laterality: N/A;   COLONOSCOPY     DILATATION & CURETTAGE/HYSTEROSCOPY WITH MYOSURE N/A 02/22/2016   Procedure: DILATATION & CURETTAGE/HYSTEROSCOPY WITH MYOSURE;  Surgeon: Greta Leatherwood, MD;  Location: [facility] ORS;  Service: Gynecology;  Laterality: N/A;   FACIAL COSMETIC SURGERY     HAND SURGERY     nerve & tendon repair   LIPOSUCTION     LUMBAR EPIDURAL INJECTION  06/16/2015   SKIN BIOPSY Right 02/22/2016    Procedure: BIOPSY SKIN;  Surgeon: Greta Leatherwood, MD;  Location: [facility] ORS;  Service: Gynecology;  Laterality: Right;   SQUAMOUS CELL CARCINOMA EXCISION     WISDOM TOOTH EXTRACTION       Social History:   reports that she has quit smoking. She has never used smokeless tobacco. She reports that she does not drink alcohol and does not use drugs.   Family History:  Her family history includes CAD in her brother and brother; Cholesteatoma in her sister; Depression in her father; Heart attack in her brother, brother, maternal grandmother, maternal uncle, maternal uncle, maternal uncle, maternal uncle, and maternal uncle; Heart disease in her brother; Hyperlipidemia in her sister; Hypertension in her brother; Non-Hodgkin's lymphoma in her brother; Other in her brother; Stroke in her father and mother.   Allergies Allergies  Allergen Reactions   Codeine Other (See Comments)    headache   Crestor [Rosuvastatin] Other (See Comments)    Muscle aches   Other Nausea And Vomiting    Anesthesia-severe vomiting   Simvastatin Other (See Comments)    Muscle aches   Shellfish Allergy Other (See Comments)    Throat swelling   Statins      Home Medications  Prior to Admission medications   Medication Sig Start Date End Date Taking? Authorizing Provider  acetaminophen  (TYLENOL ) 500 MG tablet Take 800 mg by mouth every 6 (six) hours as needed.   Yes [provider]  ALPRAZolam  (XANAX ) 0.5 MG tablet TAKE 1/2 TO 1 TABLET BY MOUTH TWICE DAILY AS NEEDED 04/25/22  Yes Cottle, Kennedy Peabody., MD  aspirin  325 MG tablet Take 325 mg by mouth daily.   Yes [provider]  cloNIDine  (CATAPRES ) 0.1 MG tablet Take 1 tablet (0.1 mg total) by mouth daily. 10/17/22  Yes Cottle, Kennedy Peabody., MD  Evolocumab  (REPATHA  SURECLICK) 140 MG/ML SOAJ INJECT 1 DOSE INTO THE SKIN EVERY 14 DAYS 11/11/22  Yes Hilty, Aviva Lemmings, MD  levothyroxine (SYNTHROID) 50 MCG tablet Take 50 mcg by mouth daily. 01/02/23  Yes  [provider]  magnesium  oxide (MAG-OX) 400 MG tablet Take 400 mg by mouth daily.   Yes [provider]  telmisartan (MICARDIS) 40 MG tablet Take 40 mg by mouth daily.  09/11/17  Yes [provider]  VITAMIN D  PO Take by mouth.   Yes [provider]  zolpidem  (AMBIEN )  5 MG tablet TAKE 1 TABLET BY MOUTH AT BEDTIME AS NEEDED FOR SLEEP 10/18/22  Yes Cottle, Kennedy Peabody., MD  sertraline  (ZOLOFT ) 100 MG tablet Take 1 tablet (100 mg total) by mouth daily. Patient not taking: Reported on 06/27/2023 10/17/22   Cottle, Kennedy Peabody., MD     Critical care time: 45 min       Joesph Mussel, DO 06/27/23 4:06 PM [facility]  For contact information, see Amion. If no response to pager, please call PCCM consult pager. After hours, 7PM- 7AM, please call Elink.

## 2023-06-27 NOTE — ED Notes (Signed)
 Pt returned from CT

## 2023-06-27 NOTE — Consult Note (Incomplete)
 Hospital Consult    Reason for Consult:  aortic ulcer Requesting Physician:  Isaiah Marc MRN #:  [id number]  History of Present Illness: This is a 73 y.o. female who presented to the hospital with chest pain x 2 days. She has also had N/V and SOB.  Pain radiates to her jaw.  EMS was called and she was hypotensive and bradycardic.  CTA was obtained and she was found to have a mid and distal descending thoracic aorta with a small ulcerated plaque.  [facility] is consulted.   She was seen in [facility] urgent [facility] 2 days ago for chest pain and it was recommended she proceed to the ER.    She has hx of CVA, HTN, fibromyalgia, seizures, PTSD.  She did have a heart cath in 2017 that revealed non obstructive CAD.  She had a severe right radial artery spasm and recommended not using the radial approach if she ever needed an emergent heart cath.   The pt is on a statin for cholesterol management.  The pt is on a daily aspirin .   Other AC:  none The pt is on catapres , ARB for hypertension.   The pt is not on medication for diabetes PTA. Tobacco hx:  former  Past Medical History:  Diagnosis Date   Alcohol intoxication (HCC) 05/2014    ED note    Anxiety    Arthritis    Boils    Chronic back pain    Community acquired pneumonia 01/05/2016   Depression    Fibromyalgia    GERD (gastroesophageal reflux disease)    Heart attack (HCC)    Hepatic cyst 01/06/2016   Hyperlipidemia    Hypertension    Insomnia    Leg cramps    Migraines    with aura   Mini stroke 05/2014   with paralysis for 1 hr   Osteopenia    PONV (postoperative nausea and vomiting)    severe   Postmenopausal bleeding 12/2015   PTSD (post-traumatic stress disorder)    S/P epidural steroid injection    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, passed out, 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 followed up with eye doctor no further issues   Spinal stenosis    Squamous cell carcinoma 12/25/2015   right leg and nose   Strep throat 01/05/2016    Stroke (HCC)    3 strokes, no residual    Past Surgical History:  Procedure Laterality Date   BREAST ENHANCEMENT SURGERY     CARDIAC CATHETERIZATION N/A 08/12/2015   Procedure: Left Heart Cath and Coronary Angiography;  Surgeon: Lucendia Rusk, MD;  Location: [facility] INVASIVE CV LAB;  Service: Cardiovascular;  Laterality: N/A;   COLONOSCOPY     DILATATION & CURETTAGE/HYSTEROSCOPY WITH MYOSURE N/A 02/22/2016   Procedure: DILATATION & CURETTAGE/HYSTEROSCOPY WITH MYOSURE;  Surgeon: Greta Leatherwood, MD;  Location: [facility] ORS;  Service: Gynecology;  Laterality: N/A;   FACIAL COSMETIC SURGERY     HAND SURGERY     nerve & tendon repair   LIPOSUCTION     LUMBAR EPIDURAL INJECTION  06/16/2015   SKIN BIOPSY Right 02/22/2016   Procedure: BIOPSY SKIN;  Surgeon: Greta Leatherwood, MD;  Location: [facility] ORS;  Service: Gynecology;  Laterality: Right;   SQUAMOUS CELL CARCINOMA EXCISION     WISDOM TOOTH EXTRACTION      Allergies  Allergen Reactions   Codeine Other (See Comments)    headache   Crestor [Rosuvastatin] Other (  See Comments)    Muscle aches   Other Nausea And Vomiting    Anesthesia-severe vomiting   Simvastatin Other (See Comments)    Muscle aches   Shellfish Allergy Other (See Comments)    Throat swelling   Statins     Prior to Admission medications   Medication Sig Start Date End Date Taking? Authorizing Provider  acetaminophen  (TYLENOL ) 500 MG tablet Take 800 mg by mouth every 6 (six) hours as needed.    [provider]  albuterol  (VENTOLIN  HFA) 108 (90 Base) MCG/ACT inhaler Inhale 2 puffs into the lungs every 6 (six) hours as needed for up to 10 days for wheezing or shortness of breath. 01/19/23 01/29/23  Acevedo, Angela, PA  ALPRAZolam  (XANAX ) 0.5 MG tablet TAKE 1/2 TO 1 TABLET BY MOUTH TWICE DAILY AS NEEDED 04/25/22   Cottle, Carey G Jr., MD  Ascorbic Acid 125 MG CHEW Chew by mouth.    [provider]  aspirin  325 MG tablet Take 325 mg by mouth daily.     [provider]  benzonatate  (TESSALON ) 100 MG capsule Take 1 capsule (100 mg total) by mouth every 8 (eight) hours. 01/19/23   Acevedo, Angela, PA  Bioflavonoid Products (VITAMIN C) CHEW Chew by mouth.    [provider]  cloNIDine  (CATAPRES ) 0.1 MG tablet Take 1 tablet (0.1 mg total) by mouth daily. 10/17/22   Cottle, Kennedy Peabody., MD  clotrimazole-betamethasone (LOTRISONE) cream Apply topically 2 (two) times daily. 07/07/21   [provider]  Evolocumab  (REPATHA  SURECLICK) 140 MG/ML SOAJ INJECT 1 DOSE INTO THE SKIN EVERY 14 DAYS 11/11/22   Hilty, Aviva Lemmings, MD  ezetimibe  (ZETIA ) 10 MG tablet Take 1 tablet (10 mg total) by mouth daily. 10/03/22 01/24/23  Hazle Lites, MD  levothyroxine (SYNTHROID) 25 MCG tablet Take 37.5 mcg by mouth every morning. Patient not taking: Reported on 01/24/2023 10/05/21   [provider]  levothyroxine (SYNTHROID) 50 MCG tablet Take 50 mcg by mouth daily. 01/02/23   [provider]  magnesium  oxide (MAG-OX) 400 MG tablet Take 400 mg by mouth daily.    [provider]  metroNIDAZOLE  (METROCREAM ) 0.75 % cream APPLY EXTERNALLY TO AFFECTED AREA OF ACNE TWICE DAILY 11/07/20   [provider]  mupirocin ointment (BACTROBAN) 2 % Apply 1 Application topically 2 (two) times daily. 12/08/22   [provider]  predniSONE  (STERAPRED UNI-PAK 21 TAB) 10 MG (21) TBPK tablet Take as directed 01/19/23   Acevedo, Angela, PA  sertraline  (ZOLOFT ) 100 MG tablet Take 1 tablet (100 mg total) by mouth daily. 10/17/22   Cottle, Kennedy Peabody., MD  telmisartan (MICARDIS) 40 MG tablet Take 40 mg by mouth daily.  09/11/17   [provider]  VITAMIN D  PO Take by mouth.    [provider]  VITAMIN E PO Take by mouth.    [provider]  zolpidem  (AMBIEN ) 5 MG tablet TAKE 1 TABLET BY MOUTH AT BEDTIME AS NEEDED FOR SLEEP 10/18/22   Cottle, Kennedy Peabody., MD    Social History   Socioeconomic History   Marital  status: Divorced    Spouse name: Not on file   Number of children: Not on file   Years of education: Not on file   Highest education level: Not on file  Occupational History   Not on file  Tobacco Use   Smoking status: Former   Smokeless tobacco: Never  Vaping Use   Vaping status: Never Used  Substance  and Sexual Activity   Alcohol use: No   Drug use: No   Sexual activity: Yes    Partners: Male    Birth control/protection: Post-menopausal    Comment: occ  Other Topics Concern   Not on file  Social History Narrative   Not on file   Social Drivers of Health   Financial Resource Strain: Not on file  Food Insecurity: Not on file  Transportation Needs: Not on file  Physical Activity: Not on file  Stress: Not on file  Social Connections: Not on file  Intimate Partner Violence: Not on file     Family History  Problem Relation Age of Onset   Stroke Father    Depression Father    Stroke Mother    Hypertension Brother    Heart disease Brother        stint...PACER   Heart attack Brother    CAD Brother    Heart attack Maternal Uncle    Heart attack Maternal Grandmother    Heart attack Brother    Non-Hodgkin's lymphoma Brother    CAD Brother    Heart attack Maternal Uncle    Heart attack Maternal Uncle    Heart attack Maternal Uncle    Heart attack Maternal Uncle    Cholesteatoma Sister    Hyperlipidemia Sister    Other Brother        SUICIDE 05/2016    ROS: [x]  Positive   [ ]  Negative   [ ]  All sytems reviewed and are negative  Cardiac: [x]  chest pain/pressure [x]  hx MI [x]  SOB   Vascular: []  pain in legs while walking []  pain in legs at rest []  pain in legs at night []  non-healing ulcers []  hx of DVT []  swelling in legs  Pulmonary: []  asthma/wheezing []  home O2  Neurologic: [x]  hx of CVA  [x]  spinal stenosis  Hematologic: [x]  hx of cancer-SCC leg/nose  Endocrine:   []  diabetes []  thyroid  disease  GI [x]  GERD  GU: []  CKD/renal failure  []  HD--[]  M/W/F or []  T/T/S  Psychiatric: [x]  anxiety [x]  depression [x]  PTSD  Musculoskeletal: [x]  arthritis [x]  fibromyalgia  Integumentary: []  rashes []  ulcers  Constitutional: []  fever  []  chills  Physical Examination  Vitals:   06/27/23 1045 06/27/23 1100  BP: 91/71 (!) 89/71  Pulse: (!) 49 (!) 49  Resp: 20 15  Temp:    SpO2: 99% 96%   Body mass index is 28.34 kg/m.  General:  WDWN in NAD Gait: Not observed HENT: WNL, normocephalic Pulmonary: normal non-labored breathing Cardiac: {Desc; regular/irreg:14544}, {With/Without:20273} carotid bruit*** Abdomen: *** soft, NT; aortic pulse is *** palpable Skin: {With/Without:20273} rashes Vascular Exam/Pulses:  Right Left  Radial {Exam; arterial pulse strength 0-4:30167} {Exam; arterial pulse strength 0-4:30167}  Ulnar {Exam; arterial pulse strength 0-4:30167} {Exam; arterial pulse strength 0-4:30167}  Femoral {Exam; arterial pulse strength 0-4:30167} {Exam; arterial pulse strength 0-4:30167}  Popliteal {Exam; arterial pulse strength 0-4:30167} {Exam; arterial pulse strength 0-4:30167}  DP {Exam; arterial pulse strength 0-4:30167} {Exam; arterial pulse strength 0-4:30167}  PT {Exam; arterial pulse strength 0-4:30167} {Exam; arterial pulse strength 0-4:30167}   Extremities: *** Musculoskeletal: no muscle wasting or atrophy  Neurologic: A&O X 3 Psychiatric:  The pt has {Desc; normal/abnormal:11317::"Normal"} affect. ***  CBC    Component Value Date/Time   WBC 8.9 12/27/2018 1130   RBC 5.03 12/27/2018 1130   HGB 13.3 06/27/2023 1034   HGB 13.6 06/27/2023 1034   HGB 15.7 05/09/2012 1426   HCT 39.0 06/27/2023 1034  HCT 40.0 06/27/2023 1034   PLT 259 12/27/2018 1130   MCV 91.8 12/27/2018 1130   MCH 30.2 12/27/2018 1130   MCHC 32.9 12/27/2018 1130   RDW 12.9 12/27/2018 1130   LYMPHSABS 2.3 01/05/2016 2238   MONOABS 0.5 01/05/2016 2238   EOSABS 0.1 01/05/2016 2238   BASOSABS 0.1 01/05/2016 2238    BMET     Component Value Date/Time   NA 133 (L) 06/27/2023 1034   NA 132 (L) 06/27/2023 1034   K 4.0 06/27/2023 1034   K 4.0 06/27/2023 1034   CL 100 06/27/2023 1034   CO2 19 (L) 06/27/2023 1020   GLUCOSE 109 (H) 06/27/2023 1034   BUN 32 (H) 06/27/2023 1034   CREATININE 1.40 (H) 06/27/2023 1034   CREATININE 0.65 07/23/2015 1454   CALCIUM 8.6 (L) 06/27/2023 1020   GFRNONAA 37 (L) 06/27/2023 1020   GFRAA >60 12/27/2018 1130    COAGS: Lab Results  Component Value Date   INR 0.92 08/12/2015   INR 0.95 05/24/2014   INR 0.91 08/02/2010     Non-Invasive Vascular Imaging:   CTA c/a/p 06/27/2023 IMPRESSION: *No evidence of pulmonary embolism. *Irregular atheromatous plaque along the lateral wall of the mid and distal descending thoracic aorta with a small ulcerated plaque. These findings could correlate with a developing intramural hematoma measuring about 8 mm in maximum thickness by 14 mm in maximum AP diameter as measured on image number 70. (Intramural hematoma Stanford type B) *Subtle ground-glass appearance of both lungs with fibro atelectatic changes of the right upper lobe may correlate with chronic interstitial lung disease without evidence of acute infiltrates consolidations or pulmonary edema. *Gallbladder appears slightly distended mildly prominent with poor definition of the fundus. Consider ultrasound evaluation to exclude associated acute cholecystitis.    ASSESSMENT/PLAN: This is a 73 y.o. female ***   -*** -Dr. Vikki Graves to evaluate pt and determine further plan   Maryanna Smart, PA-C [facility] [phone]

## 2023-06-27 NOTE — Progress Notes (Addendum)
 ANTICOAGULATION CONSULT NOTE  Pharmacy Consult for heparin  Indication: Impella RP  Allergies  Allergen Reactions   Codeine Other (See Comments)    headache   Crestor [Rosuvastatin] Other (See Comments)    Muscle aches   Other Nausea And Vomiting    Anesthesia-severe vomiting   Simvastatin Other (See Comments)    Muscle aches   Shellfish Allergy Other (See Comments)    Throat swelling   Statins     Patient Measurements: Height: 5\' 3"  (160 cm) Weight: 72.6 kg (160 lb) IBW/kg (Calculated) : 52.4 Heparin  Dosing Weight: 68 kg   Vital Signs: Temp: 97.7 F (36.5 C) (06/10 1002) Temp Source: Oral (06/10 1002) BP: 69/45 (06/10 1200) Pulse Rate: 46 (06/10 1200)  Labs: Recent Labs    06/27/23 1020 06/27/23 1034 06/27/23 1200  HGB 13.1 13.6  13.3  --   HCT 39.8 40.0  39.0  --   PLT 153  --   --   CREATININE 1.49* 1.40*  --   TROPONINIHS 5,310*  --  3,412*    Estimated Creatinine Clearance: 34.7 mL/min (A) (by C-G formula based on SCr of 1.4 mg/dL (H)).  Medical History: Past Medical History:  Diagnosis Date   Alcohol intoxication (HCC) 05/2014    ED note    Anxiety    Arthritis    Boils    Chronic back pain    Community acquired pneumonia 01/05/2016   Depression    Fibromyalgia    GERD (gastroesophageal reflux disease)    Heart attack (HCC)    Hepatic cyst 01/06/2016   Hyperlipidemia    Hypertension    Insomnia    Leg cramps    Migraines    with aura   Mini stroke 05/2014   with paralysis for 1 hr   Osteopenia    PONV (postoperative nausea and vomiting)    severe   Postmenopausal bleeding 12/2015   PTSD (post-traumatic stress disorder)    S/P epidural steroid injection    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, passed out, 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  Not on anticoagulation prior to admission.  Pharmacy consulted for heparin  dosing.  -Chest CT w/ Type B dissection (small ulcerated plaque/developing intramural hematoma ~8 mm x 14 mm) -Received ~7500 units of heparin  in cath ~1430. -CBC stable - Hgb 13.1, Plt 153.  -TR band removed @1820 , ACT at the time 164 (goal > 180 off heparin ) - OK to restart prior to 2h after TR band off with low ACT per HF  Goal of Therapy:  Heparin  level 0.3-0.5 units/ml Monitor platelets by anticoagulation protocol: Yes   Plan:  Continue bicarb Impella purge START Heparin  infusion 500 units/hr @ 1900 Heparin  level q6h x24h, then q12h Daily CBC, heparin  levels  Monitor for s/sx of bleeding  Cecillia Cogan, PharmD Clinical Pharmacist 06/27/2023  3:29 PM

## 2023-06-27 NOTE — Progress Notes (Signed)
 Visited with pt. That was c/o chest pain,etc.  Chaplain Provided emotional and spiritual support.  Chaplain will follow as needed.   Anton Baton, [place], [facility], Pager [phone]

## 2023-06-27 NOTE — ED Triage Notes (Signed)
 Pt c/o chest pain, mid-sternal, radiating to back and jaw. This started a few days ago, was seen at [facility] 2 days ago. Pt was diagnosed with afib, declined to go to ED. Pt started feeling worse today. Pt is alert on arrival, c/o shortness of breath, vomiting, dizziness, and mid-sternal chest pain. Pt took goodys powder x 2 at home.   Cbg 118 HR 30-40s, afib 02 90% 89/48

## 2023-06-27 NOTE — Progress Notes (Signed)
Heart Failure Navigator Progress Note  Assessed for Heart & Vascular TOC clinic readiness.  Patient does not meet criteria due to Advanced Heart Failure Team Dr. Bensimhon consulted.   Navigator will sign off at this time.   Aleatha Taite, BSN, RN Heart Failure Nurse Navigator Secure Chat Only   

## 2023-06-27 NOTE — ED Provider Notes (Signed)
 [facility] CARDIAC CATH LAB Provider Note  CSN: [id number] Arrival date & time: 06/27/23 9562  Chief Complaint(s) Chest Pain  HPI Vicki [name] is a 73 y.o. female history of hypertension, hyperlipidemia, prior stroke, presenting to the emergency department with chest pain.  Patient reports chest pain for the past 2 days.  Initially went to urgent care, they advised her to come to the emergency department.  She felt like she would get better on her own since she went home.  Has continued to have chest pain, nausea and vomiting, shortness of breath.  Reports the pain radiates to the jaw on the back.  Reports the pain is severe.  Denies similar episode previously.  Called paramedics who found she was hypotensive, bradycardic.  They gave her some fluids.   Past Medical History Past Medical History:  Diagnosis Date   Alcohol intoxication (HCC) 05/2014    ED note    Anxiety    Arthritis    Boils    Chronic back pain    Community acquired pneumonia 01/05/2016   Depression    Fibromyalgia    GERD (gastroesophageal reflux disease)    Heart attack (HCC)    Hepatic cyst 01/06/2016   Hyperlipidemia    Hypertension    Insomnia    Leg cramps    Migraines    with aura   Mini stroke 05/2014   with paralysis for 1 hr   Osteopenia    PONV (postoperative nausea and vomiting)    severe   Postmenopausal bleeding 12/2015   PTSD (post-traumatic stress disorder)    S/P epidural steroid injection    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, passed out, 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 followed up with eye doctor no further issues   Spinal stenosis    Squamous cell carcinoma 12/25/2015   right leg and nose   Strep throat 01/05/2016   Stroke (HCC)    3 strokes, no residual   Patient Active Problem List   Diagnosis Date Noted   S/P lumbar fusion 12/31/2018   PTSD (post-traumatic stress disorder) 11/10/2017   Hypertriglyceridemia 10/07/2015   Abnormal nuclear stress test    Chest  pain 07/23/2015   Palpitations 07/23/2015   Depression    Left-sided weakness 05/24/2014   Hyponatremia 05/24/2014   Right arm numbness 05/24/2014   Alcohol abuse 05/24/2014   Alcohol abuse with intoxication (HCC) 05/24/2014   Syncope and collapse 05/24/2014   Cerebral infarction [facility])    Essential hypertension    Hyperlipidemia 04/04/2013   Home Medication(s) Prior to Admission medications   Medication Sig Start Date End Date Taking? Authorizing Provider  acetaminophen  (TYLENOL ) 500 MG tablet Take 800 mg by mouth every 6 (six) hours as needed.   Yes [provider]  ALPRAZolam  (XANAX ) 0.5 MG tablet TAKE 1/2 TO 1 TABLET BY MOUTH TWICE DAILY AS NEEDED 04/25/22  Yes Cottle, Kennedy Peabody., MD  aspirin  325 MG tablet Take 325 mg by mouth daily.   Yes [provider]  cloNIDine  (CATAPRES ) 0.1 MG tablet Take 1 tablet (0.1 mg total) by mouth daily. 10/17/22  Yes Cottle, Kennedy Peabody., MD  Evolocumab  (REPATHA  SURECLICK) 140 MG/ML SOAJ INJECT 1 DOSE INTO THE SKIN EVERY 14 DAYS 11/11/22  Yes Hilty, Aviva Lemmings, MD  levothyroxine (SYNTHROID) 50 MCG tablet Take 50 mcg by mouth daily. 01/02/23  Yes [provider]  magnesium  oxide (MAG-OX) 400 MG tablet Take 400 mg by mouth daily.  Yes [provider]  telmisartan (MICARDIS) 40 MG tablet Take 40 mg by mouth daily.  09/11/17  Yes [provider]  VITAMIN D  PO Take by mouth.   Yes [provider]  zolpidem  (AMBIEN ) 5 MG tablet TAKE 1 TABLET BY MOUTH AT BEDTIME AS NEEDED FOR SLEEP 10/18/22  Yes Cottle, Kennedy Peabody., MD  sertraline  (ZOLOFT ) 100 MG tablet Take 1 tablet (100 mg total) by mouth daily. Patient not taking: Reported on 06/27/2023 10/17/22   Cottle, Kennedy Peabody., MD                                                                                                                                    Past Surgical History Past Surgical History:  Procedure Laterality Date   BREAST ENHANCEMENT SURGERY      CARDIAC CATHETERIZATION N/A 08/12/2015   Procedure: Left Heart Cath and Coronary Angiography;  Surgeon: Lucendia Rusk, MD;  Location: [facility] INVASIVE CV LAB;  Service: Cardiovascular;  Laterality: N/A;   COLONOSCOPY     DILATATION & CURETTAGE/HYSTEROSCOPY WITH MYOSURE N/A 02/22/2016   Procedure: DILATATION & CURETTAGE/HYSTEROSCOPY WITH MYOSURE;  Surgeon: Greta Leatherwood, MD;  Location: [facility] ORS;  Service: Gynecology;  Laterality: N/A;   FACIAL COSMETIC SURGERY     HAND SURGERY     nerve & tendon repair   LIPOSUCTION     LUMBAR EPIDURAL INJECTION  06/16/2015   SKIN BIOPSY Right 02/22/2016   Procedure: BIOPSY SKIN;  Surgeon: Greta Leatherwood, MD;  Location: [facility] ORS;  Service: Gynecology;  Laterality: Right;   SQUAMOUS CELL CARCINOMA EXCISION     WISDOM TOOTH EXTRACTION     Family History Family History  Problem Relation Age of Onset   Stroke Father    Depression Father    Stroke Mother    Hypertension Brother    Heart disease Brother        stint...PACER   Heart attack Brother    CAD Brother    Heart attack Maternal Uncle    Heart attack Maternal Grandmother    Heart attack Brother    Non-Hodgkin's lymphoma Brother    CAD Brother    Heart attack Maternal Uncle    Heart attack Maternal Uncle    Heart attack Maternal Uncle    Heart attack Maternal Uncle    Cholesteatoma Sister    Hyperlipidemia Sister    Other Brother        SUICIDE 05/2016    Social History Social History   Tobacco Use   Smoking status: Former   Smokeless tobacco: Never  Advertising account planner   Vaping status: Never Used  Substance Use Topics   Alcohol use: No   Drug use: No   Allergies Codeine, Crestor [rosuvastatin], Other, Simvastatin, Shellfish allergy, and Statins  Review of Systems Review of Systems  All other systems reviewed and are negative.   Physical Exam  Vital Signs  I have reviewed the triage vital signs BP (!) 65/57   Pulse (!) 49   Temp 97.7 F (36.5 C) (Oral)   Resp (!)  21   Ht 5\' 3"  (1.6 m)   Wt 72.6 kg   LMP 07/18/2011   SpO2 98%   BMI 28.34 kg/m  Physical Exam Vitals and nursing note reviewed.  Constitutional:      General: She is not in acute distress.    Appearance: She is well-developed.  HENT:     Head: Normocephalic and atraumatic.     Mouth/Throat:     Mouth: Mucous membranes are moist.  Eyes:     Pupils: Pupils are equal, round, and reactive to light.  Cardiovascular:     Rate and Rhythm: Bradycardia present. Rhythm irregular.     Heart sounds: No murmur heard. Pulmonary:     Effort: Pulmonary effort is normal. No respiratory distress.     Breath sounds: Normal breath sounds.  Abdominal:     General: Abdomen is flat.     Palpations: Abdomen is soft.     Tenderness: There is no abdominal tenderness.  Musculoskeletal:        General: No tenderness.     Right lower leg: No edema.     Left lower leg: No edema.  Skin:    General: Skin is warm and dry.  Neurological:     General: No focal deficit present.     Mental Status: She is alert. Mental status is at baseline.  Psychiatric:        Mood and Affect: Mood normal.        Behavior: Behavior normal.     ED Results and Treatments Labs (all labs ordered are listed, but only abnormal results are displayed) Labs Reviewed  COMPREHENSIVE METABOLIC PANEL WITH GFR - Abnormal; Notable for the following components:      Result Value   Sodium 134 (*)    CO2 19 (*)    Glucose, Bld 110 (*)    BUN 31 (*)    Creatinine, Ser 1.49 (*)    Calcium 8.6 (*)    Total Protein 5.3 (*)    Albumin 3.0 (*)    AST 123 (*)    ALT 123 (*)    GFR, Estimated 37 (*)    Anion gap 16 (*)    All other components within normal limits  CBC WITH DIFFERENTIAL/PLATELET - Abnormal; Notable for the following components:   WBC 15.6 (*)    All other components within normal limits  I-STAT VENOUS BLOOD GAS, ED - Abnormal; Notable for the following components:   pH, Ven 7.230 (*)    pO2, Ven 88 (*)     Bicarbonate 19.5 (*)    TCO2 21 (*)    Acid-base deficit 8.0 (*)    Sodium 133 (*)    Calcium, Ion 1.12 (*)    All other components within normal limits  I-STAT CHEM 8, ED - Abnormal; Notable for the following components:   Sodium 132 (*)    BUN 32 (*)    Creatinine, Ser 1.40 (*)    Glucose, Bld 109 (*)    Calcium, Ion 1.04 (*)    TCO2 19 (*)    All other components within normal limits  I-STAT CG4 LACTIC ACID, ED - Abnormal; Notable for the following components:   Lactic Acid, Venous 5.3 (*)    All other components within normal limits  CULTURE, BLOOD (ROUTINE X  2)  CULTURE, BLOOD (ROUTINE X 2)  BRAIN NATRIURETIC PEPTIDE  I-STAT CG4 LACTIC ACID, ED  TROPONIN I (HIGH SENSITIVITY)  TROPONIN I (HIGH SENSITIVITY)                                                                                                                          Radiology CT Angio Chest/Abd/Pel for Dissection W and/or W/WO Result Date: 06/27/2023 CLINICAL DATA:  Acute aortic syndrome EXAM: CT ANGIOGRAPHY CHEST, ABDOMEN AND PELVIS TECHNIQUE: Non-contrast CT of the chest was initially obtained. Multidetector CT imaging through the chest, abdomen and pelvis was performed using the standard protocol during bolus administration of intravenous contrast. Multiplanar reconstructed images and MIPs were obtained and reviewed to evaluate the vascular anatomy. RADIATION DOSE REDUCTION: This exam was performed according to the departmental dose-optimization program which includes automated exposure control, adjustment of the mA and/or kV according to patient size and/or use of iterative reconstruction technique. CONTRAST:  OMNIPAQUE IOHEXOL 350 MG/ML SOLN COMPARISON:  CT abdomen and pelvis January 06, 2016 FINDINGS: CTA CHEST FINDINGS Cardiovascular: Satisfactory opacification of the pulmonary arteries to the segmental level. No evidence of pulmonary embolism. Normal heart size. No pericardial effusion. Subtle calcifications of  the aortic wall without coronary artery calcifications. Mediastinum/Nodes: No enlarged mediastinal, hilar, or axillary lymph nodes. Thyroid  gland, trachea, and esophagus demonstrate no significant findings. Lungs/Pleura: Subtle ground-glass appearance of both lungs with fibro atelectatic changes of the right upper lobe may correlate with chronic interstitial lung disease without evidence of acute infiltrates consolidations or pulmonary edema. No pulmonary nodules. No pneumonia or pleural effusions Musculoskeletal: No chest wall abnormality. No acute or significant osseous findings. Review of the MIP images confirms the above findings. CTA ABDOMEN AND PELVIS FINDINGS VASCULAR Aorta: Normal caliber aorta without aneurysm, dissection, vasculitis or significant stenosis. Irregular atheromatous plaque along the lateral wall of the aorta from the 1 to 5 o'clock distribution within the mid and distal descending thoracic aorta with a small ulcerated plaque on image number 66 these findings could correlate with a developing intramural hematoma measuring about 8 mm in maximum thickness by 14 mm in maximum AP diameter as measured on image number 70. Celiac: Patent without evidence of aneurysm, dissection, vasculitis or significant stenosis. SMA: Patent without evidence of aneurysm, dissection, vasculitis or significant stenosis. Renals: Both renal arteries are patent without evidence of aneurysm, dissection, vasculitis, fibromuscular dysplasia or significant stenosis. IMA: Patent without evidence of aneurysm, dissection, vasculitis or significant stenosis. Inflow: Patent without evidence of aneurysm, dissection, vasculitis or significant stenosis. Veins: No obvious venous abnormality within the limitations of this arterial phase study. Review of the MIP images confirms the above findings. NON-VASCULAR Hepatobiliary: Liver normal. Stable 10 mm hepatic cyst anterolateral margin of the right lobe of the liver compared with prior  examination. Gallbladder appears slightly distended mildly prominent with poor definition of the fundus. Consider ultrasound evaluation to rule out associated acute cholecystitis. Pancreas: Unremarkable. No pancreatic ductal dilatation or surrounding inflammatory changes.  Spleen: Normal in size without focal abnormality. Adrenals/Urinary Tract: Adrenal glands are unremarkable. Kidneys are normal, without renal calculi, focal lesion, or hydronephrosis. Bladder is unremarkable. Stomach/Bowel: Stomach is within normal limits. Appendix appears normal. No evidence of bowel wall thickening, distention, or inflammatory changes. Lymphatic: Unremarkable Reproductive: Uterus and bilateral adnexa are unremarkable. Other: No abdominal wall hernia or abnormality. No abdominopelvic ascites. Musculoskeletal: No fracture is seen. Post L4-5 transpedicular fixation and disc arthroplasty Review of the MIP images confirms the above findings. IMPRESSION: *No evidence of pulmonary embolism. *Irregular atheromatous plaque along the lateral wall of the mid and distal descending thoracic aorta with a small ulcerated plaque. These findings could correlate with a developing intramural hematoma measuring about 8 mm in maximum thickness by 14 mm in maximum AP diameter as measured on image number 70. (Intramural hematoma Stanford type B) *Subtle ground-glass appearance of both lungs with fibro atelectatic changes of the right upper lobe may correlate with chronic interstitial lung disease without evidence of acute infiltrates consolidations or pulmonary edema. *Gallbladder appears slightly distended mildly prominent with poor definition of the fundus. Consider ultrasound evaluation to exclude associated acute cholecystitis. Critical Value/emergent results were called by telephone at the time of interpretation on 06/27/2023 at 11:09 am to provider [facility] , who verbally acknowledged these results. Electronically Signed   By: Fredrich Jefferson  M.D.   On: 06/27/2023 11:10   DG Chest Portable 1 View Result Date: 06/27/2023 CLINICAL DATA:  Shortness of breath and midsternal chest pain radiating to the back and jaw for the past few days. Recently diagnosed with atrial fibrillation. EXAM: PORTABLE CHEST 1 VIEW COMPARISON:  12/31/2018 FINDINGS: The heart size and mediastinal contours are within normal limits. Both lungs are clear. The visualized skeletal structures are unremarkable. IMPRESSION: No active disease. Electronically Signed   By: Catherin Closs M.D.   On: 06/27/2023 10:42    Pertinent labs & imaging results that were available during my care of the patient were reviewed by me and considered in my medical decision making (see MDM for details).  Medications Ordered in ED Medications  HYDROmorphone  (DILAUDID ) injection 0.5 mg ( Intravenous MAR Hold 06/27/23 1211)  0.9 %  sodium chloride  infusion (has no administration in time range)  norepinephrine (LEVOPHED) 4mg  in (0.016 mg/mL) premix infusion ( Intravenous MAR Hold 06/27/23 1211)  sodium chloride  0.9 % bolus 1,000 mL (0 mLs Intravenous Stopped 06/27/23 1149)  HYDROmorphone  (DILAUDID ) injection 0.5 mg (0.5 mg Intravenous Given 06/27/23 1032)  iohexol (OMNIPAQUE) 350 MG/ML injection 100 mL (100 mLs Intravenous Contrast Given 06/27/23 1045)                                                                                                                                     Procedures Ultrasound ED Echo  Date/Time: 06/27/2023 11:38 AM  Performed by: Mordecai Applebaum, MD Authorized by: Mordecai Applebaum, MD   Procedure details:  Indications: chest pain and hypotension     Views: parasternal long axis view and IVC view     Images: archived   Findings:    Pericardium: no pericardial effusion     LV Function: normal (>50% EF)     IVC: dilated   Impression:    Impression: probable elevated CVP   .Critical Care  Performed by: Mordecai Applebaum, MD Authorized by:  Mordecai Applebaum, MD   Critical care provider statement:    Critical care time (minutes):  45   Critical care was necessary to treat or prevent imminent or life-threatening deterioration of the following conditions:  Circulatory failure   Critical care was time spent personally by me on the following activities:  Development of treatment plan with patient or surrogate, discussions with consultants, evaluation of patient's response to treatment, examination of patient, ordering and review of laboratory studies, ordering and review of radiographic studies, ordering and performing treatments and interventions, pulse oximetry, re-evaluation of patient's condition and review of old charts   Care discussed with: admitting provider     (including critical care time)  Medical Decision Making / ED Course   MDM:  73 year old presenting to the emergency department with chest pain.  Vital signs notable for hypotension, bradycardia.  Patient general looks well despite her abnormal vital signs.  Given severe chest pain, hypotension, consider ACS, cardiogenic shock, dissection.  Obtain CTA chest with no sign of dissection, pulmonary embolism.  She has been having chest pain for 2 days.  EKG without signs of STEMI but with bradycardia, possibly slow A-fib.  Symptoms are concerning for ACS.  CT does show some incidental gallbladder abnormality, possibly due to volume overload, no focal abdominal tenderness or pain will check quadrant ultrasound.  Have discussed with cardiology will see the patient.  Initially received some fluids but was started on norepinephrine, bedside ultrasound patient has plethoric IVC. CT also shows intramural aortic ?hematoma vs plaque. Discussed with Dr. Vikki Graves with vascular who will see patient but agrees aortic finding less likely to be cause of patient's symptoms  Clinical Course as of 06/27/23 1214  Tue Jun 27, 2023  1131 Discussed with cardiology [WS]  1201 Heart failure team  has come to see the patient.  They also think the patient is in cardiogenic shock.  The patient has been started on Levophed.  Still appears overall relatively well.  Plan will be for them to admit the patient and for her to go to the Cath Lab. [WS]    Clinical Course User Index [WS] Mordecai Applebaum, MD     Additional history obtained: -Additional history obtained from ems and spouse -External records from outside source obtained and reviewed including: Chart review including previous notes, labs, imaging, consultation notes including prior notes    Lab Tests: -I ordered, reviewed, and interpreted labs.   The pertinent results include:   Labs Reviewed  COMPREHENSIVE METABOLIC PANEL WITH GFR - Abnormal; Notable for the following components:      Result Value   Sodium 134 (*)    CO2 19 (*)    Glucose, Bld 110 (*)    BUN 31 (*)    Creatinine, Ser 1.49 (*)    Calcium 8.6 (*)    Total Protein 5.3 (*)    Albumin 3.0 (*)    AST 123 (*)    ALT 123 (*)    GFR, Estimated 37 (*)    Anion gap 16 (*)    All other components  within normal limits  CBC WITH DIFFERENTIAL/PLATELET - Abnormal; Notable for the following components:   WBC 15.6 (*)    All other components within normal limits  I-STAT VENOUS BLOOD GAS, ED - Abnormal; Notable for the following components:   pH, Ven 7.230 (*)    pO2, Ven 88 (*)    Bicarbonate 19.5 (*)    TCO2 21 (*)    Acid-base deficit 8.0 (*)    Sodium 133 (*)    Calcium, Ion 1.12 (*)    All other components within normal limits  I-STAT CHEM 8, ED - Abnormal; Notable for the following components:   Sodium 132 (*)    BUN 32 (*)    Creatinine, Ser 1.40 (*)    Glucose, Bld 109 (*)    Calcium, Ion 1.04 (*)    TCO2 19 (*)    All other components within normal limits  I-STAT CG4 LACTIC ACID, ED - Abnormal; Notable for the following components:   Lactic Acid, Venous 5.3 (*)    All other components within normal limits  CULTURE, BLOOD (ROUTINE X 2)   CULTURE, BLOOD (ROUTINE X 2)  BRAIN NATRIURETIC PEPTIDE  I-STAT CG4 LACTIC ACID, ED  TROPONIN I (HIGH SENSITIVITY)  TROPONIN I (HIGH SENSITIVITY)    Notable for leukocytosis, lactic acidosis, abnormal LFTs, AKI   EKG   EKG Interpretation Date/Time:  Tuesday June 27 2023 10:05:10 EDT Ventricular Rate:  44 PR Interval:    QRS Duration:  116 QT Interval:  559 QTC Calculation: 479 R Axis:   87  Text Interpretation: A-flutter/fibrillation w/ complete AV block Nonspecific intraventricular conduction delay Low voltage, precordial leads Nonspecific T abnrm, anterolateral leads Confirmed by Hiawatha Lout ([id number]) on 06/27/2023 10:06:34 AM         Imaging Studies ordered: I ordered imaging studies including CXR On my interpretation imaging demonstrates no acute process I independently visualized and interpreted imaging. I agree with the radiologist interpretation   Medicines ordered and prescription drug management: Meds ordered this encounter  Medications   sodium chloride  0.9 % bolus 1,000 mL   HYDROmorphone  (DILAUDID ) injection 0.5 mg   DISCONTD: valACYclovir (VALTREX) 1000 MG tablet    Sig: Take 1 tablet (1,000 mg total) by mouth 3 (three) times daily.    Dispense:  21 tablet    Refill:  0   DISCONTD: gabapentin (NEURONTIN) 300 MG capsule    Sig: Take 1 capsule (300 mg total) by mouth 3 (three) times daily as needed for up to 7 days (pain).    Dispense:  21 capsule    Refill:  0   iohexol (OMNIPAQUE) 350 MG/ML injection 100 mL   HYDROmorphone  (DILAUDID ) injection 0.5 mg   DISCONTD: norepinephrine (LEVOPHED) 4mg  in 250mL (0.016 mg/mL) premix infusion   0.9 %  sodium chloride  infusion   norepinephrine (LEVOPHED) 4mg  in 250mL (0.016 mg/mL) premix infusion    IV Access:   Peripheral    -I have reviewed the patients home medicines and have made adjustments as needed   Consultations Obtained: I requested consultation with the cardiologist,  and discussed lab and  imaging findings as well as pertinent plan - they recommend: admission and catheterization   Cardiac Monitoring: The patient was maintained on a cardiac monitor.  I personally viewed and interpreted the cardiac monitored which showed an underlying rhythm of: afib/bradycardia   Social Determinants of Health:  Diagnosis or treatment significantly limited by social determinants of health: current smoker   Reevaluation: After the interventions noted  above, I reevaluated the patient and found that their symptoms have improved  Co morbidities that complicate the patient evaluation  Past Medical History:  Diagnosis Date   Alcohol intoxication (HCC) 05/2014    ED note    Anxiety    Arthritis    Boils    Chronic back pain    Community acquired pneumonia 01/05/2016   Depression    Fibromyalgia    GERD (gastroesophageal reflux disease)    Heart attack (HCC)    Hepatic cyst 01/06/2016   Hyperlipidemia    Hypertension    Insomnia    Leg cramps    Migraines    with aura   Mini stroke 05/2014   with paralysis for 1 hr   Osteopenia    PONV (postoperative nausea and vomiting)    severe   Postmenopausal bleeding 12/2015   PTSD (post-traumatic stress disorder)    S/P epidural steroid injection    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    Final Clinical Impression(s) / ED Diagnoses Final diagnoses:  Cardiogenic shock (HCC)     This chart was dictated using voice recognition software.  Despite best efforts to proofread,  errors can occur which can change the documentation meaning.    Mordecai Applebaum, MD 06/27/23 1214

## 2023-06-27 NOTE — Progress Notes (Signed)
  Echocardiogram 2D Echocardiogram has been performed.  Vicki [name] 06/27/2023, 5:26 PM

## 2023-06-27 NOTE — Procedures (Signed)
 Arterial Catheter Insertion Procedure Note  [name]  [id number]  [birth year]  Date:06/27/23  Time:5:07 PM    Provider Performing: Rafael Bun    Procedure: Insertion of Arterial Line (53664) without US  guidance  Indication(s) Blood pressure monitoring and/or need for frequent ABGs  Consent Risks of the procedure as well as the alternatives and risks of each were explained to the patient and/or caregiver.  Consent for the procedure was obtained and is signed in the bedside chart  Anesthesia None   Time Out Verified patient identification, verified procedure, site/side was marked, verified correct patient position, special equipment/implants available, medications/allergies/relevant history reviewed, required imaging and test results available.   Sterile Technique Maximal sterile technique including full sterile barrier drape, hand hygiene, sterile gown, sterile gloves, mask, hair covering, sterile ultrasound probe cover (if used).   Procedure Description Area of catheter insertion was cleaned with chlorhexidine  and draped in sterile fashion. Without real-time ultrasound guidance an arterial catheter was placed into the left radial artery.  Appropriate arterial tracings confirmed on monitor.     Complications/Tolerance None; patient tolerated the procedure well.   EBL Minimal   Specimen(s) None   Rafael Bun, PA - [facility] For pager details, please see AMION or use Epic chat  After 1900, please call ELINK for cross coverage needs 06/27/2023, 5:07 PM

## 2023-06-27 NOTE — H&P (Addendum)
 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 Substernal w/ radiation to back, described as "tearing pain".  In cardiogenic shock, hypotensive w/ SBPs 60s and junctional bradycardia in the 40s w/ transient AFL. Hs trop 5300. EKG no STE. Lactic acid 5.3. SCr 1.4.   Emergent Chest CT c/w Type B dissection, distal descending thoracic aorta with a small ulcerated plaque/ developing intramural hematoma measuring about 8 mm in maximum thickness by 14 mm in maximum AP diameter. No evidence of PE.   She was started on NE in the ED. A&Ox3. C/w 8/10 chest pain. Mild dyspnea. No current respiratory distress.   Hgb 13.1   NA 134 K 4.1 CO2 19 Gluc 110  BUN 31 SCr 1.49 AST 123 ALT 123   LV ok on echo, RV severely HK   Emergent LHC w/ no significant CAD/culprit lesion.   RHC (on 15 mcg of NE) mRA 19 PA 35/21 PCW 19 CO 2.85 CI 1.62 by TD, FICK 1.28 PA sat 40%  PVR 0.89  PAPi 0.74   Home Medications Prior to Admission medications   Medication Sig Start Date End Date Taking? Authorizing Provider  acetaminophen  (TYLENOL ) 500 MG tablet Take 800 mg by mouth every 6 (six) hours as needed.   Yes [provider]  ALPRAZolam  (XANAX ) 0.5 MG tablet TAKE 1/2 TO 1 TABLET BY MOUTH TWICE DAILY AS NEEDED 04/25/22  Yes Cottle, Kennedy Peabody., MD  aspirin  325 MG tablet Take 325 mg by mouth daily.   Yes [provider]  cloNIDine  (CATAPRES ) 0.1 MG tablet Take 1 tablet (0.1 mg total) by mouth daily. 10/17/22  Yes Cottle, Kennedy Peabody., MD  Evolocumab  (REPATHA  SURECLICK) 140 MG/ML SOAJ INJECT 1 DOSE INTO THE SKIN EVERY 14 DAYS 11/11/22  Yes Hilty, Aviva Lemmings, MD  levothyroxine (SYNTHROID) 50  MCG tablet Take 50 mcg by mouth daily. 01/02/23  Yes [provider]  magnesium  oxide (MAG-OX) 400 MG tablet Take 400 mg by mouth daily.   Yes [provider]  telmisartan (MICARDIS) 40 MG tablet Take 40 mg by mouth daily.  09/11/17  Yes [provider]  VITAMIN D  PO Take by mouth.   Yes [provider]  zolpidem  (AMBIEN ) 5 MG tablet TAKE 1 TABLET BY MOUTH AT BEDTIME AS NEEDED FOR SLEEP 10/18/22  Yes Cottle, Kennedy Peabody., MD  sertraline  (ZOLOFT ) 100 MG tablet Take 1 tablet (100 mg total) by mouth daily. Patient not taking: Reported on 06/27/2023 10/17/22   Cottle, Kennedy Peabody., MD    Past Medical History: Past Medical History:  Diagnosis Date   Alcohol intoxication (HCC) 05/2014    ED note    Anxiety    Arthritis    Boils    Chronic back pain    Community acquired pneumonia 01/05/2016   Depression    Fibromyalgia    GERD (gastroesophageal reflux disease)    Heart attack (HCC)    Hepatic cyst 01/06/2016   Hyperlipidemia    Hypertension    Insomnia    Leg cramps    Migraines    with aura   Mini stroke 05/2014  with paralysis for 1 hr   Osteopenia    PONV (postoperative nausea and vomiting)    severe   Postmenopausal bleeding 12/2015   PTSD (post-traumatic stress disorder)    S/P epidural steroid injection    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, passed out, 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 followed up with eye doctor no further issues   Spinal stenosis    Squamous cell carcinoma 12/25/2015   right leg and nose   Strep throat 01/05/2016   Stroke (HCC)    3 strokes, no residual    Past Surgical History: Past Surgical History:  Procedure Laterality Date   BREAST ENHANCEMENT SURGERY     CARDIAC CATHETERIZATION N/A 08/12/2015   Procedure: Left Heart Cath and Coronary Angiography;  Surgeon: Lucendia Rusk, MD;  Location: [facility] INVASIVE CV LAB;  Service: Cardiovascular;  Laterality: N/A;   COLONOSCOPY     DILATATION & CURETTAGE/HYSTEROSCOPY WITH  MYOSURE N/A 02/22/2016   Procedure: DILATATION & CURETTAGE/HYSTEROSCOPY WITH MYOSURE;  Surgeon: Greta Leatherwood, MD;  Location: [facility];  Service: Gynecology;  Laterality: N/A;   FACIAL COSMETIC SURGERY     HAND SURGERY     nerve & tendon repair   LIPOSUCTION     LUMBAR EPIDURAL INJECTION  06/16/2015   SKIN BIOPSY Right 02/22/2016   Procedure: BIOPSY SKIN;  Surgeon: Greta Leatherwood, MD;  Location: [facility];  Service: Gynecology;  Laterality: Right;   SQUAMOUS CELL CARCINOMA EXCISION     WISDOM TOOTH EXTRACTION      Family History:  Family History  Problem Relation Age of Onset   Stroke Father    Depression Father    Stroke Mother    Hypertension Brother    Heart disease Brother        stint...PACER   Heart attack Brother    CAD Brother    Heart attack Maternal Uncle    Heart attack Maternal Grandmother    Heart attack Brother    Non-Hodgkin's lymphoma Brother    CAD Brother    Heart attack Maternal Uncle    Heart attack Maternal Uncle    Heart attack Maternal Uncle    Heart attack Maternal Uncle    Cholesteatoma Sister    Hyperlipidemia Sister    Other Brother        SUICIDE 05/2016    Social History: Social History   Socioeconomic History   Marital status: Divorced    Spouse name: Not on file   Number of children: Not on file   Years of education: Not on file   Highest education level: Not on file  Occupational History   Not on file  Tobacco Use   Smoking status: Former   Smokeless tobacco: Never  Vaping Use   Vaping status: Never Used  Substance and Sexual Activity   Alcohol use: No   Drug use: No   Sexual activity: Yes    Partners: Male    Birth control/protection: Post-menopausal    Comment: occ  Other Topics Concern   Not on file  Social History Narrative   Not on file   Social Drivers of Health   Financial Resource Strain: Not on file  Food Insecurity: Not on file  Transportation Needs: Not on file  Physical Activity: Not on file   Stress: Not on file  Social Connections: Not on file    Allergies:  Allergies  Allergen Reactions   Codeine Other (See Comments)  headache   Crestor [Rosuvastatin] Other (See Comments)    Muscle aches   Other Nausea And Vomiting    Anesthesia-severe vomiting   Simvastatin Other (See Comments)    Muscle aches   Shellfish Allergy Other (See Comments)    Throat swelling   Statins     Objective:    Vital Signs:   Temp:  [97.7 F (36.5 C)] 97.7 F (36.5 C) (06/10 1002) Pulse Rate:  [43-85] 49 (06/10 1130) Resp:  [13-21] 21 (06/10 1130) BP: (65-101)/(46-71) 65/57 (06/10 1130) SpO2:  [96 %-100 %] 98 % (06/10 1130) Weight:  [72.6 kg] 72.6 kg (06/10 0958)   Filed Weights   06/27/23 0958  Weight: 72.6 kg     Physical Exam     General:  uncomfortable appearing, no current respiratory difficulty  HEENT: Normal Neck: JVD distended, 16 cm  Cor: regular w/ slow rate  Lungs: decreased BS at the bases bilaterally  Abdomen: Soft, nontender, nondistended. Extremities: No cyanosis, clubbing, rash, edema Neuro: Alert & oriented x 3, cranial nerves grossly intact. moves all 4 extremities w/o difficulty.   Telemetry   Junctional bradycardia, 40s, personally reviewed   EKG   AFL w/ SVR 44 bpm. No STE. No Q waves   Labs     Basic Metabolic Panel: Recent Labs  Lab 06/27/23 1020 06/27/23 1034  NA 134* 132*  133*  K 4.1 4.0  4.0  CL 99 100  CO2 19*  --   GLUCOSE 110* 109*  BUN 31* 32*  CREATININE 1.49* 1.40*  CALCIUM 8.6*  --     Liver Function Tests: Recent Labs  Lab 06/27/23 1020  AST 123*  ALT 123*  ALKPHOS 62  BILITOT 0.8  PROT 5.3*  ALBUMIN 3.0*   No results for input(s): "LIPASE", "AMYLASE" in the last 168 hours. No results for input(s): "AMMONIA" in the last 168 hours.  CBC: Recent Labs  Lab 06/27/23 1020 06/27/23 1034  WBC 15.6*  --   NEUTROABS PENDING  --   HGB 13.1 13.6  13.3  HCT 39.8 40.0  39.0  MCV 97.3  --   PLT 153  --      Cardiac Enzymes: No results for input(s): "CKTOTAL", "CKMB", "CKMBINDEX", "TROPONINI" in the last 168 hours.  BNP: BNP (last 3 results) No results for input(s): "BNP" in the last 8760 hours.  ProBNP (last 3 results) No results for input(s): "PROBNP" in the last 8760 hours.   CBG: No results for input(s): "GLUCAP" in the last 168 hours.  Coagulation Studies: No results for input(s): "LABPROT", "INR" in the last 72 hours.  Imaging: CT Angio Chest/Abd/Pel for Dissection W and/or W/WO Result Date: 06/27/2023 CLINICAL DATA:  Acute aortic syndrome EXAM: CT ANGIOGRAPHY CHEST, ABDOMEN AND PELVIS TECHNIQUE: Non-contrast CT of the chest was initially obtained. Multidetector CT imaging through the chest, abdomen and pelvis was performed using the standard protocol during bolus administration of intravenous contrast. Multiplanar reconstructed images and MIPs were obtained and reviewed to evaluate the vascular anatomy. RADIATION DOSE REDUCTION: This exam was performed according to the departmental dose-optimization program which includes automated exposure control, adjustment of the mA and/or kV according to patient size and/or use of iterative reconstruction technique. CONTRAST:  OMNIPAQUE IOHEXOL 350 MG/ML SOLN COMPARISON:  CT abdomen and pelvis January 06, 2016 FINDINGS: CTA CHEST FINDINGS Cardiovascular: Satisfactory opacification of the pulmonary arteries to the segmental level. No evidence of pulmonary embolism. Normal heart size. No pericardial effusion. Subtle calcifications of the aortic wall  without coronary artery calcifications. Mediastinum/Nodes: No enlarged mediastinal, hilar, or axillary lymph nodes. Thyroid  gland, trachea, and esophagus demonstrate no significant findings. Lungs/Pleura: Subtle ground-glass appearance of both lungs with fibro atelectatic changes of the right upper lobe may correlate with chronic interstitial lung disease without evidence of acute infiltrates  consolidations or pulmonary edema. No pulmonary nodules. No pneumonia or pleural effusions Musculoskeletal: No chest wall abnormality. No acute or significant osseous findings. Review of the MIP images confirms the above findings. CTA ABDOMEN AND PELVIS FINDINGS VASCULAR Aorta: Normal caliber aorta without aneurysm, dissection, vasculitis or significant stenosis. Irregular atheromatous plaque along the lateral wall of the aorta from the 1 to 5 o'clock distribution within the mid and distal descending thoracic aorta with a small ulcerated plaque on image number 66 these findings could correlate with a developing intramural hematoma measuring about 8 mm in maximum thickness by 14 mm in maximum AP diameter as measured on image number 70. Celiac: Patent without evidence of aneurysm, dissection, vasculitis or significant stenosis. SMA: Patent without evidence of aneurysm, dissection, vasculitis or significant stenosis. Renals: Both renal arteries are patent without evidence of aneurysm, dissection, vasculitis, fibromuscular dysplasia or significant stenosis. IMA: Patent without evidence of aneurysm, dissection, vasculitis or significant stenosis. Inflow: Patent without evidence of aneurysm, dissection, vasculitis or significant stenosis. Veins: No obvious venous abnormality within the limitations of this arterial phase study. Review of the MIP images confirms the above findings. NON-VASCULAR Hepatobiliary: Liver normal. Stable 10 mm hepatic cyst anterolateral margin of the right lobe of the liver compared with prior examination. Gallbladder appears slightly distended mildly prominent with poor definition of the fundus. Consider ultrasound evaluation to rule out associated acute cholecystitis. Pancreas: Unremarkable. No pancreatic ductal dilatation or surrounding inflammatory changes. Spleen: Normal in size without focal abnormality. Adrenals/Urinary Tract: Adrenal glands are unremarkable. Kidneys are normal, without  renal calculi, focal lesion, or hydronephrosis. Bladder is unremarkable. Stomach/Bowel: Stomach is within normal limits. Appendix appears normal. No evidence of bowel wall thickening, distention, or inflammatory changes. Lymphatic: Unremarkable Reproductive: Uterus and bilateral adnexa are unremarkable. Other: No abdominal wall hernia or abnormality. No abdominopelvic ascites. Musculoskeletal: No fracture is seen. Post L4-5 transpedicular fixation and disc arthroplasty Review of the MIP images confirms the above findings. IMPRESSION: *No evidence of pulmonary embolism. *Irregular atheromatous plaque along the lateral wall of the mid and distal descending thoracic aorta with a small ulcerated plaque. These findings could correlate with a developing intramural hematoma measuring about 8 mm in maximum thickness by 14 mm in maximum AP diameter as measured on image number 70. (Intramural hematoma Stanford type B) *Subtle ground-glass appearance of both lungs with fibro atelectatic changes of the right upper lobe may correlate with chronic interstitial lung disease without evidence of acute infiltrates consolidations or pulmonary edema. *Gallbladder appears slightly distended mildly prominent with poor definition of the fundus. Consider ultrasound evaluation to exclude associated acute cholecystitis. Critical Value/emergent results were called by telephone at the time of interpretation on 06/27/2023 at 11:09 am to provider [facility] , who verbally acknowledged these results. Electronically Signed   By: Fredrich Jefferson M.D.   On: 06/27/2023 11:10   DG Chest Portable 1 View Result Date: 06/27/2023 CLINICAL DATA:  Shortness of breath and midsternal chest pain radiating to the back and jaw for the past few days. Recently diagnosed with atrial fibrillation. EXAM: PORTABLE CHEST 1 VIEW COMPARISON:  12/31/2018 FINDINGS: The heart size and mediastinal contours are within normal limits. Both lungs are clear. The visualized  skeletal  structures are unremarkable. IMPRESSION: No active disease. Electronically Signed   By: Catherin Closs M.D.   On: 06/27/2023 10:42     Patient Profile   73 y/o female w/ h/o normal cors on cardiac cath in 2017 and h/o HLD, presenting to the ED w/ 3 day h/o severe chest pain. Found to be in cardiogenic shock w/ hypotension and bradycardia. Chest CT w/ Type B Aortic Dissection.   Assessment/Plan   1. Cardiogenic Shock, 2/2 Profound RV Failure   - Initial SBPs 60s, placed on NE titrated up to 15. Lactic acid 5.3. EKG no STE. HS trop 5300. Emergent LHC w/ no culprit lesions to explain CP and trop elevation  - RHC concerning for restrictive physiology w/ equalization of R+ L sided pressures. Initial concern for hemopericardium but this was ruled out by stat echo. No pericardial effusion. LV ok. RV severely HK. RHC mRA 19, PAP 35/21, PCW 19, CO 2.85, CI 1.62 by TD, FICK 1.28, PVR 0.89, PAPi 0.74. Presentation c/w severe RV dominant CS  - plan RP impella support  - continue NE. Wean as able once MCS initiated  - follow LA for clearance - follow swan hemodynamics   2. Aortic ulceration - distal descending thoracic aorta with a small ulcerated plaque measuring about 8 mm in maximum thickness by 14 mm in maximum AP diameter - seen by VVS 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  - hypotensive per above. Hgb 13. - close BP monitoring. Needs a-line placement   3. Bradycardia/AFL  - junctional brady in the ED, HR 40s. EKG w/ slow AFL  - not current candidate for anticoagulation given aortic intramural hematoma  - watch for TVP needs   4. Shock Liver - AST 123 - ALT 123 - CS management per above  - follow HFTs   5. AKI  - Initial SCr 1.4, prior b/l < 1.0. High risk for ATN  - follow CMP and UOP - continue NE support    Ruddy Corral, PA-C 06/27/2023, 12:05 PM Total Time Spent 20 min Advanced Heart Failure Team Pager [phone] (M-F; 7a - 5p)  Please contact [facility] for night-coverage after  hours (4p -7a ) and weekends on [email or web address]  Agree with above  38 y/o woman with h/o HTN, HL, depression, spinal stenosis and normal coronaries on cath 2017.  Presents with 3 day h/o severe chest, shoulder and upper back pain. On arrival to ED hypotensive with junctional rhythm and lactic acid > 5. CT scan no PE. Ulcerate plaque in descending aorta  We met her in ER and transported emergently to cath lab.   I dis POCUS echo LVEF normal . No effusion RV HK   Cath with normal left dominant system. Unable to locate very tiny non-dominant RCA seen previously in 2017.  RHC consistent with profound shock due to RV failure. Impella RP placed  General:  Ill appearing. Uncomfortable HEENT: normal Neck: JVP elevated Cor: Regular brady Lungs: coarse Abdomen: obese soft, nontender, nondistended Extremities: no cyanosis, clubbing, rash, tr edema coold Neuro: alert & orientedx3, cranial nerves grossly intact. moves all 4 extremities w/o difficulty. Affect pleasant  She has profound cardiogenic shock due to RV failure. Etiology of RV failure remains a bit of puzzle given no PE and essentailly normal coronaries. Only explanation has to be loss of very tiny non-dominant RCA with RV infarct.   Now with Impella RP in place,   Aortic CTs reviewed with TCTS and VVS and changes 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.   RP  impella with good waveforms flowing 3L   CRITICAL CARE Performed by: Jules Oar  Total critical care time: 60 minutes  Critical care time was exclusive of separately billable procedures and treating other patients.  Critical care was necessary to treat or prevent imminent or life-threatening deterioration.  Critical care was time spent personally by me (independent of midlevel providers or residents) on the following activities: development of treatment plan with patient and/or surrogate as well as nursing, discussions with consultants, evaluation of patient's response to treatment,  examination of patient, obtaining history from patient or surrogate, ordering and performing treatments and interventions, ordering and review of laboratory studies, ordering and review of radiographic studies, pulse oximetry and re-evaluation of patient's condition.  Jules Oar, MD  11:20 PM

## 2023-06-28 ENCOUNTER — Inpatient Hospital Stay (HOSPITAL_COMMUNITY)

## 2023-06-28 ENCOUNTER — Encounter (HOSPITAL_COMMUNITY): Payer: Self-pay | Admitting: Internal Medicine

## 2023-06-28 DIAGNOSIS — E8729 Other acidosis: Secondary | ICD-10-CM | POA: Diagnosis not present

## 2023-06-28 DIAGNOSIS — E871 Hypo-osmolality and hyponatremia: Secondary | ICD-10-CM | POA: Diagnosis not present

## 2023-06-28 DIAGNOSIS — R570 Cardiogenic shock: Secondary | ICD-10-CM | POA: Diagnosis not present

## 2023-06-28 DIAGNOSIS — I50811 Acute right heart failure: Secondary | ICD-10-CM | POA: Diagnosis not present

## 2023-06-28 LAB — COOXEMETRY PANEL
Carboxyhemoglobin: 1.6 % — ABNORMAL HIGH (ref 0.5–1.5)
Carboxyhemoglobin: 2.3 % — ABNORMAL HIGH (ref 0.5–1.5)
Carboxyhemoglobin: 1.5 % (ref 0.5–1.5)
Carboxyhemoglobin: 1.8 % — ABNORMAL HIGH (ref 0.5–1.5)
Carboxyhemoglobin: 1.8 % — ABNORMAL HIGH (ref 0.5–1.5)
Methemoglobin: <0.7 % (ref 0.0–1.5)
Methemoglobin: <0.7 % (ref 0.0–1.5)
Methemoglobin: <0.7 % (ref 0.0–1.5)
Methemoglobin: <0.7 % (ref 0.0–1.5)
Methemoglobin: <0.7 % (ref 0.0–1.5)
O2 Saturation: 53.9 %
O2 Saturation: 80.3 %
O2 Saturation: 49.9 %
O2 Saturation: 57.8 %
O2 Saturation: 58.2 %
Total hemoglobin: 12.1 g/dL (ref 12.0–16.0)
Total hemoglobin: 11.8 g/dL — ABNORMAL LOW (ref 12.0–16.0)
Total hemoglobin: 11.1 g/dL — ABNORMAL LOW (ref 12.0–16.0)
Total hemoglobin: 11.5 g/dL — ABNORMAL LOW (ref 12.0–16.0)
Total hemoglobin: 11.1 g/dL — ABNORMAL LOW (ref 12.0–16.0)

## 2023-06-28 LAB — CBC
HCT: 34.5 % — ABNORMAL LOW (ref 36.0–46.0)
Hemoglobin: 11.7 g/dL — ABNORMAL LOW (ref 12.0–15.0)
MCH: 31.9 pg (ref 26.0–34.0)
MCHC: 33.9 g/dL (ref 30.0–36.0)
MCV: 94 fL (ref 80.0–100.0)
Platelets: 118 10*3/uL — ABNORMAL LOW (ref 150–400)
RBC: 3.67 MIL/uL — ABNORMAL LOW (ref 3.87–5.11)
RDW: 13.3 % (ref 11.5–15.5)
WBC: 15.1 10*3/uL — ABNORMAL HIGH (ref 4.0–10.5)
nRBC: 0 % (ref 0.0–0.2)

## 2023-06-28 LAB — POCT I-STAT 7, (LYTES, BLD GAS, ICA,H+H)
Acid-base deficit: 3 mmol/L — ABNORMAL HIGH (ref 0.0–2.0)
Acid-base deficit: 3 mmol/L — ABNORMAL HIGH (ref 0.0–2.0)
Bicarbonate: 21.7 mmol/L (ref 20.0–28.0)
Bicarbonate: 20.9 mmol/L (ref 20.0–28.0)
Calcium, Ion: 1.17 mmol/L (ref 1.15–1.40)
Calcium, Ion: 1.17 mmol/L (ref 1.15–1.40)
HCT: 34 % — ABNORMAL LOW (ref 36.0–46.0)
HCT: 33 % — ABNORMAL LOW (ref 36.0–46.0)
Hemoglobin: 11.6 g/dL — ABNORMAL LOW (ref 12.0–15.0)
Hemoglobin: 11.2 g/dL — ABNORMAL LOW (ref 12.0–15.0)
O2 Saturation: 95 %
O2 Saturation: 94 %
Patient temperature: 37.1
Patient temperature: 37.3
Potassium: 4.4 mmol/L (ref 3.5–5.1)
Potassium: 4.3 mmol/L (ref 3.5–5.1)
Sodium: 129 mmol/L — ABNORMAL LOW (ref 135–145)
Sodium: 127 mmol/L — ABNORMAL LOW (ref 135–145)
TCO2: 23 mmol/L (ref 22–32)
TCO2: 22 mmol/L (ref 22–32)
pCO2 arterial: 35.9 mmHg (ref 32–48)
pCO2 arterial: 33.1 mmHg (ref 32–48)
pH, Arterial: 7.39 (ref 7.35–7.45)
pH, Arterial: 7.409 (ref 7.35–7.45)
pO2, Arterial: 74 mmHg — ABNORMAL LOW (ref 83–108)
pO2, Arterial: 70 mmHg — ABNORMAL LOW (ref 83–108)

## 2023-06-28 LAB — BASIC METABOLIC PANEL WITH GFR
Anion gap: 8 (ref 5–15)
BUN: 26 mg/dL — ABNORMAL HIGH (ref 8–23)
CO2: 21 mmol/L — ABNORMAL LOW (ref 22–32)
Calcium: 7.8 mg/dL — ABNORMAL LOW (ref 8.9–10.3)
Chloride: 97 mmol/L — ABNORMAL LOW (ref 98–111)
Creatinine, Ser: 1.06 mg/dL — ABNORMAL HIGH (ref 0.44–1.00)
GFR, Estimated: 56 mL/min — ABNORMAL LOW (ref >60)
Glucose, Bld: 135 mg/dL — ABNORMAL HIGH (ref 70–99)
Potassium: 4.3 mmol/L (ref 3.5–5.1)
Sodium: 126 mmol/L — ABNORMAL LOW (ref 135–145)

## 2023-06-28 LAB — LACTATE DEHYDROGENASE: LDH: 498 U/L — ABNORMAL HIGH (ref 98–192)

## 2023-06-28 LAB — HEPARIN LEVEL (UNFRACTIONATED)
Heparin Unfractionated: <0.1 [IU]/mL — ABNORMAL LOW (ref 0.30–0.70)
Heparin Unfractionated: <0.1 [IU]/mL — ABNORMAL LOW (ref 0.30–0.70)
Heparin Unfractionated: <0.1 [IU]/mL — ABNORMAL LOW (ref 0.30–0.70)

## 2023-06-28 LAB — TROPONIN I (HIGH SENSITIVITY)
Troponin I (High Sensitivity): 4251 ng/L (ref <18)
Troponin I (High Sensitivity): 3754 ng/L (ref <18)

## 2023-06-28 LAB — CG4 I-STAT (LACTIC ACID): Lactic Acid, Venous: 0.9 mmol/L (ref 0.5–1.9)

## 2023-06-28 MED ORDER — BISACODYL 10 MG RE SUPP
10.0000 mg | Freq: Once | RECTAL | Status: AC
  Administered 2023-06-28: 10 mg via RECTAL
  Filled 2023-06-28: qty 1

## 2023-06-28 MED ORDER — CHLORHEXIDINE GLUCONATE CLOTH 2 % EX PADS
6.0000 | MEDICATED_PAD | Freq: Every day | CUTANEOUS | Status: DC
  Administered 2023-06-28 – 2023-07-14 (×17): 6 via TOPICAL

## 2023-06-28 MED ORDER — LIDOCAINE 5 % EX PTCH
1.0000 | MEDICATED_PATCH | CUTANEOUS | Status: DC
  Administered 2023-06-28 – 2023-07-14 (×14): 1 via TRANSDERMAL
  Filled 2023-06-28 (×14): qty 1

## 2023-06-28 MED ORDER — NOREPINEPHRINE 16 MG/250ML-% IV SOLN
0.0000 ug/min | INTRAVENOUS | Status: DC
  Administered 2023-06-28: 19 ug/min via INTRAVENOUS
  Administered 2023-06-29: 4 ug/min via INTRAVENOUS
  Administered 2023-07-02: 3 ug/min via INTRAVENOUS
  Filled 2023-06-28 (×4): qty 250

## 2023-06-28 MED ORDER — PROMETHAZINE (PHENERGAN) 6.25MG IN NS 50ML IVPB
6.2500 mg | Freq: Four times a day (QID) | INTRAVENOUS | Status: DC | PRN
  Administered 2023-06-28 – 2023-07-01 (×5): 6.25 mg via INTRAVENOUS
  Filled 2023-06-28: qty 8.33
  Filled 2023-06-28 (×3): qty 6.25
  Filled 2023-06-28: qty 8.33

## 2023-06-28 MED ORDER — ACETAMINOPHEN 325 MG PO TABS
650.0000 mg | ORAL_TABLET | Freq: Three times a day (TID) | ORAL | Status: DC
  Administered 2023-06-28 – 2023-06-30 (×6): 650 mg via ORAL
  Filled 2023-06-28 (×6): qty 2

## 2023-06-28 MED ORDER — DOBUTAMINE-DEXTROSE 4-5 MG/ML-% IV SOLN
5.0000 ug/kg/min | INTRAVENOUS | Status: DC
  Administered 2023-06-28: 2.5 ug/kg/min via INTRAVENOUS
  Administered 2023-06-30 – 2023-07-02 (×2): 5 ug/kg/min via INTRAVENOUS
  Administered 2023-07-03: 7.5 ug/kg/min via INTRAVENOUS
  Filled 2023-06-28 (×4): qty 250

## 2023-06-28 MED ORDER — SODIUM CHLORIDE 0.9 % IV SOLN: 250.0000 mL | INTRAVENOUS | Status: AC | PRN

## 2023-06-28 MED ORDER — POLYETHYLENE GLYCOL 3350 17 G PO PACK
17.0000 g | PACK | Freq: Every day | ORAL | Status: DC
  Administered 2023-06-28: 17 g via ORAL
  Filled 2023-06-28 (×2): qty 1

## 2023-06-28 MED ORDER — MAGNESIUM SULFATE 2 GM/50ML IV SOLN
2.0000 g | Freq: Once | INTRAVENOUS | Status: AC
  Administered 2023-06-28: 2 g via INTRAVENOUS
  Filled 2023-06-28: qty 50

## 2023-06-28 MED ORDER — HYDROMORPHONE HCL 1 MG/ML IJ SOLN
0.5000 mg | INTRAMUSCULAR | Status: DC | PRN
  Administered 2023-06-28 – 2023-07-02 (×8): 0.5 mg via INTRAVENOUS
  Filled 2023-06-28 (×8): qty 0.5

## 2023-06-28 NOTE — TOC Initial Note (Signed)
 Transition of Care [facility]) - Initial/Assessment Note    Patient Details  Name: Vicki [name] MRN: [id number] Date of Birth: Jan 29, 1950  [facility] CM/SW Contact:    Ernst Heap Phone Number: [phone] 06/28/2023, 10:46 AM  Clinical Narrative: HF CSW met with patient and husband at bedside. Patient and husband stated just them two live in the home. Patient stated that she has no history of HH services. Patient stated that she does not use any equipment at home. Patient stated that they have a scale at home. Patient stated that she has a PCP. CSW explained that a hospital follow up will be scheduled closer towards dc. Patient agrees.   TOC will continue following.                   Expected Discharge Plan: Home/Self Care Barriers to Discharge: Continued Medical Work up   Patient Goals and CMS Choice            Expected Discharge Plan and Services       Living arrangements for the past 2 months: Single Family Home                                      Prior Living Arrangements/Services Living arrangements for the past 2 months: Single Family Home Lives with:: Spouse Patient language and need for interpreter reviewed:: Yes Do you feel safe going back to the place where you live?: Yes      Need for Family Participation in Patient Care: No (Comment) Care giver support system in place?: Yes (comment)   Criminal Activity/Legal Involvement Pertinent to Current Situation/Hospitalization: No - Comment as needed  Activities of Daily Living   ADL Screening (condition at time of admission) Independently performs ADLs?: Yes (appropriate for developmental age) Is the patient deaf or have difficulty hearing?: No Does the patient have difficulty seeing, even when wearing glasses/contacts?: No Does the patient have difficulty concentrating, remembering, or making decisions?: No  Permission Sought/Granted                  Emotional  Assessment Appearance:: Appears stated age Attitude/Demeanor/Rapport: Engaged Affect (typically observed): Appropriate Orientation: : Oriented to Self, Oriented to Place, Oriented to  Time, Oriented to Situation Alcohol / Substance Use: Not Applicable Psych Involvement: No (comment)  Admission diagnosis:  Cardiogenic shock (HCC) [R57.0] Patient Active Problem List   Diagnosis Date Noted   Cardiogenic shock (HCC) 06/27/2023   S/P lumbar fusion 12/31/2018   PTSD (post-traumatic stress disorder) 11/10/2017   Hypertriglyceridemia 10/07/2015   Abnormal nuclear stress test    Chest pain 07/23/2015   Palpitations 07/23/2015   Depression    Left-sided weakness 05/24/2014   Hyponatremia 05/24/2014   Right arm numbness 05/24/2014   Alcohol abuse 05/24/2014   Alcohol abuse with intoxication (HCC) 05/24/2014   Syncope and collapse 05/24/2014   Cerebral infarction [facility])    Essential hypertension    Hyperlipidemia 04/04/2013   PCP:  Faustina Hood, MD Pharmacy:   [facility] [address] [place] - [address] [facility] [place] [address] Phone: [phone] Fax: [phone]     Social Drivers of Health (SDOH) Social History: SDOH Screenings   Food Insecurity: No Food Insecurity (06/28/2023)  Housing: Low Risk  (06/28/2023)  Transportation Needs: No Transportation Needs (06/28/2023)  Utilities: Not At Risk (06/28/2023)  Social Connections: Socially Integrated (  06/28/2023)  Tobacco Use: Medium Risk (06/27/2023)   SDOH Interventions:     Readmission Risk Interventions     No data to display

## 2023-06-28 NOTE — Progress Notes (Addendum)
 ANTICOAGULATION CONSULT NOTE  Pharmacy Consult for heparin  Indication: Impella RP  Allergies  Allergen Reactions   Codeine Other (See Comments)    headache   Crestor [Rosuvastatin] Other (See Comments)    Muscle aches   Other Nausea And Vomiting    Anesthesia-severe vomiting   Simvastatin Other (See Comments)    Muscle aches   Egg White (Egg Protein)     Other Reaction(s): Throat closes   Ezetimibe      Other Reaction(s): crippling muscle aches   Hydrocodone-Acetaminophen      Other Reaction(s): migraines   Peanut (Diagnostic)     Other Reaction(s): itchy   Shellfish Allergy Other (See Comments)    Throat swelling   Statins     Patient Measurements: Height: 5' 3 (160 cm) Weight: 75.2 kg (165 lb 12.6 oz) IBW/kg (Calculated) : 52.4 Heparin  Dosing Weight: 68 kg   Vital Signs: Temp: 99.1 F (37.3 C) (06/11 0809) Temp Source: Core (06/11 0800) Pulse Rate: 42 (06/11 0809)  Labs: Recent Labs    06/27/23 1020 06/27/23 1034 06/27/23 1200 06/27/23 1231 06/27/23 1629 06/27/23 1700 06/27/23 2232 06/28/23 0101 06/28/23 0444 06/28/23 0455 06/28/23 0730 06/28/23 0800 06/28/23 0808  HGB 13.1 13.6  13.3  --    < > 13.3   < >  --   --  11.6* 11.7*  --   --  11.2*  HCT 39.8 40.0  39.0  --    < > 38.4   < >  --   --  34.0* 34.5*  --   --  33.0*  PLT 153  --   --   --  168  --   --   --   --  118*  --   --   --   HEPARINUNFRC  --   --   --   --   --   --   --  <0.10*  --   --   --  <0.10*  --   CREATININE 1.49* 1.40*  --   --   --   --  1.18*  --   --  1.06*  --   --   --   TROPONINIHS 5,310*  --  3,412*  --  6,049*  --   --   --   --   --  4,251*  --   --    < > = values in this interval not displayed.    Estimated Creatinine Clearance: 46.6 mL/min (A) (by C-G formula based on SCr of 1.06 mg/dL (H)).  Medical History: Past Medical History:  Diagnosis Date   Alcohol intoxication (HCC) 05/2014    ED note    Anxiety    Arthritis    Boils    Chronic back pain     Community acquired pneumonia 01/05/2016   Depression    Fibromyalgia    GERD (gastroesophageal reflux disease)    Heart attack (HCC)    Hepatic cyst 01/06/2016   Hyperlipidemia    Hypertension    Insomnia    Leg cramps    Migraines    with aura   Mini stroke 05/2014   with paralysis for 1 hr   Osteopenia    PONV (postoperative nausea and vomiting)    severe   Postmenopausal bleeding 12/2015   PTSD (post-traumatic stress disorder)    S/P epidural steroid injection    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, passed out, 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  Not on anticoagulation prior to admission.  Pharmacy consulted for heparin  dosing.  -Chest CT w/ Type B dissection (small ulcerated plaque/developing intramural hematoma ~8 mm x 14 mm) -Received ~7500 units of heparin  in cath ~1430. -CBC stable - Hgb 13.1, Plt 153.  -TR band removed @1820 , ACT at the time 164 (goal > 180 off heparin ) - OK to restart prior to 2h after TR band off with low ACT per HF   6/11 8AM heparin  level <0.10, undetectable with heparin  infusion at 600 units/h. No issues reported by RN. Hgb 11.7, plt 118 low stable. Will continue to increase infusion rate conservatively.  Goal of Therapy:  Heparin  level 0.3-0.5 units/ml Monitor platelets by anticoagulation protocol: Yes   Plan:  Continue bicarb Impella purge Increase heparin  infusion to 800 units/hr  Heparin  level q6h x24h, then q12h Daily CBC, heparin  levels  Monitor for signs/symptoms of bleeding  Jerrel Mor, PharmD PGY1 Pharmacy Resident 06/28/2023 9:27 AM

## 2023-06-28 NOTE — Progress Notes (Signed)
 ANTICOAGULATION CONSULT NOTE  Pharmacy Consult for heparin  Indication: Impella RP  Allergies  Allergen Reactions   Codeine Other (See Comments)    headache   Crestor [Rosuvastatin] Other (See Comments)    Muscle aches   Other Nausea And Vomiting    Anesthesia-severe vomiting   Simvastatin Other (See Comments)    Muscle aches   Egg White (Egg Protein)     Other Reaction(s): Throat closes   Ezetimibe      Other Reaction(s): crippling muscle aches   Hydrocodone-Acetaminophen      Other Reaction(s): migraines   Peanut (Diagnostic)     Other Reaction(s): itchy   Shellfish Allergy Other (See Comments)    Throat swelling   Statins     Patient Measurements: Height: 5' 3 (160 cm) Weight: 72.6 kg (160 lb) IBW/kg (Calculated) : 52.4 Heparin  Dosing Weight: 68 kg   Vital Signs: Temp: 99.3 F (37.4 C) (06/11 0100) Temp Source: Core (06/11 0000) BP: 142/102 (06/10 1631) Pulse Rate: 55 (06/11 0100)  Labs: Recent Labs    06/27/23 1020 06/27/23 1034 06/27/23 1200 06/27/23 1231 06/27/23 1315 06/27/23 1629 06/27/23 1700 06/27/23 2232 06/28/23 0101  HGB 13.1 13.6  13.3  --    < > 13.3  12.6 13.3 12.9  --   --   HCT 39.8 40.0  39.0  --    < > 39.0  37.0 38.4 38.0  --   --   PLT 153  --   --   --   --  168  --   --   --   HEPARINUNFRC  --   --   --   --   --   --   --   --  <0.10*  CREATININE 1.49* 1.40*  --   --   --   --   --  1.18*  --   TROPONINIHS 5,310*  --  3,412*  --   --  6,049*  --   --   --    < > = values in this interval not displayed.    Estimated Creatinine Clearance: 41.2 mL/min (A) (by C-G formula based on SCr of 1.18 mg/dL (H)).  Medical History: Past Medical History:  Diagnosis Date   Alcohol intoxication (HCC) 05/2014    ED note    Anxiety    Arthritis    Boils    Chronic back pain    Community acquired pneumonia 01/05/2016   Depression    Fibromyalgia    GERD (gastroesophageal reflux disease)    Heart attack (HCC)    Hepatic cyst  01/06/2016   Hyperlipidemia    Hypertension    Insomnia    Leg cramps    Migraines    with aura   Mini stroke 05/2014   with paralysis for 1 hr   Osteopenia    PONV (postoperative nausea and vomiting)    severe   Postmenopausal bleeding 12/2015   PTSD (post-traumatic stress disorder)    S/P epidural steroid injection    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  Not  on anticoagulation prior to admission.  Pharmacy consulted for heparin  dosing.  -Chest CT w/ Type B dissection (small ulcerated plaque/developing intramural hematoma ~8 mm x 14 mm) -Received ~7500 units of heparin  in cath ~1430. -CBC stable - Hgb 13.1, Plt 153.  -TR band removed @1820 , ACT at the time 164 (goal > 180 off heparin ) - OK to restart prior to 2h after TR band off with low ACT per HF   6/11 AM: heparin  level <0.10, no issues reported by RN. Will increase infusion rate conservatively   Goal of Therapy:  Heparin  level 0.3-0.5 units/ml Monitor platelets by anticoagulation protocol: Yes   Plan:  Continue bicarb Impella purge Increase heparin  infusion to 600 units/hr  Heparin  level q6h x24h, then q12h Daily CBC, heparin  levels  Monitor for s/sx of bleeding  Mohammed Andrew, PharmD Clinical Pharmacist 06/28/2023 2:41 AM Please check AMION for all [facility] numbers

## 2023-06-28 NOTE — Progress Notes (Signed)
 L radial line stopped working due to kink Attempted rewiring unsuccessful Attempted again with fresh stick no wire thread Therefore new one placed on R, no immediate complications Pressure dressing to L radial stick  Ardelle Kos MD PCCM

## 2023-06-28 NOTE — Progress Notes (Signed)
 ANTICOAGULATION CONSULT NOTE  Pharmacy Consult for heparin  Indication: Impella RP  Allergies  Allergen Reactions   Codeine Other (See Comments)    headache   Crestor [Rosuvastatin] Other (See Comments)    Muscle aches   Other Nausea And Vomiting    Anesthesia-severe vomiting   Simvastatin Other (See Comments)    Muscle aches   Egg White (Egg Protein)     Other Reaction(s): Throat closes   Ezetimibe      Other Reaction(s): crippling muscle aches   Hydrocodone-Acetaminophen      Other Reaction(s): migraines   Peanut (Diagnostic)     Other Reaction(s): itchy   Shellfish Allergy Other (See Comments)    Throat swelling   Statins     Patient Measurements: Height: 5' 3 (160 cm) Weight: 75.2 kg (165 lb 12.6 oz) IBW/kg (Calculated) : 52.4 Heparin  Dosing Weight: 68 kg   Vital Signs: Temp: 99.1 F (37.3 C) (06/11 1814) Temp Source: Core (06/11 1200) Pulse Rate: 57 (06/11 1814)  Labs: Recent Labs    06/27/23 1020 06/27/23 1034 06/27/23 1200 06/27/23 1629 06/27/23 1700 06/27/23 2232 06/28/23 0101 06/28/23 0444 06/28/23 0455 06/28/23 0730 06/28/23 0800 06/28/23 0808 06/28/23 1017 06/28/23 1721  HGB 13.1 13.6  13.3   < > 13.3   < >  --   --  11.6* 11.7*  --   --  11.2*  --   --   HCT 39.8 40.0  39.0   < > 38.4   < >  --   --  34.0* 34.5*  --   --  33.0*  --   --   PLT 153  --   --  168  --   --   --   --  118*  --   --   --   --   --   HEPARINUNFRC  --   --   --   --   --   --  <0.10*  --   --   --  <0.10*  --   --  <0.10*  CREATININE 1.49* 1.40*  --   --   --  1.18*  --   --  1.06*  --   --   --   --   --   TROPONINIHS 5,310*  --    < > 6,049*  --   --   --   --   --  4,251*  --   --  3,754*  --    < > = values in this interval not displayed.    Estimated Creatinine Clearance: 46.6 mL/min (A) (by C-G formula based on SCr of 1.06 mg/dL (H)).  Medical History: Past Medical History:  Diagnosis Date   Alcohol intoxication (HCC) 05/2014    ED note    Anxiety     Arthritis    Boils    Chronic back pain    Community acquired pneumonia 01/05/2016   Depression    Fibromyalgia    GERD (gastroesophageal reflux disease)    Heart attack (HCC)    Hepatic cyst 01/06/2016   Hyperlipidemia    Hypertension    Insomnia    Leg cramps    Migraines    with aura   Mini stroke 05/2014   with paralysis for 1 hr   Osteopenia    PONV (postoperative nausea and vomiting)    severe   Postmenopausal bleeding 12/2015   PTSD (post-traumatic stress disorder)    S/P epidural steroid injection  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  Not on anticoagulation prior to admission.  Pharmacy consulted for heparin  dosing.  -Chest CT w/ Type B dissection (small ulcerated plaque/developing intramural hematoma ~8 mm x 14 mm)  6/11 8AM heparin  level <0.10, undetectable with heparin  infusion at 600 units/h. No issues reported by RN. Hgb 11.7, plt 118 low stable. Will continue to increase infusion rate conservatively.  6/11 PM - heparin  level remains undetectable.    Goal of Therapy:  Heparin  level 0.3-0.5 units/ml Monitor platelets by anticoagulation protocol: Yes   Plan:  Continue bicarb Impella purge Increase heparin  infusion to 1000 units/hr  8h heparin  level Daily CBC, heparin  levels  Monitor for signs/symptoms of bleeding  Cecillia Cogan, PharmD Clinical Pharmacist 06/28/2023  6:35 PM

## 2023-06-28 NOTE — Progress Notes (Signed)
 Advanced Heart Failure Rounding Note   Subjective:    Remains on Impella at P-7 flowing 3.2 L  NE down to 5.   Early am co-ox 54% but unsure if accurate.   Continues with periods of bradycardia/junctional rhythm and pauses  Feeling much better. No further CP or SOB. Neck is sore  SCr 1.4 -> 1.06 Na 126 LDH 498  CVP 18 PA 39/22   Objective:   Weight Range:  Vital Signs:   Temp:  [93.7 F (34.3 C)-100.4 F (38 C)] 98.8 F (37.1 C) (06/11 0415) Pulse Rate:  [0-85] 40 (06/11 0415) Resp:  [9-24] 17 (06/11 0415) BP: (65-157)/(43-112) 142/102 (06/10 1631) SpO2:  [77 %-100 %] 91 % (06/11 0415) Arterial Line BP: (72-187)/(42-120) 108/49 (06/11 0345) Weight:  [72.6 kg-75.2 kg] 75.2 kg (06/11 0615) Last BM Date :  (PTA)  Weight change: Filed Weights   06/27/23 0958 06/28/23 0615  Weight: 72.6 kg 75.2 kg    Intake/Output:   Intake/Output Summary (Last 24 hours) at 06/28/2023 0728 Last data filed at 06/28/2023 0600 Gross per 24 hour  Intake 1863.83 ml  Output 600 ml  Net 1263.83 ml     Physical Exam: General:  Sitting up in bed  No resp difficulty HEENT: normal Neck: supple. RIJ Impella. LIJ swan . Carotids 2+ bilat; no bruits. No lymphadenopathy or thryomegaly appreciated. Cor: Irregular impella hum  Lungs: course Abdomen: obese soft, nontender, nondistended. No hepatosplenomegaly. No bruits or masses. Good bowel sounds. Extremities: no cyanosis, clubbing, rash, tr edema warm Neuro: alert & orientedx3, cranial nerves grossly intact. moves all 4 extremities w/o difficulty. Affect pleasant  Telemetry: Sinus brady with periods of junctional rhythm in 30s. Intermittent pauses up to 2.5-3.0 secs Personally reviewed   Labs: Basic Metabolic Panel: Recent Labs  Lab 06/27/23 1020 06/27/23 1034 06/27/23 1231 06/27/23 1315 06/27/23 1700 06/27/23 2232 06/28/23 0444 06/28/23 0455  NA 134* 132*  133*   < > 125*  130* 131* 129* 129* 126*  K 4.1 4.0  4.0   < >  3.9  3.7 3.8 4.2 4.4 4.3  CL 99 100  --   --   --  99  --  97*  CO2 19*  --   --   --   --  22  --  21*  GLUCOSE 110* 109*  --   --   --  118*  --  135*  BUN 31* 32*  --   --   --  29*  --  26*  CREATININE 1.49* 1.40*  --   --   --  1.18*  --  1.06*  CALCIUM 8.6*  --   --   --   --  8.1*  --  7.8*  MG  --   --   --   --   --  1.6*  --   --    < > = values in this interval not displayed.    Liver Function Tests: Recent Labs  Lab 06/27/23 1020 06/27/23 2232  AST 123* 131*  ALT 123* 124*  ALKPHOS 62 57  BILITOT 0.8 0.8  PROT 5.3* 5.3*  ALBUMIN 3.0* 3.0*   No results for input(s): LIPASE, AMYLASE in the last 168 hours. No results for input(s): AMMONIA in the last 168 hours.  CBC: Recent Labs  Lab 06/27/23 1020 06/27/23 1034 06/27/23 1315 06/27/23 1629 06/27/23 1700 06/28/23 0444 06/28/23 0455  WBC 15.6*  --   --  21.0*  --   --  15.1*  NEUTROABS 11.7*  --   --  16.6*  --   --   --   HGB 13.1   < > 13.3  12.6 13.3 12.9 11.6* 11.7*  HCT 39.8   < > 39.0  37.0 38.4 38.0 34.0* 34.5*  MCV 97.3  --   --  94.6  --   --  94.0  PLT 153  --   --  168  --   --  118*   < > = values in this interval not displayed.    Cardiac Enzymes: No results for input(s): CKTOTAL, CKMB, CKMBINDEX, TROPONINI in the last 168 hours.  BNP: BNP (last 3 results) Recent Labs    06/27/23 1020  BNP 543.0*    ProBNP (last 3 results) No results for input(s): PROBNP in the last 8760 hours.    Other results:  Imaging: DG CHEST PORT 1 VIEW Result Date: 06/28/2023 CLINICAL DATA:  [id number].  Right ventricular assist device present. EXAM: PORTABLE CHEST 1 VIEW COMPARISON:  Portable chest yesterday at 4:33 p.m. FINDINGS: 5:16 a.m. Left IJ approach catheter again terminates in the right lung base probably in a lower lobe branch. Right IJ approach Impella device as before terminates in the main pulmonary artery. The cardiomediastinal silhouette and vascular pattern are normal. There is  calcification of the transverse aorta. There is increased linear atelectasis in the left base. The lungs are otherwise clear. There is no substantial pleural effusion. Overall aeration is otherwise unchanged. IMPRESSION: 1. Increased linear atelectasis in the left base. No other interval change. 2. Left IJ approach catheter again terminates in the right lung base probably in a lower lobe branch. 3. Right IJ approach Impella device as before terminates in the main pulmonary artery. Electronically Signed   By: Denman Fischer M.D.   On: 06/28/2023 05:56   CARDIAC CATHETERIZATION Result Date: 06/27/2023   The left ventricular ejection fraction is greater than 65% by visual estimate. Findings: On NE 15 Ao = 123/68(93) LV = 97/19 RA = 18 RV = 35/17 PA =  35/21 (26) PCW = 19 Fick cardiac output/index = 2.3/1.3 Thermodilution CO/CI = 2.9/1.6 PVR = 0.8 WU Ao sat = 96% PA sat = 37%, 41% PAPi = 0.78 Assessment: 1. Left dominant coronary system with normal LM, LAD and LCx. Small non-dominant RCA not visualized (previously seen in 2017) 2. LVEF 65% 3. Evidence of severe RV failure with profound shock requiring placement of Impella RP support device Jules Oar, MD 11:44 PM  US  Abdomen Limited RUQ (LIVER/GB) Result Date: 06/27/2023 CLINICAL DATA:  Prominent gallbladder on recent CT examination EXAM: ULTRASOUND ABDOMEN LIMITED RIGHT UPPER QUADRANT COMPARISON:  CT from earlier in the same day. FINDINGS: Gallbladder: No gallstones or wall thickening visualized. No sonographic Murphy sign noted by sonographer. Common bile duct: Diameter: 2.7 mm Liver: Mildly increased in echogenicity consistent with fatty infiltration. Portal vein is patent on color Doppler imaging with normal direction of blood flow towards the liver. Other: None. IMPRESSION: Fatty liver. No acute abnormality in the gallbladder. Electronically Signed   By: Violeta Grey M.D.   On: 06/27/2023 21:45   ECHOCARDIOGRAM LIMITED Result Date: 06/27/2023     ECHOCARDIOGRAM LIMITED REPORT   Patient Name:   Vicki [name] Date of Exam: 06/27/2023 Medical Rec #:  [id number]       Height:       63.0 in Accession #:    [id number]      Weight:  160.0 lb Date of Birth:  03-30-1950       BSA:          1.759 m Patient Age:    73 years        BP:           142/102 mmHg Patient Gender: F               HR:           72 bpm. Exam Location:  Inpatient Procedure: Limited Echo (Both Spectral and Color Flow Doppler were utilized            during procedure). Indications:    I50.9* Heart failure (unspecified)  History:        Patient has prior history of Echocardiogram examinations, most                 recent 06/27/2023. CHF; CAD.  Sonographer:    Meagan Baucom RDCS, FE, PE Sonographer#2:  Raynelle Callow RDCS Referring Phys: [id number] ADITYA Bruce Caper  Sonographer Comments: Technically difficult study due to poor echo windows. Image acquisition challenging due to breast implants. Mclean present. Impella device in PA. IMPRESSIONS  1. Left ventricular ejection fraction, by estimation, is 60 to 65%. The left ventricle has normal function. The left ventricle has no regional wall motion abnormalities.  2. RV strain attempted- unable to analyze form subcostal     RV Impella device noted.     Inlet port visualized at IVC/RA junction     Cannula traverses tricuspid valve along the RV free wall, no entanglement     Outlet port visualized in main PA     Tricuspid regurgitation not assessed. Right ventricular systolic function is moderately reduced.  3. The inferior vena cava is normal in size with <50% respiratory variability, suggesting right atrial pressure of 8 mmHg. FINDINGS  Left Ventricle: Left ventricular ejection fraction, by estimation, is 60 to 65%. The left ventricle has normal function. The left ventricle has no regional wall motion abnormalities. Right Ventricle: RV strain attempted- unable to analyze form subcostal RV Impella device noted. Inlet port visualized at IVC/RA junction  Cannula traverses tricuspid valve along the RV free wall, no entanglement Outlet port visualized in main PA Tricuspid regurgitation not assessed. Right ventricular systolic function is moderately reduced. Pulmonary Artery: The pulmonary artery is of normal size. Venous: The inferior vena cava is normal in size with less than 50% respiratory variability, suggesting right atrial pressure of 8 mmHg. Gloriann Larger MD Electronically signed by Gloriann Larger MD Signature Date/Time: 06/27/2023/5:51:13 PM    Final    DG CHEST PORT 1 VIEW Result Date: 06/27/2023 CLINICAL DATA:  [id number] Central venous catheter in place, temporary [id number] EXAM: PORTABLE CHEST - 1 VIEW COMPARISON:  June 27, 2023, 10:34 a.m. FINDINGS: Elevation of the right hemidiaphragm. No focal airspace consolidation, pleural effusion, or pneumothorax. No cardiomegaly. Right IJ approach Impella device, terminating in the region of the main pulmonary artery. Left IJ approach introducer sheath with a pulmonary artery catheter, which terminates in the right lung base. IMPRESSION: 1. Right IJ approach Impella device terminating in the region the main pulmonary artery. 2. Left IJ approach pulmonary artery catheter terminating in the right lung base. 3. No pneumonia or pulmonary edema. Electronically Signed   By: Rance Burrows M.D.   On: 06/27/2023 16:51   ECHOCARDIOGRAM COMPLETE Result Date: 06/27/2023    ECHOCARDIOGRAM REPORT   Patient Name:   Vicki [name] Date of Exam: 06/27/2023 Medical Rec #:  [id number]       Height:       63.0 in Accession #:    [id number]      Weight:       160.0 lb Date of Birth:  29-Sep-1950       BSA:          1.759 m Patient Age:    73 years        BP:           69/45 mmHg Patient Gender: F               HR:           45 bpm. Exam Location:  Inpatient Procedure: 2D Echo, Cardiac Doppler and Color Doppler (Both Spectral and Color            Flow Doppler were utilized during procedure). Indications:    Shock  History:         Patient has no prior history of Echocardiogram examinations.  Sonographer:    Astrid Blamer Referring Phys: [id number] BRITTAINY M SIMMONS IMPRESSIONS  1. Left ventricular ejection fraction, by estimation, is 60 to 65%. The left ventricle has normal function. Left ventricular endocardial border not optimally defined to evaluate regional wall motion. Left ventricular diastolic function could not be evaluated.  2. 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 Right ventricular systolic function is mildly reduced. The right ventricular size is normal.  3. The mitral valve is grossly normal. No evidence of mitral valve regurgitation.  4. The aortic valve was not well visualized. Aortic valve regurgitation is not visualized.  5. The inferior vena cava is dilated in size with <50% respiratory variability, suggesting right atrial pressure of 15 mmHg.  6. This is a limited study. This study does not evaluated all parameters for restrictive cardiomyopathy. Consider repeat study in the future if clinically indicated; at that time TDI would be usefuly. Comparison(s): No prior Echocardiogram. FINDINGS  Left Ventricle: Left ventricular ejection fraction, by estimation, is 60 to 65%. The left ventricle has normal function. Left ventricular endocardial border not optimally defined to evaluate regional wall motion. The left ventricular internal cavity size was normal in size. Suboptimal image quality limits for assessment of left ventricular hypertrophy. Left ventricular diastolic function could not be evaluated. 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 The right ventricular size is normal. No increase in right ventricular wall thickness. Right ventricular systolic function is mildly reduced. Left Atrium: Left atrial size was not well visualized. Right Atrium: Right atrial size was not well visualized. Pericardium: Pericardium appears thick  but is not well visualized. This is best seen in clip 28. There is no evidence of pericardial effusion. The pericardial effusion appears to contain mixed echogenic material. Thickening/calcification of pericardium present. Mitral Valve: The mitral valve is grossly normal. No evidence of mitral valve regurgitation. Tricuspid Valve: The tricuspid valve is not well visualized. Tricuspid valve regurgitation is not demonstrated. Aortic Valve: The aortic valve was not well visualized. Aortic valve regurgitation is not visualized. Pulmonic Valve: The pulmonic valve was not well visualized. Pulmonic valve regurgitation is not visualized. Aorta: The ascending aorta was not well visualized and the aortic arch was not well visualized. Venous: The inferior vena cava is dilated in size with less than 50% respiratory variability, suggesting right atrial pressure of 15 mmHg. IAS/Shunts: The interatrial septum was not well visualized. Additional Comments: A venous catheter is visualized in  the right atrium. Gloriann Larger MD Electronically signed by Gloriann Larger MD Signature Date/Time: 06/27/2023/2:20:32 PM    Final    CT Angio Chest/Abd/Pel for Dissection W and/or W/WO Result Date: 06/27/2023 CLINICAL DATA:  Acute aortic syndrome EXAM: CT ANGIOGRAPHY CHEST, ABDOMEN AND PELVIS TECHNIQUE: Non-contrast CT of the chest was initially obtained. Multidetector CT imaging through the chest, abdomen and pelvis was performed using the standard protocol during bolus administration of intravenous contrast. Multiplanar reconstructed images and MIPs were obtained and reviewed to evaluate the vascular anatomy. RADIATION DOSE REDUCTION: This exam was performed according to the departmental dose-optimization program which includes automated exposure control, adjustment of the mA and/or kV according to patient size and/or use of iterative reconstruction technique. CONTRAST:  OMNIPAQUE IOHEXOL 350 MG/ML SOLN COMPARISON:  CT  abdomen and pelvis January 06, 2016 FINDINGS: CTA CHEST FINDINGS Cardiovascular: Satisfactory opacification of the pulmonary arteries to the segmental level. No evidence of pulmonary embolism. Normal heart size. No pericardial effusion. Subtle calcifications of the aortic wall without coronary artery calcifications. Mediastinum/Nodes: No enlarged mediastinal, hilar, or axillary lymph nodes. Thyroid  gland, trachea, and esophagus demonstrate no significant findings. Lungs/Pleura: Subtle ground-glass appearance of both lungs with fibro atelectatic changes of the right upper lobe may correlate with chronic interstitial lung disease without evidence of acute infiltrates consolidations or pulmonary edema. No pulmonary nodules. No pneumonia or pleural effusions Musculoskeletal: No chest wall abnormality. No acute or significant osseous findings. Review of the MIP images confirms the above findings. CTA ABDOMEN AND PELVIS FINDINGS VASCULAR Aorta: Normal caliber aorta without aneurysm, dissection, vasculitis or significant stenosis. Irregular atheromatous plaque along the lateral wall of the aorta from the 1 to 5 o'clock distribution within the mid and distal descending thoracic aorta with a small ulcerated plaque on image number 66 these findings could correlate with a developing intramural hematoma measuring about 8 mm in maximum thickness by 14 mm in maximum AP diameter as measured on image number 70. Celiac: Patent without evidence of aneurysm, dissection, vasculitis or significant stenosis. SMA: Patent without evidence of aneurysm, dissection, vasculitis or significant stenosis. Renals: Both renal arteries are patent without evidence of aneurysm, dissection, vasculitis, fibromuscular dysplasia or significant stenosis. IMA: Patent without evidence of aneurysm, dissection, vasculitis or significant stenosis. Inflow: Patent without evidence of aneurysm, dissection, vasculitis or significant stenosis. Veins: No obvious  venous abnormality within the limitations of this arterial phase study. Review of the MIP images confirms the above findings. NON-VASCULAR Hepatobiliary: Liver normal. Stable 10 mm hepatic cyst anterolateral margin of the right lobe of the liver compared with prior examination. Gallbladder appears slightly distended mildly prominent with poor definition of the fundus. Consider ultrasound evaluation to rule out associated acute cholecystitis. Pancreas: Unremarkable. No pancreatic ductal dilatation or surrounding inflammatory changes. Spleen: Normal in size without focal abnormality. Adrenals/Urinary Tract: Adrenal glands are unremarkable. Kidneys are normal, without renal calculi, focal lesion, or hydronephrosis. Bladder is unremarkable. Stomach/Bowel: Stomach is within normal limits. Appendix appears normal. No evidence of bowel wall thickening, distention, or inflammatory changes. Lymphatic: Unremarkable Reproductive: Uterus and bilateral adnexa are unremarkable. Other: No abdominal wall hernia or abnormality. No abdominopelvic ascites. Musculoskeletal: No fracture is seen. Post L4-5 transpedicular fixation and disc arthroplasty Review of the MIP images confirms the above findings. IMPRESSION: *No evidence of pulmonary embolism. *Irregular atheromatous plaque along the lateral wall of the mid and distal descending thoracic aorta with a small ulcerated plaque. These findings could correlate with a developing intramural hematoma measuring about 8 mm in maximum  thickness by 14 mm in maximum AP diameter as measured on image number 70. (Intramural hematoma Stanford type B) *Subtle ground-glass appearance of both lungs with fibro atelectatic changes of the right upper lobe may correlate with chronic interstitial lung disease without evidence of acute infiltrates consolidations or pulmonary edema. *Gallbladder appears slightly distended mildly prominent with poor definition of the fundus. Consider ultrasound evaluation to  exclude associated acute cholecystitis. Critical Value/emergent results were called by telephone at the time of interpretation on 06/27/2023 at 11:09 am to provider [facility] , who verbally acknowledged these results. Electronically Signed   By: Fredrich Jefferson M.D.   On: 06/27/2023 11:10   DG Chest Portable 1 View Result Date: 06/27/2023 CLINICAL DATA:  Shortness of breath and midsternal chest pain radiating to the back and jaw for the past few days. Recently diagnosed with atrial fibrillation. EXAM: PORTABLE CHEST 1 VIEW COMPARISON:  12/31/2018 FINDINGS: The heart size and mediastinal contours are within normal limits. Both lungs are clear. The visualized skeletal structures are unremarkable. IMPRESSION: No active disease. Electronically Signed   By: Catherin Closs M.D.   On: 06/27/2023 10:42     Medications:     Scheduled Medications:  aspirin   81 mg Oral Daily   Chlorhexidine  Gluconate Cloth  6 each Topical Daily    HYDROmorphone  (DILAUDID ) injection  0.5 mg Intravenous Once   levothyroxine  50 mcg Oral Daily   potassium chloride   40 mEq Oral BID   sertraline   100 mg Oral Daily   sodium chloride  flush  3 mL Intravenous Q12H    Infusions:  sodium chloride  10 mL/hr at 06/28/23 0500   sodium chloride      heparin  600 Units/hr (06/28/23 0500)   norepinephrine (LEVOPHED) Adult infusion 5 mcg/min (06/28/23 0612)   sodium bicarbonate 25 mEq (Impella PURGE) in dextrose  5 % 1000 mL bag      PRN Medications: sodium chloride , sodium chloride , acetaminophen , fentaNYL  (SUBLIMAZE ) injection, ondansetron  (ZOFRAN ) IV, oxyCODONE , sodium chloride  flush, zolpidem    Assessment/Plan:   1. Cardiogenic Shock, 2/2 Profound RV Failure   - cath 06/27/23 normal left-dominant system with non-visualized tiny RCA - CT no PE - Echo LVEF 60-65% RV moderate HK with McConnell's sign - s/p Impella RP placement. Parameters reviewed personally. Position stable on CXR.  Will leave in for several days for RV  rest. Bicarb in purge - (d/w PharmD) - Remains on NE 5  - Wean NE as tolerated - Co-ox 54%. Will repeat  - Add DBA for RV and rhythm support  2. NSTEMI with RV infarct - plan as above - continue ASA/statin - No b-blocker with shock  3. 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 Shock Liver - in setting of RV failure  - continue supportive care   6. AKI  - Initial SCr 1.4 due to ATN/shock - now resolved   7. Hyponatremia - due to HF.  - restrict FW  Family updated at bedside.   CRITICAL CARE Performed by: Jules Oar  Total critical care time: 60 minutes  Critical care time was exclusive of separately billable procedures and treating other patients.  Critical care was necessary to treat or prevent imminent or life-threatening deterioration.  Critical care was time spent personally  by me (independent of midlevel providers or residents) on the following activities: development of treatment plan with patient and/or surrogate as well as nursing, discussions with consultants, evaluation of patient's response to treatment, examination of patient, obtaining history from patient or surrogate, ordering and performing treatments and interventions, ordering and review of laboratory studies, ordering and review of radiographic studies, pulse oximetry and re-evaluation of patient's condition.    Length of Stay: 1   Jules Oar MD 06/28/2023, 7:28 AM  Advanced Heart Failure Team Pager [phone] (M-F; 7a - 4p)  Please contact [facility] for night-coverage after hours (4p -7a ) and weekends on [email or web address]

## 2023-06-28 NOTE — Progress Notes (Signed)
 NAME:  RAMATOULAYE PACK, MRN:  [id number], DOB:  1950/09/07, LOS: 1 ADMISSION DATE:  06/27/2023, CONSULTATION DATE:  06/27/23 REFERRING MD:  Bensimhon, CHIEF COMPLAINT:  acute RV failure   History of Present Illness:  Ms. Care is a 73 y/o woman with a history of HTN, HLD, former tobacco abuse who presented with CP x 3 days that radiated to the jaw and back. She had associated nausea w/ vomiting, lightheadedness, weakness, and diaphoresis. She had these symptoms evaluated at [facility] on 6/8 but refused to go to the ED for further evaluation despite this recommendation. She was diagnosed with atrial fibrillation during that visit. Today she presented with worsening og SOB, dizziness, vomiting, CP. She took [place] powder x 2 at home prior to presentation. The past few days she has not been able to keep anything down and despite taking her BP meds, she has vomited them up. In the ED she was noted to be in Afib with HR 30-40s and hypotension. CTA did not demonstrate PE. Cardiology was consulted and she was evaluated with L&R heart catherization, found to have acute RV failure with low CO and no obvious acute coronary blockage, but her RCA was not able to be seen angiographically. She had an RP Impella placed for R-sided MCS. PCCM was consulted for ICU management.   Pertinent  Medical History  Stroke Seizures PTSD HTN HLD GERD FM  Significant Hospital Events: Including procedures, antibiotic start and stop dates in addition to other pertinent events   6/10 admitted, LHC, RHC, RP impella placed  Interim History / Subjective:  Feels ok but a bit weak. CXR with atelectasis. Claudie Cull a bit far. On Impella P-7 with flows 3.2L and with NE at 4: CVP 11, CO 2.7, CI 1.5.  Remains bradycardic in 40s  Objective    Blood pressure (!) 142/102, pulse (!) 42, temperature 99.1 F (37.3 C), resp. rate 13, height 5' 3 (1.6 m), weight 75.2 kg, last menstrual period 07/18/2011, SpO2 92%. PAP: (8-53)/(-1-33) 32/24 CVP:   [0 mmHg-24 mmHg] 11 mmHg CO:  [2.5 L/min-4 L/min] 2.7 L/min CI:  [1.42 L/min/m2-2.3 L/min/m2] 1.5 L/min/m2      Intake/Output Summary (Last 24 hours) at 06/28/2023 0855 Last data filed at 06/28/2023 0800 Gross per 24 hour  Intake 1975.57 ml  Output 600 ml  Net 1375.57 ml   Filed Weights   06/27/23 0958 06/28/23 0615  Weight: 72.6 kg 75.2 kg    Examination: General: chronically ill appearing woman lying in bed in NAD, family at bedside HENT: [place]/AT, eyes anicteric Lungs: breathing comfortably on RA, CTAB Cardiovascular: S1S2, RRR Abdomen: abd soft, NT Extremities: no cyanosis or edema Neuro: awake, alert, answering questions appropriately, moving all extremities   EKG personally reviewed> bradycardic Afib, normal axis, delayed R wave progression across precordial leads.  EKG 6/8: Afib, QS waves in inferior leads with TWI, R waves in V1.  CT chest personally reviewed: mild nonspecific GGO throughout both lungs, no architectural distortion. No PE. Dilated IVC with dilated intrahepatic veins. Descending aortic plaque, small intramural hematoma. Slightly abnormal GB.    Assessment and Plan   Cardiogenic shock due to acute RV failure- although she has a small RCA previously  Symptomatic bradycardia Atrial fibrillation with slow ventricular response -RP impella at P-7 -cont NE at 5 -cont heparin   -cont swan -serial coox -hold PTA antihypertensives and all nodal blockers -statin- intolerant hold PTA repatha , con't daily aspirin  -monitor electrolytes and replete as needed -might need TVP, monitoring for now  Nausea and vomiting due to acute RV failure - improved -clear liquids for now, ADAT -zofran  PRN  H/o insomnia -con't PTA zolpidem  PRN  Hyponatremia AKI Lactic acidosis due to cardiogenic shock -strict I/O -supportive care measures -follow BMP -renally dose meds, avoid nephrotoxic meds  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 -monitor -treat RV failure as  above  Remote h/o tobacco abuse -recommend lung cancer screening referral at discharge  Hypothyroidism -con't PTA synthroid  Best Practice (right click and Reselect all SmartList Selections daily)   Diet/type: clear liquids DVT prophylaxis systemic heparin  Pressure ulcer(s): pressure ulcer assessment deferred -- see RN assessment GI prophylaxis: N/A Lines: Central line and Arterial Line Foley:  N/A Code Status:  full code Last date of multidisciplinary goals of care discussion [patient updated 6/10]   Critical care time: 30 min    Rafael Bun, PA - [facility] For pager details, please see AMION or use Epic chat  After 1900, please call ELINK for cross coverage needs 06/28/2023, 9:15 AM

## 2023-06-29 ENCOUNTER — Inpatient Hospital Stay (HOSPITAL_COMMUNITY)

## 2023-06-29 DIAGNOSIS — D696 Thrombocytopenia, unspecified: Secondary | ICD-10-CM

## 2023-06-29 DIAGNOSIS — R570 Cardiogenic shock: Secondary | ICD-10-CM | POA: Diagnosis not present

## 2023-06-29 DIAGNOSIS — I50811 Acute right heart failure: Secondary | ICD-10-CM | POA: Diagnosis not present

## 2023-06-29 LAB — LACTATE DEHYDROGENASE: LDH: 547 U/L — ABNORMAL HIGH (ref 98–192)

## 2023-06-29 LAB — POCT I-STAT 7, (LYTES, BLD GAS, ICA,H+H)
Acid-base deficit: 1 mmol/L (ref 0.0–2.0)
Bicarbonate: 22.6 mmol/L (ref 20.0–28.0)
Calcium, Ion: 1.16 mmol/L (ref 1.15–1.40)
HCT: 30 % — ABNORMAL LOW (ref 36.0–46.0)
Hemoglobin: 10.2 g/dL — ABNORMAL LOW (ref 12.0–15.0)
O2 Saturation: 96 %
Patient temperature: 36.8
Potassium: 3.9 mmol/L (ref 3.5–5.1)
Sodium: 128 mmol/L — ABNORMAL LOW (ref 135–145)
TCO2: 24 mmol/L (ref 22–32)
pCO2 arterial: 33.8 mmHg (ref 32–48)
pH, Arterial: 7.431 (ref 7.35–7.45)
pO2, Arterial: 80 mmHg — ABNORMAL LOW (ref 83–108)

## 2023-06-29 LAB — BASIC METABOLIC PANEL WITH GFR
Anion gap: 9 (ref 5–15)
BUN: 14 mg/dL (ref 8–23)
CO2: 23 mmol/L (ref 22–32)
Calcium: 8 mg/dL — ABNORMAL LOW (ref 8.9–10.3)
Chloride: 97 mmol/L — ABNORMAL LOW (ref 98–111)
Creatinine, Ser: 0.77 mg/dL (ref 0.44–1.00)
GFR, Estimated: >60 mL/min (ref >60)
Glucose, Bld: 149 mg/dL — ABNORMAL HIGH (ref 70–99)
Potassium: 3.8 mmol/L (ref 3.5–5.1)
Sodium: 129 mmol/L — ABNORMAL LOW (ref 135–145)

## 2023-06-29 LAB — HEPATIC FUNCTION PANEL
ALT: 109 U/L — ABNORMAL HIGH (ref 0–44)
AST: 97 U/L — ABNORMAL HIGH (ref 15–41)
Albumin: 2.7 g/dL — ABNORMAL LOW (ref 3.5–5.0)
Alkaline Phosphatase: 47 U/L (ref 38–126)
Bilirubin, Direct: 0.4 mg/dL — ABNORMAL HIGH (ref 0.0–0.2)
Indirect Bilirubin: 1.1 mg/dL — ABNORMAL HIGH (ref 0.3–0.9)
Total Bilirubin: 1.5 mg/dL — ABNORMAL HIGH (ref 0.0–1.2)
Total Protein: 5.2 g/dL — ABNORMAL LOW (ref 6.5–8.1)

## 2023-06-29 LAB — COOXEMETRY PANEL
Carboxyhemoglobin: 2.2 % — ABNORMAL HIGH (ref 0.5–1.5)
Carboxyhemoglobin: 1.2 % (ref 0.5–1.5)
Methemoglobin: <0.7 % (ref 0.0–1.5)
Methemoglobin: <0.7 % (ref 0.0–1.5)
O2 Saturation: 61 %
O2 Saturation: 57.1 %
Total hemoglobin: 10.7 g/dL — ABNORMAL LOW (ref 12.0–16.0)
Total hemoglobin: 10.1 g/dL — ABNORMAL LOW (ref 12.0–16.0)

## 2023-06-29 LAB — HEPARIN LEVEL (UNFRACTIONATED)
Heparin Unfractionated: 0.11 [IU]/mL — ABNORMAL LOW (ref 0.30–0.70)
Heparin Unfractionated: <0.1 [IU]/mL — ABNORMAL LOW (ref 0.30–0.70)
Heparin Unfractionated: 0.24 [IU]/mL — ABNORMAL LOW (ref 0.30–0.70)

## 2023-06-29 LAB — CBC
HCT: 31.4 % — ABNORMAL LOW (ref 36.0–46.0)
Hemoglobin: 10.6 g/dL — ABNORMAL LOW (ref 12.0–15.0)
MCH: 31.8 pg (ref 26.0–34.0)
MCHC: 33.8 g/dL (ref 30.0–36.0)
MCV: 94.3 fL (ref 80.0–100.0)
Platelets: 73 10*3/uL — ABNORMAL LOW (ref 150–400)
RBC: 3.33 MIL/uL — ABNORMAL LOW (ref 3.87–5.11)
RDW: 13.1 % (ref 11.5–15.5)
WBC: 12.3 10*3/uL — ABNORMAL HIGH (ref 4.0–10.5)
nRBC: 0 % (ref 0.0–0.2)

## 2023-06-29 LAB — MAGNESIUM: Magnesium: 2.1 mg/dL (ref 1.7–2.4)

## 2023-06-29 MED ORDER — VASOPRESSIN 20 UNITS/100 ML INFUSION FOR SHOCK
0.0000 [IU]/min | INTRAVENOUS | Status: DC
  Administered 2023-06-29: 0.03 [IU]/min via INTRAVENOUS
  Administered 2023-06-30: 0.01 [IU]/min via INTRAVENOUS
  Filled 2023-06-29 (×2): qty 100

## 2023-06-29 MED ORDER — HYDROXYZINE HCL 25 MG PO TABS
25.0000 mg | ORAL_TABLET | Freq: Three times a day (TID) | ORAL | Status: DC | PRN
  Administered 2023-06-29 – 2023-07-01 (×4): 25 mg via ORAL
  Filled 2023-06-29 (×4): qty 1

## 2023-06-29 MED ORDER — POTASSIUM CHLORIDE 10 MEQ/50ML IV SOLN
10.0000 meq | INTRAVENOUS | Status: AC
  Administered 2023-06-29 (×4): 10 meq via INTRAVENOUS
  Filled 2023-06-29 (×4): qty 50

## 2023-06-29 NOTE — Progress Notes (Signed)
 ANTICOAGULATION CONSULT NOTE  Pharmacy Consult for heparin  Indication: Impella RP  Allergies  Allergen Reactions   Codeine Other (See Comments)    headache   Crestor [Rosuvastatin] Other (See Comments)    Muscle aches   Other Nausea And Vomiting    Anesthesia-severe vomiting   Simvastatin Other (See Comments)    Muscle aches   Egg White (Egg Protein)     Other Reaction(s): Throat closes   Ezetimibe      Other Reaction(s): crippling muscle aches   Hydrocodone-Acetaminophen      Other Reaction(s): migraines   Peanut (Diagnostic)     Other Reaction(s): itchy   Shellfish Allergy Other (See Comments)    Throat swelling   Statins     Patient Measurements: Height: 5' 3 (160 cm) Weight: 75.2 kg (165 lb 12.6 oz) IBW/kg (Calculated) : 52.4 Heparin  Dosing Weight: 68 kg   Vital Signs: Temp: 98.8 F (37.1 C) (06/12 0330) Temp Source: Core (06/12 0000) Pulse Rate: 53 (06/12 0330)  Labs: Recent Labs    06/27/23 1020 06/27/23 1034 06/27/23 1200 06/27/23 1629 06/27/23 1700 06/27/23 2232 06/28/23 0101 06/28/23 0444 06/28/23 0455 06/28/23 0730 06/28/23 0800 06/28/23 0808 06/28/23 1017 06/28/23 1721 06/29/23 0324  HGB 13.1 13.6  13.3   < > 13.3   < >  --   --  11.6* 11.7*  --   --  11.2*  --   --   --   HCT 39.8 40.0  39.0   < > 38.4   < >  --   --  34.0* 34.5*  --   --  33.0*  --   --   --   PLT 153  --   --  168  --   --   --   --  118*  --   --   --   --   --   --   HEPARINUNFRC  --   --   --   --   --   --    < >  --   --   --  <0.10*  --   --  <0.10* 0.11*  CREATININE 1.49* 1.40*  --   --   --  1.18*  --   --  1.06*  --   --   --   --   --   --   TROPONINIHS 5,310*  --    < > 6,049*  --   --   --   --   --  4,251*  --   --  3,754*  --   --    < > = values in this interval not displayed.    Estimated Creatinine Clearance: 46.6 mL/min (A) (by C-G formula based on SCr of 1.06 mg/dL (H)).  Medical History: Past Medical History:  Diagnosis Date   Alcohol  intoxication (HCC) 05/2014    ED note    Anxiety    Arthritis    Boils    Chronic back pain    Community acquired pneumonia 01/05/2016   Depression    Fibromyalgia    GERD (gastroesophageal reflux disease)    Heart attack (HCC)    Hepatic cyst 01/06/2016   Hyperlipidemia    Hypertension    Insomnia    Leg cramps    Migraines    with aura   Mini stroke 05/2014   with paralysis for 1 hr   Osteopenia    PONV (postoperative nausea and vomiting)  severe   Postmenopausal bleeding 12/2015   PTSD (post-traumatic stress disorder)    S/P epidural steroid injection    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  Not on anticoagulation prior to admission.  Pharmacy consulted for heparin  dosing.  -Chest CT w/ Type B dissection (small ulcerated plaque/developing intramural hematoma ~8 mm x 14 mm)  6/11 8AM heparin  level <0.10, undetectable with heparin  infusion at 600 units/h. No issues reported by RN. Hgb 11.7, plt 118 low stable. Will continue to increase infusion rate conservatively.  6/11 PM - heparin  level remains undetectable.    6/12 AM update:  Heparin  level sub-therapeutic   Goal of Therapy:  Heparin  level 0.3-0.5 units/ml Monitor platelets by anticoagulation protocol: Yes   Plan:  Continue bicarb Impella purge Increase heparin  infusion to 1100 units/hr  8h heparin  level Daily CBC, heparin  levels  Monitor for signs/symptoms of bleeding  Silvestre Drum, PharmD, BCPS Clinical Pharmacist Phone: [phone]

## 2023-06-29 NOTE — Progress Notes (Signed)
 ANTICOAGULATION CONSULT NOTE Pharmacy Consult for heparin  Indication: Impella RP Brief A/P: Heparin  level subtherapeutic. Increase Heparin  rate  Allergies  Allergen Reactions   Codeine Other (See Comments)    headache   Crestor [Rosuvastatin] Other (See Comments)    Muscle aches   Other Nausea And Vomiting    Anesthesia-severe vomiting   Simvastatin Other (See Comments)    Muscle aches   Egg White (Egg Protein)     Other Reaction(s): Throat closes   Ezetimibe      Other Reaction(s): crippling muscle aches   Hydrocodone-Acetaminophen      Other Reaction(s): migraines   Peanut (Diagnostic)     Other Reaction(s): itchy   Shellfish Allergy Other (See Comments)    Throat swelling   Statins     Patient Measurements: Height: 5' 3 (160 cm) Weight: 76.2 kg (167 lb 15.9 oz) IBW/kg (Calculated) : 52.4 Heparin  Dosing Weight: 68 kg   Vital Signs: Temp: 99.3 F (37.4 C) (06/12 1922) Pulse Rate: 48 (06/12 1922)  Labs: Recent Labs    06/27/23 1629 06/27/23 1700 06/27/23 2232 06/28/23 0101 06/28/23 0455 06/28/23 0730 06/28/23 0800 06/28/23 0808 06/28/23 1017 06/28/23 1721 06/29/23 0324 06/29/23 0601 06/29/23 0609 06/29/23 1159 06/29/23 2249  HGB 13.3   < >  --    < > 11.7*  --   --  11.2*  --   --   --  10.6* 10.2*  --   --   HCT 38.4   < >  --    < > 34.5*  --   --  33.0*  --   --   --  31.4* 30.0*  --   --   PLT 168  --   --   --  118*  --   --   --   --   --   --  73*  --   --   --   HEPARINUNFRC  --   --   --    < >  --   --    < >  --   --    < > 0.11*  --   --  <0.10* 0.24*  CREATININE  --   --  1.18*  --  1.06*  --   --   --   --   --   --  0.77  --   --   --   TROPONINIHS 6,049*  --   --   --   --  4,251*  --   --  3,754*  --   --   --   --   --   --    < > = values in this interval not displayed.    Estimated Creatinine Clearance: 62.1 mL/min (by C-G formula based on SCr of 0.77 mg/dL).  Assessment: 73 yo female in cardiogenic shock with RV failure s/p  Impella RP 6/10 for heparin .    Goal of Therapy:  Heparin  level 0.3-0.5 units/ml Monitor platelets by anticoagulation protocol: Yes   Plan:  Increase Heparin  1350 units/hr Follow-up am labs.  Claudine Cullens, PharmD, BCPS  06/29/2023 11:14 PM

## 2023-06-29 NOTE — Plan of Care (Signed)
  Problem: Education: Goal: Knowledge of General Education information will improve Description: Including pain rating scale, medication(s)/side effects and non-pharmacologic comfort measures Outcome: Progressing   Problem: Health Behavior/Discharge Planning: Goal: Ability to manage health-related needs will improve Outcome: Progressing   Problem: Clinical Measurements: Goal: Ability to maintain clinical measurements within normal limits will improve Outcome: Progressing Goal: Will remain free from infection Outcome: Progressing Goal: Diagnostic test results will improve Outcome: Progressing Goal: Respiratory complications will improve Outcome: Progressing Goal: Cardiovascular complication will be avoided Outcome: Progressing   Problem: Activity: Goal: Risk for activity intolerance will decrease Outcome: Progressing   Problem: Nutrition: Goal: Adequate nutrition will be maintained Outcome: Progressing   Problem: Coping: Goal: Level of anxiety will decrease Outcome: Progressing   Problem: Elimination: Goal: Will not experience complications related to bowel motility Outcome: Progressing Goal: Will not experience complications related to urinary retention Outcome: Progressing   Problem: Pain Managment: Goal: General experience of comfort will improve and/or be controlled Outcome: Progressing   Problem: Safety: Goal: Ability to remain free from injury will improve Outcome: Progressing   Problem: Skin Integrity: Goal: Risk for impaired skin integrity will decrease Outcome: Progressing   Problem: Cardiac: Goal: Ability to achieve and maintain adequate cardiopulmonary perfusion will improve Outcome: Progressing Goal: Vascular access site(s) Level 0-1 will be maintained Outcome: Progressing   Problem: Fluid Volume: Goal: Ability to achieve a balanced intake and output will improve Outcome: Progressing   Problem: Physical Regulation: Goal: Complications related  to the disease process, condition or treatment will be avoided or minimized Outcome: Progressing   Problem: Respiratory: Goal: Will regain and/or maintain adequate ventilation Outcome: Progressing

## 2023-06-29 NOTE — Progress Notes (Signed)
 NAME:  Vicki [name], MRN:  [id number], DOB:  16-Aug-1950, LOS: 2 ADMISSION DATE:  06/27/2023, CONSULTATION DATE:  06/27/23 REFERRING MD:  Bensimhon, CHIEF COMPLAINT:  acute RV failure   History of Present Illness:  Vicki [name] is a 73 y/o woman with a history of HTN, HLD, former tobacco abuse who presented with CP x 3 days that radiated to the jaw and back. She had associated nausea w/ vomiting, lightheadedness, weakness, and diaphoresis. She had these symptoms evaluated at [facility] on 6/8 but refused to go to the ED for further evaluation despite this recommendation. She was diagnosed with atrial fibrillation during that visit. Today she presented with worsening og SOB, dizziness, vomiting, CP. She took [place] powder x 2 at home prior to presentation. The past few days she has not been able to keep anything down and despite taking her BP meds, she has vomited them up. In the ED she was noted to be in Afib with HR 30-40s and hypotension. CTA did not demonstrate PE. Cardiology was consulted and she was evaluated with L&R heart catherization, found to have acute RV failure with low CO and no obvious acute coronary blockage, but her RCA was not able to be seen angiographically. She had an RP Impella placed for R-sided MCS. PCCM was consulted for ICU management.   Pertinent  Medical History  Stroke Seizures PTSD HTN HLD GERD FM  Significant Hospital Events: Including procedures, antibiotic start and stop dates in addition to other pertinent events   6/10 admitted, LHC, RHC, RP impella placed 6/11 remains bradycardic in 40's, intermittent idioventricular rhythm, Impella P-7 with flows 3.2L and with NE at 4: CVP 11, CO 2.7, CI 1.5.   EKG 6/8: Afib, QS waves in inferior leads with TWI, R waves in V1. CT chest 6/10>  mild nonspecific GGO throughout both lungs, no architectural distortion. No PE. Dilated IVC with dilated intrahepatic veins. Descending aortic plaque, small intramural hematoma. Slightly  abnormal GB.   Interim History / Subjective:  Improving nausea, no vomiting or CP Remains junctional in 50's, no further idioventricular arrhythmia since around midnight   NE 20, dobutamine 5 Coox stable 61 Impella P8 with flows ~3.2, CVP 11, CO 5, CI 2.8, SVR 1199, PA 39/24 ( 29) afebrile  Objective    Blood pressure 108/71, pulse (!) 50, temperature 98.8 F (37.1 C), resp. rate 11, height 5' 3 (1.6 m), weight 76.2 kg, last menstrual period 07/18/2011, SpO2 97%. PAP: (21-325)/(11-318) 35/22 CVP:  [2 mmHg-26 mmHg] 10 mmHg CO:  [3 L/min-5 L/min] 5 L/min CI:  [1.7 L/min/m2-2.9 L/min/m2] 2.9 L/min/m2      Intake/Output Summary (Last 24 hours) at 06/29/2023 0950 Last data filed at 06/29/2023 0900 Gross per 24 hour  Intake 2185.79 ml  Output 1450 ml  Net 735.79 ml   Filed Weights   06/27/23 0958 06/28/23 0615 06/29/23 0645  Weight: 72.6 kg 75.2 kg 76.2 kg    Examination: General:  very pleasant older female sitting upright in bed in NAD HEENT: MM pink/moist, pupils reactive, anicteric  Neuro: Aox4, non focal CV:  junctional in 50s, mechanical sounds, palpable pulses, impella R internal jugular, R radial aline- non functional  PULM:  non labored, clear anteriorly GI: soft, bs+, NT, foley  Extremities: warm/dry, no pitting LE edema  Skin: no rashes   UOP 1.4+ 1 unmeasured  No BM yet Wt 75.2> 76.2kg Afebrile Labs> Na 127> 129, K 3.8, BUN/ sCr 14/ 0.77, AST/ ALT improving, t. Bili 0.8> 1.5, LDH  498> 547, WBC 15.1> 12.3, H/H 11.7/ 34> 10.6/ 31, plts 118> 73 ABG 7.431/ 33/ 80/ 22.6, iCa 1.16   Assessment and Plan   Cardiogenic shock due to acute RV failure NSTEMI with RV infarct   Symptomatic junctional bradycardia Atrial fibrillation with slow ventricular response - impella per AHF, weaned from P8 to P7, hopefully be able to wean further tomorrow.  - pressors/ intropes per AHF> cont DB 5, NE, adding vasopressin to wean but will need to monitor HR.  At risk for TVP -  cont to trend LDH, PA hemodynamics, coox, and CVP closely  - heparin  per pharmacy - aline replaced today as radial non-functional  - cont ASA, no BB with shock, cont to hold pta antihypertensives  - diureses today - optimize electrolytes, Mag> 2, K > 4> K replacement today   Nausea and vomiting due to acute RV failure - improved - cont clear liquids for now, nausea improving - zofran  PRN  H/o insomnia -con't PTA zolpidem  PRN  Hyponatremia, hypervolemic AKI, resolved  - diureses as above, sCr stable, monitor closely  - trend renal indices  - strict I/Os, daily wts - avoid nephrotoxins, renal dose meds, hemodynamic support as above  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 - RV support as above - trend LFTs  Remote h/o tobacco abuse - recommend lung cancer screening referral at discharge  Hypothyroidism - con't PTA synthroid  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, seen by VVS, 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  Best Practice (right click and Reselect all SmartList Selections daily)   Diet/type: clear liquids DVT prophylaxis systemic heparin  Pressure ulcer(s): pressure ulcer assessment deferred -- see RN assessment GI prophylaxis: N/A Lines: Central line and Arterial Line Foley:  N/A Code Status:  full code Last date of multidisciplinary goals of care discussion [patient updated 6/10]  Husband at bedside, updated 6/12   Critical care time: 35 min     Vicki Glisson, MSN, AG-ACNP-BC [facility] 06/29/2023, 9:50 AM  See Amion for pager If no response to pager , please call [phone] until 7pm After 7:00 pm call Elink  [phone]

## 2023-06-29 NOTE — Procedures (Signed)
 Arterial Catheter Insertion Procedure Note  Vicki [name]  [id number]  1950-08-30  Date:06/29/23  Time:10:39 AM    Provider Performing: Early Glisson    Procedure: Insertion of Arterial Line (82956) with US  guidance (21308)   Indication(s) Blood pressure monitoring and/or need for frequent ABGs  Consent Risks of the procedure as well as the alternatives and risks of each were explained to the patient and/or caregiver.  Consent for the procedure was obtained and is signed in the bedside chart  Anesthesia None   Time Out Verified patient identification, verified procedure, site/side was marked, verified correct patient position, special equipment/implants available, medications/allergies/relevant history reviewed, required imaging and test results available.   Sterile Technique Maximal sterile technique including full sterile barrier drape, hand hygiene, sterile gown, sterile gloves, mask, hair covering, sterile ultrasound probe cover (if used).   Procedure Description Area of catheter insertion was cleaned with chlorhexidine  and draped in sterile fashion. With real-time ultrasound guidance an arterial catheter was placed into the right axillary artery.  Appropriate arterial tracings confirmed on monitor.  Line sutured.  Biopatch and sterile dressing applied.   Complications/Tolerance None; patient tolerated the procedure well.   EBL N/a   Specimen(s) None     Early Glisson, MSN, AG-ACNP-BC [facility] 06/29/2023, 10:40 AM  See Amion for pager If no response to pager , please call [phone] until 7pm After 7:00 pm call Elink  [phone]

## 2023-06-29 NOTE — Progress Notes (Addendum)
 Advanced Heart Failure Rounding Note   Subjective:    Remains on Impella at P-7. Flow 3.2L   On DBA 5 + NE 18.   CVP 11 PAP 39-24 FICK CI 2.1 Co-ox 61% SVR 1199  SCr 0.77  1.5L yesterday   On heparin  gtt  Plts 118>>73K. Hgb 10.2. no gross bleeding.  LDH 264>>498 547   Main complaint is nausea this morning. No vomiting. Denies dyspnea. No current CP.   Objective:   Weight Range:  Vital Signs:   Temp:  [88 F (31.1 C)-99.5 F (37.5 C)] 99 F (37.2 C) (06/12 1043) Pulse Rate:  [48-89] 56 (06/12 1129) Resp:  [8-33] 19 (06/12 1129) BP: (78-160)/(42-123) 160/114 (06/12 1046) SpO2:  [90 %-100 %] 96 % (06/12 1129) Arterial Line BP: (21-228)/(-16-224) 154/63 (06/12 1129) Weight:  [76.2 kg] 76.2 kg (06/12 0645) Last BM Date :  (PTA)  Weight change: Filed Weights   06/27/23 0958 06/28/23 0615 06/29/23 0645  Weight: 72.6 kg 75.2 kg 76.2 kg    Intake/Output:   Intake/Output Summary (Last 24 hours) at 06/29/2023 1157 Last data filed at 06/29/2023 1100 Gross per 24 hour  Intake 2352.22 ml  Output 2200 ml  Net 152.22 ml     PHYSICAL EXAM: General:  fatigued appearing. No respiratory difficulty HEENT: normal Neck: supple. JVD 12 cm, +RIJ impella, + LIJ swan  Cor: PMI nondisplaced. Regular rate & rhythm.  Lungs: clear  Abdomen: soft, nontender, nondistended. No hepatosplenomegaly. No bruits or masses. Good bowel sounds. Extremities: no cyanosis, clubbing, rash, trace b/l LE edema Neuro: alert & oriented x 3, cranial nerves grossly intact. moves all 4 extremities w/o difficulty. Affect pleasant.  Telemetry: SB w/ periods of junctional bradycardia in the 40s. Personally reviewed   Labs: Basic Metabolic Panel: Recent Labs  Lab 06/27/23 1020 06/27/23 1034 06/27/23 1231 06/27/23 2232 06/28/23 0444 06/28/23 0455 06/28/23 0808 06/29/23 0601 06/29/23 0609  NA 134* 132*  133*   < > 129* 129* 126* 127* 129* 128*  K 4.1 4.0  4.0   < > 4.2 4.4 4.3 4.3 3.8  3.9  CL 99 100  --  99  --  97*  --  97*  --   CO2 19*  --   --  22  --  21*  --  23  --   GLUCOSE 110* 109*  --  118*  --  135*  --  149*  --   BUN 31* 32*  --  29*  --  26*  --  14  --   CREATININE 1.49* 1.40*  --  1.18*  --  1.06*  --  0.77  --   CALCIUM 8.6*  --   --  8.1*  --  7.8*  --  8.0*  --   MG  --   --   --  1.6*  --   --   --  2.1  --    < > = values in this interval not displayed.    Liver Function Tests: Recent Labs  Lab 06/27/23 1020 06/27/23 2232 06/29/23 0601  AST 123* 131* 97*  ALT 123* 124* 109*  ALKPHOS 62 57 47  BILITOT 0.8 0.8 1.5*  PROT 5.3* 5.3* 5.2*  ALBUMIN 3.0* 3.0* 2.7*   No results for input(s): LIPASE, AMYLASE in the last 168 hours. No results for input(s): AMMONIA in the last 168 hours.  CBC: Recent Labs  Lab 06/27/23 1020 06/27/23 1034 06/27/23 1629 06/27/23 1700 06/28/23  1610 06/28/23 0455 06/28/23 0808 06/29/23 0601 06/29/23 0609  WBC 15.6*  --  21.0*  --   --  15.1*  --  12.3*  --   NEUTROABS 11.7*  --  16.6*  --   --   --   --   --   --   HGB 13.1   < > 13.3   < > 11.6* 11.7* 11.2* 10.6* 10.2*  HCT 39.8   < > 38.4   < > 34.0* 34.5* 33.0* 31.4* 30.0*  MCV 97.3  --  94.6  --   --  94.0  --  94.3  --   PLT 153  --  168  --   --  118*  --  73*  --    < > = values in this interval not displayed.    Cardiac Enzymes: No results for input(s): CKTOTAL, CKMB, CKMBINDEX, TROPONINI in the last 168 hours.  BNP: BNP (last 3 results) Recent Labs    06/27/23 1020  BNP 543.0*    ProBNP (last 3 results) No results for input(s): PROBNP in the last 8760 hours.    Other results:  Imaging: DG Chest Port 1 View Result Date: 06/29/2023 CLINICAL DATA:  Central line placement. EXAM: PORTABLE CHEST 1 VIEW COMPARISON:  06/28/2023 FINDINGS: Similar low lung volumes with asymmetric elevation right hemidiaphragm. The cardio pericardial silhouette is enlarged. There is pulmonary vascular congestion without overt pulmonary edema.  Basilar atelectasis with probable small left pleural effusion. Left IJ pulmonary catheter tip is in the main pulmonary artery. Stable position of the Impella device overlying the main pulmonary artery. Left chest tube again noted without evidence for left-sided pneumothorax. IMPRESSION: 1. Left IJ pulmonary catheter tip is in the main pulmonary artery. Stable position of the Impella device with tip overlying the main pulmonary artery. 2. Low lung volumes with basilar atelectasis and probable small left pleural effusion. Electronically Signed   By: Donnal Fusi M.D.   On: 06/29/2023 07:29   DG Chest Port 1 View Result Date: 06/28/2023 CLINICAL DATA:  Central line placement EXAM: PORTABLE CHEST 1 VIEW COMPARISON:  June 28, 2023, 5:16 a.m. FINDINGS: Tip of the Swan-Ganz in the right main proximal pulmonary artery. The tip of the Impella catheter device is in the outflow portion of the main pulmonary artery. Left chest tube. No pneumothorax No congestive changes No significant pleural effusions IMPRESSION: *No acute cardiopulmonary process. *Support devices as above. Electronically Signed   By: Fredrich Jefferson M.D.   On: 06/28/2023 09:24   DG CHEST PORT 1 VIEW Result Date: 06/28/2023 CLINICAL DATA:  [id number].  Right ventricular assist device present. EXAM: PORTABLE CHEST 1 VIEW COMPARISON:  Portable chest yesterday at 4:33 p.m. FINDINGS: 5:16 a.m. Left IJ approach catheter again terminates in the right lung base probably in a lower lobe branch. Right IJ approach Impella device as before terminates in the main pulmonary artery. The cardiomediastinal silhouette and vascular pattern are normal. There is calcification of the transverse aorta. There is increased linear atelectasis in the left base. The lungs are otherwise clear. There is no substantial pleural effusion. Overall aeration is otherwise unchanged. IMPRESSION: 1. Increased linear atelectasis in the left base. No other interval change. 2. Left IJ approach  catheter again terminates in the right lung base probably in a lower lobe branch. 3. Right IJ approach Impella device as before terminates in the main pulmonary artery. Electronically Signed   By: Denman Fischer M.D.   On: 06/28/2023 05:56  CARDIAC CATHETERIZATION Result Date: 06/27/2023   The left ventricular ejection fraction is greater than 65% by visual estimate. Findings: On NE 15 Ao = 123/68(93) LV = 97/19 RA = 18 RV = 35/17 PA =  35/21 (26) PCW = 19 Fick cardiac output/index = 2.3/1.3 Thermodilution CO/CI = 2.9/1.6 PVR = 0.8 WU Ao sat = 96% PA sat = 37%, 41% PAPi = 0.78 Assessment: 1. Left dominant coronary system with normal LM, LAD and LCx. Small non-dominant RCA not visualized (previously seen in 2017) 2. LVEF 65% 3. Evidence of severe RV failure with profound shock requiring placement of Impella RP support device Jules Oar, MD 11:44 PM  US  Abdomen Limited RUQ (LIVER/GB) Result Date: 06/27/2023 CLINICAL DATA:  Prominent gallbladder on recent CT examination EXAM: ULTRASOUND ABDOMEN LIMITED RIGHT UPPER QUADRANT COMPARISON:  CT from earlier in the same day. FINDINGS: Gallbladder: No gallstones or wall thickening visualized. No sonographic Murphy sign noted by sonographer. Common bile duct: Diameter: 2.7 mm Liver: Mildly increased in echogenicity consistent with fatty infiltration. Portal vein is patent on color Doppler imaging with normal direction of blood flow towards the liver. Other: None. IMPRESSION: Fatty liver. No acute abnormality in the gallbladder. Electronically Signed   By: Violeta Grey M.D.   On: 06/27/2023 21:45   ECHOCARDIOGRAM LIMITED Result Date: 06/27/2023    ECHOCARDIOGRAM LIMITED REPORT   Patient Name:   Vicki [name] Date of Exam: 06/27/2023 Medical Rec #:  [id number]       Height:       63.0 in Accession #:    [id number]      Weight:       160.0 lb Date of Birth:  [birth year]       BSA:          1.759 m Patient Age:    73 years        BP:           142/102 mmHg Patient  Gender: F               HR:           72 bpm. Exam Location:  Inpatient Procedure: Limited Echo (Both Spectral and Color Flow Doppler were utilized            during procedure). Indications:    I50.9* Heart failure (unspecified)  History:        Patient has prior history of Echocardiogram examinations, most                 recent 06/27/2023. CHF; CAD.  Sonographer:    Meagan Baucom RDCS, FE, PE Sonographer#2:  Raynelle Callow RDCS Referring Phys: [id number] ADITYA Bruce Caper  Sonographer Comments: Technically difficult study due to poor echo windows. Image acquisition challenging due to breast implants. Mclean present. Impella device in PA. IMPRESSIONS  1. Left ventricular ejection fraction, by estimation, is 60 to 65%. The left ventricle has normal function. The left ventricle has no regional wall motion abnormalities.  2. RV strain attempted- unable to analyze form subcostal     RV Impella device noted.     Inlet port visualized at IVC/RA junction     Cannula traverses tricuspid valve along the RV free wall, no entanglement     Outlet port visualized in main PA     Tricuspid regurgitation not assessed. Right ventricular systolic function is moderately reduced.  3. The inferior vena cava is normal in size with <50% respiratory variability, suggesting right atrial pressure of 8 mmHg.  FINDINGS  Left Ventricle: Left ventricular ejection fraction, by estimation, is 60 to 65%. The left ventricle has normal function. The left ventricle has no regional wall motion abnormalities. Right Ventricle: RV strain attempted- unable to analyze form subcostal RV Impella device noted. Inlet port visualized at IVC/RA junction Cannula traverses tricuspid valve along the RV free wall, no entanglement Outlet port visualized in main PA Tricuspid regurgitation not assessed. Right ventricular systolic function is moderately reduced. Pulmonary Artery: The pulmonary artery is of normal size. Venous: The inferior vena cava is normal in size with less  than 50% respiratory variability, suggesting right atrial pressure of 8 mmHg. Gloriann Larger MD Electronically signed by Gloriann Larger MD Signature Date/Time: 06/27/2023/5:51:13 PM    Final    DG CHEST PORT 1 VIEW Result Date: 06/27/2023 CLINICAL DATA:  [id number] Central venous catheter in place, temporary [id number] EXAM: PORTABLE CHEST - 1 VIEW COMPARISON:  June 27, 2023, 10:34 a.m. FINDINGS: Elevation of the right hemidiaphragm. No focal airspace consolidation, pleural effusion, or pneumothorax. No cardiomegaly. Right IJ approach Impella device, terminating in the region of the main pulmonary artery. Left IJ approach introducer sheath with a pulmonary artery catheter, which terminates in the right lung base. IMPRESSION: 1. Right IJ approach Impella device terminating in the region the main pulmonary artery. 2. Left IJ approach pulmonary artery catheter terminating in the right lung base. 3. No pneumonia or pulmonary edema. Electronically Signed   By: Rance Burrows M.D.   On: 06/27/2023 16:51   ECHOCARDIOGRAM COMPLETE Result Date: 06/27/2023    ECHOCARDIOGRAM REPORT   Patient Name:   Vicki [name] Date of Exam: 06/27/2023 Medical Rec #:  [id number]       Height:       63.0 in Accession #:    [id number]      Weight:       160.0 lb Date of Birth:  04/20/1950       BSA:          1.759 m Patient Age:    73 years        BP:           69/45 mmHg Patient Gender: F               HR:           45 bpm. Exam Location:  Inpatient Procedure: 2D Echo, Cardiac Doppler and Color Doppler (Both Spectral and Color            Flow Doppler were utilized during procedure). Indications:    Shock  History:        Patient has no prior history of Echocardiogram examinations.  Sonographer:    Astrid Blamer Referring Phys: [id number] BRITTAINY M SIMMONS IMPRESSIONS  1. Left ventricular ejection fraction, by estimation, is 60 to 65%. The left ventricle has normal function. Left ventricular endocardial border not optimally defined to  evaluate regional wall motion. Left ventricular diastolic function could not be evaluated.  2. 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 Right ventricular systolic function is mildly reduced. The right ventricular size is normal.  3. The mitral valve is grossly normal. No evidence of mitral valve regurgitation.  4. The aortic valve was not well visualized. Aortic valve regurgitation is not visualized.  5. The inferior vena cava is dilated in size with <50% respiratory variability, suggesting right atrial pressure of 15 mmHg.  6. This is a limited study. This study does not evaluated all parameters  for restrictive cardiomyopathy. Consider repeat study in the future if clinically indicated; at that time TDI would be usefuly. Comparison(s): No prior Echocardiogram. FINDINGS  Left Ventricle: Left ventricular ejection fraction, by estimation, is 60 to 65%. The left ventricle has normal function. Left ventricular endocardial border not optimally defined to evaluate regional wall motion. The left ventricular internal cavity size was normal in size. Suboptimal image quality limits for assessment of left ventricular hypertrophy. Left ventricular diastolic function could not be evaluated. 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 The right ventricular size is normal. No increase in right ventricular wall thickness. Right ventricular systolic function is mildly reduced. Left Atrium: Left atrial size was not well visualized. Right Atrium: Right atrial size was not well visualized. Pericardium: Pericardium appears thick but is not well visualized. This is best seen in clip 28. There is no evidence of pericardial effusion. The pericardial effusion appears to contain mixed echogenic material. Thickening/calcification of pericardium present. Mitral Valve: The mitral valve is grossly normal. No evidence of mitral valve regurgitation.  Tricuspid Valve: The tricuspid valve is not well visualized. Tricuspid valve regurgitation is not demonstrated. Aortic Valve: The aortic valve was not well visualized. Aortic valve regurgitation is not visualized. Pulmonic Valve: The pulmonic valve was not well visualized. Pulmonic valve regurgitation is not visualized. Aorta: The ascending aorta was not well visualized and the aortic arch was not well visualized. Venous: The inferior vena cava is dilated in size with less than 50% respiratory variability, suggesting right atrial pressure of 15 mmHg. IAS/Shunts: The interatrial septum was not well visualized. Additional Comments: A venous catheter is visualized in the right atrium. Gloriann Larger MD Electronically signed by Gloriann Larger MD Signature Date/Time: 06/27/2023/2:20:32 PM    Final      Medications:     Scheduled Medications:  acetaminophen   650 mg Oral Q8H   aspirin   81 mg Oral Daily   Chlorhexidine  Gluconate Cloth  6 each Topical Daily   levothyroxine  50 mcg Oral Daily   lidocaine   1 patch Transdermal Q24H   polyethylene glycol  17 g Oral Daily   sodium chloride  flush  3 mL Intravenous Q12H    Infusions:  sodium chloride  Stopped (06/29/23 1048)   DOBUTamine 5 mcg/kg/min (06/29/23 1100)   heparin  1,100 Units/hr (06/29/23 1100)   norepinephrine (LEVOPHED) Adult infusion 16 mcg/min (06/29/23 1100)   potassium chloride  10 mEq (06/29/23 1153)   promethazine  (PHENERGAN ) injection (IM or IVPB) Stopped (06/29/23 0658)   sodium bicarbonate 25 mEq (Impella PURGE) in dextrose  5 % 1000 mL bag     vasopressin Stopped (06/29/23 1005)    PRN Medications: sodium chloride , sodium chloride , HYDROmorphone  (DILAUDID ) injection, ondansetron  (ZOFRAN ) IV, oxyCODONE , promethazine  (PHENERGAN ) injection (IM or IVPB), sodium chloride  flush, zolpidem    Assessment/Plan:   1. Cardiogenic Shock, 2/2 Profound RV Failure   - cath 06/27/23 normal left-dominant system with non-visualized tiny  RCA - CT no PE - Echo LVEF 60-65% RV moderate HK with McConnell's sign - s/p Impella RP placement, on P-7. CI 2.1, Co-ox 61%, SVR 1199, CVP 11   - Remains on NE 18 + DBA 5 - Start VP 0.3. Wean NE as tolerated. Continue DBA at 5  - Hold diuretics for now    2. NSTEMI with RV infarct - plan as above - continue ASA/statin - No b-blocker with shock  3. Junctional rhythm with pauses - improving w/ DBA - continue to monitor, may need TVP  4. 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 Shock Liver - in setting of RV failure  - continue supportive care   6. AKI  - Initial SCr 1.4 due to ATN/shock - now resolved   7. Hyponatremia - due to HF.  - restrict FW    Length of Stay: 2   Brittainy Simmons PA-C  06/29/2023, 11:57 AM  Advanced Heart Failure Team Pager [phone] (M-F; 7a - 4p)  Please contact [facility] for night-coverage after hours (4p -7a ) and weekends on [email or web address]  Agree with above  Remains with RP-Impella support at P-8. Flow 3.2L/M  Also on NE 18 + DBA 5  Hemodynamics reviewed personally  Denies CP or SOB. Neck sore  POCUS ECHO done personally LVEF 60% RV mild HK   Heart block improving  General:  Sitting up in bed  No resp difficulty HEENT: normal Neck: supple. RIJ impella LIJ swan Cor:  Regular rate & rhythm. No rubs, gallops or murmurs. Lungs: clear Abdomen: soft, nontender, nondistended. No hepatosplenomegaly. No bruits or masses. Good bowel sounds. Extremities: no cyanosis, clubbing, rash, tr edema Neuro: alert & orientedx3, cranial nerves grossly intact. moves all 4 extremities w/o difficulty. Affect pleasant  She is recovering slowly from her RV infarct with RP-Impella support. That said still on significant dose of pressors. Remains on bival. LDH trending up and PLTs trending down.    Will Add VP and try to wean NE. Mobilize as tolerated. Hopefully can wean Impella tomorrow  D/w CCM at bedside.   CRITICAL CARE Performed by: Jules Oar  Total critical care time: 45 minutes  Critical care time was exclusive of separately billable procedures and treating other patients.  Critical care was necessary to treat or prevent imminent or life-threatening deterioration.  Critical care was time spent personally by me (independent of midlevel providers or residents) on the following activities: development of treatment plan with patient and/or surrogate as well as nursing, discussions with consultants, evaluation of patient's response to treatment, examination of patient, obtaining history from patient or surrogate, ordering and performing treatments and interventions, ordering and review of laboratory studies, ordering and review of radiographic studies, pulse oximetry and re-evaluation of patient's condition.  Jules Oar, MD  12:22 PM

## 2023-06-29 NOTE — Progress Notes (Signed)
 ANTICOAGULATION CONSULT NOTE  Pharmacy Consult for heparin  Indication: Impella RP  Allergies  Allergen Reactions   Codeine Other (See Comments)    headache   Crestor [Rosuvastatin] Other (See Comments)    Muscle aches   Other Nausea And Vomiting    Anesthesia-severe vomiting   Simvastatin Other (See Comments)    Muscle aches   Egg White (Egg Protein)     Other Reaction(s): Throat closes   Ezetimibe      Other Reaction(s): crippling muscle aches   Hydrocodone-Acetaminophen      Other Reaction(s): migraines   Peanut (Diagnostic)     Other Reaction(s): itchy   Shellfish Allergy Other (See Comments)    Throat swelling   Statins     Patient Measurements: Height: 5' 3 (160 cm) Weight: 76.2 kg (167 lb 15.9 oz) IBW/kg (Calculated) : 52.4 Heparin  Dosing Weight: 68 kg   Vital Signs: Temp: 98.8 F (37.1 C) (06/12 1231) Temp Source: Core (06/12 0400) BP: 160/114 (06/12 1046) Pulse Rate: 80 (06/12 1231)  Labs: Recent Labs    06/27/23 1629 06/27/23 1700 06/27/23 2232 06/28/23 0101 06/28/23 0455 06/28/23 0730 06/28/23 0800 06/28/23 0808 06/28/23 1017 06/28/23 1721 06/29/23 0324 06/29/23 0601 06/29/23 0609 06/29/23 1159  HGB 13.3   < >  --    < > 11.7*  --   --  11.2*  --   --   --  10.6* 10.2*  --   HCT 38.4   < >  --    < > 34.5*  --   --  33.0*  --   --   --  31.4* 30.0*  --   PLT 168  --   --   --  118*  --   --   --   --   --   --  73*  --   --   HEPARINUNFRC  --   --   --    < >  --   --    < >  --   --  <0.10* 0.11*  --   --  <0.10*  CREATININE  --   --  1.18*  --  1.06*  --   --   --   --   --   --  0.77  --   --   TROPONINIHS 6,049*  --   --   --   --  4,251*  --   --  3,754*  --   --   --   --   --    < > = values in this interval not displayed.    Estimated Creatinine Clearance: 62.1 mL/min (by C-G formula based on SCr of 0.77 mg/dL).  Medical History: Past Medical History:  Diagnosis Date   Alcohol intoxication (HCC) 05/2014    ED note    Anxiety     Arthritis    Boils    Chronic back pain    Community acquired pneumonia 01/05/2016   Depression    Fibromyalgia    GERD (gastroesophageal reflux disease)    Heart attack (HCC)    Hepatic cyst 01/06/2016   Hyperlipidemia    Hypertension    Insomnia    Leg cramps    Migraines    with aura   Mini stroke 05/2014   with paralysis for 1 hr   Osteopenia    PONV (postoperative nausea and vomiting)    severe   Postmenopausal bleeding 12/2015   PTSD (post-traumatic stress disorder)  S/P epidural steroid injection    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  Not on anticoagulation prior to admission.  Pharmacy consulted for heparin  dosing.  -Chest CT w/ Type B dissection (small ulcerated plaque/developing intramural hematoma ~8 mm x 14 mm)  6/12 heparin  level <0.10, undetectable with heparin  infusion at 1100 units/h. Hgb 10.2, plt 73 low, down trending. No issues or signs of bleeding reported by RN. Decreased Impella performance from P8 to P7 today.   Goal of Therapy:  Heparin  level 0.3-0.5 units/ml Monitor platelets by anticoagulation protocol: Yes   Plan:  Continue bicarb Impella purge Increase heparin  infusion to 1250 units/hr  8h heparin  level Daily CBC, heparin  levels  Monitor for signs/symptoms of bleeding  Jerrel Mor, PharmD PGY1 Pharmacy Resident 06/29/2023 1:13 PM

## 2023-06-30 ENCOUNTER — Inpatient Hospital Stay (HOSPITAL_COMMUNITY)

## 2023-06-30 DIAGNOSIS — I214 Non-ST elevation (NSTEMI) myocardial infarction: Secondary | ICD-10-CM | POA: Diagnosis not present

## 2023-06-30 DIAGNOSIS — R570 Cardiogenic shock: Secondary | ICD-10-CM | POA: Diagnosis not present

## 2023-06-30 DIAGNOSIS — D696 Thrombocytopenia, unspecified: Secondary | ICD-10-CM | POA: Diagnosis not present

## 2023-06-30 LAB — CBC
HCT: 27.6 % — ABNORMAL LOW (ref 36.0–46.0)
HCT: 26.8 % — ABNORMAL LOW (ref 36.0–46.0)
Hemoglobin: 9.3 g/dL — ABNORMAL LOW (ref 12.0–15.0)
Hemoglobin: 9.2 g/dL — ABNORMAL LOW (ref 12.0–15.0)
MCH: 32.4 pg (ref 26.0–34.0)
MCH: 32.5 pg (ref 26.0–34.0)
MCHC: 33.7 g/dL (ref 30.0–36.0)
MCHC: 34.3 g/dL (ref 30.0–36.0)
MCV: 96.2 fL (ref 80.0–100.0)
MCV: 94.7 fL (ref 80.0–100.0)
Platelets: 56 10*3/uL — ABNORMAL LOW (ref 150–400)
Platelets: 65 10*3/uL — ABNORMAL LOW (ref 150–400)
RBC: 2.87 MIL/uL — ABNORMAL LOW (ref 3.87–5.11)
RBC: 2.83 MIL/uL — ABNORMAL LOW (ref 3.87–5.11)
RDW: 13.1 % (ref 11.5–15.5)
RDW: 13 % (ref 11.5–15.5)
WBC: 8.5 10*3/uL (ref 4.0–10.5)
WBC: 8.4 10*3/uL (ref 4.0–10.5)
nRBC: 0 % (ref 0.0–0.2)
nRBC: 0 % (ref 0.0–0.2)

## 2023-06-30 LAB — HEPATIC FUNCTION PANEL
ALT: 85 U/L — ABNORMAL HIGH (ref 0–44)
AST: 54 U/L — ABNORMAL HIGH (ref 15–41)
Albumin: 2.4 g/dL — ABNORMAL LOW (ref 3.5–5.0)
Alkaline Phosphatase: 52 U/L (ref 38–126)
Bilirubin, Direct: 0.4 mg/dL — ABNORMAL HIGH (ref 0.0–0.2)
Indirect Bilirubin: 0.8 mg/dL (ref 0.3–0.9)
Total Bilirubin: 1.2 mg/dL (ref 0.0–1.2)
Total Protein: 4.9 g/dL — ABNORMAL LOW (ref 6.5–8.1)

## 2023-06-30 LAB — BASIC METABOLIC PANEL WITH GFR
Anion gap: 6 (ref 5–15)
BUN: 12 mg/dL (ref 8–23)
CO2: 23 mmol/L (ref 22–32)
Calcium: 8 mg/dL — ABNORMAL LOW (ref 8.9–10.3)
Chloride: 96 mmol/L — ABNORMAL LOW (ref 98–111)
Creatinine, Ser: 0.59 mg/dL (ref 0.44–1.00)
GFR, Estimated: >60 mL/min (ref >60)
Glucose, Bld: 120 mg/dL — ABNORMAL HIGH (ref 70–99)
Potassium: 4.7 mmol/L (ref 3.5–5.1)
Sodium: 125 mmol/L — ABNORMAL LOW (ref 135–145)

## 2023-06-30 LAB — POCT I-STAT 7, (LYTES, BLD GAS, ICA,H+H)
Acid-base deficit: 4 mmol/L — ABNORMAL HIGH (ref 0.0–2.0)
Bicarbonate: 21.5 mmol/L (ref 20.0–28.0)
Calcium, Ion: 1.17 mmol/L (ref 1.15–1.40)
HCT: 24 % — ABNORMAL LOW (ref 36.0–46.0)
Hemoglobin: 8.2 g/dL — ABNORMAL LOW (ref 12.0–15.0)
O2 Saturation: 96 %
Patient temperature: 37
Potassium: 4.4 mmol/L (ref 3.5–5.1)
Sodium: 127 mmol/L — ABNORMAL LOW (ref 135–145)
TCO2: 23 mmol/L (ref 22–32)
pCO2 arterial: 40.7 mmHg (ref 32–48)
pH, Arterial: 7.331 — ABNORMAL LOW (ref 7.35–7.45)
pO2, Arterial: 91 mmHg (ref 83–108)

## 2023-06-30 LAB — COOXEMETRY PANEL
Carboxyhemoglobin: 1.3 % (ref 0.5–1.5)
Carboxyhemoglobin: 2.1 % — ABNORMAL HIGH (ref 0.5–1.5)
Carboxyhemoglobin: 1.4 % (ref 0.5–1.5)
Carboxyhemoglobin: 1.9 % — ABNORMAL HIGH (ref 0.5–1.5)
Carboxyhemoglobin: 1.7 % — ABNORMAL HIGH (ref 0.5–1.5)
Methemoglobin: <0.7 % (ref 0.0–1.5)
Methemoglobin: <0.7 % (ref 0.0–1.5)
Methemoglobin: 1 % (ref 0.0–1.5)
Methemoglobin: 1.3 % (ref 0.0–1.5)
Methemoglobin: 1 % (ref 0.0–1.5)
O2 Saturation: 48.5 %
O2 Saturation: 55.1 %
O2 Saturation: 49.8 %
O2 Saturation: 52.1 %
O2 Saturation: 56.2 %
Total hemoglobin: 8.9 g/dL — ABNORMAL LOW (ref 12.0–16.0)
Total hemoglobin: 8.5 g/dL — ABNORMAL LOW (ref 12.0–16.0)
Total hemoglobin: 9.6 g/dL — ABNORMAL LOW (ref 12.0–16.0)
Total hemoglobin: 9.3 g/dL — ABNORMAL LOW (ref 12.0–16.0)
Total hemoglobin: 9.3 g/dL — ABNORMAL LOW (ref 12.0–16.0)

## 2023-06-30 LAB — APTT
aPTT: 82 s — ABNORMAL HIGH (ref 24–36)
aPTT: 75 s — ABNORMAL HIGH (ref 24–36)

## 2023-06-30 LAB — MAGNESIUM: Magnesium: 2 mg/dL (ref 1.7–2.4)

## 2023-06-30 LAB — LACTATE DEHYDROGENASE: LDH: 444 U/L — ABNORMAL HIGH (ref 98–192)

## 2023-06-30 LAB — HEPARIN LEVEL (UNFRACTIONATED): Heparin Unfractionated: 0.24 [IU]/mL — ABNORMAL LOW (ref 0.30–0.70)

## 2023-06-30 MED ORDER — POLYETHYLENE GLYCOL 3350 17 G PO PACK
17.0000 g | PACK | Freq: Two times a day (BID) | ORAL | Status: DC
  Administered 2023-07-01 – 2023-07-10 (×7): 17 g via ORAL
  Filled 2023-06-30 (×7): qty 1

## 2023-06-30 MED ORDER — ACETAMINOPHEN 325 MG PO TABS
650.0000 mg | ORAL_TABLET | Freq: Three times a day (TID) | ORAL | Status: DC | PRN
  Administered 2023-07-03 – 2023-07-13 (×4): 650 mg via ORAL
  Filled 2023-06-30 (×4): qty 2

## 2023-06-30 MED ORDER — AMIODARONE IV BOLUS ONLY 150 MG/100ML
150.0000 mg | Freq: Once | INTRAVENOUS | Status: DC
  Filled 2023-06-30: qty 100

## 2023-06-30 MED ORDER — ORAL CARE MOUTH RINSE: 15.0000 mL | OROMUCOSAL | Status: DC | PRN

## 2023-06-30 MED ORDER — MILRINONE LACTATE IN DEXTROSE 20-5 MG/100ML-% IV SOLN
0.1250 ug/kg/min | INTRAVENOUS | Status: DC
  Administered 2023-06-30 – 2023-07-02 (×3): 0.125 ug/kg/min via INTRAVENOUS
  Filled 2023-06-30 (×3): qty 100

## 2023-06-30 MED ORDER — FUROSEMIDE 10 MG/ML IJ SOLN
40.0000 mg | Freq: Once | INTRAMUSCULAR | Status: AC
  Administered 2023-06-30: 40 mg via INTRAVENOUS
  Filled 2023-06-30: qty 4

## 2023-06-30 MED ORDER — SODIUM CHLORIDE 0.9 % IV SOLN
0.0700 mg/kg/h | INTRAVENOUS | Status: DC
  Administered 2023-06-30: 0.15 mg/kg/h via INTRAVENOUS
  Administered 2023-07-01: 0.08 mg/kg/h via INTRAVENOUS
  Filled 2023-06-30 (×2): qty 250

## 2023-06-30 MED ORDER — CARMEX CLASSIC LIP BALM EX OINT
1.0000 | TOPICAL_OINTMENT | CUTANEOUS | Status: DC | PRN
  Administered 2023-06-30: 1 via TOPICAL
  Filled 2023-06-30: qty 10

## 2023-06-30 MED ORDER — SENNA 8.6 MG PO TABS
1.0000 | ORAL_TABLET | Freq: Every day | ORAL | Status: DC
  Administered 2023-06-30 – 2023-07-13 (×8): 8.6 mg via ORAL
  Filled 2023-06-30 (×9): qty 1

## 2023-06-30 NOTE — Progress Notes (Addendum)
 ANTICOAGULATION CONSULT NOTE  Pharmacy Consult for heparin >> bivalirudin Indication: Impella RP  Allergies  Allergen Reactions   Codeine Other (See Comments)    headache   Crestor [Rosuvastatin] Other (See Comments)    Muscle aches   Other Nausea And Vomiting    Anesthesia-severe vomiting   Simvastatin Other (See Comments)    Muscle aches   Egg White (Egg Protein)     Other Reaction(s): Throat closes   Ezetimibe      Other Reaction(s): crippling muscle aches   Hydrocodone-Acetaminophen      Other Reaction(s): migraines   Peanut (Diagnostic)     Other Reaction(s): itchy   Shellfish Allergy Other (See Comments)    Throat swelling   Statins     Patient Measurements: Height: 5' 3 (160 cm) Weight: 82.2 kg (181 lb 3.5 oz) IBW/kg (Calculated) : 52.4 Heparin  Dosing Weight: 68 kg   Vital Signs: Temp: 98.6 F (37 C) (06/13 0615) Temp Source: Core (Comment) (06/13 0400) Pulse Rate: 48 (06/13 0615)  Labs: Recent Labs    06/27/23 1629 06/27/23 1700 06/28/23 0455 06/28/23 0730 06/28/23 0800 06/28/23 1017 06/28/23 1721 06/29/23 0601 06/29/23 0609 06/29/23 1159 06/29/23 2249 06/30/23 0424 06/30/23 0504  HGB 13.3   < > 11.7*  --    < >  --   --  10.6* 10.2*  --   --  9.3* 8.2*  HCT 38.4   < > 34.5*  --    < >  --   --  31.4* 30.0*  --   --  27.6* 24.0*  PLT 168  --  118*  --   --   --   --  73*  --   --   --  56*  --   HEPARINUNFRC  --    < >  --   --    < >  --    < >  --   --  <0.10* 0.24* 0.24*  --   CREATININE  --    < > 1.06*  --   --   --   --  0.77  --   --   --  0.59  --   TROPONINIHS 6,049*  --   --  4,251*  --  3,754*  --   --   --   --   --   --   --    < > = values in this interval not displayed.    Estimated Creatinine Clearance: 64.5 mL/min (by C-G formula based on SCr of 0.59 mg/dL).  Medical History: Past Medical History:  Diagnosis Date   Alcohol intoxication (HCC) 05/2014    ED note    Anxiety    Arthritis    Boils    Chronic back pain     Community acquired pneumonia 01/05/2016   Depression    Fibromyalgia    GERD (gastroesophageal reflux disease)    Heart attack (HCC)    Hepatic cyst 01/06/2016   Hyperlipidemia    Hypertension    Insomnia    Leg cramps    Migraines    with aura   Mini stroke 05/2014   with paralysis for 1 hr   Osteopenia    PONV (postoperative nausea and vomiting)    severe   Postmenopausal bleeding 12/2015   PTSD (post-traumatic stress disorder)    S/P epidural steroid injection    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, passed out, 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  Not on anticoagulation prior to admission.  Pharmacy consulted for heparin  dosing.  -Chest CT w/ Type B dissection (small ulcerated plaque/developing intramural hematoma ~8 mm x 14 mm)  6/13 heparin  level 0.24, again subtherapeutic with heparin  infusion at 1350 units/h. Hgb 8.2 down from 10.2, plt 56 low and continuing to downtrend. LDH remains elevated at 444, concerning for hemolysis attributed to Impella. No issues or signs of bleeding reported by RN. Decreased Impella performance from P8 to P7 yesterday. With the decrease in platelet count there is also concern for HIT. Sending HIT ab and pharmacy consulted to transition from heparin  to bivalirudin.  Plan per HF team is to transition to bivalirudin now, decrease Impella performance after heparin  to bivalirudin switch, and possibly pull Impella tomorrow.   Goal of Therapy:  aPTT 50-70 seconds Monitor platelets by anticoagulation protocol: Yes   Plan:  Continue bicarb Impella purge STOP heparin  Start bivalirudin at 0.1mg /kg/hr Check aPTT ~2h after bivalirudin start Daily CBC, aPTT   Monitor for signs/symptoms of bleeding  Jerrel Mor, PharmD PGY1 Pharmacy Resident 06/30/2023 9:50 AM  **ADDENDUM 6/13 >>>1230 aPTT 84, supratherapeutic for low goal with bivalirudin running at 0.15mg /kg/hr.   Plan: decrease to 0.1mg /kg/hr and check a 4 hour level  Jerrel Mor, PharmD PGY1 Pharmacy Resident 06/30/2023 2:51 PM

## 2023-06-30 NOTE — Progress Notes (Signed)
 NAME:  Vicki [name], MRN:  [id number], DOB:  19-Jul-1950, LOS: 3 ADMISSION DATE:  06/27/2023, CONSULTATION DATE:  06/27/23 REFERRING MD:  Bensimhon, CHIEF COMPLAINT:  acute RV failure   History of Present Illness:  Vicki [name] is a 73 y/o woman with a history of HTN, HLD, former tobacco abuse who presented with CP x 3 days that radiated to the jaw and back. She had associated nausea w/ vomiting, lightheadedness, weakness, and diaphoresis. She had these symptoms evaluated at [facility] on 6/8 but refused to go to the ED for further evaluation despite this recommendation. She was diagnosed with atrial fibrillation during that visit. Today she presented with worsening og SOB, dizziness, vomiting, CP. She took [place] powder x 2 at home prior to presentation. The past few days she has not been able to keep anything down and despite taking her BP meds, she has vomited them up. In the ED she was noted to be in Afib with HR 30-40s and hypotension. CTA did not demonstrate PE. Cardiology was consulted and she was evaluated with L&R heart catherization, found to have acute RV failure with low CO and no obvious acute coronary blockage, but her RCA was not able to be seen angiographically. She had an RP Impella placed for R-sided MCS. PCCM was consulted for ICU management.   Pertinent  Medical History  Stroke Seizures PTSD HTN HLD GERD FM  Significant Hospital Events: Including procedures, antibiotic start and stop dates in addition to other pertinent events   6/10 admitted, LHC, RHC, RP impella placed 6/11 remains bradycardic in 40's, intermittent idioventricular rhythm, Impella P-7 with flows 3.2L and with NE at 4: CVP 11, CO 2.7, CI 1.5 6/12 P8> 7, increased NE, added vaso   EKG 6/8: Afib, QS waves in inferior leads with TWI, R waves in V1. CT chest 6/10>  mild nonspecific GGO throughout both lungs, no architectural distortion. No PE. Dilated IVC with dilated intrahepatic veins. Descending aortic plaque, small  intramural hematoma. Slightly abnormal GB.   Interim History / Subjective:  Small emesis after drinking some juice and taking PO meds, somewhat sleepy after phenergan   NE 3, off vaso.  Remains on P7 CVP 11-12, coox 55 Heparin  changed to bivalirudin due to worsening thrombocytopenia  Objective    Blood pressure (!) 160/114, pulse (!) 44, temperature 98.2 F (36.8 C), resp. rate 14, height 5' 3 (1.6 m), weight 82.2 kg, last menstrual period 07/18/2011, SpO2 96%. PAP: (23-327)/(11-36) 42/25 CVP:  [1 mmHg-42 mmHg] 11 mmHg PCWP:  [22 mmHg] 22 mmHg CO:  [3.2 L/min-4.9 L/min] 3.6 L/min CI:  [1.8 L/min/m2-2.79 L/min/m2] 2.07 L/min/m2      Intake/Output Summary (Last 24 hours) at 06/30/2023 1120 Last data filed at 06/30/2023 1100 Gross per 24 hour  Intake 1628.53 ml  Output 1200 ml  Net 428.53 ml   Filed Weights   06/28/23 0615 06/29/23 0645 06/30/23 0615  Weight: 75.2 kg 76.2 kg 82.2 kg   Examination: General:  pleasant older female sitting upright in bed in NAD, sleepy HEENT: MM pink/moist, pupils 4/r, anicteric Neuro: Alert, oriented and mostly appropriate, possible intermittent confusion, MAE CV: junctional in 40's, few beat run of SVT noted, R IJ impella, L internal jugular -PA cath, R ax aline PULM:  non labored, clear anteriorly, 1L [place] 96% GI: soft, bs great, ND, purwick- amber Extremities: warm/dry, no tibial edema  Skin: no rashes   UOP 1.5L/ 24hrs No BM still Wt 75.2> 76.2> ?82kg Afebrile Labs reviewed> Na 125, K  4.7, sCr 0.59, LDH 547> 444, plts 73> 56   Assessment and Plan   Cardiogenic shock due to acute RV failure NSTEMI with RV infarct   Symptomatic junctional bradycardia Atrial fibrillation with slow ventricular response Thrombocytopenia - impella weaning (w/ bicarb purge)/ intropes/ pressors/ diureses per AHF.  Plans to wean impella if hemodynamics remain stable  - off vaso, NE 3, remains on DB 5 - tele monitoring- remains mostly junctional, query  occasional Pwave, few short burst of SVT,  - trending coox, hemodynamics, LDH, CVP - optimize electrolytes, Mag> 2, K > 4> K replacement today  - PCCM will be available as needed   Thrombocytopenia - plts continue to downtrend, LDH down, HIT score low.  SRA pending.  Changed from heparin  to bivalirudin - trend CBC  Nausea and vomiting due to acute RV failure At risk for malnutrition - if unable to tolerate further clears> needs advance to fulls with protein, will need cortrak this afternoon - abd exam/ bowel sounds reassuring.  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   Best Practice (right click and Reselect all SmartList Selections daily)   Diet/type: clear liquids> advance as tolerated DVT prophylaxis other Pressure ulcer(s): pressure ulcer assessment deferred -- see RN assessment GI prophylaxis: N/A Lines: Central line and Arterial Line Foley:  N/A Code Status:  full code Last date of multidisciplinary goals of care discussion [patient updated 6/10]  Husband at bedside, updated 6/13   Critical care time: 30 min     Vicki Glisson, MSN, AG-ACNP-BC [facility] 06/30/2023, 11:20 AM  See Amion for pager If no response to pager , please call [phone] until 7pm After 7:00 pm call Elink  [phone]

## 2023-06-30 NOTE — TOC Initial Note (Signed)
 Transition of Care [facility]) - Initial/Assessment Note    Patient Details  Name: Vicki [name] MRN: [id number] Date of Birth: 08/13/1950  Transition of Care [facility]) CM/SW Contact:    Benjiman Bras, RN Phone Number: [phone] 06/30/2023, 3:38 PM  Clinical Narrative:                 Spoke to pt and lives with husband. Gave permission to speak to husband. She was independent pta. No DME or HH in the past.   Will continue to follow for dc needs.   Expected Discharge Plan: Home w Home Health Services Barriers to Discharge: Continued Medical Work up   Patient Goals and CMS Choice Patient states their goals for this hospitalization and ongoing recovery are:: wants to get better          Expected Discharge Plan and Services   Discharge Planning Services: CM Consult   Living arrangements for the past 2 months: Single Family Home                                      Prior Living Arrangements/Services Living arrangements for the past 2 months: Single Family Home Lives with:: Spouse Patient language and need for interpreter reviewed:: Yes Do you feel safe going back to the place where you live?: Yes      Need for Family Participation in Patient Care: Yes (Comment) Care giver support system in place?: Yes (comment)   Criminal Activity/Legal Involvement Pertinent to Current Situation/Hospitalization: No - Comment as needed  Activities of Daily Living   ADL Screening (condition at time of admission) Independently performs ADLs?: Yes (appropriate for developmental age) Is the patient deaf or have difficulty hearing?: No Does the patient have difficulty seeing, even when wearing glasses/contacts?: No Does the patient have difficulty concentrating, remembering, or making decisions?: No  Permission Sought/Granted Permission sought to share information with : Case Manager, Family Supports, PCP Permission granted to share information with : Yes, Verbal Permission  Granted  Share Information with NAME: Vicki [name]  Permission granted to share info w AGENCY: Home Health, DME, PCP  Permission granted to share info w Relationship: husband  Permission granted to share info w Contact Information: [phone]  Emotional Assessment Appearance:: Appears stated age Attitude/Demeanor/Rapport: Engaged Affect (typically observed): Accepting Orientation: : Oriented to Self, Oriented to Place, Oriented to  Time, Oriented to Situation Alcohol / Substance Use: Not Applicable Psych Involvement: No (comment)  Admission diagnosis:  Cardiogenic shock (HCC) [R57.0] Patient Active Problem List   Diagnosis Date Noted   Cardiogenic shock (HCC) 06/27/2023   S/P lumbar fusion 12/31/2018   PTSD (post-traumatic stress disorder) 11/10/2017   Hypertriglyceridemia 10/07/2015   Abnormal nuclear stress test    Chest pain 07/23/2015   Palpitations 07/23/2015   Depression    Left-sided weakness 05/24/2014   Hyponatremia 05/24/2014   Right arm numbness 05/24/2014   Alcohol abuse 05/24/2014   Alcohol abuse with intoxication (HCC) 05/24/2014   Syncope and collapse 05/24/2014   Cerebral infarction [facility])    Essential hypertension    Hyperlipidemia 04/04/2013   PCP:  Faustina Hood, MD Pharmacy:   [facility] [address] [place] - [address] [facility] [place] [address] Phone: [phone] Fax: [phone]     Social Drivers of Health (SDOH) Social History: SDOH Screenings   Food Insecurity: No Food Insecurity (06/28/2023)  Housing: Low  Risk  (06/28/2023)  Transportation Needs: No Transportation Needs (06/28/2023)  Utilities: Not At Risk (06/28/2023)  Social Connections: Socially Integrated (06/28/2023)  Tobacco Use: Medium Risk (06/27/2023)   SDOH Interventions:     Readmission Risk Interventions     No data to display

## 2023-06-30 NOTE — Progress Notes (Signed)
 ANTICOAGULATION CONSULT NOTE  Pharmacy Consult for heparin >> bivalirudin Indication: Impella RP  Allergies  Allergen Reactions   Codeine Other (See Comments)    headache   Crestor [Rosuvastatin] Other (See Comments)    Muscle aches   Other Nausea And Vomiting    Anesthesia-severe vomiting   Simvastatin Other (See Comments)    Muscle aches   Egg White (Egg Protein)     Other Reaction(s): Throat closes   Ezetimibe      Other Reaction(s): crippling muscle aches   Hydrocodone-Acetaminophen      Other Reaction(s): migraines   Peanut (Diagnostic)     Other Reaction(s): itchy   Shellfish Allergy Other (See Comments)    Throat swelling   Statins     Patient Measurements: Height: 5' 3 (160 cm) Weight: 82.2 kg (181 lb 3.5 oz) IBW/kg (Calculated) : 52.4 Heparin  Dosing Weight: 68 kg   Vital Signs: Temp: 98.2 F (36.8 C) (06/13 2000) Temp Source: Core (06/13 2000) Pulse Rate: 100 (06/13 2000)  Labs: Recent Labs    06/28/23 0455 06/28/23 0730 06/28/23 0800 06/28/23 1017 06/28/23 1721 06/29/23 0601 06/29/23 0609 06/29/23 1159 06/29/23 2249 06/30/23 0424 06/30/23 0504 06/30/23 1232 06/30/23 1707 06/30/23 2008  HGB 11.7*  --    < >  --   --  10.6*   < >  --   --  9.3* 8.2*  --  9.2*  --   HCT 34.5*  --    < >  --   --  31.4*   < >  --   --  27.6* 24.0*  --  26.8*  --   PLT 118*  --   --   --   --  73*  --   --   --  56*  --   --  65*  --   APTT  --   --   --   --   --   --   --   --   --   --   --  82*  --  75*  HEPARINUNFRC  --   --    < >  --    < >  --   --  <0.10* 0.24* 0.24*  --   --   --   --   CREATININE 1.06*  --   --   --   --  0.77  --   --   --  0.59  --   --   --   --   TROPONINIHS  --  4,251*  --  3,754*  --   --   --   --   --   --   --   --   --   --    < > = values in this interval not displayed.    Estimated Creatinine Clearance: 64.5 mL/min (by C-G formula based on SCr of 0.59 mg/dL).  Medical History: Past Medical History:  Diagnosis Date    Alcohol intoxication (HCC) 05/2014    ED note    Anxiety    Arthritis    Boils    Chronic back pain    Community acquired pneumonia 01/05/2016   Depression    Fibromyalgia    GERD (gastroesophageal reflux disease)    Heart attack (HCC)    Hepatic cyst 01/06/2016   Hyperlipidemia    Hypertension    Insomnia    Leg cramps    Migraines    with aura   Mini  stroke 05/2014   with paralysis for 1 hr   Osteopenia    PONV (postoperative nausea and vomiting)    severe   Postmenopausal bleeding 12/2015   PTSD (post-traumatic stress disorder)    S/P epidural steroid injection    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  Not on anticoagulation prior to admission.  Pharmacy consulted for heparin  dosing.  -Chest CT w/ Type B dissection (small ulcerated plaque/developing intramural hematoma ~8 mm x 14 mm)  6/13 heparin  level 0.24, again subtherapeutic with heparin  infusion at 1350 units/h. Hgb 8.2 down from 10.2, plt 56 low and continuing to downtrend. LDH remains elevated at 444, concerning for hemolysis attributed to Impella. No issues or signs of bleeding reported by RN. Decreased Impella performance from P8 to P7 yesterday. With the decrease in platelet count there is also concern for HIT. Sending HIT ab and pharmacy consulted to transition from heparin  to bivalirudin.  Plan per HF team is to transition to bivalirudin now, decrease Impella performance after heparin  to bivalirudin switch, and possibly pull Impella tomorrow.   Goal of Therapy:  aPTT 50-70 seconds Monitor platelets by anticoagulation protocol: Yes   Plan:  Continue bicarb Impella  purge STOP heparin  Start bivalirudin at 0.1mg /kg/hr Check aPTT ~2h after bivalirudin start Daily CBC, aPTT  Monitor for signs/symptoms of bleeding  Jerrel Mor, PharmD PGY1 Pharmacy Resident 06/30/2023 9:09 PM  **ADDENDUM 6/13 >>>1230 aPTT 84, supratherapeutic for low goal with bivalirudin running at 0.15mg /kg/hr.   Plan: decrease to 0.1mg /kg/hr and check a 4 hour level  **ADDENDUM** 6/13 PM - aPTT 74 sec remains above goal after ~33% rate decrease to 0.1 mg/kg/hr.  Drawn appropriately   Plan: decrease to 0.08 mg/kg/hr and check a 4 hour aPTT  Cecillia Cogan, PharmD Clinical Pharmacist 06/30/2023  9:10 PM

## 2023-06-30 NOTE — Progress Notes (Addendum)
 Advanced Heart Failure Rounding Note   Subjective:    Remains on Impella at P-7. Flow 3.2L   On DBA 5 + VP 0.01. NE just turned off given elevated MAPs.   Swan#s  CVP 11  PAP 41/18 (25) TD CI 2.01 FICK CI 2.3 Co-ox 55%, Hgb 8.2 PAPi ~2.0   On heparin  gtt  Plts 118>>73>>56K. Hgb 10.2>>9.3>>8.2.  LDH 264>>498>>547>>444  C/w transient bradycardia, mid 40s.   Awake and alert. Tired. No dyspnea. PO intake remains poor. No BM since admit.    Objective:   Weight Range:  Vital Signs:   Temp:  [91.2 F (32.9 C)-99.5 F (37.5 C)] 98.4 F (36.9 C) (06/13 0715) Pulse Rate:  [42-93] 45 (06/13 0715) Resp:  [7-33] 16 (06/13 0715) BP: (78-160)/(42-114) 160/114 (06/12 1046) SpO2:  [78 %-100 %] 98 % (06/13 0715) Arterial Line BP: (66-192)/(21-113) 139/57 (06/13 0715) Weight:  [82.2 kg] 82.2 kg (06/13 0615) Last BM Date :  (PTA)  Weight change: Filed Weights   06/28/23 0615 06/29/23 0645 06/30/23 0615  Weight: 75.2 kg 76.2 kg 82.2 kg    Intake/Output:   Intake/Output Summary (Last 24 hours) at 06/30/2023 0724 Last data filed at 06/30/2023 0700 Gross per 24 hour  Intake 1750.86 ml  Output 1550 ml  Net 200.86 ml     PHYSICAL EXAM: CVP 11  General: fatigued appearing No respiratory difficulty HEENT: normal Neck: Rt internal jugular Impella, LIJ Swan JVD 11-12 cm. Rt Cor: PMI nondisplaced. Regular rate & rhythm.  Lungs: clear Abdomen: soft, nontender, nondistended. No hepatosplenomegaly. No bruits or masses. Good bowel sounds. Extremities: no cyanosis, clubbing, rash, edema Neuro: alert & oriented x 3, cranial nerves grossly intact. moves all 4 extremities w/o difficulty. Affect pleasant.   Telemetry: SB w/ periods of junctional bradycardia in the 40s. Personally reviewed   Labs: Basic Metabolic Panel: Recent Labs  Lab 06/27/23 1020 06/27/23 1034 06/27/23 1231 06/27/23 2232 06/28/23 0444 06/28/23 0455 06/28/23 9604 06/29/23 0601 06/29/23 0609  06/30/23 0424 06/30/23 0429 06/30/23 0504  NA 134* 132*  133*   < > 129*   < > 126* 127* 129* 128* 125*  --  127*  K 4.1 4.0  4.0   < > 4.2   < > 4.3 4.3 3.8 3.9 4.7  --  4.4  CL 99 100  --  99  --  97*  --  97*  --  96*  --   --   CO2 19*  --   --  22  --  21*  --  23  --  23  --   --   GLUCOSE 110* 109*  --  118*  --  135*  --  149*  --  120*  --   --   BUN 31* 32*  --  29*  --  26*  --  14  --  12  --   --   CREATININE 1.49* 1.40*  --  1.18*  --  1.06*  --  0.77  --  0.59  --   --   CALCIUM 8.6*  --   --  8.1*  --  7.8*  --  8.0*  --  8.0*  --   --   MG  --   --   --  1.6*  --   --   --  2.1  --   --  2.0  --    < > = values in this interval not displayed.  Liver Function Tests: Recent Labs  Lab 06/27/23 1020 06/27/23 2232 06/29/23 0601 06/30/23 0424  AST 123* 131* 97* 54*  ALT 123* 124* 109* 85*  ALKPHOS 62 57 47 52  BILITOT 0.8 0.8 1.5* 1.2  PROT 5.3* 5.3* 5.2* 4.9*  ALBUMIN 3.0* 3.0* 2.7* 2.4*   No results for input(s): LIPASE, AMYLASE in the last 168 hours. No results for input(s): AMMONIA in the last 168 hours.  CBC: Recent Labs  Lab 06/27/23 1020 06/27/23 1034 06/27/23 1629 06/27/23 1700 06/28/23 0455 06/28/23 0808 06/29/23 0601 06/29/23 0609 06/30/23 0424 06/30/23 0504  WBC 15.6*  --  21.0*  --  15.1*  --  12.3*  --  8.5  --   NEUTROABS 11.7*  --  16.6*  --   --   --   --   --   --   --   HGB 13.1   < > 13.3   < > 11.7* 11.2* 10.6* 10.2* 9.3* 8.2*  HCT 39.8   < > 38.4   < > 34.5* 33.0* 31.4* 30.0* 27.6* 24.0*  MCV 97.3  --  94.6  --  94.0  --  94.3  --  96.2  --   PLT 153  --  168  --  118*  --  73*  --  56*  --    < > = values in this interval not displayed.    Cardiac Enzymes: No results for input(s): CKTOTAL, CKMB, CKMBINDEX, TROPONINI in the last 168 hours.  BNP: BNP (last 3 results) Recent Labs    06/27/23 1020  BNP 543.0*    ProBNP (last 3 results) No results for input(s): PROBNP in the last 8760  hours.    Other results:  Imaging: DG Chest Port 1 View Result Date: 06/29/2023 CLINICAL DATA:  Central line placement. EXAM: PORTABLE CHEST 1 VIEW COMPARISON:  06/28/2023 FINDINGS: Similar low lung volumes with asymmetric elevation right hemidiaphragm. The cardio pericardial silhouette is enlarged. There is pulmonary vascular congestion without overt pulmonary edema. Basilar atelectasis with probable small left pleural effusion. Left IJ pulmonary catheter tip is in the main pulmonary artery. Stable position of the Impella device overlying the main pulmonary artery. Left chest tube again noted without evidence for left-sided pneumothorax. IMPRESSION: 1. Left IJ pulmonary catheter tip is in the main pulmonary artery. Stable position of the Impella device with tip overlying the main pulmonary artery. 2. Low lung volumes with basilar atelectasis and probable small left pleural effusion. Electronically Signed   By: Donnal Fusi M.D.   On: 06/29/2023 07:29   DG Chest Port 1 View Result Date: 06/28/2023 CLINICAL DATA:  Central line placement EXAM: PORTABLE CHEST 1 VIEW COMPARISON:  June 28, 2023, 5:16 a.m. FINDINGS: Tip of the Swan-Ganz in the right main proximal pulmonary artery. The tip of the Impella catheter device is in the outflow portion of the main pulmonary artery. Left chest tube. No pneumothorax No congestive changes No significant pleural effusions IMPRESSION: *No acute cardiopulmonary process. *Support devices as above. Electronically Signed   By: Fredrich Jefferson M.D.   On: 06/28/2023 09:24     Medications:     Scheduled Medications:  acetaminophen   650 mg Oral Q8H   aspirin   81 mg Oral Daily   Chlorhexidine  Gluconate Cloth  6 each Topical Daily   levothyroxine   50 mcg Oral Daily   lidocaine   1 patch Transdermal Q24H   polyethylene glycol  17 g Oral Daily   sodium chloride  flush  3  mL Intravenous Q12H    Infusions:  sodium chloride  Stopped (06/30/23 0645)   DOBUTamine  5 mcg/kg/min  (06/30/23 0700)   heparin  1,350 Units/hr (06/30/23 0700)   norepinephrine  (LEVOPHED ) Adult infusion 4 mcg/min (06/30/23 0700)   promethazine  (PHENERGAN ) injection (IM or IVPB) 150 mL/hr at 06/30/23 0700   sodium bicarbonate  25 mEq (Impella PURGE) in dextrose  5 % 1000 mL bag     vasopressin  0.01 Units/min (06/30/23 0700)    PRN Medications: sodium chloride , sodium chloride , HYDROmorphone  (DILAUDID ) injection, hydrOXYzine , lip balm, ondansetron  (ZOFRAN ) IV, oxyCODONE , promethazine  (PHENERGAN ) injection (IM or IVPB), sodium chloride  flush, zolpidem    Assessment/Plan:   1. Cardiogenic Shock, 2/2 Profound RV Failure   - cath 06/27/23 normal left-dominant system with non-visualized tiny RCA - CT no PE - Echo LVEF 60-65% RV moderate HK with McConnell's sign - s/p Impella RP placement - POCUS ECHO 6/12 w/ improvement LVEF 60% RV mild HK  - Remains on Impella at P-7, Flow 3.2L/min. On DBA 5. TD CI 2.0, FICK 2.3, PAPi ~2.0  - Continue DBA @5 . Stop VP and restart low dose NE @3 . Reshoot swan #s later today. If stable, will plan to wean Impella down.  - CVP 11. Give 40 mg IV Lasix 1    2. NSTEMI with RV infarct - plan as above - continue ASA/statin - No b-blocker with shock  3. Junctional rhythm with pauses - improving w/ DBA - continue to monitor   4. 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 Shock Liver - in setting of RV failure  - continue supportive care   6. AKI  - Initial SCr 1.4 due to ATN/shock - now resolved   7. Hyponatremia - due to HF.  - restrict FW  8. Thrombocytopenia - plts continue to drop, now 56K. LDH stable  - check HIT panel - stop heparin  and switch to Bival  - aim to remove Impella tomorrow   Length of Stay: 3   Brittainy Elissa Guise  06/30/2023, 7:24 AM  Advanced Heart Failure Team Pager  [phone] (M-F; 7a - 4p)  Please contact [facility] for night-coverage after hours (4p -7a ) and weekends on [email or web address]  Agree with above.  Remains on Impella-RP at P-7 Flow 3.2L Waveforms ok  Off NE (was hypertensive) On VP 0.01.   Claudie Cull numbers reviewed personally. Improving. PAPi 2.0   Platelets 56k On heparin . No bleeding. LDH stable 454  General:  Sitting up in bed No resp difficulty HEENT: normal Neck: supple. RIJ Impella. LIJ swan  MWN:UUVOZ reg Lungs: clear Abdomen: soft, nontender, nondistended. No hepatosplenomegaly. No bruits or masses. Good bowel sounds. Extremities: no cyanosis, clubbing, rash, tr edema Neuro: alert & orientedx3, cranial nerves grossly intact. moves all 4 extremities w/o difficulty. Affect pleasant  Hemodynamics improving. Will add back NE. Stop VP.   Switch heparin  to bival. Check HIT panel  Once on bival will turn Impela down to P-4 and see how she does. If tolerates well will plan wean over next 24 hours.   Lasix 40IV x 1.   CRITICAL CARE Performed by: Jeny Nield  Total critical care time: 55 minutes  Critical care time was exclusive of separately billable procedures and treating other patients.  Critical care was necessary to treat or prevent imminent or life-threatening deterioration.  Critical care was time spent  personally by me (independent of midlevel providers or residents) on the following activities: development of treatment plan with patient and/or surrogate as well as nursing, discussions with consultants, evaluation of patient's response to treatment, examination of patient, obtaining history from patient or surrogate, ordering and performing treatments and interventions, ordering and review of laboratory studies, ordering and review of radiographic studies, pulse oximetry and re-evaluation of patient's condition.  Jules Oar, MD  11:08 AM

## 2023-07-01 DIAGNOSIS — R570 Cardiogenic shock: Secondary | ICD-10-CM | POA: Diagnosis not present

## 2023-07-01 LAB — BASIC METABOLIC PANEL WITH GFR
Anion gap: 9 (ref 5–15)
Anion gap: 9 (ref 5–15)
BUN: 9 mg/dL (ref 8–23)
BUN: 7 mg/dL — ABNORMAL LOW (ref 8–23)
CO2: 23 mmol/L (ref 22–32)
CO2: 25 mmol/L (ref 22–32)
Calcium: 8.3 mg/dL — ABNORMAL LOW (ref 8.9–10.3)
Calcium: 8.4 mg/dL — ABNORMAL LOW (ref 8.9–10.3)
Chloride: 95 mmol/L — ABNORMAL LOW (ref 98–111)
Chloride: 95 mmol/L — ABNORMAL LOW (ref 98–111)
Creatinine, Ser: 0.6 mg/dL (ref 0.44–1.00)
Creatinine, Ser: 0.63 mg/dL (ref 0.44–1.00)
GFR, Estimated: >60 mL/min (ref >60)
GFR, Estimated: >60 mL/min (ref >60)
Glucose, Bld: 110 mg/dL — ABNORMAL HIGH (ref 70–99)
Glucose, Bld: 112 mg/dL — ABNORMAL HIGH (ref 70–99)
Potassium: 4.2 mmol/L (ref 3.5–5.1)
Potassium: 3.8 mmol/L (ref 3.5–5.1)
Sodium: 127 mmol/L — ABNORMAL LOW (ref 135–145)
Sodium: 129 mmol/L — ABNORMAL LOW (ref 135–145)

## 2023-07-01 LAB — CBC
HCT: 27.3 % — ABNORMAL LOW (ref 36.0–46.0)
Hemoglobin: 9.3 g/dL — ABNORMAL LOW (ref 12.0–15.0)
MCH: 32.3 pg (ref 26.0–34.0)
MCHC: 34.1 g/dL (ref 30.0–36.0)
MCV: 94.8 fL (ref 80.0–100.0)
Platelets: 64 10*3/uL — ABNORMAL LOW (ref 150–400)
RBC: 2.88 MIL/uL — ABNORMAL LOW (ref 3.87–5.11)
RDW: 13 % (ref 11.5–15.5)
WBC: 7.5 10*3/uL (ref 4.0–10.5)
nRBC: 0 % (ref 0.0–0.2)

## 2023-07-01 LAB — APTT
aPTT: 70 s — ABNORMAL HIGH (ref 24–36)
aPTT: 62 s — ABNORMAL HIGH (ref 24–36)

## 2023-07-01 LAB — COOXEMETRY PANEL
Carboxyhemoglobin: 2 % — ABNORMAL HIGH (ref 0.5–1.5)
Methemoglobin: 1.1 % (ref 0.0–1.5)
O2 Saturation: 62.4 %
Total hemoglobin: 9.7 g/dL — ABNORMAL LOW (ref 12.0–16.0)

## 2023-07-01 LAB — HEPATIC FUNCTION PANEL
ALT: 62 U/L — ABNORMAL HIGH (ref 0–44)
AST: 33 U/L (ref 15–41)
Albumin: 2.5 g/dL — ABNORMAL LOW (ref 3.5–5.0)
Alkaline Phosphatase: 54 U/L (ref 38–126)
Bilirubin, Direct: 0.4 mg/dL — ABNORMAL HIGH (ref 0.0–0.2)
Indirect Bilirubin: 0.8 mg/dL (ref 0.3–0.9)
Total Bilirubin: 1.2 mg/dL (ref 0.0–1.2)
Total Protein: 5.1 g/dL — ABNORMAL LOW (ref 6.5–8.1)

## 2023-07-01 LAB — LACTATE DEHYDROGENASE: LDH: 370 U/L — ABNORMAL HIGH (ref 98–192)

## 2023-07-01 LAB — HEPARIN INDUCED PLATELET AB (HIT ANTIBODY): Heparin Induced Plt Ab: 0.829 {OD_unit} — ABNORMAL HIGH (ref 0.000–0.400)

## 2023-07-01 MED ORDER — BOOST / RESOURCE BREEZE PO LIQD CUSTOM: 1.0000 | Freq: Three times a day (TID) | ORAL | Status: DC

## 2023-07-01 MED ORDER — FUROSEMIDE 10 MG/ML IJ SOLN
80.0000 mg | Freq: Once | INTRAMUSCULAR | Status: AC
  Administered 2023-07-01: 80 mg via INTRAVENOUS
  Filled 2023-07-01: qty 8

## 2023-07-01 MED ORDER — POTASSIUM CHLORIDE 10 MEQ/50ML IV SOLN
10.0000 meq | INTRAVENOUS | Status: AC
  Administered 2023-07-01 (×4): 10 meq via INTRAVENOUS
  Filled 2023-07-01 (×4): qty 50

## 2023-07-01 MED ORDER — METOLAZONE 2.5 MG PO TABS
2.5000 mg | ORAL_TABLET | Freq: Once | ORAL | Status: AC
  Administered 2023-07-01: 2.5 mg via ORAL
  Filled 2023-07-01: qty 1

## 2023-07-01 NOTE — Plan of Care (Signed)
  Problem: Clinical Measurements: Goal: Diagnostic test results will improve Outcome: Progressing Goal: Respiratory complications will improve Outcome: Progressing   Problem: Elimination: Goal: Will not experience complications related to urinary retention Outcome: Progressing   Problem: Fluid Volume: Goal: Ability to achieve a balanced intake and output will improve Outcome: Progressing

## 2023-07-01 NOTE — Progress Notes (Signed)
 Advanced Heart Failure Rounding Note   Subjective:    Remains on RP-Impella now at P-6 with 2.7L flow   Inotropes adjusted yesterday. Tolerated brief Impella wean to P-4 yesterday  However overnight developed AF and broke spontaneously.   Now on DBA 5 + NE 3 + milrinone 0.125 of VP   Heparin  switched to bival 6/13  PLTs now stable at 65K  Swan#s  CVP 8 PAP 32/14 Co-ox 62%, PAPi 2.2  Remains SOB. Weight up 10 pounds. + orthopnea/PND   CXR with pleural effusions and pulmonary edema  LDH down to 370  Objective:   Weight Range:  Vital Signs:   Temp:  [97.5 F (36.4 C)-99.3 F (37.4 C)] 99.1 F (37.3 C) (06/14 0915) Pulse Rate:  [44-125] 58 (06/14 0915) Resp:  [10-23] 14 (06/14 0915) SpO2:  [83 %-100 %] 97 % (06/14 0915) Arterial Line BP: (116-189)/(45-79) 145/55 (06/14 0915) Weight:  [81.2 kg] 81.2 kg (06/14 0500) Last BM Date :  (PTA)  Weight change: Filed Weights   06/29/23 0645 06/30/23 0615 07/01/23 0500  Weight: 76.2 kg 82.2 kg 81.2 kg    Intake/Output:   Intake/Output Summary (Last 24 hours) at 07/01/2023 0945 Last data filed at 07/01/2023 0930 Gross per 24 hour  Intake 1326 ml  Output 2650 ml  Net -1324 ml     PHYSICAL EXAM: General:  Sitting up in bed. SOB HEENT: normal Neck: supple. + RIJ Impella Cor: Reg  Lungs: + crackles Abdomen: soft, nontender, nondistended. No hepatosplenomegaly. No bruits or masses. Good bowel sounds. Extremities: no cyanosis, clubbing, rash, 1-2+ edema Neuro: alert & orientedx3, cranial nerves grossly intact. moves all 4 extremities w/o difficulty. Affect pleasant  Telemetry: SB w/ periods of junctional bradycardia in the 40s. Personally reviewed   Labs: Basic Metabolic Panel: Recent Labs  Lab 06/27/23 2232 06/28/23 0444 06/28/23 0455 06/28/23 0808 06/29/23 0601 06/29/23 0609 06/30/23 0424 06/30/23 0429 06/30/23 0504 07/01/23 0507  NA 129*   < > 126*   < > 129* 128* 125*  --  127* 127*  K 4.2   < >  4.3   < > 3.8 3.9 4.7  --  4.4 4.2  CL 99  --  97*  --  97*  --  96*  --   --  95*  CO2 22  --  21*  --  23  --  23  --   --  23  GLUCOSE 118*  --  135*  --  149*  --  120*  --   --  110*  BUN 29*  --  26*  --  14  --  12  --   --  9  CREATININE 1.18*  --  1.06*  --  0.77  --  0.59  --   --  0.60  CALCIUM 8.1*  --  7.8*  --  8.0*  --  8.0*  --   --  8.3*  MG 1.6*  --   --   --  2.1  --   --  2.0  --   --    < > = values in this interval not displayed.    Liver Function Tests: Recent Labs  Lab 06/27/23 1020 06/27/23 2232 06/29/23 0601 06/30/23 0424 07/01/23 0507  AST 123* 131* 97* 54* 33  ALT 123* 124* 109* 85* 62*  ALKPHOS 62 57 47 52 54  BILITOT 0.8 0.8 1.5* 1.2 1.2  PROT 5.3* 5.3* 5.2* 4.9* 5.1*  ALBUMIN 3.0* 3.0* 2.7* 2.4* 2.5*   No results for input(s): LIPASE, AMYLASE in the last 168 hours. No results for input(s): AMMONIA in the last 168 hours.  CBC: Recent Labs  Lab 06/27/23 1020 06/27/23 1034 06/27/23 1629 06/27/23 1700 06/28/23 0455 06/28/23 0808 06/29/23 0601 06/29/23 0609 06/30/23 0424 06/30/23 0504 06/30/23 1707 07/01/23 0507  WBC 15.6*  --  21.0*  --  15.1*  --  12.3*  --  8.5  --  8.4 7.5  NEUTROABS 11.7*  --  16.6*  --   --   --   --   --   --   --   --   --   HGB 13.1   < > 13.3   < > 11.7*   < > 10.6* 10.2* 9.3* 8.2* 9.2* 9.3*  HCT 39.8   < > 38.4   < > 34.5*   < > 31.4* 30.0* 27.6* 24.0* 26.8* 27.3*  MCV 97.3  --  94.6  --  94.0  --  94.3  --  96.2  --  94.7 94.8  PLT 153  --  168  --  118*  --  73*  --  56*  --  65* 64*   < > = values in this interval not displayed.    Cardiac Enzymes: No results for input(s): CKTOTAL, CKMB, CKMBINDEX, TROPONINI in the last 168 hours.  BNP: BNP (last 3 results) Recent Labs    06/27/23 1020  BNP 543.0*    ProBNP (last 3 results) No results for input(s): PROBNP in the last 8760 hours.    Other results:  Imaging: DG CHEST PORT 1 VIEW Result Date: 06/30/2023 CLINICAL DATA:  Right  ventricular assist device. EXAM: PORTABLE CHEST 1 VIEW COMPARISON:  06/29/2023 FINDINGS: Low volume film with asymmetric elevation of the right hemidiaphragm, similar to prior. Vascular congestion and bibasilar collapse/consolidation is not substantially changed. Probable small bilateral pleural effusions. Right IJ Impella device overlies the main pulmonary artery, similar to prior with tip projecting over the left main pulmonary artery. Left IJ pulmonary artery catheter tip overlies the proximal right main pulmonary artery. Heart size stable. Telemetry leads overlie the chest. IMPRESSION: 1. No substantial change in vascular congestion and bibasilar collapse/consolidation. 2. Probable small bilateral pleural effusions. Electronically Signed   By: Donnal Fusi M.D.   On: 06/30/2023 07:38     Medications:     Scheduled Medications:  aspirin   81 mg Oral Daily   Chlorhexidine  Gluconate Cloth  6 each Topical Daily   feeding supplement  1 Container Oral TID BM   levothyroxine   50 mcg Oral Daily   lidocaine   1 patch Transdermal Q24H   polyethylene glycol  17 g Oral BID   senna  1 tablet Oral QHS   sodium chloride  flush  3 mL Intravenous Q12H    Infusions:  sodium chloride  10 mL/hr at 07/01/23 0900   amiodarone     bivalirudin (ANGIOMAX) 250 mg in sodium chloride  0.9 % 500 mL (0.5 mg/mL) infusion 0.08 mg/kg/hr (07/01/23 0900)   DOBUTamine  5 mcg/kg/min (07/01/23 0900)   milrinone 0.125 mcg/kg/min (07/01/23 0900)   norepinephrine  (LEVOPHED ) Adult infusion 3 mcg/min (07/01/23 0900)   promethazine  (PHENERGAN ) injection (IM or IVPB) Stopped (06/30/23 0705)   sodium bicarbonate  25 mEq (Impella PURGE) in dextrose  5 % 1000 mL bag     vasopressin  Stopped (06/30/23 0912)    PRN Medications: sodium chloride , sodium chloride , acetaminophen , HYDROmorphone  (DILAUDID ) injection, hydrOXYzine , lip balm, ondansetron  (ZOFRAN ) IV,  mouth rinse, oxyCODONE , promethazine  (PHENERGAN ) injection (IM or IVPB), sodium  chloride flush, zolpidem    Assessment/Plan:   1. Cardiogenic Shock, 2/2 Profound RV Failure   - cath 06/27/23 normal left-dominant system with non-visualized tiny RCA - CT no PE - Echo LVEF 60-65% RV moderate HK with McConnell's sign - s/p Impella RP placement - POCUS ECHO 6/12 w/ improvement LVEF 60% RV mild HK  - Remains on Impella at P-6. Flow 2.7 Waveforms look good. LDH 370 - Now on DBA 5 + NE 3 + milrinone 0.125 of VP  - Main issue now is diastolic HF/pulmonary edema. Will need aggressive diuresis before pump removal.   2. Acute diastolic HF - diurese - wean NE as tolerated   3. NSTEMI with RV infarct - plan as above - continue ASA/statin - No b-blocker with shock  4. Junctional rhythm with pauses - improved w/ DBA - continue to monitor   5. 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 Shock Liver - in setting of RV failure  - improved - continue supportive care   7. AKI  - Initial SCr 1.4 due to ATN/shock - now resolved   8. Hyponatremia - due to HF. Na 127 - restrict FW  8. 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 No bleeding   CRITICAL CARE Performed by: Jules Oar  Total critical care time: 45 minutes  Critical care time was exclusive of separately billable procedures and treating other patients.  Critical care was necessary to treat or prevent imminent or life-threatening deterioration.  Critical care was time spent personally by me (independent of midlevel providers or residents) on the following activities: development of treatment plan with patient and/or surrogate as well as nursing, discussions with consultants, evaluation of patient's response to treatment, examination of patient, obtaining history from patient or surrogate,  ordering and performing treatments and interventions, ordering and review of laboratory studies, ordering and review of radiographic studies, pulse oximetry and re-evaluation of patient's condition.    Length of Stay: 4   Jules Oar MD  07/01/2023, 9:45 AM  Advanced Heart Failure Team Pager [phone] (M-F; 7a - 4p)  Please contact [facility] for night-coverage after hours (4p -7a ) and weekends on [email or web address]

## 2023-07-01 NOTE — Progress Notes (Signed)
 ANTICOAGULATION CONSULT NOTE Pharmacy Consult for  bivalirudin Indication: Impella RP Brief A/P: aPTT within goal range Continue bivalirudin at current rate   Allergies  Allergen Reactions   Codeine Other (See Comments)    headache   Crestor [Rosuvastatin] Other (See Comments)    Muscle aches   Other Nausea And Vomiting    Anesthesia-severe vomiting   Simvastatin Other (See Comments)    Muscle aches   Egg White (Egg Protein)     Other Reaction(s): Throat closes   Ezetimibe      Other Reaction(s): crippling muscle aches   Hydrocodone-Acetaminophen      Other Reaction(s): migraines   Peanut (Diagnostic)     Other Reaction(s): itchy   Shellfish Allergy Other (See Comments)    Throat swelling   Statins     Patient Measurements: Height: 5' 3 (160 cm) Weight: 82.2 kg (181 lb 3.5 oz) IBW/kg (Calculated) : 52.4 Heparin  Dosing Weight: 68 kg   Vital Signs: Temp: 97.9 F (36.6 C) (06/14 0100) Temp Source: Core (06/13 2000) Pulse Rate: 52 (06/14 0100)  Labs: Recent Labs    06/28/23 0455 06/28/23 0730 06/28/23 0800 06/28/23 1017 06/28/23 1721 06/29/23 0601 06/29/23 0609 06/29/23 1159 06/29/23 2249 06/30/23 0424 06/30/23 0504 06/30/23 1232 06/30/23 1707 06/30/23 2008 07/01/23 0053  HGB 11.7*  --    < >  --   --  10.6*   < >  --   --  9.3* 8.2*  --  9.2*  --   --   HCT 34.5*  --    < >  --   --  31.4*   < >  --   --  27.6* 24.0*  --  26.8*  --   --   PLT 118*  --   --   --   --  73*  --   --   --  56*  --   --  65*  --   --   APTT  --   --   --   --   --   --   --   --   --   --   --  82*  --  75* 70*  HEPARINUNFRC  --   --    < >  --    < >  --   --  <0.10* 0.24* 0.24*  --   --   --   --   --   CREATININE 1.06*  --   --   --   --  0.77  --   --   --  0.59  --   --   --   --   --   TROPONINIHS  --  4,251*  --  3,754*  --   --   --   --   --   --   --   --   --   --   --    < > = values in this interval not displayed.    Estimated Creatinine Clearance: 64.5 mL/min  (by C-G formula based on SCr of 0.59 mg/dL).  Assessment: 73 yo female in cardiogenic shock with RV failure s/p Impella RP 6/10 for heparin .    Goal of Therapy:  aPTT 50-70 seconds Monitor platelets by anticoagulation protocol: Yes   Plan:  Continue bivalirudin at current rate  Follow-up am labs.  Claudine Cullens, PharmD, BCPS

## 2023-07-01 NOTE — Progress Notes (Signed)
 PT Cancellation Note  Patient Details Name: Vicki [name] MRN: [id number] DOB: 1950-03-01   Cancelled Treatment:    Reason Eval/Treat Not Completed: Patient not medically ready. RN reports when they had to lay patient flat pt's HR went up from 50 to 160bpm and pt became very SOB. Pt with impella with plans to hopefully remove tomorrow. PT to return Monday to attempt to complete PT eval.    Renaee Caro, PT, DPT Acute Rehabilitation Services Secure chat preferred Office #: [phone]    Jenna Moan 07/01/2023, 11:44 AM

## 2023-07-01 NOTE — Progress Notes (Signed)
 OT Cancellation Note  Patient Details Name: RANDEE HUSTON MRN: [id number] DOB: 1950/02/14   Cancelled Treatment:    Reason Eval/Treat Not Completed: Patient not medically readyRN reports when they had to lay patient flat pt's HR went up from 50 to 160bpm and pt became very SOB. Pt with impella with plans to hopefully remove tomorrow. Will plan to return Monday to attempt to complete OT eval.   Karilyn Ouch, OTR/L [facility] Acute Rehabilitation Office: [phone]   Emery Hans 07/01/2023, 11:57 AM

## 2023-07-01 NOTE — Plan of Care (Signed)
  Problem: Education: Goal: Knowledge of General Education information will improve Description: Including pain rating scale, medication(s)/side effects and non-pharmacologic comfort measures Outcome: Progressing   Problem: Health Behavior/Discharge Planning: Goal: Ability to manage health-related needs will improve Outcome: Progressing   Problem: Clinical Measurements: Goal: Ability to maintain clinical measurements within normal limits will improve Outcome: Progressing Goal: Will remain free from infection Outcome: Progressing Goal: Diagnostic test results will improve Outcome: Progressing Goal: Respiratory complications will improve Outcome: Progressing Goal: Cardiovascular complication will be avoided Outcome: Progressing   Problem: Activity: Goal: Risk for activity intolerance will decrease Outcome: Progressing   Problem: Nutrition: Goal: Adequate nutrition will be maintained Outcome: Progressing   Problem: Coping: Goal: Level of anxiety will decrease Outcome: Progressing   Problem: Elimination: Goal: Will not experience complications related to bowel motility Outcome: Progressing Goal: Will not experience complications related to urinary retention Outcome: Progressing   Problem: Pain Managment: Goal: General experience of comfort will improve and/or be controlled Outcome: Progressing   Problem: Safety: Goal: Ability to remain free from injury will improve Outcome: Progressing   Problem: Skin Integrity: Goal: Risk for impaired skin integrity will decrease Outcome: Progressing   Problem: Cardiac: Goal: Ability to achieve and maintain adequate cardiopulmonary perfusion will improve Outcome: Progressing Goal: Vascular access site(s) Level 0-1 will be maintained Outcome: Progressing   Problem: Fluid Volume: Goal: Ability to achieve a balanced intake and output will improve Outcome: Progressing   Problem: Physical Regulation: Goal: Complications related  to the disease process, condition or treatment will be avoided or minimized Outcome: Progressing   Problem: Respiratory: Goal: Will regain and/or maintain adequate ventilation Outcome: Progressing

## 2023-07-02 ENCOUNTER — Inpatient Hospital Stay (HOSPITAL_COMMUNITY)

## 2023-07-02 DIAGNOSIS — R570 Cardiogenic shock: Secondary | ICD-10-CM | POA: Diagnosis not present

## 2023-07-02 LAB — POCT I-STAT EG7
Acid-Base Excess: 2 mmol/L (ref 0.0–2.0)
Acid-Base Excess: 2 mmol/L (ref 0.0–2.0)
Acid-Base Excess: 3 mmol/L — ABNORMAL HIGH (ref 0.0–2.0)
Bicarbonate: 27.4 mmol/L (ref 20.0–28.0)
Bicarbonate: 27.5 mmol/L (ref 20.0–28.0)
Bicarbonate: 28.2 mmol/L — ABNORMAL HIGH (ref 20.0–28.0)
Calcium, Ion: 1.13 mmol/L — ABNORMAL LOW (ref 1.15–1.40)
Calcium, Ion: 1.17 mmol/L (ref 1.15–1.40)
Calcium, Ion: 1.19 mmol/L (ref 1.15–1.40)
HCT: 26 % — ABNORMAL LOW (ref 36.0–46.0)
HCT: 27 % — ABNORMAL LOW (ref 36.0–46.0)
HCT: 27 % — ABNORMAL LOW (ref 36.0–46.0)
Hemoglobin: 8.8 g/dL — ABNORMAL LOW (ref 12.0–15.0)
Hemoglobin: 9.2 g/dL — ABNORMAL LOW (ref 12.0–15.0)
Hemoglobin: 9.2 g/dL — ABNORMAL LOW (ref 12.0–15.0)
O2 Saturation: 41 %
O2 Saturation: 51 %
O2 Saturation: 53 %
Patient temperature: 37
Patient temperature: 37
Patient temperature: 37
Potassium: 4.4 mmol/L (ref 3.5–5.1)
Potassium: 4.5 mmol/L (ref 3.5–5.1)
Potassium: 4.6 mmol/L (ref 3.5–5.1)
Sodium: 125 mmol/L — ABNORMAL LOW (ref 135–145)
Sodium: 126 mmol/L — ABNORMAL LOW (ref 135–145)
Sodium: 126 mmol/L — ABNORMAL LOW (ref 135–145)
TCO2: 29 mmol/L (ref 22–32)
TCO2: 29 mmol/L (ref 22–32)
TCO2: 30 mmol/L (ref 22–32)
pCO2, Ven: 46.3 mmHg (ref 44–60)
pCO2, Ven: 47 mmHg (ref 44–60)
pCO2, Ven: 47.6 mmHg (ref 44–60)
pH, Ven: 7.375 (ref 7.25–7.43)
pH, Ven: 7.379 (ref 7.25–7.43)
pH, Ven: 7.38 (ref 7.25–7.43)
pO2, Ven: 24 mmHg — CL (ref 32–45)
pO2, Ven: 28 mmHg — CL (ref 32–45)
pO2, Ven: 29 mmHg — CL (ref 32–45)

## 2023-07-02 LAB — CULTURE, BLOOD (ROUTINE X 2)
Culture: NO GROWTH
Culture: NO GROWTH
Special Requests: ADEQUATE

## 2023-07-02 LAB — HEPATIC FUNCTION PANEL
ALT: 45 U/L — ABNORMAL HIGH (ref 0–44)
AST: 23 U/L (ref 15–41)
Albumin: 2.4 g/dL — ABNORMAL LOW (ref 3.5–5.0)
Alkaline Phosphatase: 55 U/L (ref 38–126)
Bilirubin, Direct: 0.3 mg/dL — ABNORMAL HIGH (ref 0.0–0.2)
Indirect Bilirubin: 0.9 mg/dL (ref 0.3–0.9)
Total Bilirubin: 1.2 mg/dL (ref 0.0–1.2)
Total Protein: 5.1 g/dL — ABNORMAL LOW (ref 6.5–8.1)

## 2023-07-02 LAB — COOXEMETRY PANEL
Carboxyhemoglobin: 1.9 % — ABNORMAL HIGH (ref 0.5–1.5)
Carboxyhemoglobin: 1.1 % (ref 0.5–1.5)
Carboxyhemoglobin: 1.8 % — ABNORMAL HIGH (ref 0.5–1.5)
Methemoglobin: <0.7 % (ref 0.0–1.5)
Methemoglobin: <0.7 % (ref 0.0–1.5)
Methemoglobin: 0.8 % (ref 0.0–1.5)
O2 Saturation: 69.8 %
O2 Saturation: 48.9 %
O2 Saturation: 50.1 %
Total hemoglobin: 9.6 g/dL — ABNORMAL LOW (ref 12.0–16.0)
Total hemoglobin: 10 g/dL — ABNORMAL LOW (ref 12.0–16.0)
Total hemoglobin: 9.8 g/dL — ABNORMAL LOW (ref 12.0–16.0)

## 2023-07-02 LAB — BASIC METABOLIC PANEL WITH GFR
Anion gap: 9 (ref 5–15)
BUN: 5 mg/dL — ABNORMAL LOW (ref 8–23)
CO2: 28 mmol/L (ref 22–32)
Calcium: 8.2 mg/dL — ABNORMAL LOW (ref 8.9–10.3)
Chloride: 91 mmol/L — ABNORMAL LOW (ref 98–111)
Creatinine, Ser: 0.66 mg/dL (ref 0.44–1.00)
GFR, Estimated: >60 mL/min (ref >60)
Glucose, Bld: 109 mg/dL — ABNORMAL HIGH (ref 70–99)
Potassium: 3.6 mmol/L (ref 3.5–5.1)
Sodium: 128 mmol/L — ABNORMAL LOW (ref 135–145)

## 2023-07-02 LAB — CBC
HCT: 27 % — ABNORMAL LOW (ref 36.0–46.0)
Hemoglobin: 9.1 g/dL — ABNORMAL LOW (ref 12.0–15.0)
MCH: 31.8 pg (ref 26.0–34.0)
MCHC: 33.7 g/dL (ref 30.0–36.0)
MCV: 94.4 fL (ref 80.0–100.0)
Platelets: 76 10*3/uL — ABNORMAL LOW (ref 150–400)
RBC: 2.86 MIL/uL — ABNORMAL LOW (ref 3.87–5.11)
RDW: 13 % (ref 11.5–15.5)
WBC: 8.7 10*3/uL (ref 4.0–10.5)
nRBC: 0 % (ref 0.0–0.2)

## 2023-07-02 LAB — APTT: aPTT: 70 s — ABNORMAL HIGH (ref 24–36)

## 2023-07-02 LAB — LACTATE DEHYDROGENASE: LDH: 300 U/L — ABNORMAL HIGH (ref 98–192)

## 2023-07-02 LAB — MAGNESIUM: Magnesium: 1.6 mg/dL — ABNORMAL LOW (ref 1.7–2.4)

## 2023-07-02 MED ORDER — ASPIRIN 81 MG PO TBEC
81.0000 mg | DELAYED_RELEASE_TABLET | Freq: Every day | ORAL | Status: DC
  Administered 2023-07-03 – 2023-07-14 (×12): 81 mg via ORAL
  Filled 2023-07-02 (×12): qty 1

## 2023-07-02 MED ORDER — POTASSIUM CHLORIDE 10 MEQ/50ML IV SOLN
10.0000 meq | INTRAVENOUS | Status: AC
  Administered 2023-07-02 (×5): 10 meq via INTRAVENOUS
  Filled 2023-07-02 (×5): qty 50

## 2023-07-02 MED ORDER — MAGNESIUM SULFATE 4 GM/100ML IV SOLN
4.0000 g | Freq: Once | INTRAVENOUS | Status: AC
  Administered 2023-07-02: 4 g via INTRAVENOUS
  Filled 2023-07-02: qty 100

## 2023-07-02 MED ORDER — ADULT MULTIVITAMIN W/MINERALS CH
1.0000 | ORAL_TABLET | Freq: Every day | ORAL | Status: DC
  Administered 2023-07-02 – 2023-07-10 (×9): 1 via ORAL
  Filled 2023-07-02 (×12): qty 1

## 2023-07-02 MED ORDER — FENTANYL CITRATE PF 50 MCG/ML IJ SOSY
25.0000 ug | PREFILLED_SYRINGE | Freq: Once | INTRAMUSCULAR | Status: AC
  Administered 2023-07-02: 25 ug via INTRAVENOUS
  Filled 2023-07-02: qty 1

## 2023-07-02 MED ORDER — ENSURE PLUS HIGH PROTEIN PO LIQD
237.0000 mL | Freq: Three times a day (TID) | ORAL | Status: DC
  Administered 2023-07-04: 237 mL via ORAL

## 2023-07-02 MED ORDER — SERTRALINE HCL 100 MG PO TABS
100.0000 mg | ORAL_TABLET | Freq: Every day | ORAL | Status: DC
  Administered 2023-07-02 – 2023-07-14 (×13): 100 mg via ORAL
  Filled 2023-07-02 (×13): qty 1

## 2023-07-02 MED ORDER — MIDAZOLAM HCL 2 MG/2ML IJ SOLN
1.0000 mg | Freq: Once | INTRAMUSCULAR | Status: AC
  Administered 2023-07-02: 1 mg via INTRAVENOUS
  Filled 2023-07-02: qty 2

## 2023-07-02 MED ORDER — FUROSEMIDE 10 MG/ML IJ SOLN
40.0000 mg | Freq: Once | INTRAMUSCULAR | Status: AC
  Administered 2023-07-02: 40 mg via INTRAVENOUS
  Filled 2023-07-02: qty 4

## 2023-07-02 MED ORDER — LIDOCAINE-EPINEPHRINE (PF) 2 %-1:200000 IJ SOLN
INTRAMUSCULAR | Status: AC
  Administered 2023-07-02: 20 mL
  Filled 2023-07-02: qty 20

## 2023-07-02 NOTE — Progress Notes (Signed)
 Initial Nutrition Assessment  DOCUMENTATION CODES:   Obesity unspecified  INTERVENTION:   -Liberalize diet to regular for widest variety of meal selections -D/c Boost Breeze po TID, each supplement provides 250 kcal and 9 grams of protein  -Ensure Plus High Protein po TID, each supplement provides 350 kcal and 20 grams of protein  -MVI with minerals daily -If poor oral intake, may need to consider supplemental enteral nutrition support (ex cortrak)  NUTRITION DIAGNOSIS:   Increased nutrient needs related to acute illness as evidenced by estimated needs.  GOAL:   Patient will meet greater than or equal to 90% of their needs  MONITOR:   PO intake, Supplement acceptance  REASON FOR ASSESSMENT:   Consult Assessment of nutrition requirement/status, Poor PO  ASSESSMENT:   Pt with history of  normal cors on cardiac cath in 2017 and h/o HLD, presenting  with 3 day PTA history of severe chest pain. Found to be in cardiogenic shock w/ hypotension and bradycardia.  Pt admitted with cardiogenic shock secondary to profound RV failure.   6/10- RP impella placed, clear liquid diet 6/14- full liquid diet, heart healthy duet  Reviewed I/O's: -4.5 L x 24 hours and -3.5 L since admission  UOP: 6.6 L x 24 hours  Pt unavailable at time of visit. Attempted to speak with pt via call to hospital room phone, however, unable to reach. RD unable to obtain further nutrition-related history or complete nutrition-focused physical exam at this time.    Per Advanced Heart Failure notes, goals is to remove impella today.   Pt currently on a heart healthy diet with  very poor oral intake. Noted meal completions 0-5%. She is refusing Boost Breeze supplements.  Reviewed wt hx; pt has experienced a 1.4% wt loss over the past 5 months, which is not significant for time frame. Per nursing assessment, pt with mild edema, which may be masking true weight loss as well as fat and muscle depletions.    Medications reviewed and include miralax  and senokot. Bowel regimen added on 06/30/23.  Labs reviewed: Na: 128, Mg: 1.6, CBGS: 112 (inpatient orders for glycemic control are none).    Diet Order:   Diet Order             Diet Heart Room service appropriate? Yes; Fluid consistency: Thin  Diet effective now                   EDUCATION NEEDS:   No education needs have been identified at this time  Skin:  Skin Assessment: Reviewed RN Assessment  Last BM:  Unknown  Height:   Ht Readings from Last 1 Encounters:  06/29/23 5' 3 (1.6 m)    Weight:   Wt Readings from Last 1 Encounters:  07/02/23 78.2 kg    Ideal Body Weight:  52.3 kg  BMI:  Body mass index is 30.54 kg/m.  Estimated Nutritional Needs:   Kcal:  1650-1850  Protein:  85-100 grams  Fluid:  1.6-1.8 L    Herschel Lords, RD, LDN, CDCES Registered Dietitian III Certified Diabetes Care and Education Specialist If unable to reach this RD, please use RD Inpatient group chat on secure chat between hours of 8am-4 pm daily

## 2023-07-02 NOTE — Progress Notes (Signed)
 Advanced Heart Failure Rounding Note   Subjective:    Remains on RP-Impella now at P-6 with 2.7L flow   Now on DBA 5 NE 2 and milrinone 0.125  Diuresed > 5L with IV lasix overnight  In/out AFL   Swan  CVP 6 PA 22/13 (18) PAPi 1.5 Co-ox 70% Fick 6.6/3.2   Objective:   Weight Range:  Vital Signs:   Temp:  [98.6 F (37 C)-100.8 F (38.2 C)] 98.6 F (37 C) (06/15 1100) Pulse Rate:  [51-153] 53 (06/15 1100) Resp:  [8-27] 13 (06/15 1100) SpO2:  [90 %-100 %] 97 % (06/15 1100) Arterial Line BP: (86-166)/(45-80) 97/47 (06/15 1100) Weight:  [78.2 kg] 78.2 kg (06/15 0500) Last BM Date :  (PTA)  Weight change: Filed Weights   06/30/23 0615 07/01/23 0500 07/02/23 0500  Weight: 82.2 kg 81.2 kg 78.2 kg    Intake/Output:   Intake/Output Summary (Last 24 hours) at 07/02/2023 1115 Last data filed at 07/02/2023 1100 Gross per 24 hour  Intake 2165.12 ml  Output 5700 ml  Net -3534.88 ml     PHYSICAL EXAM: General:  Sitting up in bed  HEENT: normal Neck: supple. RIJ Impella. LIJ swan WUJ:WJXBJYN rate & rhythm. No rubs, gallops or murmurs. Lungs: coarse Abdomen: soft, nontender, nondistended. No hepatosplenomegaly. No bruits or masses. Good bowel sounds. Extremities: no cyanosis, clubbing, rash, edema Neuro: alert & orientedx3, cranial nerves grossly intact. moves all 4 extremities w/o difficulty. Affect pleasant  Telemetry: SB in 50s with periods of AF/AFL Personally reviewed   Labs: Basic Metabolic Panel: Recent Labs  Lab 06/27/23 2232 06/28/23 0444 06/29/23 0601 06/29/23 0609 06/30/23 0424 06/30/23 0429 06/30/23 0504 07/01/23 0507 07/01/23 1249 07/02/23 0434  NA 129*   < > 129*   < > 125*  --  127* 127* 129* 128*  K 4.2   < > 3.8   < > 4.7  --  4.4 4.2 3.8 3.6  CL 99   < > 97*  --  96*  --   --  95* 95* 91*  CO2 22   < > 23  --  23  --   --  23 25 28   GLUCOSE 118*   < > 149*  --  120*  --   --  110* 112* 109*  BUN 29*   < > 14  --  12  --   --  9 7*  5*  CREATININE 1.18*   < > 0.77  --  0.59  --   --  0.60 0.63 0.66  CALCIUM 8.1*   < > 8.0*  --  8.0*  --   --  8.3* 8.4* 8.2*  MG 1.6*  --  2.1  --   --  2.0  --   --   --  1.6*   < > = values in this interval not displayed.    Liver Function Tests: Recent Labs  Lab 06/27/23 2232 06/29/23 0601 06/30/23 0424 07/01/23 0507 07/02/23 0434  AST 131* 97* 54* 33 23  ALT 124* 109* 85* 62* 45*  ALKPHOS 57 47 52 54 55  BILITOT 0.8 1.5* 1.2 1.2 1.2  PROT 5.3* 5.2* 4.9* 5.1* 5.1*  ALBUMIN 3.0* 2.7* 2.4* 2.5* 2.4*   No results for input(s): LIPASE, AMYLASE in the last 168 hours. No results for input(s): AMMONIA in the last 168 hours.  CBC: Recent Labs  Lab 06/27/23 1020 06/27/23 1034 06/27/23 1629 06/27/23 1700 06/29/23 0601 06/29/23 0609 06/30/23  0424 06/30/23 0504 06/30/23 1707 07/01/23 0507 07/02/23 0434  WBC 15.6*  --  21.0*   < > 12.3*  --  8.5  --  8.4 7.5 8.7  NEUTROABS 11.7*  --  16.6*  --   --   --   --   --   --   --   --   HGB 13.1   < > 13.3   < > 10.6*   < > 9.3* 8.2* 9.2* 9.3* 9.1*  HCT 39.8   < > 38.4   < > 31.4*   < > 27.6* 24.0* 26.8* 27.3* 27.0*  MCV 97.3  --  94.6   < > 94.3  --  96.2  --  94.7 94.8 94.4  PLT 153  --  168   < > 73*  --  56*  --  65* 64* 76*   < > = values in this interval not displayed.    Cardiac Enzymes: No results for input(s): CKTOTAL, CKMB, CKMBINDEX, TROPONINI in the last 168 hours.  BNP: BNP (last 3 results) Recent Labs    06/27/23 1020  BNP 543.0*    ProBNP (last 3 results) No results for input(s): PROBNP in the last 8760 hours.    Other results:  Imaging: DG CHEST PORT 1 VIEW Result Date: 07/02/2023 CLINICAL DATA:  Heart failure. EXAM: PORTABLE CHEST 1 VIEW COMPARISON:  06/30/2023 FINDINGS: The cardio pericardial silhouette is enlarged. Right IJ intra-aortic balloon pump and left IJ pulmonary artery catheter are stable in position. Asymmetric elevation right hemidiaphragm again noted. Persistent  basilar atelectasis with probable bilateral effusions. Telemetry leads overlie the chest. IMPRESSION: Persistent basilar atelectasis with probable bilateral effusions. Stable position of support apparatus. Electronically Signed   By: Donnal Fusi M.D.   On: 07/02/2023 07:44     Medications:     Scheduled Medications:  aspirin   81 mg Oral Daily   Chlorhexidine  Gluconate Cloth  6 each Topical Daily   feeding supplement  1 Container Oral TID BM   levothyroxine   50 mcg Oral Daily   lidocaine   1 patch Transdermal Q24H   polyethylene glycol  17 g Oral BID   senna  1 tablet Oral QHS   sodium chloride  flush  3 mL Intravenous Q12H    Infusions:  sodium chloride  Stopped (07/02/23 1055)   amiodarone     bivalirudin (ANGIOMAX) 250 mg in sodium chloride  0.9 % 500 mL (0.5 mg/mL) infusion 0.07 mg/kg/hr (07/02/23 1100)   DOBUTamine  5 mcg/kg/min (07/02/23 1100)   magnesium  sulfate bolus IVPB 50 mL/hr at 07/02/23 1100   milrinone 0.125 mcg/kg/min (07/02/23 1100)   norepinephrine  (LEVOPHED ) Adult infusion 2 mcg/min (07/02/23 1100)   potassium chloride  50 mL/hr at 07/02/23 1100   promethazine  (PHENERGAN ) injection (IM or IVPB) Stopped (07/01/23 1026)   sodium bicarbonate  25 mEq (Impella PURGE) in dextrose  5 % 1000 mL bag      PRN Medications: sodium chloride , sodium chloride , acetaminophen , HYDROmorphone  (DILAUDID ) injection, hydrOXYzine , lip balm, ondansetron  (ZOFRAN ) IV, mouth rinse, oxyCODONE , promethazine  (PHENERGAN ) injection (IM or IVPB), sodium chloride  flush, zolpidem    Assessment/Plan:   1. Cardiogenic Shock, 2/2 Profound RV Failure   - cath 06/27/23 normal left-dominant system with non-visualized tiny RCA - CT no PE - Echo LVEF 60-65% RV moderate HK with McConnell's sign - s/p Impella RP placement - POCUS ECHO 6/12 w/ improvement LVEF 60% RV mild HK  - Remains on Impella at P-6 Flow 2.7L  Waveforms look good. LDH 370-> 300 - Now  on DBA 5 NE 2 and milrinone 0.125 - Volume status  much improved after brisk diuresis over night - will attempt to wean Impella today  2. Acute diastolic HF - improving with diuresis  3. NSTEMI with RV infarct - plan as above - continue ASA/statin - No b-blocker with shock  4. Junctional rhythm with pauses - improved w/ DBA - continue to monitor  5. PAF/AFL - intermittent - holding off on amio due to periods of junctional rhythm in setting of RV infarct. May need to consider - Continue bival   6. 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 Shock Liver - in setting of RV failure  -  improved - continue supportive care   8. AKI  - Initial SCr 1.4 due to ATN/shock - resolved   9. Hyponatremia - due to HF. Na 127 -> 128 - restrict FW  10. 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 No bleeding    CRITICAL CARE Performed by: Jules Oar  Total critical care time: 50 minutes  Critical care time was exclusive of separately billable procedures and treating other patients.  Critical care was necessary to treat or prevent imminent or life-threatening deterioration.  Critical care was time spent personally by me (independent of midlevel providers or residents) on the following activities: development of treatment plan with patient and/or surrogate as well as nursing, discussions with consultants, evaluation of patient's response to treatment, examination of patient, obtaining history from patient or surrogate, ordering and performing treatments and interventions, ordering and review of laboratory studies, ordering and review of radiographic studies, pulse oximetry and re-evaluation of patient's condition.    Length of Stay: 5   Jules Oar MD  07/02/2023, 11:15 AM  Advanced Heart Failure  Team Pager [phone] (M-F; 7a - 4p)  Please contact [facility] for night-coverage after hours (4p -7a ) and weekends on [email or web address]

## 2023-07-02 NOTE — CV Procedure (Signed)
   Impella weaning trial performed. Bival held  Right neck prepped sterilely.   Sutures removed.   Two fresh pursestring sutures places around insertion site.   Impella turned to P-0. Impella device and sheath pulled without incident. Sutures locked down. Manual pressure held for 10 mins with good hemostasis.   A CHG dressing was placed.    Jules Oar, MD  3:17 PM

## 2023-07-02 NOTE — Plan of Care (Signed)
  Problem: Education: Goal: Knowledge of General Education information will improve Description: Including pain rating scale, medication(s)/side effects and non-pharmacologic comfort measures 07/02/2023 0313 by Charlann Confer, RN Outcome: Progressing 07/02/2023 0313 by Charlann Confer, RN Outcome: Progressing   Problem: Health Behavior/Discharge Planning: Goal: Ability to manage health-related needs will improve 07/02/2023 0313 by Charlann Confer, RN Outcome: Progressing 07/02/2023 0313 by Charlann Confer, RN Outcome: Progressing   Problem: Clinical Measurements: Goal: Ability to maintain clinical measurements within normal limits will improve 07/02/2023 0313 by Charlann Confer, RN Outcome: Progressing 07/02/2023 0313 by Charlann Confer, RN Outcome: Progressing Goal: Will remain free from infection 07/02/2023 0313 by Charlann Confer, RN Outcome: Progressing 07/02/2023 0313 by Charlann Confer, RN Outcome: Progressing Goal: Diagnostic test results will improve 07/02/2023 0313 by Charlann Confer, RN Outcome: Progressing 07/02/2023 0313 by Charlann Confer, RN Outcome: Progressing Goal: Respiratory complications will improve 07/02/2023 0313 by Charlann Confer, RN Outcome: Progressing 07/02/2023 0313 by Charlann Confer, RN Outcome: Progressing Goal: Cardiovascular complication will be avoided 07/02/2023 0313 by Charlann Confer, RN Outcome: Progressing 07/02/2023 0313 by Charlann Confer, RN Outcome: Progressing   Problem: Activity: Goal: Risk for activity intolerance will decrease 07/02/2023 0313 by Charlann Confer, RN Outcome: Progressing 07/02/2023 0313 by Charlann Confer, RN Outcome: Progressing   Problem: Nutrition: Goal: Adequate nutrition will be maintained 07/02/2023 0313 by Charlann Confer, RN Outcome: Progressing 07/02/2023 0313 by Charlann Confer, RN Outcome: Progressing   Problem: Coping: Goal: Level of anxiety will decrease 07/02/2023 0313 by Charlann Confer, RN Outcome:  Progressing 07/02/2023 0313 by Charlann Confer, RN Outcome: Progressing   Problem: Elimination: Goal: Will not experience complications related to bowel motility 07/02/2023 0313 by Charlann Confer, RN Outcome: Progressing 07/02/2023 0313 by Charlann Confer, RN Outcome: Progressing Goal: Will not experience complications related to urinary retention 07/02/2023 0313 by Charlann Confer, RN Outcome: Progressing 07/02/2023 0313 by Charlann Confer, RN Outcome: Progressing   Problem: Pain Managment: Goal: General experience of comfort will improve and/or be controlled 07/02/2023 0313 by Charlann Confer, RN Outcome: Progressing 07/02/2023 0313 by Charlann Confer, RN Outcome: Progressing   Problem: Safety: Goal: Ability to remain free from injury will improve 07/02/2023 0313 by Charlann Confer, RN Outcome: Progressing 07/02/2023 0313 by Charlann Confer, RN Outcome: Progressing   Problem: Skin Integrity: Goal: Risk for impaired skin integrity will decrease 07/02/2023 0313 by Charlann Confer, RN Outcome: Progressing 07/02/2023 0313 by Charlann Confer, RN Outcome: Progressing   Problem: Cardiac: Goal: Ability to achieve and maintain adequate cardiopulmonary perfusion will improve 07/02/2023 0313 by Charlann Confer, RN Outcome: Progressing 07/02/2023 0313 by Charlann Confer, RN Outcome: Progressing Goal: Vascular access site(s) Level 0-1 will be maintained 07/02/2023 0313 by Charlann Confer, RN Outcome: Progressing 07/02/2023 0313 by Charlann Confer, RN Outcome: Progressing   Problem: Fluid Volume: Goal: Ability to achieve a balanced intake and output will improve 07/02/2023 0313 by Charlann Confer, RN Outcome: Progressing 07/02/2023 0313 by Charlann Confer, RN Outcome: Progressing   Problem: Physical Regulation: Goal: Complications related to the disease process, condition or treatment will be avoided or minimized 07/02/2023 0313 by Charlann Confer, RN Outcome: Progressing 07/02/2023  0313 by Charlann Confer, RN Outcome: Progressing   Problem: Respiratory: Goal: Will regain and/or maintain adequate ventilation 07/02/2023 0313 by Charlann Confer, RN Outcome: Progressing 07/02/2023 0313 by Charlann Confer, RN Outcome: Progressing

## 2023-07-02 NOTE — Progress Notes (Addendum)
 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   Hydrocodone-Acetaminophen      Other Reaction(s): migraines   Peanut (Diagnostic)     Other Reaction(s): itchy   Shellfish Allergy Other (See Comments)    Throat swelling   Statins     Patient Measurements: Height: 5' 3 (160 cm) Weight: 78.2 kg (172 lb 6.4 oz) IBW/kg (Calculated) : 52.4 Heparin  Dosing Weight: 68 kg   Vital Signs: Temp: 100 F (37.8 C) (06/15 0900) Temp Source: Core (06/15 0800) Pulse Rate: 53 (06/15 0900)  Labs: Recent Labs     0000 06/29/23 1159 06/29/23 2249 06/30/23 0424 06/30/23 0504 06/30/23 1707 06/30/23 2008 07/01/23 0053 07/01/23 0507 07/01/23 1249 07/02/23 0434  HGB  --   --   --  9.3*   < > 9.2*  --   --  9.3*  --  9.1*  HCT  --   --   --  27.6*   < > 26.8*  --   --  27.3*  --  27.0*  PLT   < >  --   --  56*  --  65*  --   --  64*  --  76*  APTT  --   --   --   --    < >  --    < > 70* 62*  --  70*  HEPARINUNFRC  --  <0.10* 0.24* 0.24*  --   --   --   --   --   --   --   CREATININE   < >  --   --  0.59  --   --   --   --  0.60 0.63 0.66   < > = values in this interval not displayed.    Estimated Creatinine Clearance: 62.9 mL/min (by C-G formula based on SCr of 0.66 mg/dL).  Medical History: Past Medical History:  Diagnosis Date   Alcohol intoxication (HCC) 05/2014    ED note    Anxiety    Arthritis    Boils    Chronic back pain    Community acquired pneumonia 01/05/2016   Depression    Fibromyalgia     GERD (gastroesophageal reflux disease)    Heart attack (HCC)    Hepatic cyst 01/06/2016   Hyperlipidemia    Hypertension    Insomnia    Leg cramps    Migraines    with aura   Mini stroke 05/2014   with paralysis for 1 hr   Osteopenia    PONV (postoperative nausea and vomiting)    severe   Postmenopausal bleeding 12/2015   PTSD (post-traumatic stress disorder)    S/P epidural steroid injection    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  Not on anticoagulation prior to admission.  Pharmacy consulted for heparin  dosing.  -Chest CT w/ Type B dissection (small ulcerated plaque/developing intramural hematoma ~8 mm x 14 mm) -HIT Ab positive, SRA (ip) -aPTT 70 sec, upper end of therapeutic.  Hgb 9.1 stable, pltc 76 up some today. LDH 300 trending down.   Goal of Therapy:  aPTT 50-70 seconds Monitor platelets by anticoagulation protocol: Yes   Plan:  Continue bicarb Impella purge Decrease bivalirudin IV to 0.07 mg/kg/hr 4h aPTT Daily CBC, aPTT  Monitor for signs/symptoms of bleeding  Cecillia Cogan, PharmD Clinical Pharmacist 07/02/2023  9:11 AM

## 2023-07-02 NOTE — Progress Notes (Signed)
   Impella weaning trial   Impella RP turned dow from P-6 to P-3.  Co-ox dropped 70% -> 41% initially. NE increased. Co-ox up to 53%. CVP stable at 6-8.   Echo performed personally and RV looked good.   Monitored at P-3 for over 30 mins and clinically stable.   Decision made to pull Impella.   Impella turned back to P-6. Bival held x 30 then impella pulled without incident. (See separate procedure note)    CRITICAL CARE Performed by: Jules Oar  Total critical care time: 45 minutes  Critical care time was exclusive of separately billable procedures and treating other patients.  Critical care was necessary to treat or prevent imminent or life-threatening deterioration.  Critical care was time spent personally by me (independent of midlevel providers or residents) on the following activities: development of treatment plan with patient and/or surrogate as well as nursing, discussions with consultants, evaluation of patient's response to treatment, examination of patient, obtaining history from patient or surrogate, ordering and performing treatments and interventions, ordering and review of laboratory studies, ordering and review of radiographic studies, pulse oximetry and re-evaluation of patient's condition.   Jules Oar, MD  3:15 PM

## 2023-07-03 DIAGNOSIS — R570 Cardiogenic shock: Secondary | ICD-10-CM | POA: Diagnosis not present

## 2023-07-03 LAB — CBC
HCT: 27.4 % — ABNORMAL LOW (ref 36.0–46.0)
Hemoglobin: 9.4 g/dL — ABNORMAL LOW (ref 12.0–15.0)
MCH: 32.5 pg (ref 26.0–34.0)
MCHC: 34.3 g/dL (ref 30.0–36.0)
MCV: 94.8 fL (ref 80.0–100.0)
Platelets: 91 10*3/uL — ABNORMAL LOW (ref 150–400)
RBC: 2.89 MIL/uL — ABNORMAL LOW (ref 3.87–5.11)
RDW: 12.8 % (ref 11.5–15.5)
WBC: 8.8 10*3/uL (ref 4.0–10.5)
nRBC: 0 % (ref 0.0–0.2)

## 2023-07-03 LAB — BASIC METABOLIC PANEL WITH GFR
Anion gap: 10 (ref 5–15)
BUN: 7 mg/dL — ABNORMAL LOW (ref 8–23)
CO2: 29 mmol/L (ref 22–32)
Calcium: 8.5 mg/dL — ABNORMAL LOW (ref 8.9–10.3)
Chloride: 87 mmol/L — ABNORMAL LOW (ref 98–111)
Creatinine, Ser: 0.68 mg/dL (ref 0.44–1.00)
GFR, Estimated: >60 mL/min (ref >60)
Glucose, Bld: 109 mg/dL — ABNORMAL HIGH (ref 70–99)
Potassium: 3.9 mmol/L (ref 3.5–5.1)
Sodium: 126 mmol/L — ABNORMAL LOW (ref 135–145)

## 2023-07-03 LAB — COOXEMETRY PANEL
Carboxyhemoglobin: 1.9 % — ABNORMAL HIGH (ref 0.5–1.5)
Carboxyhemoglobin: 2 % — ABNORMAL HIGH (ref 0.5–1.5)
Carboxyhemoglobin: 1.7 % — ABNORMAL HIGH (ref 0.5–1.5)
Methemoglobin: 0.7 % (ref 0.0–1.5)
Methemoglobin: 1.4 % (ref 0.0–1.5)
Methemoglobin: 0.7 % (ref 0.0–1.5)
O2 Saturation: 74.5 %
O2 Saturation: 69.2 %
O2 Saturation: 64.1 %
Total hemoglobin: 9.9 g/dL — ABNORMAL LOW (ref 12.0–16.0)
Total hemoglobin: 9.6 g/dL — ABNORMAL LOW (ref 12.0–16.0)
Total hemoglobin: 10 g/dL — ABNORMAL LOW (ref 12.0–16.0)

## 2023-07-03 LAB — APTT
aPTT: 65 s — ABNORMAL HIGH (ref 24–36)
aPTT: 71 s — ABNORMAL HIGH (ref 24–36)

## 2023-07-03 LAB — MAGNESIUM: Magnesium: 2 mg/dL (ref 1.7–2.4)

## 2023-07-03 LAB — LACTATE DEHYDROGENASE: LDH: 263 U/L — ABNORMAL HIGH (ref 98–192)

## 2023-07-03 MED ORDER — FUROSEMIDE 10 MG/ML IJ SOLN
40.0000 mg | Freq: Once | INTRAMUSCULAR | Status: AC
  Administered 2023-07-03: 40 mg via INTRAVENOUS
  Filled 2023-07-03: qty 4

## 2023-07-03 MED ORDER — SODIUM CHLORIDE 0.9 % IV SOLN
0.0700 mg/kg/h | INTRAVENOUS | Status: DC
  Administered 2023-07-03: 0.07 mg/kg/h via INTRAVENOUS
  Filled 2023-07-03: qty 250

## 2023-07-03 MED ORDER — POTASSIUM CHLORIDE CRYS ER 20 MEQ PO TBCR
40.0000 meq | EXTENDED_RELEASE_TABLET | Freq: Once | ORAL | Status: AC
  Administered 2023-07-03: 40 meq via ORAL
  Filled 2023-07-03: qty 2

## 2023-07-03 MED ORDER — MAGNESIUM SULFATE 2 GM/50ML IV SOLN
2.0000 g | Freq: Once | INTRAVENOUS | Status: AC
  Administered 2023-07-03: 2 g via INTRAVENOUS
  Filled 2023-07-03: qty 50

## 2023-07-03 MED ORDER — AMIODARONE HCL IN DEXTROSE 360-4.14 MG/200ML-% IV SOLN
60.0000 mg/h | INTRAVENOUS | Status: DC
  Administered 2023-07-03 (×2): 60 mg/h via INTRAVENOUS
  Filled 2023-07-03 (×2): qty 200

## 2023-07-03 MED ORDER — AMIODARONE LOAD VIA INFUSION
150.0000 mg | Freq: Once | INTRAVENOUS | Status: AC
  Administered 2023-07-03: 150 mg via INTRAVENOUS
  Filled 2023-07-03: qty 83.34

## 2023-07-03 MED ORDER — AMIODARONE HCL IN DEXTROSE 360-4.14 MG/200ML-% IV SOLN: 30.0000 mg/h | INTRAVENOUS | Status: DC

## 2023-07-03 MED ORDER — SORBITOL 70 % SOLN
30.0000 mL | Freq: Once | Status: AC
  Administered 2023-07-03: 30 mL via ORAL
  Filled 2023-07-03: qty 30

## 2023-07-03 MED ORDER — AMIODARONE HCL IN DEXTROSE 360-4.14 MG/200ML-% IV SOLN
30.0000 mg/h | INTRAVENOUS | Status: DC
  Administered 2023-07-03 – 2023-07-04 (×2): 30 mg/h via INTRAVENOUS
  Administered 2023-07-04 (×2): 60 mg/h via INTRAVENOUS
  Administered 2023-07-05: 30 mg/h via INTRAVENOUS
  Filled 2023-07-03 (×5): qty 200

## 2023-07-03 NOTE — Progress Notes (Signed)
 I assisted during impella placement with hemodynamic monitoring, adjustment of pressors and inotropes. Ms. Kreitz was critically ill during this time with a rapidly rising lactic acid. Impella placement went smoothly by Dr. Julane Ny. Pressors were slowly weaned back down towards the end of the case with an improvement in lactic acid.   Vicki [name] 1:21 PM   CC TIME 1 hour

## 2023-07-03 NOTE — Progress Notes (Signed)
 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   Hydrocodone-Acetaminophen      Other Reaction(s): migraines   Peanut (Diagnostic)     Other Reaction(s): itchy   Shellfish Allergy Other (See Comments)    Throat swelling   Statins     Patient Measurements: Height: 5' 3 (160 cm) Weight: 78.5 kg (173 lb 1 oz) IBW/kg (Calculated) : 52.4 Heparin  Dosing Weight: 68 kg   Vital Signs: Temp: 100.4 F (38 C) (06/16 0815) Temp Source: Core (06/16 0400) BP: 118/75 (06/16 0815) Pulse Rate: 90 (06/16 0815)  Labs: Recent Labs    06/30/23 1707 07/01/23 0053 07/01/23 0507 07/01/23 1249 07/02/23 0434 07/02/23 1144 07/02/23 1201 07/02/23 1226 07/03/23 0413  HGB  --   --  9.3*  --  9.1*   < > 8.8* 9.2* 9.4*  HCT  --   --  27.3*  --  27.0*   < > 26.0* 27.0* 27.4*  PLT  --   --  64*  --  76*  --   --   --  91*  APTT  --  70* 62*  --  70*  --   --   --   --   CREATININE   < >  --  0.60 0.63 0.66  --   --   --  0.68   < > = values in this interval not displayed.    Estimated Creatinine Clearance: 63 mL/min (by C-G formula based on SCr of 0.68 mg/dL).  Medical History: Past Medical History:  Diagnosis Date   Alcohol intoxication (HCC) 05/2014    ED note    Anxiety    Arthritis    Boils    Chronic back pain    Community acquired pneumonia 01/05/2016   Depression    Fibromyalgia    GERD (gastroesophageal reflux disease)    Heart attack (HCC)    Hepatic cyst 01/06/2016   Hyperlipidemia     Hypertension    Insomnia    Leg cramps    Migraines    with aura   Mini stroke 05/2014   with paralysis for 1 hr   Osteopenia    PONV (postoperative nausea and vomiting)    severe   Postmenopausal bleeding 12/2015   PTSD (post-traumatic stress disorder)    S/P epidural steroid injection    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  Not on anticoagulation prior to admission.  Pharmacy consulted for heparin  dosing.  Chest CT w/ Type B dissection (small ulcerated plaque/developing intramural hematoma ~  8 mm x 14 mm). HIT Ab positive, SRA (ip). Hgb 9.4, plt trending up to 91. LDH stable at 263.   Was previously on bivalirudin at 0.07 mg/kg/hr - stopped at Impella RP removed. Now flipped in Afib so will plan to restart anticoag.   Goal of Therapy:  aPTT 50-70 seconds Monitor platelets by anticoagulation protocol: Yes   Plan:  Restart bivalirudin IV at 0.07 mg/kg/hr (will use new weight for dosing weight of 78.5 kg, previously 82.2 kg) 4h aPTT Daily CBC, aPTT, SRA Monitor for signs/symptoms of bleeding  Thank you for allowing pharmacy to participate in this patient's care,  Nieves Bars, PharmD, BCCCP Clinical Pharmacist  Phone: [phone] 07/03/2023 9:18 AM  Please check AMION for all [facility] phone numbers After 10:00 PM, call Main Pharmacy [phone]

## 2023-07-03 NOTE — Evaluation (Signed)
 Occupational Therapy Evaluation Patient Details Name: Vicki [name] MRN: [id number] DOB: 1950/03/09 Today's Date: 07/03/2023   History of Present Illness   73 y/o female presenting to the ED 06/27/23 w/ 3 day h/o severe chest pain. In cardiogenic shock due to RV failure, hypotensive, bradycardia. R and L heart cath; Chest CT ulcerated plaque in descending aorta; Impella 6/10-6/15; PMH-chronic back pain, MI, HLD, HTN, migraines, TIA, CVAx3, osteopenia, PTSD,     Clinical Impressions Pt is typically independent in mobility, ADLs and IADLs. She reports not driving in some time and she and her husband order their groceries. Pt presents with impaired cognition, reports she is here due to a fall in which she hurt he back and her hip. She responded to questions about home set up with inconsistencies when compared to prior admission. Pt readily willing to get OOB to chair, currently limited by swan-ganz. She came to EOB with CGA and transferred with B hand held and min assist. HR to 160 in afib after transfer. Pt requires set up to max assist for ADLs. Recommending HHOT. DME needs to be determined as pt progresses.      If plan is discharge home, recommend the following:   A little help with walking and/or transfers;A lot of help with bathing/dressing/bathroom;Assistance with cooking/housework;Direct supervision/assist for medications management;Direct supervision/assist for financial management;Assist for transportation;Help with stairs or ramp for entrance     Functional Status Assessment   Patient has had a recent decline in their functional status and demonstrates the ability to make significant improvements in function in a reasonable and predictable amount of time.     Equipment Recommendations   Other (comment) (TBD)     Recommendations for Other Services         Precautions/Restrictions   Precautions Precautions: Fall Recall of Precautions/Restrictions:  Impaired Restrictions Weight Bearing Restrictions Per Provider Order: No     Mobility Bed Mobility Overal bed mobility: Needs Assistance Bed Mobility: Supine to Sit     Supine to sit: Contact guard, +2 for safety/equipment, HOB elevated     General bed mobility comments: pt with multiple lines including Swan    Transfers Overall transfer level: Needs assistance Equipment used: 2 person hand held assist Transfers: Sit to/from Stand, Bed to chair/wheelchair/BSC Sit to Stand: Min assist, +2 physical assistance, +2 safety/equipment     Step pivot transfers: Min assist, +2 physical assistance, +2 safety/equipment     General transfer comment: limited to OOB to chair due to [place]; pt stood well and took several steps around to chair with increased time      Balance Overall balance assessment: Needs assistance   Sitting balance-Leahy Scale: Fair     Standing balance support: Bilateral upper extremity supported, During functional activity Standing balance-Leahy Scale: Poor                             ADL either performed or assessed with clinical judgement   ADL Overall ADL's : Needs assistance/impaired Eating/Feeding: Set up;Sitting   Grooming: Supervision/safety;Sitting   Upper Body Bathing: Minimal assistance;Sitting   Lower Body Bathing: Maximal assistance;Sit to/from stand   Upper Body Dressing : Minimal assistance;Sitting   Lower Body Dressing: Maximal assistance;Sit to/from stand   Toilet Transfer: Minimal assistance;+2 for physical assistance;+2 for safety/equipment                   Vision Ability to See in Adequate Light: 0 Adequate Patient Visual  Report: No change from baseline Additional Comments: I try not to use any glasses.     Perception         Praxis         Pertinent Vitals/Pain Pain Assessment Pain Assessment: No/denies pain     Extremity/Trunk Assessment Upper Extremity Assessment Upper Extremity Assessment:  Generalized weakness   Lower Extremity Assessment Lower Extremity Assessment: Defer to PT evaluation   Cervical / Trunk Assessment Cervical / Trunk Assessment: Normal   Communication Communication Communication: No apparent difficulties   Cognition Arousal: Alert Behavior During Therapy: WFL for tasks assessed/performed Cognition: Cognition impaired   Orientation impairments: Time, Situation Awareness: Intellectual awareness impaired, Online awareness impaired Memory impairment (select all impairments): Short-term memory, Working [employer] fast streamer, Non-declarative long-term memory, Armed forces training and education officer functioning impairment (select all impairments): Sequencing, Problem solving                   Following commands: Impaired Following commands impaired: Follows one step commands with increased time     Cueing  General Comments   Cueing Techniques: Verbal cues      Exercises     Shoulder Instructions      Home Living Family/patient expects to be discharged to:: Private residence Living Arrangements: Spouse/significant other Available Help at Discharge: Family Type of Home: House Home Access: Stairs to enter [employer] of Steps: 3 (from previous medical record) Entrance Stairs-Rails: Right Home Layout: Able to live on main level with bedroom/bathroom;Laundry or work area in basement     [employer] Shower/Tub: Chief Strategy Officer: Standard     Home Equipment: [employer] - single point   Additional Comments: Information from previous medical record as pt confused (reporting 17 steps to enter home)      Prior Functioning/Environment Prior Level of Function : Needs assist             Mobility Comments: no device ADLs Comments: gets down into tub; orders groceries online, has not been driving    OT Problem List: Decreased strength;Impaired balance (sitting and/or standing);Decreased cognition;Decreased knowledge of use of  DME or AE;Cardiopulmonary status limiting activity   OT Treatment/Interventions: Self-care/ADL training;DME and/or AE instruction;Therapeutic activities;Patient/family education;Cognitive remediation/compensation;Balance training      OT Goals(Current goals can be found in the care plan section)   Acute Rehab OT Goals OT Goal Formulation: With patient Time For Goal Achievement: 07/17/23 Potential to Achieve Goals: Good ADL Goals Pt Will Perform Grooming: with supervision;standing Pt Will Perform Upper Body Dressing: with set-up;sitting Pt Will Perform Lower Body Dressing: with supervision;sit to/from stand Pt Will Transfer to Toilet: with supervision;ambulating;regular height toilet Pt Will Perform Toileting - Clothing Manipulation and hygiene: with supervision;sit to/from stand Additional ADL Goal #1: Pt will correctly respond to orientation questions accurately.   OT Frequency:  Min 2X/week    Co-evaluation PT/OT/SLP Co-Evaluation/Treatment: Yes Reason for Co-Treatment: Complexity of the patient's impairments (multi-system involvement);For patient/therapist safety;To address functional/ADL transfers PT goals addressed during session: Mobility/safety with mobility;Balance OT goals addressed during session: ADL's and self-care      AM-PAC OT 6 Clicks Daily Activity     Outcome Measure Help from another person eating meals?: None Help from another person taking care of personal grooming?: A Little Help from another person toileting, which includes using toliet, bedpan, or urinal?: A Lot Help from another person bathing (including washing, rinsing, drying)?: A Lot Help from another person to put on and taking off regular upper body clothing?: A Little  Help from another person to put on and taking off regular lower body clothing?: A Lot 6 Click Score: 16   End of Session Equipment Utilized During Treatment: Oxygen Nurse Communication: Mobility status  Activity Tolerance:  Treatment limited secondary to medical complications (Comment) (pt with swan-ganz) Patient left: in chair;with call bell/phone within reach;with nursing/sitter in room  OT Visit Diagnosis: Unsteadiness on feet (R26.81);Muscle weakness (generalized) (M62.81);Other symptoms and signs involving cognitive function                Time: 3664-4034 OT Time Calculation (min): 23 min Charges:  OT General Charges $OT Visit: 1 Visit OT Evaluation $OT Eval Moderate Complexity: 1 Mod  [name], OTR/L Acute Rehabilitation Services Office: [phone]   Jonette Nestle 07/03/2023, 12:14 PM

## 2023-07-03 NOTE — Progress Notes (Signed)
 Advanced Heart Failure Rounding Note  Chief Complaint: RV Failure Subjective:    6/15: RP Impella removed.  On DBA 7.5 + Milrinone 0.125 + NE 1 In/out of Afib overnight. Currently with rates in 120-130s.   Swan#s: PAP: 26/18 (22) CVP: 8 CO: 4.43 CI: 2.52  Sitting up in the chair. Feeling okay this morning. No SOB.   Objective:    Vital Signs:   Temp:  [88.5 F (31.4 C)-100.2 F (37.9 C)] 99.7 F (37.6 C) (06/16 0630) Pulse Rate:  [48-121] 97 (06/16 0630) Resp:  [8-29] 19 (06/16 0630) BP: (91-130)/(53-78) 115/67 (06/16 0630) SpO2:  [90 %-100 %] 95 % (06/16 0630) Arterial Line BP: (82-168)/(45-78) 108/66 (06/16 0100) Weight:  [78.5 kg] 78.5 kg (06/16 0500) Last BM Date :  (PTA)  Weight change: Filed Weights   07/01/23 0500 07/02/23 0500 07/03/23 0500  Weight: 81.2 kg 78.2 kg 78.5 kg   Intake/Output:  Intake/Output Summary (Last 24 hours) at 07/03/2023 0730 Last data filed at 07/03/2023 0600 Gross per 24 hour  Intake 1546.26 ml  Output 2050 ml  Net -503.74 ml    PHYSICAL EXAM: CVP 8 General: Weak appearing. No distress on [place] Cardiac: S1 and S2 present. No murmurs or rub. Extremities: Warm and dry.  Trace BLE edema.  Neuro: Alert and oriented x3. Affect pleasant.  Lines/Devices:  LIJ introducer + PAC  Telemetry: Afib 120-130s (personally reviewed) In/out overnight.   Labs: Basic Metabolic Panel: Recent Labs  Lab 06/27/23 2232 06/28/23 0444 06/29/23 0601 06/29/23 0609 06/30/23 0424 06/30/23 0429 06/30/23 0504 07/01/23 0507 07/01/23 1249 07/02/23 0434 07/02/23 1144 07/02/23 1201 07/02/23 1226 07/03/23 0413  NA 129*   < > 129*   < > 125*  --    < > 127* 129* 128* 126* 126* 125* 126*  K 4.2   < > 3.8   < > 4.7  --    < > 4.2 3.8 3.6 4.6 4.4 4.5 3.9  CL 99   < > 97*  --  96*  --   --  95* 95* 91*  --   --   --  87*  CO2 22   < > 23  --  23  --   --  23 25 28   --   --   --  29  GLUCOSE 118*   < > 149*  --  120*  --   --  110* 112* 109*  --   --    --  109*  BUN 29*   < > 14  --  12  --   --  9 7* 5*  --   --   --  7*  CREATININE 1.18*   < > 0.77  --  0.59  --   --  0.60 0.63 0.66  --   --   --  0.68  CALCIUM 8.1*   < > 8.0*  --  8.0*  --   --  8.3* 8.4* 8.2*  --   --   --  8.5*  MG 1.6*  --  2.1  --   --  2.0  --   --   --  1.6*  --   --   --  2.0   < > = values in this interval not displayed.   Liver Function Tests: Recent Labs  Lab 06/27/23 2232 06/29/23 0601 06/30/23 0424 07/01/23 0507 07/02/23 0434  AST 131* 97* 54* 33 23  ALT 124* 109* 85* 62*  45*  ALKPHOS 57 47 52 54 55  BILITOT 0.8 1.5* 1.2 1.2 1.2  PROT 5.3* 5.2* 4.9* 5.1* 5.1*  ALBUMIN 3.0* 2.7* 2.4* 2.5* 2.4*   CBC: Recent Labs  Lab 06/27/23 1020 06/27/23 1034 06/27/23 1629 06/27/23 1700 06/30/23 0424 06/30/23 0504 06/30/23 1707 07/01/23 0507 07/02/23 0434 07/02/23 1144 07/02/23 1201 07/02/23 1226 07/03/23 0413  WBC 15.6*  --  21.0*   < > 8.5  --  8.4 7.5 8.7  --   --   --  8.8  NEUTROABS 11.7*  --  16.6*  --   --   --   --   --   --   --   --   --   --   HGB 13.1   < > 13.3   < > 9.3*   < > 9.2* 9.3* 9.1* 9.2* 8.8* 9.2* 9.4*  HCT 39.8   < > 38.4   < > 27.6*   < > 26.8* 27.3* 27.0* 27.0* 26.0* 27.0* 27.4*  MCV 97.3  --  94.6   < > 96.2  --  94.7 94.8 94.4  --   --   --  94.8  PLT 153  --  168   < > 56*  --  65* 64* 76*  --   --   --  91*   < > = values in this interval not displayed.   BNP (last 3 results) Recent Labs    06/27/23 1020  BNP 543.0*   Medications:    Scheduled Medications:  aspirin  EC  81 mg Oral Daily   Chlorhexidine  Gluconate Cloth  6 each Topical Daily   feeding supplement  237 mL Oral TID BM   levothyroxine   50 mcg Oral Daily   lidocaine   1 patch Transdermal Q24H   multivitamin with minerals  1 tablet Oral Daily   polyethylene glycol  17 g Oral BID   senna  1 tablet Oral QHS   sertraline   100 mg Oral Daily   sodium chloride  flush  3 mL Intravenous Q12H    Infusions:  sodium chloride  Stopped (07/02/23 2328)    DOBUTamine  7.5 mcg/kg/min (07/03/23 0642)   milrinone 0.125 mcg/kg/min (07/03/23 0600)   norepinephrine  (LEVOPHED ) Adult infusion 1 mcg/min (07/03/23 0600)   promethazine  (PHENERGAN ) injection (IM or IVPB) Stopped (07/01/23 1026)    PRN Medications: sodium chloride , sodium chloride , acetaminophen , HYDROmorphone  (DILAUDID ) injection, hydrOXYzine , lip balm, ondansetron  (ZOFRAN ) IV, mouth rinse, oxyCODONE , promethazine  (PHENERGAN ) injection (IM or IVPB), sodium chloride  flush, zolpidem   Assessment/Plan:   1. Cardiogenic Shock, 2/2 Profound RV Failure   - cath 6/10 normal left-dominant system with non-visualized tiny RCA - Echo LVEF 60-65% RV moderate HK with McConnell's sign - RHC 6/10 (on NE 15): RA 18, PA 35/21 (26), PCWP 19, CO/CI (TD) 2.9/1.6, PVR 0.8, PAPi 0.78 - CT w/o PE - s/p Impella RP placement. Removed 6/15. - POCUS ECHO 6/12 w/ improvement LVEF 60% RV mild HK  - Decrease DBA to 5. Continue Milrinone 0.125.  - CVP 8. Volume status good. Hold diuresis today. Will continue to watch with AF.   2. Acute diastolic HF - plan as above  3. NSTEMI with RV infarct - plan as above - continue ASA/statin - No b-blocker with shock  4. Junctional rhythm with pauses - continue to monitor; currently in AF  5. PAF/AFL - intermittent - Start amio bolus + gtt  - Resume Bival. SRA pending. Plt 90K   6. 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 Shock Liver - in setting of RV failure  - resolved   8. AKI  - Initial SCr 1.4 due to ATN/shock - resolved   9. Hyponatremia - due to HF. Na 127>128>126 - restrict FW  10. 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 PLTs stabilized/improved. 90K this morning - resume bival  Length of Stay: 6  CRITICAL CARE Performed by: [place] Camden Mazzaferro  Total critical care  time: 12 minutes  -Critical care time was exclusive of separately billable procedures and treating other patients. -Critical care was necessary to treat or prevent imminent or life-threatening deterioration. -Critical care was time spent personally by me on the following activities: development of treatment plan with patient and/or surrogate as well as nursing, discussions with consultants, evaluation of patient's response to treatment, examination of patient, obtaining history from patient or surrogate, ordering and performing treatments and interventions, ordering and review of laboratory studies, ordering and review of radiographic studies, pulse oximetry and re-evaluation of patient's condition.  [place] Brandey Vandalen, NP 07/03/2023, 7:30 AM  Advanced Heart Failure Team Pager [phone] (M-F; 7a - 5p)

## 2023-07-03 NOTE — Evaluation (Signed)
 Physical Therapy Evaluation Patient Details Name: Vicki [name] MRN: [id number] DOB: 1951-01-05 Today's Date: 07/03/2023  History of Present Illness  73 y/o female presenting to the ED 06/27/23 w/ 3 day h/o severe chest pain. In cardiogenic shock due to RV failure, hypotensive, bradycardia. R and L heart cath; Chest CT ulcerated plaque in descending aorta; Impella 6/10-6/15; PMH-chronic back pain, MI, HLD, HTN, migraines, TIA, CVAx3, osteopenia, PTSD,  Clinical Impression   Pt admitted secondary to problem above with deficits below. PTA patient lives with husband in multi-level home (can live on main level) with ?3 steps to enter with rail. She was independent with all mobility. Pt currently requires min assist of 2 (due to multiple lines) with HR up to 160s with activity. Anticipate patient will benefit from PT to address problems listed below. Will continue to follow acutely to maximize functional mobility, independence, and safety.  Anticipate may need HHPT on discharge. Will continue to update as appropriate.          If plan is discharge home, recommend the following: A little help with walking and/or transfers;Assistance with cooking/housework;Direct supervision/assist for financial management;Direct supervision/assist for medications management;Help with stairs or ramp for entrance;Supervision due to cognitive status   Can travel by private vehicle        Equipment Recommendations None recommended by PT  Recommendations for Other Services       Functional Status Assessment Patient has had a recent decline in their functional status and demonstrates the ability to make significant improvements in function in a reasonable and predictable amount of time.     Precautions / Restrictions Precautions Precautions: Fall Recall of Precautions/Restrictions: Impaired      Mobility  Bed Mobility Overal bed mobility: Needs Assistance Bed Mobility: Supine to Sit     Supine to sit:  Contact guard, +2 for safety/equipment, HOB elevated     General bed mobility comments: pt with multiple lines including Swan    Transfers Overall transfer level: Needs assistance Equipment used: 2 person hand held assist Transfers: Sit to/from Stand, Bed to chair/wheelchair/BSC Sit to Stand: Min assist, +2 physical assistance, +2 safety/equipment   Step pivot transfers: Min assist, +2 physical assistance, +2 safety/equipment       General transfer comment: limited to OOB to chair due to [place]; pt stood well and took several steps around to chair    Ambulation/Gait               General Gait Details: unable due to [employer]    Modified Rankin (Stroke Patients Only)       Balance Overall balance assessment: Needs assistance Sitting-balance support: No upper extremity supported, Feet unsupported Sitting balance-Leahy Scale: Fair     Standing balance support: Bilateral upper extremity supported, During functional activity Standing balance-Leahy Scale: Poor                               Pertinent Vitals/Pain Pain Assessment Pain Assessment: No/denies pain    Home Living Family/patient expects to be discharged to:: Private residence Living Arrangements: Spouse/significant other Available Help at Discharge: Family Type of Home: House Home Access: Stairs to enter Entrance Stairs-Rails: Right Entrance Stairs-Number of Steps: 3 (per previous medical record)   Home Layout: Two level;Able to live on main level with bedroom/bathroom Home Equipment: Cane - single  point Additional Comments: Information from previous medical record as pt confused (reporting 17 steps to enter home)    Prior Function Prior Level of Function : Independent/Modified Independent             Mobility Comments: no device ADLs Comments: gets down into tub; orders groceries online     Extremity/Trunk Assessment    Upper Extremity Assessment Upper Extremity Assessment: Defer to OT evaluation    Lower Extremity Assessment Lower Extremity Assessment: Overall WFL for tasks assessed    Cervical / Trunk Assessment Cervical / Trunk Assessment: Normal  Communication   Communication Communication: No apparent difficulties    Cognition Arousal: Alert Behavior During Therapy: WFL for tasks assessed/performed   PT - Cognitive impairments: No family/caregiver present to determine baseline, Orientation, Memory, Safety/Judgement   Orientation impairments: Situation (time NT)                   PT - Cognition Comments: states she came to hospital due to a fall and broke her hip and her back; multiple confused statements Following commands: Impaired Following commands impaired: Follows one step commands with increased time     Cueing Cueing Techniques: Verbal cues     General Comments      Exercises     Assessment/Plan    PT Assessment Patient needs continued PT services  PT Problem List Decreased activity tolerance;Decreased balance;Decreased mobility;Decreased knowledge of use of DME;Cardiopulmonary status limiting activity       PT Treatment Interventions DME instruction;Gait training;Stair training;Functional mobility training;Therapeutic activities;Therapeutic exercise;Balance training;Cognitive remediation;Patient/family education    PT Goals (Current goals can be found in the Care Plan section)  Acute Rehab PT Goals Patient Stated Goal: none stated PT Goal Formulation: With patient Time For Goal Achievement: 07/17/23 Potential to Achieve Goals: Good    Frequency Min 2X/week     Co-evaluation PT/OT/SLP Co-Evaluation/Treatment: Yes Reason for Co-Treatment: Complexity of the patient's impairments (multi-system involvement);For patient/therapist safety;To address functional/ADL transfers PT goals addressed during session: Mobility/safety with mobility;Balance          AM-PAC PT 6 Clicks Mobility  Outcome Measure Help needed turning from your back to your side while in a flat bed without using bedrails?: A Little Help needed moving from lying on your back to sitting on the side of a flat bed without using bedrails?: A Little Help needed moving to and from a bed to a chair (including a wheelchair)?: Total Help needed standing up from a chair using your arms (e.g., wheelchair or bedside chair)?: Total Help needed to walk in hospital room?: Total Help needed climbing 3-5 steps with a railing? : Total 6 Click Score: 10    End of Session Equipment Utilized During Treatment: Oxygen Activity Tolerance: Patient tolerated treatment well Patient left: in chair;with call bell/phone within reach;with nursing/sitter in room Nurse Communication: Mobility status PT Visit Diagnosis: Difficulty in walking, not elsewhere classified (R26.2)    Time: 5409-8119 PT Time Calculation (min) (ACUTE ONLY): 24 min   Charges:   PT Evaluation $PT Eval Low Complexity: 1 Low   PT General Charges $$ ACUTE PT VISIT: 1 Visit          Gayle Kava, PT Acute Rehabilitation Services  Office [phone]   [place] Leep 07/03/2023, 9:48 AM

## 2023-07-03 NOTE — Plan of Care (Signed)
  Problem: Education: Goal: Knowledge of General Education information will improve Description: Including pain rating scale, medication(s)/side effects and non-pharmacologic comfort measures Outcome: Progressing   Problem: Health Behavior/Discharge Planning: Goal: Ability to manage health-related needs will improve Outcome: Progressing   Problem: Clinical Measurements: Goal: Ability to maintain clinical measurements within normal limits will improve Outcome: Progressing Goal: Will remain free from infection Outcome: Progressing Goal: Diagnostic test results will improve Outcome: Progressing Goal: Respiratory complications will improve Outcome: Progressing Goal: Cardiovascular complication will be avoided Outcome: Progressing   Problem: Activity: Goal: Risk for activity intolerance will decrease Outcome: Progressing   Problem: Nutrition: Goal: Adequate nutrition will be maintained Outcome: Progressing   Problem: Coping: Goal: Level of anxiety will decrease Outcome: Progressing   Problem: Elimination: Goal: Will not experience complications related to bowel motility Outcome: Progressing Goal: Will not experience complications related to urinary retention Outcome: Progressing   Problem: Pain Managment: Goal: General experience of comfort will improve and/or be controlled Outcome: Progressing   Problem: Safety: Goal: Ability to remain free from injury will improve Outcome: Progressing   Problem: Skin Integrity: Goal: Risk for impaired skin integrity will decrease Outcome: Progressing   Problem: Cardiac: Goal: Ability to achieve and maintain adequate cardiopulmonary perfusion will improve Outcome: Progressing Goal: Vascular access site(s) Level 0-1 will be maintained Outcome: Progressing   Problem: Fluid Volume: Goal: Ability to achieve a balanced intake and output will improve Outcome: Progressing   Problem: Physical Regulation: Goal: Complications related  to the disease process, condition or treatment will be avoided or minimized Outcome: Progressing   Problem: Respiratory: Goal: Will regain and/or maintain adequate ventilation Outcome: Progressing

## 2023-07-03 NOTE — Progress Notes (Addendum)
 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   Hydrocodone-Acetaminophen      Other Reaction(s): migraines   Peanut (Diagnostic)     Other Reaction(s): itchy   Shellfish Allergy Other (See Comments)    Throat swelling   Statins     Patient Measurements: Height: 5' 3 (160 cm) Weight: 78.5 kg (173 lb 1 oz) IBW/kg (Calculated) : 52.4 Heparin  Dosing Weight: 68 kg   Vital Signs: Temp: 100.2 F (37.9 C) (06/16 2055) Temp Source: Core (06/16 1600) BP: 88/64 (06/16 2045) Pulse Rate: 51 (06/16 2055)  Labs: Recent Labs    07/01/23 0507 07/01/23 1249 07/02/23 0434 07/02/23 1144 07/02/23 1201 07/02/23 1226 07/03/23 0413 07/03/23 1432 07/03/23 2015  HGB 9.3*  --  9.1*   < > 8.8* 9.2* 9.4*  --   --   HCT 27.3*  --  27.0*   < > 26.0* 27.0* 27.4*  --   --   PLT 64*  --  76*  --   --   --  91*  --   --   APTT 62*  --  70*  --   --   --   --  65* 71*  CREATININE 0.60 0.63 0.66  --   --   --  0.68  --   --    < > = values in this interval not displayed.    Estimated Creatinine Clearance: 63 mL/min (by C-G formula based on SCr of 0.68 mg/dL).  Medical History: Past Medical History:  Diagnosis Date   Alcohol intoxication (HCC) 05/2014    ED note    Anxiety    Arthritis    Boils    Chronic back pain    Community acquired pneumonia 01/05/2016   Depression    Fibromyalgia    GERD (gastroesophageal reflux disease)    Heart attack (HCC)    Hepatic cyst 01/06/2016   Hyperlipidemia     Hypertension    Insomnia    Leg cramps    Migraines    with aura   Mini stroke 05/2014   with paralysis for 1 hr   Osteopenia    PONV (postoperative nausea and vomiting)    severe   Postmenopausal bleeding 12/2015   PTSD (post-traumatic stress disorder)    S/P epidural steroid injection    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  Not on anticoagulation prior to admission.  Pharmacy consulted for heparin  dosing.  Chest CT w/ Type B dissection (small ulcerated plaque/developing intramural hematoma ~8 mm x  14 mm). HIT Ab positive, SRA (remains ip). Hgb 10.0, plt trending up to 91. LDH stable at 263.   Was previously on bivalirudin at 0.07 mg/kg/hr - stopped at Impella RP removed. Now flipped in Afib so will plan to restart anticoag.   2nd update: aPTT 65 sec, 71 sec (2nd level tonight just above goal range)   Goal of Therapy:  aPTT 50-70 seconds Monitor platelets by anticoagulation protocol: Yes   Plan:  Continue bivalirudin IV at 0.07 mg/kg/hr (will use new weight for dosing weight of 78.5 kg, previously 82.2 kg) aPTT in the AM Daily CBC, aPTT, SRA Monitor for signs/symptoms of bleeding  Thank you for allowing pharmacy to participate in this patient's care,  Patience Bonito, PharmD, BCPS, BCCCP Clinical Pharmacist

## 2023-07-03 NOTE — Progress Notes (Signed)
 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   Hydrocodone-Acetaminophen      Other Reaction(s): migraines   Peanut (Diagnostic)     Other Reaction(s): itchy   Shellfish Allergy Other (See Comments)    Throat swelling   Statins     Patient Measurements: Height: 5' 3 (160 cm) Weight: 78.5 kg (173 lb 1 oz) IBW/kg (Calculated) : 52.4 Heparin  Dosing Weight: 68 kg   Vital Signs: Temp: 99.3 F (37.4 C) (06/16 1600) Temp Source: Core (06/16 0800) BP: 124/105 (06/16 1545) Pulse Rate: 125 (06/16 1545)  Labs: Recent Labs    07/01/23 0507 07/01/23 1249 07/02/23 0434 07/02/23 1144 07/02/23 1201 07/02/23 1226 07/03/23 0413 07/03/23 1432  HGB 9.3*  --  9.1*   < > 8.8* 9.2* 9.4*  --   HCT 27.3*  --  27.0*   < > 26.0* 27.0* 27.4*  --   PLT 64*  --  76*  --   --   --  91*  --   APTT 62*  --  70*  --   --   --   --  65*  CREATININE 0.60 0.63 0.66  --   --   --  0.68  --    < > = values in this interval not displayed.    Estimated Creatinine Clearance: 63 mL/min (by C-G formula based on SCr of 0.68 mg/dL).  Medical History: Past Medical History:  Diagnosis Date   Alcohol intoxication (HCC) 05/2014    ED note    Anxiety    Arthritis    Boils    Chronic back pain    Community acquired pneumonia 01/05/2016   Depression    Fibromyalgia    GERD (gastroesophageal reflux disease)    Heart attack (HCC)    Hepatic cyst 01/06/2016   Hyperlipidemia    Hypertension    Insomnia    Leg cramps     Migraines    with aura   Mini stroke 05/2014   with paralysis for 1 hr   Osteopenia    PONV (postoperative nausea and vomiting)    severe   Postmenopausal bleeding 12/2015   PTSD (post-traumatic stress disorder)    S/P epidural steroid injection    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  Not on anticoagulation prior to admission.  Pharmacy consulted for heparin  dosing.  Chest CT w/ Type B dissection (small ulcerated plaque/developing intramural hematoma ~8 mm x 14 mm). HIT Ab positive, SRA (remains ip). Hgb 10.0, plt trending up to 91.  LDH stable at 263.   Was previously on bivalirudin at 0.07 mg/kg/hr - stopped at Impella RP removed. Now flipped in Afib so will plan to restart anticoag.   Goal of Therapy:  aPTT 50-70 seconds Monitor platelets by anticoagulation protocol: Yes   Plan:  Continue bivalirudin IV at 0.07 mg/kg/hr (will use new weight for dosing weight of 78.5 kg, previously 82.2 kg) 4h confirmatory aPTT Daily CBC, aPTT, SRA Monitor for signs/symptoms of bleeding  Thank you for allowing pharmacy to participate in this patient's care,  Patience Bonito, PharmD, BCPS, BCCCP Clinical Pharmacist

## 2023-07-04 ENCOUNTER — Other Ambulatory Visit: Payer: Self-pay

## 2023-07-04 ENCOUNTER — Inpatient Hospital Stay (HOSPITAL_COMMUNITY)

## 2023-07-04 DIAGNOSIS — R570 Cardiogenic shock: Secondary | ICD-10-CM | POA: Diagnosis not present

## 2023-07-04 LAB — BASIC METABOLIC PANEL WITH GFR
Anion gap: 10 (ref 5–15)
Anion gap: 15 (ref 5–15)
BUN: 7 mg/dL — ABNORMAL LOW (ref 8–23)
BUN: 9 mg/dL (ref 8–23)
CO2: 28 mmol/L (ref 22–32)
CO2: 27 mmol/L (ref 22–32)
Calcium: 8.3 mg/dL — ABNORMAL LOW (ref 8.9–10.3)
Calcium: 8.5 mg/dL — ABNORMAL LOW (ref 8.9–10.3)
Chloride: 85 mmol/L — ABNORMAL LOW (ref 98–111)
Chloride: 81 mmol/L — ABNORMAL LOW (ref 98–111)
Creatinine, Ser: 0.61 mg/dL (ref 0.44–1.00)
Creatinine, Ser: 0.72 mg/dL (ref 0.44–1.00)
GFR, Estimated: >60 mL/min (ref >60)
GFR, Estimated: >60 mL/min (ref >60)
Glucose, Bld: 113 mg/dL — ABNORMAL HIGH (ref 70–99)
Glucose, Bld: 226 mg/dL — ABNORMAL HIGH (ref 70–99)
Potassium: 3.9 mmol/L (ref 3.5–5.1)
Potassium: 3.6 mmol/L (ref 3.5–5.1)
Sodium: 123 mmol/L — ABNORMAL LOW (ref 135–145)
Sodium: 123 mmol/L — ABNORMAL LOW (ref 135–145)

## 2023-07-04 LAB — URINALYSIS, ROUTINE W REFLEX MICROSCOPIC
Bilirubin Urine: NEGATIVE
Glucose, UA: NEGATIVE mg/dL
Ketones, ur: NEGATIVE mg/dL
Leukocytes,Ua: NEGATIVE
Nitrite: NEGATIVE
Protein, ur: NEGATIVE mg/dL
Specific Gravity, Urine: 1.005 (ref 1.005–1.030)
pH: 7 (ref 5.0–8.0)

## 2023-07-04 LAB — CBC
HCT: 27.6 % — ABNORMAL LOW (ref 36.0–46.0)
Hemoglobin: 9.5 g/dL — ABNORMAL LOW (ref 12.0–15.0)
MCH: 32.2 pg (ref 26.0–34.0)
MCHC: 34.4 g/dL (ref 30.0–36.0)
MCV: 93.6 fL (ref 80.0–100.0)
Platelets: 100 10*3/uL — ABNORMAL LOW (ref 150–400)
RBC: 2.95 MIL/uL — ABNORMAL LOW (ref 3.87–5.11)
RDW: 12.7 % (ref 11.5–15.5)
WBC: 7.6 10*3/uL (ref 4.0–10.5)
nRBC: 0 % (ref 0.0–0.2)

## 2023-07-04 LAB — SEROTONIN RELEASE ASSAY (SRA)
SRA .2 IU/mL UFH Ser-aCnc: <1 % (ref 0–20)
SRA 100IU/mL UFH Ser-aCnc: <1 % (ref 0–20)

## 2023-07-04 LAB — COOXEMETRY PANEL
Carboxyhemoglobin: 1.6 % — ABNORMAL HIGH (ref 0.5–1.5)
Carboxyhemoglobin: 0.6 % (ref 0.5–1.5)
Methemoglobin: 1 % (ref 0.0–1.5)
Methemoglobin: <0.7 % (ref 0.0–1.5)
O2 Saturation: 61.3 %
O2 Saturation: 48.5 %
Total hemoglobin: 9.9 g/dL — ABNORMAL LOW (ref 12.0–16.0)
Total hemoglobin: 10.1 g/dL — ABNORMAL LOW (ref 12.0–16.0)

## 2023-07-04 LAB — HEPATIC FUNCTION PANEL
ALT: 35 U/L (ref 0–44)
AST: 27 U/L (ref 15–41)
Albumin: 2.4 g/dL — ABNORMAL LOW (ref 3.5–5.0)
Alkaline Phosphatase: 74 U/L (ref 38–126)
Bilirubin, Direct: 0.3 mg/dL — ABNORMAL HIGH (ref 0.0–0.2)
Indirect Bilirubin: 0.5 mg/dL (ref 0.3–0.9)
Total Bilirubin: 0.8 mg/dL (ref 0.0–1.2)
Total Protein: 5.4 g/dL — ABNORMAL LOW (ref 6.5–8.1)

## 2023-07-04 LAB — APTT: aPTT: 68 s — ABNORMAL HIGH (ref 24–36)

## 2023-07-04 LAB — LACTATE DEHYDROGENASE: LDH: 248 U/L — ABNORMAL HIGH (ref 98–192)

## 2023-07-04 LAB — HEPARIN LEVEL (UNFRACTIONATED): Heparin Unfractionated: 0.11 [IU]/mL — ABNORMAL LOW (ref 0.30–0.70)

## 2023-07-04 LAB — MAGNESIUM: Magnesium: 2 mg/dL (ref 1.7–2.4)

## 2023-07-04 MED ORDER — MILRINONE LACTATE IN DEXTROSE 20-5 MG/100ML-% IV SOLN
0.2500 ug/kg/min | INTRAVENOUS | Status: DC
  Administered 2023-07-04: 0.25 ug/kg/min via INTRAVENOUS
  Filled 2023-07-04: qty 100

## 2023-07-04 MED ORDER — NOREPINEPHRINE 4 MG/250ML-% IV SOLN
0.0000 ug/min | INTRAVENOUS | Status: DC
  Administered 2023-07-05: 6 ug/min via INTRAVENOUS
  Filled 2023-07-04: qty 250

## 2023-07-04 MED ORDER — DOBUTAMINE-DEXTROSE 4-5 MG/ML-% IV SOLN
2.5000 ug/kg/min | INTRAVENOUS | Status: DC
  Administered 2023-07-04: 2.5 ug/kg/min via INTRAVENOUS
  Filled 2023-07-04: qty 250

## 2023-07-04 MED ORDER — DEXTROSE 5 % IV SOLN
300.0000 mg | Freq: Once | INTRAVENOUS | Status: AC
  Administered 2023-07-04: 300 mg via INTRAVENOUS
  Filled 2023-07-04: qty 6

## 2023-07-04 MED ORDER — POTASSIUM CHLORIDE CRYS ER 20 MEQ PO TBCR
40.0000 meq | EXTENDED_RELEASE_TABLET | Freq: Once | ORAL | Status: DC
  Filled 2023-07-04: qty 2

## 2023-07-04 MED ORDER — POTASSIUM CHLORIDE CRYS ER 20 MEQ PO TBCR
40.0000 meq | EXTENDED_RELEASE_TABLET | Freq: Once | ORAL | Status: AC
  Administered 2023-07-04: 40 meq via ORAL
  Filled 2023-07-04: qty 2

## 2023-07-04 MED ORDER — FUROSEMIDE 10 MG/ML IJ SOLN
80.0000 mg | Freq: Two times a day (BID) | INTRAMUSCULAR | Status: DC
  Administered 2023-07-04 (×2): 80 mg via INTRAVENOUS
  Filled 2023-07-04 (×2): qty 8

## 2023-07-04 MED ORDER — POTASSIUM CHLORIDE 10 MEQ/100ML IV SOLN
10.0000 meq | INTRAVENOUS | Status: AC
  Administered 2023-07-04 – 2023-07-05 (×4): 10 meq via INTRAVENOUS
  Filled 2023-07-04 (×4): qty 100

## 2023-07-04 MED ORDER — HEPARIN (PORCINE) 25000 UT/250ML-% IV SOLN
1100.0000 [IU]/h | INTRAVENOUS | Status: DC
  Administered 2023-07-04: 1000 [IU]/h via INTRAVENOUS
  Administered 2023-07-05 – 2023-07-10 (×7): 1250 [IU]/h via INTRAVENOUS
  Administered 2023-07-11: 1200 [IU]/h via INTRAVENOUS
  Administered 2023-07-12 – 2023-07-13 (×2): 1100 [IU]/h via INTRAVENOUS
  Filled 2023-07-04 (×11): qty 250

## 2023-07-04 MED ORDER — AMIODARONE LOAD VIA INFUSION
150.0000 mg | Freq: Once | INTRAVENOUS | Status: AC
  Administered 2023-07-04: 150 mg via INTRAVENOUS
  Filled 2023-07-04: qty 83.34

## 2023-07-04 MED ORDER — NOREPINEPHRINE 4 MG/250ML-% IV SOLN
INTRAVENOUS | Status: AC
  Administered 2023-07-04: 10 ug/min via INTRAVENOUS
  Filled 2023-07-04: qty 250

## 2023-07-04 MED ORDER — SODIUM CHLORIDE 0.9% FLUSH
10.0000 mL | Freq: Two times a day (BID) | INTRAVENOUS | Status: DC
  Administered 2023-07-04: 20 mL
  Administered 2023-07-05 – 2023-07-07 (×5): 10 mL
  Administered 2023-07-07: 40 mL
  Administered 2023-07-08 – 2023-07-14 (×12): 10 mL

## 2023-07-04 MED ORDER — MAGNESIUM SULFATE 2 GM/50ML IV SOLN
2.0000 g | Freq: Once | INTRAVENOUS | Status: AC
  Administered 2023-07-04: 2 g via INTRAVENOUS
  Filled 2023-07-04: qty 50

## 2023-07-04 MED ORDER — SODIUM CHLORIDE 0.9% FLUSH: 10.0000 mL | INTRAVENOUS | Status: DC | PRN

## 2023-07-04 MED ORDER — DOBUTAMINE-DEXTROSE 4-5 MG/ML-% IV SOLN: 5.0000 ug/kg/min | INTRAVENOUS | Status: DC

## 2023-07-04 NOTE — Progress Notes (Addendum)
 Peripherally Inserted Central Catheter Placement  The IV Nurse has discussed with the patient and/or persons authorized to consent for the patient, the purpose of this procedure and the potential benefits and risks involved with this procedure.  The benefits include less needle sticks, lab draws from the catheter, and the patient may be discharged home with the catheter. Risks include, but not limited to, infection, bleeding, blood clot (thrombus formation), and puncture of an artery; nerve damage and irregular heartbeat and possibility to perform a PICC exchange if needed/ordered by physician.  Alternatives to this procedure were also discussed.  Bard Power PICC patient education guide, fact sheet on infection prevention and patient information card has been provided to patient /or left at bedside.    PICC Placement Documentation  PICC Double Lumen 07/04/23 Right Basilic 39 cm 0 cm (Active)  Indication for Insertion or Continuance of Line Vasoactive infusions 07/04/23 1120  Exposed Catheter (cm) 0 cm 07/04/23 1120  Site Assessment Clean, Dry, Intact 07/04/23 1120  Lumen #1 Status Flushed;Saline locked;Blood return noted 07/04/23 1120  Lumen #2 Status Flushed;Saline locked;Blood return noted 07/04/23 1120  Dressing Type Transparent;Securing device 07/04/23 1120  Dressing Status Antimicrobial disc/dressing in place;Clean, Dry, Intact 07/04/23 1120  Line Care Connections checked and tightened 07/04/23 1120  Dressing Intervention New dressing;Adhesive placed at insertion site (IV team only) 07/04/23 1120  Dressing Change Due 07/11/23 07/04/23 1120    Prior to insertion of PICC, upper right arm already noted to have diffuse bruising around previous A.Line access site.     Cherise Cornelia 07/04/2023, 11:23 AM

## 2023-07-04 NOTE — Plan of Care (Signed)
  Problem: Clinical Measurements: Goal: Ability to maintain clinical measurements within normal limits will improve Outcome: Progressing Goal: Will remain free from infection Outcome: Progressing Goal: Diagnostic test results will improve Outcome: Progressing Goal: Respiratory complications will improve Outcome: Progressing Goal: Cardiovascular complication will be avoided Outcome: Progressing   Problem: Activity: Goal: Risk for activity intolerance will decrease Outcome: Progressing   Problem: Coping: Goal: Level of anxiety will decrease Outcome: Progressing   Problem: Elimination: Goal: Will not experience complications related to bowel motility Outcome: Progressing Goal: Will not experience complications related to urinary retention Outcome: Progressing   Problem: Pain Managment: Goal: General experience of comfort will improve and/or be controlled Outcome: Progressing   Problem: Safety: Goal: Ability to remain free from injury will improve Outcome: Progressing   Problem: Skin Integrity: Goal: Risk for impaired skin integrity will decrease Outcome: Progressing   Problem: Cardiac: Goal: Ability to achieve and maintain adequate cardiopulmonary perfusion will improve Outcome: Progressing Goal: Vascular access site(s) Level 0-1 will be maintained Outcome: Progressing   Problem: Fluid Volume: Goal: Ability to achieve a balanced intake and output will improve Outcome: Progressing   Problem: Physical Regulation: Goal: Complications related to the disease process, condition or treatment will be avoided or minimized Outcome: Progressing   Problem: Respiratory: Goal: Will regain and/or maintain adequate ventilation Outcome: Progressing

## 2023-07-04 NOTE — TOC Progression Note (Signed)
 Transition of Care [facility]) - Progression Note    Patient Details  Name: Vicki [name] MRN: [id number] Date of Birth: Jun 30, 1950  [facility]) CM/SW Contact  Benjiman Bras, RN Phone Number: [phone] 07/04/2023, 9:19 AM  Clinical Narrative:    TOC CM reviewed chart for medically readiness. Pt remains on Amiodarone and Dobutamine .  Will need PT/OT evaluation and recommendations. Pt was independent pta and may need rehab or HH at dc.  Will continue to follow for dc needs closer to medical readiness.    Expected Discharge Plan: Home w Home Health Services Barriers to Discharge: Continued Medical Work up  Expected Discharge Plan and Services   Discharge Planning Services: CM Consult   Living arrangements for the past 2 months: Single Family Home                                       Social Determinants of Health (SDOH) Interventions SDOH Screenings   Food Insecurity: No Food Insecurity (06/28/2023)  Housing: Low Risk  (06/28/2023)  Transportation Needs: No Transportation Needs (06/28/2023)  Utilities: Not At Risk (06/28/2023)  Social Connections: Socially Integrated (06/28/2023)  Tobacco Use: Medium Risk (06/27/2023)    Readmission Risk Interventions     No data to display

## 2023-07-04 NOTE — H&P (View-Only) (Signed)
 Advanced Heart Failure Progress Update  Notified by RN, patient in polymorphic VT on tele with nausea and diaphoresis.   - Amio bolus given, rate increased to 60/hr - Stop DBA - Give 2 mg IV Mg - PICC line in place. Pull Swan. Check co-ox/ CVP monitoring from PICC line. - Send BMET  Vicki Tamaira Ciriello, NP 07/04/23, 3:30 PM  Advanced Heart Failure Team Pager [phone] (M-F; 7a - 5p). For after hours, see Amion for HF Team on-call MD

## 2023-07-04 NOTE — Progress Notes (Signed)
 1220: RUE PICC tip appears curved on CXR. Pt positioned sitting forward, line flushed. Instructed her to remain in this position for 40 min. Repeat CXR ordered. 1540: PICC tip in good position. RN made aware.

## 2023-07-04 NOTE — Progress Notes (Signed)
 Advanced Heart Failure Progress Update  Notified by RN, patient in polymorphic VT on tele with nausea and diaphoresis.   - Amio bolus given, rate increased to 60/hr - Stop DBA - Give 2 mg IV Mg - PICC line in place. Pull Swan. Check co-ox/ CVP monitoring from PICC line. - Send BMET  Vicki Tamaira Ciriello, NP 07/04/23, 3:30 PM  Advanced Heart Failure Team Pager [phone] (M-F; 7a - 5p). For after hours, see Amion for HF Team on-call MD

## 2023-07-04 NOTE — Progress Notes (Signed)
 Advanced Heart Failure Rounding Note  HF Cardiologist: Carollynn Cirri, MD Chief Complaint: RV Failure Subjective:    6/15: RP Impella removed.  Coox 61%. CO/CI (fick) 4.5/2.4. On DBA 5. Off Milrinone. Off NE.  In/out AF 120s and accelerated junctional in 40-50s. On Amio 30/hr.  Net - 770cc with Lasix 40 x1. Remains 12 lbs above admission weight.  Intermittent low-grade fevers Tmax 100.6  Sitting up in the chair. Feeling okay this morning. No SOB.   Objective:    Vital Signs:   Temp:  [98.8 F (37.1 C)-101.1 F (38.4 C)] 100.2 F (37.9 C) (06/17 0700) Pulse Rate:  [44-130] 47 (06/17 0700) Resp:  [11-28] 15 (06/17 0700) BP: (74-159)/(30-116) 93/63 (06/17 0700) SpO2:  [78 %-100 %] 91 % (06/17 0700) Weight:  [80.6 kg] 80.6 kg (06/17 0452) Last BM Date :  (PTA)  Weight change: Filed Weights   07/02/23 0500 07/03/23 0500 07/04/23 0452  Weight: 78.2 kg 78.5 kg 80.6 kg   Intake/Output:  Intake/Output Summary (Last 24 hours) at 07/04/2023 0726 Last data filed at 07/04/2023 0700 Gross per 24 hour  Intake 975.57 ml  Output 1750 ml  Net -774.43 ml    PHYSICAL EXAM: General: Weak appearing. No distress on [facility] Cardiac: S1 and S2 present. No murmurs or rub. Extremities: Warm and dry.  Trace BLE edema.  Neuro: Alert and oriented x3. Affect pleasant. Moves all extremities w weakness Lines/Devices:  LIJ introducer + PAC  Telemetry: Predominantly accelerated junctional in 40-50s with intermittent AF in 120s (personally reviewed)  Labs: Basic Metabolic Panel: Recent Labs  Lab 06/29/23 0601 06/29/23 0609 06/30/23 0429 06/30/23 0504 07/01/23 0507 07/01/23 1249 07/02/23 0434 07/02/23 1144 07/02/23 1201 07/02/23 1226 07/03/23 0413 07/04/23 0354  NA 129*   < >  --    < > 127* 129* 128* 126* 126* 125* 126* 123*  K 3.8   < >  --    < > 4.2 3.8 3.6 4.6 4.4 4.5 3.9 3.9  CL 97*   < >  --   --  95* 95* 91*  --   --   --  87* 85*  CO2 23   < >  --   --  23 25 28   --   --   --   29 28  GLUCOSE 149*   < >  --   --  110* 112* 109*  --   --   --  109* 113*  BUN 14   < >  --   --  9 7* 5*  --   --   --  7* 7*  CREATININE 0.77   < >  --   --  0.60 0.63 0.66  --   --   --  0.68 0.61  CALCIUM 8.0*   < >  --   --  8.3* 8.4* 8.2*  --   --   --  8.5* 8.3*  MG 2.1  --  2.0  --   --   --  1.6*  --   --   --  2.0 2.0   < > = values in this interval not displayed.   Liver Function Tests: Recent Labs  Lab 06/27/23 2232 06/29/23 0601 06/30/23 0424 07/01/23 0507 07/02/23 0434  AST 131* 97* 54* 33 23  ALT 124* 109* 85* 62* 45*  ALKPHOS 57 47 52 54 55  BILITOT 0.8 1.5* 1.2 1.2 1.2  PROT 5.3* 5.2* 4.9* 5.1* 5.1*  ALBUMIN 3.0*  2.7* 2.4* 2.5* 2.4*   CBC: Recent Labs  Lab 06/27/23 1020 06/27/23 1034 06/27/23 1629 06/27/23 1700 06/30/23 1707 07/01/23 0507 07/02/23 0434 07/02/23 1144 07/02/23 1201 07/02/23 1226 07/03/23 0413 07/04/23 0354  WBC 15.6*  --  21.0*   < > 8.4 7.5 8.7  --   --   --  8.8 7.6  NEUTROABS 11.7*  --  16.6*  --   --   --   --   --   --   --   --   --   HGB 13.1   < > 13.3   < > 9.2* 9.3* 9.1* 9.2* 8.8* 9.2* 9.4* 9.5*  HCT 39.8   < > 38.4   < > 26.8* 27.3* 27.0* 27.0* 26.0* 27.0* 27.4* 27.6*  MCV 97.3  --  94.6   < > 94.7 94.8 94.4  --   --   --  94.8 93.6  PLT 153  --  168   < > 65* 64* 76*  --   --   --  91* 100*   < > = values in this interval not displayed.   BNP (last 3 results) Recent Labs    06/27/23 1020  BNP 543.0*   Medications:    Scheduled Medications:  aspirin  EC  81 mg Oral Daily   Chlorhexidine  Gluconate Cloth  6 each Topical Daily   feeding supplement  237 mL Oral TID BM   levothyroxine   50 mcg Oral Daily   lidocaine   1 patch Transdermal Q24H   multivitamin with minerals  1 tablet Oral Daily   polyethylene glycol  17 g Oral BID   senna  1 tablet Oral QHS   sertraline   100 mg Oral Daily   sodium chloride  flush  3 mL Intravenous Q12H   Infusions:  amiodarone 30 mg/hr (07/04/23 0700)   bivalirudin (ANGIOMAX) 250 mg  in sodium chloride  0.9 % 500 mL (0.5 mg/mL) infusion 0.07 mg/kg/hr (07/04/23 0700)   DOBUTamine  5 mcg/kg/min (07/04/23 0700)   norepinephrine  (LEVOPHED ) Adult infusion Stopped (07/04/23 0450)   promethazine  (PHENERGAN ) injection (IM or IVPB) Stopped (07/01/23 1026)   PRN Medications: sodium chloride , acetaminophen , HYDROmorphone  (DILAUDID ) injection, hydrOXYzine , lip balm, ondansetron  (ZOFRAN ) IV, mouth rinse, oxyCODONE , promethazine  (PHENERGAN ) injection (IM or IVPB), sodium chloride  flush, zolpidem   Assessment/Plan:   1. Cardiogenic Shock, 2/2 Profound RV Failure   - cath 6/10 normal left-dominant system with non-visualized tiny RCA - Echo LVEF 60-65% RV moderate HK with McConnell's sign - RHC 6/10 (on NE 15): RA 18, PA 35/21 (26), PCWP 19, CO/CI (TD) 2.9/1.6, PVR 0.8, PAPi 0.78 - CT w/o PE - s/p Impella RP placement. Removed 6/15. - POCUS ECHO 6/12 w/ improvement LVEF 60% RV mild HK  - Coox 61%. Continue DBA to 5. Off Milrinone - Volume appears up. Lasix 80 mg bid.   2. Acute diastolic HF - plan as above  3. NSTEMI with RV infarct - plan as above - continue ASA/statin - no b-blocker with shock  4. Junctional rhythm with pauses - continue to monitor; currently in brady with intermittent AF in 120s  5. PAF/AFL - intermittent - continue amio 30/hr - continue bival SRA pending. Plt 100K   6. 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 Shock Liver - in setting of RV failure  -  resolved   8. AKI  - Initial SCr 1.4 due to ATN/shock - resolved   9. Hyponatremia - due to HF. Na 123 today - restrict FW  10. 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 PLTs stabilized/improved. 100K this morning - continue bival  11. Low grade fevers - Tmax 101.0 - obtain UA w reflex, BCXs - place PICC line and  pull PAC and CVL  Length of Stay: 7  CRITICAL CARE Performed by: [place] Satori Krabill  Total critical care time: 13 minutes  -Critical care time was exclusive of separately billable procedures and treating other patients. -Critical care was necessary to treat or prevent imminent or life-threatening deterioration. -Critical care was time spent personally by me on the following activities: development of treatment plan with patient and/or surrogate as well as nursing, discussions with consultants, evaluation of patient's response to treatment, examination of patient, obtaining history from patient or surrogate, ordering and performing treatments and interventions, ordering and review of laboratory studies, ordering and review of radiographic studies, pulse oximetry and re-evaluation of patient's condition.  [place] Aubreyanna Dorrough, NP 07/04/2023, 7:26 AM  Advanced Heart Failure Team Pager [phone] (M-F; 7a - 5p)

## 2023-07-04 NOTE — Progress Notes (Signed)
 ANTICOAGULATION CONSULT NOTE  Pharmacy Consult for heparin >> bivalirudin >>heparin  Indication: Impella RP >Afib  Allergies  Allergen Reactions   Codeine Other (See Comments)    headache   Crestor [Rosuvastatin] Other (See Comments)    Muscle aches   Other Nausea And Vomiting    Anesthesia-severe vomiting   Simvastatin Other (See Comments)    Muscle aches   Egg White (Egg Protein)     Other Reaction(s): Throat closes   Ezetimibe      Other Reaction(s): crippling muscle aches   Hydrocodone-Acetaminophen      Other Reaction(s): migraines   Peanut (Diagnostic)     Other Reaction(s): itchy   Shellfish Allergy Other (See Comments)    Throat swelling   Statins     Patient Measurements: Height: 5' 3 (160 cm) Weight: 80.6 kg (177 lb 11.1 oz) IBW/kg (Calculated) : 52.4 Heparin  Dosing Weight: 68 kg   Vital Signs: Temp: 97.7 F (36.5 C) (06/17 2021) Temp Source: Oral (06/17 2021) BP: 123/61 (06/17 2116) Pulse Rate: 151 (06/17 2116)  Labs: Recent Labs    07/02/23 0434 07/02/23 1144 07/02/23 1226 07/03/23 0413 07/03/23 1432 07/03/23 2015 07/04/23 0354 07/04/23 1402 07/04/23 2040  HGB 9.1*   < > 9.2* 9.4*  --   --  9.5*  --   --   HCT 27.0*   < > 27.0* 27.4*  --   --  27.6*  --   --   PLT 76*  --   --  91*  --   --  100*  --   --   APTT 70*  --   --   --  65* 71* 68*  --   --   HEPARINUNFRC  --   --   --   --   --   --   --   --  0.11*  CREATININE 0.66  --   --  0.68  --   --  0.61 0.72  --    < > = values in this interval not displayed.    Estimated Creatinine Clearance: 63.9 mL/min (by C-G formula based on SCr of 0.72 mg/dL).   Assessment: 73 yo female in cardiogenic shock with RV failure s/p Impella RP 6/10.  Not on anticoagulation prior to admission.  Pharmacy consulted for heparin  dosing.  Chest CT w/ Type B dissection (small ulcerated plaque/developing intramural hematoma ~8 mm x 14 mm).   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  Discussed with team since HIT  negative will change to heparin  infusion 6/17.   Heparin  level subtherapeutic (0.11) on infusion at 1000 units/hr. No issues with line or bleeding reported per RN.  Goal of Therapy:  Heparin  level 0.3-0.7 units/ml Monitor platelets by anticoagulation protocol: Yes   Plan:  Increase heparin  infusion to 1250 units/hr Will f/u 8 hr heparin  level  Enrigue Harvard, PharmD, BCPS Please see amion for complete clinical pharmacist phone list 07/04/2023 9:38 PM

## 2023-07-04 NOTE — Progress Notes (Signed)
 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   Hydrocodone-Acetaminophen      Other Reaction(s): migraines   Peanut (Diagnostic)     Other Reaction(s): itchy   Shellfish Allergy Other (See Comments)    Throat swelling   Statins     Patient Measurements: Height: 5' 3 (160 cm) Weight: 80.6 kg (177 lb 11.1 oz) IBW/kg (Calculated) : 52.4 Heparin  Dosing Weight: 68 kg   Vital Signs: Temp: 100.2 F (37.9 C) (06/17 0700) Temp Source: Core (06/16 2000) BP: 93/63 (06/17 0700) Pulse Rate: 47 (06/17 0700)  Labs: Recent Labs    07/02/23 0434 07/02/23 1144 07/02/23 1226 07/03/23 0413 07/03/23 1432 07/03/23 2015 07/04/23 0354  HGB 9.1*   < > 9.2* 9.4*  --   --  9.5*  HCT 27.0*   < > 27.0* 27.4*  --   --  27.6*  PLT 76*  --   --  91*  --   --  100*  APTT 70*  --   --   --  65* 71* 68*  CREATININE 0.66  --   --  0.68  --   --  0.61   < > = values in this interval not displayed.    Estimated Creatinine Clearance: 63.9 mL/min (by C-G formula based on SCr of 0.61 mg/dL).  Medical History: Past Medical History:  Diagnosis Date   Alcohol intoxication (HCC) 05/2014    ED note    Anxiety    Arthritis    Boils    Chronic back pain    Community acquired pneumonia 01/05/2016   Depression    Fibromyalgia    GERD (gastroesophageal reflux disease)    Heart attack (HCC)    Hepatic cyst 01/06/2016   Hyperlipidemia    Hypertension    Insomnia    Leg cramps    Migraines    with aura   Mini stroke  05/2014   with paralysis for 1 hr   Osteopenia    PONV (postoperative nausea and vomiting)    severe   Postmenopausal bleeding 12/2015   PTSD (post-traumatic stress disorder)    S/P epidural steroid injection    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  Not on anticoagulation prior to admission.  Pharmacy consulted for heparin  dosing.  Chest CT w/ Type B dissection (small ulcerated plaque/developing intramural hematoma ~8 mm x 14 mm).   HIT Ab positive, SRA (remains ip). Hgb 9.5, plt trending up to 100. LDH stable at 248. aPTT is therapeutic at 68, on bival@0 .07  mg/kg/hr. No s/sx of bleeding or infusion issues.    Goal of Therapy:  aPTT 50-70 seconds Monitor platelets by anticoagulation protocol: Yes   Plan:  Continue bivalirudin IV at 0.07 mg/kg/hr (dosing weight of 78.5 kg) Daily CBC, aPTT, SRA Monitor for signs/symptoms of bleeding  ADDENDUM  SRA came back negative - discussed with team since HIT negative will change to heparin  infusion. Will start heparin  infusion at 1000 units/hr and get level in 6 hours. Monitor s/sx of bleeding and daily HL.   Thank you for allowing pharmacy to participate in this patient's care,  Nieves Bars, PharmD, BCCCP Clinical Pharmacist  Phone: [phone] 07/04/2023 7:44 AM  Please check AMION for all [facility] Pharmacy phone numbers After 10:00 PM, call Main Pharmacy [phone]

## 2023-07-05 ENCOUNTER — Encounter (HOSPITAL_COMMUNITY)
Admission: EM | Disposition: A | Discharge status: Home Health | Payer: Self-pay | Source: Home / Self Care | Attending: Internal Medicine

## 2023-07-05 ENCOUNTER — Other Ambulatory Visit (HOSPITAL_COMMUNITY)

## 2023-07-05 DIAGNOSIS — R570 Cardiogenic shock: Secondary | ICD-10-CM | POA: Diagnosis not present

## 2023-07-05 HISTORY — PX: TEMPORARY PACEMAKER: CATH118268

## 2023-07-05 LAB — CBC
HCT: 30.1 % — ABNORMAL LOW (ref 36.0–46.0)
Hemoglobin: 10.3 g/dL — ABNORMAL LOW (ref 12.0–15.0)
MCH: 31.8 pg (ref 26.0–34.0)
MCHC: 34.2 g/dL (ref 30.0–36.0)
MCV: 92.9 fL (ref 80.0–100.0)
Platelets: 142 10*3/uL — ABNORMAL LOW (ref 150–400)
RBC: 3.24 MIL/uL — ABNORMAL LOW (ref 3.87–5.11)
RDW: 12.8 % (ref 11.5–15.5)
WBC: 15.4 10*3/uL — ABNORMAL HIGH (ref 4.0–10.5)
nRBC: 0 % (ref 0.0–0.2)

## 2023-07-05 LAB — BLOOD CULTURE ID PANEL (REFLEXED) - BCID2

## 2023-07-05 LAB — HEPARIN LEVEL (UNFRACTIONATED)
Heparin Unfractionated: 0.38 [IU]/mL (ref 0.30–0.70)
Heparin Unfractionated: <0.1 [IU]/mL — ABNORMAL LOW (ref 0.30–0.70)
Heparin Unfractionated: 0.3 [IU]/mL (ref 0.30–0.70)

## 2023-07-05 LAB — BASIC METABOLIC PANEL WITH GFR
Anion gap: 12 (ref 5–15)
Anion gap: 17 — ABNORMAL HIGH (ref 5–15)
Anion gap: 13 (ref 5–15)
BUN: 15 mg/dL (ref 8–23)
BUN: 19 mg/dL (ref 8–23)
BUN: 17 mg/dL (ref 8–23)
CO2: 25 mmol/L (ref 22–32)
CO2: 20 mmol/L — ABNORMAL LOW (ref 22–32)
CO2: 28 mmol/L (ref 22–32)
Calcium: 8.1 mg/dL — ABNORMAL LOW (ref 8.9–10.3)
Calcium: 14.5 mg/dL (ref 8.9–10.3)
Calcium: 8.8 mg/dL — ABNORMAL LOW (ref 8.9–10.3)
Chloride: 83 mmol/L — ABNORMAL LOW (ref 98–111)
Chloride: 84 mmol/L — ABNORMAL LOW (ref 98–111)
Chloride: 81 mmol/L — ABNORMAL LOW (ref 98–111)
Creatinine, Ser: 0.96 mg/dL (ref 0.44–1.00)
Creatinine, Ser: 0.95 mg/dL (ref 0.44–1.00)
Creatinine, Ser: 0.86 mg/dL (ref 0.44–1.00)
GFR, Estimated: >60 mL/min (ref >60)
GFR, Estimated: >60 mL/min (ref >60)
GFR, Estimated: >60 mL/min (ref >60)
Glucose, Bld: 210 mg/dL — ABNORMAL HIGH (ref 70–99)
Glucose, Bld: 124 mg/dL — ABNORMAL HIGH (ref 70–99)
Glucose, Bld: 210 mg/dL — ABNORMAL HIGH (ref 70–99)
Potassium: 6.1 mmol/L — ABNORMAL HIGH (ref 3.5–5.1)
Potassium: 5.2 mmol/L — ABNORMAL HIGH (ref 3.5–5.1)
Potassium: 3.9 mmol/L (ref 3.5–5.1)
Sodium: 120 mmol/L — ABNORMAL LOW (ref 135–145)
Sodium: 121 mmol/L — ABNORMAL LOW (ref 135–145)
Sodium: 122 mmol/L — ABNORMAL LOW (ref 135–145)

## 2023-07-05 LAB — COOXEMETRY PANEL
Carboxyhemoglobin: 0.9 % (ref 0.5–1.5)
Carboxyhemoglobin: 1.3 % (ref 0.5–1.5)
Methemoglobin: 0.9 % (ref 0.0–1.5)
Methemoglobin: <0.7 % (ref 0.0–1.5)
O2 Saturation: 47.4 %
O2 Saturation: 50.6 %
Total hemoglobin: 10.5 g/dL — ABNORMAL LOW (ref 12.0–16.0)
Total hemoglobin: 10.6 g/dL — ABNORMAL LOW (ref 12.0–16.0)

## 2023-07-05 LAB — GLUCOSE, CAPILLARY
Glucose-Capillary: 103 mg/dL — ABNORMAL HIGH (ref 70–99)
Glucose-Capillary: 116 mg/dL — ABNORMAL HIGH (ref 70–99)

## 2023-07-05 LAB — HEMOGLOBIN A1C
Hgb A1c MFr Bld: 4.9 % (ref 4.8–5.6)
Mean Plasma Glucose: 93.93 mg/dL

## 2023-07-05 LAB — MAGNESIUM: Magnesium: 2.3 mg/dL (ref 1.7–2.4)

## 2023-07-05 LAB — POTASSIUM: Potassium: 5.7 mmol/L — ABNORMAL HIGH (ref 3.5–5.1)

## 2023-07-05 SURGERY — TEMPORARY PACEMAKER
Anesthesia: LOCAL

## 2023-07-05 MED ORDER — INSULIN ASPART 100 UNIT/ML IJ SOLN: 0.0000 [IU] | Freq: Three times a day (TID) | INTRAMUSCULAR | Status: DC

## 2023-07-05 MED ORDER — CALCIUM GLUCONATE-NACL 1-0.675 GM/50ML-% IV SOLN
1.0000 g | Freq: Once | INTRAVENOUS | Status: AC
  Administered 2023-07-05: 1000 mg via INTRAVENOUS
  Filled 2023-07-05: qty 50

## 2023-07-05 MED ORDER — SODIUM CHLORIDE 0.9 % IV SOLN
INTRAVENOUS | Status: AC | PRN
  Administered 2023-07-05: 10 mL/h via INTRAVENOUS

## 2023-07-05 MED ORDER — CEFAZOLIN SODIUM-DEXTROSE 2-4 GM/100ML-% IV SOLN
2.0000 g | Freq: Three times a day (TID) | INTRAVENOUS | Status: DC
  Administered 2023-07-05 – 2023-07-14 (×29): 2 g via INTRAVENOUS
  Filled 2023-07-05 (×29): qty 100

## 2023-07-05 MED ORDER — DOBUTAMINE-DEXTROSE 4-5 MG/ML-% IV SOLN
2.5000 ug/kg/min | INTRAVENOUS | Status: DC
  Administered 2023-07-05 – 2023-07-06 (×2): 5 ug/kg/min via INTRAVENOUS
  Filled 2023-07-05 (×2): qty 250

## 2023-07-05 MED ORDER — SODIUM CHLORIDE 0.9 % IV SOLN: INTRAVENOUS | Status: DC

## 2023-07-05 MED ORDER — SODIUM ZIRCONIUM CYCLOSILICATE 5 G PO PACK
5.0000 g | PACK | Freq: Once | ORAL | Status: AC
  Administered 2023-07-05: 5 g via ORAL
  Filled 2023-07-05: qty 1

## 2023-07-05 MED ORDER — INSULIN ASPART 100 UNIT/ML IJ SOLN: 0.0000 [IU] | Freq: Every day | INTRAMUSCULAR | Status: DC

## 2023-07-05 MED ORDER — LIDOCAINE HCL (PF) 1 % IJ SOLN
INTRAMUSCULAR | Status: DC | PRN
  Administered 2023-07-05: 10 mL

## 2023-07-05 MED ORDER — FUROSEMIDE 10 MG/ML IJ SOLN
120.0000 mg | Freq: Once | INTRAVENOUS | Status: AC
  Administered 2023-07-05: 120 mg via INTRAVENOUS
  Filled 2023-07-05: qty 10

## 2023-07-05 MED ORDER — LIDOCAINE HCL (PF) 1 % IJ SOLN
INTRAMUSCULAR | Status: AC
  Filled 2023-07-05: qty 30

## 2023-07-05 SURGICAL SUPPLY — 7 items
CABLE ADAPT PACING TEMP 12FT (ADAPTER) IMPLANT
CATH S G BIP PACING (CATHETERS) IMPLANT
ELECT DEFIB PAD ADLT CADENCE (PAD) IMPLANT
MAT PREVALON FULL STRYKER (MISCELLANEOUS) IMPLANT
SHEATH PINNACLE 6F 10CM (SHEATH) IMPLANT
SHEATH PROBE COVER 6X72 (BAG) IMPLANT
SHIELD CATH-GARD CONTAMINATION (MISCELLANEOUS) IMPLANT

## 2023-07-05 NOTE — Progress Notes (Signed)
 PHARMACY - ANTICOAGULATION CONSULT NOTE  Pharmacy Consult for heparin  Indication: atrial fibrillation  Labs: Recent Labs    07/03/23 0413 07/03/23 1432 07/03/23 2015 07/04/23 0354 07/04/23 1402 07/05/23 0427 07/05/23 1200 07/05/23 1400 07/05/23 1810 07/05/23 2245  HGB 9.4*  --   --  9.5*  --  10.3*  --   --   --   --   HCT 27.4*  --   --  27.6*  --  30.1*  --   --   --   --   PLT 91*  --   --  100*  --  142*  --   --   --   --   APTT  --  65* 71* 68*  --   --   --   --   --   --   HEPARINUNFRC  --   --   --   --    < > 0.38  --  <0.10*  --  0.30  CREATININE 0.68  --   --  0.61   < > 0.96 0.95  --  0.86  --    < > = values in this interval not displayed.   Assessment/Plan:  73yo female therapeutic on heparin  after resuming. Will continue infusion at current rate of 1250 units/hr and confirm stable with am labs.  Lonnie Roberts, PharmD, BCPS 07/05/2023 11:15 PM

## 2023-07-05 NOTE — Interval H&P Note (Signed)
 History and Physical Interval Note:  07/05/2023 12:03 PM  Vicki [name]  has presented today for surgery, with the diagnosis of Junctional Rhythm.  The various methods of treatment have been discussed with the patient and family. After consideration of risks, benefits and other options for treatment, the patient has consented to  Procedure(s): TEMPORARY PACEMAKER (N/A) as a surgical intervention.  The patient's history has been reviewed, patient examined, no change in status, stable for surgery.  I have reviewed the patient's chart and labs.  Questions were answered to the patient's satisfaction.     Lauralee Poll

## 2023-07-05 NOTE — Progress Notes (Signed)
 ANTICOAGULATION CONSULT NOTE  Pharmacy Consult for heparin >> bivalirudin >>heparin  Indication: Impella RP >Afib  Allergies  Allergen Reactions   Codeine Other (See Comments)    headache   Crestor [Rosuvastatin] Other (See Comments)    Muscle aches   Other Nausea And Vomiting    Anesthesia-severe vomiting   Simvastatin Other (See Comments)    Muscle aches   Egg White (Egg Protein)     Other Reaction(s): Throat closes   Ezetimibe      Other Reaction(s): crippling muscle aches   Hydrocodone-Acetaminophen      Other Reaction(s): migraines   Peanut (Diagnostic)     Other Reaction(s): itchy   Shellfish Allergy Other (See Comments)    Throat swelling   Statins     Patient Measurements: Height: 5' 3 (160 cm) Weight: 80.5 kg (177 lb 7.5 oz) IBW/kg (Calculated) : 52.4 Heparin  Dosing Weight: 68 kg   Vital Signs: Temp: 97.8 F (36.6 C) (06/18 0340) Temp Source: Oral (06/18 0340) BP: 116/68 (06/18 0515) Pulse Rate: 30 (06/18 0430)  Labs: Recent Labs    07/03/23 0413 07/03/23 1432 07/03/23 2015 07/04/23 0354 07/04/23 1402 07/04/23 2040 07/05/23 0427  HGB 9.4*  --   --  9.5*  --   --  10.3*  HCT 27.4*  --   --  27.6*  --   --  30.1*  PLT 91*  --   --  100*  --   --  142*  APTT  --  65* 71* 68*  --   --   --   HEPARINUNFRC  --   --   --   --   --  0.11* 0.38  CREATININE 0.68  --   --  0.61 0.72  --  0.96    Estimated Creatinine Clearance: 53.2 mL/min (by C-G formula based on SCr of 0.96 mg/dL).   Assessment: 73 yo female in cardiogenic shock with RV failure s/p Impella RP 6/10.  Not on anticoagulation prior to admission.  Pharmacy consulted for heparin  dosing.  Chest CT w/ Type B dissection (small ulcerated plaque/developing intramural hematoma ~8 mm x 14 mm).   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  Discussed with team since HIT negative will change to heparin  infusion 6/17.   Heparin  level subtherapeutic (0.11) on infusion at 1000 units/hr. No issues with line or  bleeding reported per RN.  6/18 AM update:  Heparin  level therapeutic after rate increase  Goal of Therapy:  Heparin  level 0.3-0.7 units/ml Monitor platelets by anticoagulation protocol: Yes   Plan:  Cont heparin  infusion at 1250 units/hr Heparin  level in 8 hours  Silvestre Drum, PharmD, BCPS Clinical Pharmacist Phone: [phone]

## 2023-07-05 NOTE — Progress Notes (Signed)
 PT Cancellation Note  Patient Details Name: Vicki [name] MRN: [id number] DOB: 10-24-1950   Cancelled Treatment:    Reason Eval/Treat Not Completed: Medical issues which prohibited therapy  HR currently 36 with RN reporting it's been as low as 28 this morning. Will continue to follow and see as medically appropriate.    Gayle Kava, PT Acute Rehabilitation Services  Office [phone]  [place] Leep 07/05/2023, 9:41 AM

## 2023-07-05 NOTE — Progress Notes (Addendum)
 Advanced Heart Failure Rounding Note  HF Cardiologist: Carollynn Cirri, MD Chief Complaint: Cardiogenic Shock Subjective:    Felt extremely poor overnight. Persistent bradycardia in the 30-40s with intermittent pauses, longest 5s. Blood cultures positive for MSSE, started on Ancef . Coox 47. CO/CI (fick) 3.0/1.6. CVP 11. BP stable, MAP remain >80. Made 2.4L UOP with Lasix 80 mg bid. Renal and liver function stable.  Objective:    Vital Signs:   Temp:  [97.7 F (36.5 C)-102.7 F (39.3 C)] 97.8 F (36.6 C) (06/18 0340) Pulse Rate:  [30-166] 69 (06/18 0545) Resp:  [13-30] 18 (06/18 0645) BP: (65-166)/(31-120) 134/73 (06/18 0645) SpO2:  [68 %-100 %] 68 % (06/18 0545) Weight:  [80.5 kg] 80.5 kg (06/18 0437) Last BM Date : 07/04/23  Weight change: Filed Weights   07/03/23 0500 07/04/23 0452 07/05/23 0437  Weight: 78.5 kg 80.6 kg 80.5 kg   Intake/Output:  Intake/Output Summary (Last 24 hours) at 07/05/2023 0706 Last data filed at 07/05/2023 0640 Gross per 24 hour  Intake 2441.75 ml  Output 2400 ml  Net 41.75 ml    PHYSICAL EXAM: General: Weak appearing. No distress on  Cardiac: S1 and S2 present. No murmurs or rub. Extremities: Warm and wet.  Trace BLE edema.  Neuro: Alert and oriented x3. Affect pleasant.  Lines/Devices:  RUE PICC  Telemetry: Accelerated junctional 30-40s with pauses, longest 5s (personally reviewed)  Labs: Basic Metabolic Panel: Recent Labs  Lab 06/30/23 0429 06/30/23 0504 07/02/23 0434 07/02/23 1144 07/02/23 1226 07/03/23 0413 07/04/23 0354 07/04/23 1402 07/05/23 0427  NA  --    < > 128*   < > 125* 126* 123* 123* 120*  K  --    < > 3.6   < > 4.5 3.9 3.9 3.6 6.1*  CL  --    < > 91*  --   --  87* 85* 81* 83*  CO2  --    < > 28  --   --  29 28 27 25   GLUCOSE  --    < > 109*  --   --  109* 113* 226* 210*  BUN  --    < > 5*  --   --  7* 7* 9 15  CREATININE  --    < > 0.66  --   --  0.68 0.61 0.72 0.96  CALCIUM  --    < > 8.2*  --   --  8.5*  8.3* 8.5* 8.1*  MG 2.0  --  1.6*  --   --  2.0 2.0  --  2.3   < > = values in this interval not displayed.   Liver Function Tests: Recent Labs  Lab 06/29/23 0601 06/30/23 0424 07/01/23 0507 07/02/23 0434 07/04/23 0500  AST 97* 54* 33 23 27  ALT 109* 85* 62* 45* 35  ALKPHOS 47 52 54 55 74  BILITOT 1.5* 1.2 1.2 1.2 0.8  PROT 5.2* 4.9* 5.1* 5.1* 5.4*  ALBUMIN 2.7* 2.4* 2.5* 2.4* 2.4*   CBC: Recent Labs  Lab 07/01/23 0507 07/02/23 0434 07/02/23 1144 07/02/23 1201 07/02/23 1226 07/03/23 0413 07/04/23 0354 07/05/23 0427  WBC 7.5 8.7  --   --   --  8.8 7.6 15.4*  HGB 9.3* 9.1*   < > 8.8* 9.2* 9.4* 9.5* 10.3*  HCT 27.3* 27.0*   < > 26.0* 27.0* 27.4* 27.6* 30.1*  MCV 94.8 94.4  --   --   --  94.8 93.6 92.9  PLT 64*  76*  --   --   --  91* 100* 142*   < > = values in this interval not displayed.   BNP (last 3 results) Recent Labs    06/27/23 1020  BNP 543.0*   Medications:    Scheduled Medications:  aspirin  EC  81 mg Oral Daily   Chlorhexidine  Gluconate Cloth  6 each Topical Daily   feeding supplement  237 mL Oral TID BM   levothyroxine   50 mcg Oral Daily   lidocaine   1 patch Transdermal Q24H   multivitamin with minerals  1 tablet Oral Daily   polyethylene glycol  17 g Oral BID   senna  1 tablet Oral QHS   sertraline   100 mg Oral Daily   sodium chloride  flush  10-40 mL Intracatheter Q12H   sodium chloride  flush  3 mL Intravenous Q12H   sodium zirconium cyclosilicate  5 g Oral Once   Infusions:   ceFAZolin  (ANCEF ) IV Stopped (07/05/23 0335)   heparin  1,250 Units/hr (07/05/23 0600)   norepinephrine  (LEVOPHED ) Adult infusion 1 mcg/min (07/05/23 0600)   PRN Medications: sodium chloride , acetaminophen , HYDROmorphone  (DILAUDID ) injection, lip balm, ondansetron  (ZOFRAN ) IV, mouth rinse, oxyCODONE , sodium chloride  flush, sodium chloride  flush  Assessment/Plan:   1. Cardiogenic Shock, 2/2 Profound RV Failure   - cath 6/10 normal left-dominant system with  non-visualized tiny RCA - Echo LVEF 60-65% RV moderate HK with McConnell's sign - RHC 6/10 (on NE 15): RA 18, PA 35/21 (26), PCWP 19, CO/CI (TD) 2.9/1.6, PVR 0.8, PAPi 0.78 - CT w/o PE - s/p Impella RP placement. Removed 6/15. - POCUS ECHO 6/12 w/ improvement LVEF 60% RV mild HK  - Repeat formal echo today to eval RV.  - Coox 47%. Restart DBA 5. - CVP 11. IV Lasix 120 mg today. Follow renal function.  - Compete echo today to r/o infective endocarditis with MSSE - Worsened by bradycardia. Cath lab for TVP placement today. Will eventually need PPM once bacteremia clears  2. Acute diastolic HF - plan as above  3. NSTEMI with RV infarct - plan as above - continue ASA - no b-blocker with shock  4. Tachy-brady Syndrome - in/out AF in 120-130s and acclerated junctional in 40s. - overnight night persistently brady junctional rhythm in the 30s-40 with pause up to 5s - prev on bival with concern for HIT, SRA negative - continue heparin  - stop amio gtt - place TVP - Will eventually need PPM once bacteremia clears and closer to discharge   5. 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 Resolved   7. AKI: d/t ATN/shock. Resolved.   8. Hyponatremia - due to HF. Na 120 today - restrict FW  10. Thrombocytopenia - suspected HIT - heparin  switched to bival on 6/13. PLTs stabilized/improved off heparin  - SRA negative;  - continue heparin  gtt  11. Bacteremia - Lines removed. Bcx 6/17 grew MSSE. Repeat Bcx tomorrow. - WBC 15.4 this morning - On Ancef  - Formal echo to eval heart valves - ID following  12. Hyperkalemia - received additional supplementation ovenight - K 6.1 this morning - Give Lokelma 5 mg  - Repeat BMP this afternoon  Length of Stay: 54  Husband and son updated on plan at the  bedside.   CRITICAL CARE Performed by:  [place] Emaan Gary  Total critical care time: 13 minutes  -Critical care time was exclusive of separately billable procedures and treating other patients. -Critical care was necessary to treat or prevent imminent or life-threatening deterioration. -Critical care was time spent personally by me on the following activities: development of treatment plan with patient and/or surrogate as well as nursing, discussions with consultants, evaluation of patient's response to treatment, examination of patient, obtaining history from patient or surrogate, ordering and performing treatments and interventions, ordering and review of laboratory studies, ordering and review of radiographic studies, pulse oximetry and re-evaluation of patient's condition.  [place] Corleen Otwell, NP 07/05/2023, 7:06 AM  Advanced Heart Failure Team Pager [phone] (M-F; 7a - 5p)

## 2023-07-05 NOTE — Progress Notes (Signed)
 ANTICOAGULATION CONSULT NOTE  Pharmacy Consult for heparin >> bivalirudin >>heparin  Indication: Impella RP >Afib  Allergies  Allergen Reactions   Codeine Other (See Comments)    headache   Crestor [Rosuvastatin] Other (See Comments)    Muscle aches   Other Nausea And Vomiting    Anesthesia-severe vomiting   Simvastatin Other (See Comments)    Muscle aches   Egg White (Egg Protein)     Other Reaction(s): Throat closes   Ezetimibe      Other Reaction(s): crippling muscle aches   Hydrocodone-Acetaminophen      Other Reaction(s): migraines   Peanut (Diagnostic)     Other Reaction(s): itchy   Shellfish Allergy Other (See Comments)    Throat swelling   Statins     Patient Measurements: Height: 5' 3 (160 cm) Weight: 80.5 kg (177 lb 7.5 oz) IBW/kg (Calculated) : 52.4 Heparin  Dosing Weight: 68 kg   Vital Signs: Temp: 96.5 F (35.8 C) (06/18 1120) Temp Source: Axillary (06/18 1120) BP: 147/89 (06/18 1224) Pulse Rate: 87 (06/18 1224)  Labs: Recent Labs    07/03/23 0413 07/03/23 1432 07/03/23 2015 07/04/23 0354 07/04/23 1402 07/04/23 2040 07/05/23 0427 07/05/23 1200 07/05/23 1400  HGB 9.4*  --   --  9.5*  --   --  10.3*  --   --   HCT 27.4*  --   --  27.6*  --   --  30.1*  --   --   PLT 91*  --   --  100*  --   --  142*  --   --   APTT  --  65* 71* 68*  --   --   --   --   --   HEPARINUNFRC  --   --   --   --   --  0.11* 0.38  --  <0.10*  CREATININE 0.68  --   --  0.61 0.72  --  0.96 0.95  --     Estimated Creatinine Clearance: 53.7 mL/min (by C-G formula based on SCr of 0.95 mg/dL).   Assessment: 73 yo female in cardiogenic shock with RV failure s/p Impella RP 6/10.  Not on anticoagulation prior to admission.  Pharmacy consulted for heparin  dosing.  Chest CT w/ Type B dissection (small ulcerated plaque/developing intramural hematoma ~8 mm x 14 mm).   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  Discussed with team since HIT negative will change to heparin  infusion 6/17.    Heparin  level came back undetectable (<0.1). Hgb 10.3, plt 142. No s/sx of bleeding or infusion issues. Heparin  was paused for TVP placement today - RN restarting heparin  infusion now.  Goal of Therapy:  Heparin  level 0.3-0.7 units/ml Monitor platelets by anticoagulation protocol: Yes   Plan:  Restart heparin  infusion at 1250 units/hr on 6/18@1500  Heparin  level in 8 hours Monitor daily HL, CBC, and for s/sx of bleeding   Thank you for allowing pharmacy to participate in this patient's care,  Nieves Bars, PharmD, BCCCP Clinical Pharmacist  Phone: [phone] 07/05/2023 2:46 PM  Please check AMION for all [facility] Pharmacy phone numbers After 10:00 PM, call Main Pharmacy [phone]

## 2023-07-05 NOTE — Progress Notes (Signed)
 Nutrition Follow-up  DOCUMENTATION CODES:   Obesity unspecified  INTERVENTION:  Continue Multivitamin w/ minerals daily Encourage PO intake as able Discontinue Ensure Plus High Protein as pt does not like  NUTRITION DIAGNOSIS:  Increased nutrient needs related to acute illness as evidenced by estimated needs. - Ongoing  GOAL:  Patient will meet greater than or equal to 90% of their needs - Ongoing   MONITOR:  PO intake, Labs, Weight trends, I & O's  REASON FOR ASSESSMENT:  Consult Assessment of nutrition requirement/status, Poor PO  ASSESSMENT:  Pt with history of  normal cors on cardiac cath in 2017 and h/o HLD, presenting  with 3 day PTA history of severe chest pain. Found to be in cardiogenic shock w/ hypotension and bradycardia.  6/10 - RP impella placed, clear liquid diet 6/14 - full liquid diet, heart healthy duet  6/15 - Impella removed  RD unable to assess pt due to working remotely. Discussed with RN, pt continues with poor PO intake due to poor appetite and is refusing supplements due to not liking them. Reports pt also having nausea as well. RN shares that she has tried to discuss a Cortrak tube with pt but pt has declined.   Meal Intake 6/14: 0-5% x2 meals 6/15: 0% x1 meal 6/17: 0% x2 meals  Admission Weight: 72.6 kg Current Weight: 80.5 kg (6/18)  Nutrition Related Medications: NovoLog  0-15 units TID + 0-5 units daily, MVI, Miralax , Senna Drips Dobutamine  Heparin   Labs: Sodium 120, Potassium 5.7, Magnesium  2.3, Hgb A1c 4.9   UOP: 2400 mL x 24 hrs I/O's: -4.7L since admit  Diet Order:   Diet Order             Diet regular Fluid consistency: Thin; Fluid restriction: 2000 mL Fluid  Diet effective now                  EDUCATION NEEDS: No education needs have been identified at this time  Skin:  Skin Assessment: Reviewed RN Assessment  Last BM:  6/18  Height:  Ht Readings from Last 1 Encounters:  06/29/23 5' 3 (1.6 m)   Weight:  Wt  Readings from Last 1 Encounters:  07/05/23 80.5 kg   Ideal Body Weight:  52.3 kg  BMI:  Body mass index is 31.44 kg/m.  Estimated Nutritional Needs:  Kcal:  1650-1850 Protein:  85-100 grams Fluid:  1.6-1.8 L   Doneta Furbish RD, LDN Clinical Dietitian

## 2023-07-05 NOTE — Progress Notes (Signed)
 PHARMACY - PHYSICIAN COMMUNICATION CRITICAL VALUE ALERT - BLOOD CULTURE IDENTIFICATION (BCID)  Vicki [name] is an 73 y.o. female who presented to [facility] on 06/27/2023 with a chief complaint of chest pain  Assessment: Impella out 6/13, swan out 6/17  Name of physician (or Provider) Contacted: Dr. Ossie Blend   Current antibiotics: None  Changes to prescribed antibiotics recommended:  Start Cefazolin  2g IV q8h  Results for orders placed or performed during the hospital encounter of 06/27/23  Blood Culture ID Panel (Reflexed) (Collected: 07/04/2023 10:07 AM)  Result Value Ref Range   Enterococcus faecalis NOT DETECTED NOT DETECTED   Enterococcus Faecium NOT DETECTED NOT DETECTED   Listeria monocytogenes NOT DETECTED NOT DETECTED   Staphylococcus species DETECTED (A) NOT DETECTED   Staphylococcus aureus (BCID) NOT DETECTED NOT DETECTED   Staphylococcus epidermidis DETECTED (A) NOT DETECTED   Staphylococcus lugdunensis NOT DETECTED NOT DETECTED   Streptococcus species NOT DETECTED NOT DETECTED   Streptococcus agalactiae NOT DETECTED NOT DETECTED   Streptococcus pneumoniae NOT DETECTED NOT DETECTED   Streptococcus pyogenes NOT DETECTED NOT DETECTED   A.calcoaceticus-baumannii NOT DETECTED NOT DETECTED   Bacteroides fragilis NOT DETECTED NOT DETECTED   Enterobacterales NOT DETECTED NOT DETECTED   Enterobacter cloacae complex NOT DETECTED NOT DETECTED   Escherichia coli NOT DETECTED NOT DETECTED   Klebsiella aerogenes NOT DETECTED NOT DETECTED   Klebsiella oxytoca NOT DETECTED NOT DETECTED   Klebsiella pneumoniae NOT DETECTED NOT DETECTED   Proteus species NOT DETECTED NOT DETECTED   Salmonella species NOT DETECTED NOT DETECTED   Serratia marcescens NOT DETECTED NOT DETECTED   Haemophilus influenzae NOT DETECTED NOT DETECTED   Neisseria meningitidis NOT DETECTED NOT DETECTED   Pseudomonas aeruginosa NOT DETECTED NOT DETECTED   Stenotrophomonas maltophilia NOT DETECTED NOT  DETECTED   Candida albicans NOT DETECTED NOT DETECTED   Candida auris NOT DETECTED NOT DETECTED   Candida glabrata NOT DETECTED NOT DETECTED   Candida krusei NOT DETECTED NOT DETECTED   Candida parapsilosis NOT DETECTED NOT DETECTED   Candida tropicalis NOT DETECTED NOT DETECTED   Cryptococcus neoformans/gattii NOT DETECTED NOT DETECTED   Methicillin resistance mecA/C NOT DETECTED NOT DETECTED   Silvestre Drum, PharmD, BCPS Clinical Pharmacist Phone: [phone]

## 2023-07-06 ENCOUNTER — Inpatient Hospital Stay (HOSPITAL_COMMUNITY)

## 2023-07-06 DIAGNOSIS — R570 Cardiogenic shock: Secondary | ICD-10-CM | POA: Diagnosis not present

## 2023-07-06 DIAGNOSIS — I5031 Acute diastolic (congestive) heart failure: Secondary | ICD-10-CM

## 2023-07-06 DIAGNOSIS — R7881 Bacteremia: Secondary | ICD-10-CM | POA: Insufficient documentation

## 2023-07-06 DIAGNOSIS — B957 Other staphylococcus as the cause of diseases classified elsewhere: Secondary | ICD-10-CM | POA: Insufficient documentation

## 2023-07-06 HISTORY — DX: Bacteremia: R78.81

## 2023-07-06 LAB — BASIC METABOLIC PANEL WITH GFR
Anion gap: 10 (ref 5–15)
Anion gap: 12 (ref 5–15)
BUN: 20 mg/dL (ref 8–23)
BUN: 18 mg/dL (ref 8–23)
CO2: 27 mmol/L (ref 22–32)
CO2: 29 mmol/L (ref 22–32)
Calcium: 8.1 mg/dL — ABNORMAL LOW (ref 8.9–10.3)
Calcium: 8.3 mg/dL — ABNORMAL LOW (ref 8.9–10.3)
Chloride: 84 mmol/L — ABNORMAL LOW (ref 98–111)
Chloride: 84 mmol/L — ABNORMAL LOW (ref 98–111)
Creatinine, Ser: 0.82 mg/dL (ref 0.44–1.00)
Creatinine, Ser: 0.79 mg/dL (ref 0.44–1.00)
GFR, Estimated: >60 mL/min (ref >60)
GFR, Estimated: >60 mL/min (ref >60)
Glucose, Bld: 123 mg/dL — ABNORMAL HIGH (ref 70–99)
Glucose, Bld: 94 mg/dL (ref 70–99)
Potassium: 3.7 mmol/L (ref 3.5–5.1)
Potassium: 3.9 mmol/L (ref 3.5–5.1)
Sodium: 121 mmol/L — ABNORMAL LOW (ref 135–145)
Sodium: 125 mmol/L — ABNORMAL LOW (ref 135–145)

## 2023-07-06 LAB — COOXEMETRY PANEL
Carboxyhemoglobin: 1.7 % — ABNORMAL HIGH (ref 0.5–1.5)
Carboxyhemoglobin: 1.7 % — ABNORMAL HIGH (ref 0.5–1.5)
Methemoglobin: 0.7 % (ref 0.0–1.5)
Methemoglobin: <0.7 % (ref 0.0–1.5)
O2 Saturation: 55.9 %
O2 Saturation: 62.9 %
Total hemoglobin: 9.9 g/dL — ABNORMAL LOW (ref 12.0–16.0)
Total hemoglobin: 9.9 g/dL — ABNORMAL LOW (ref 12.0–16.0)

## 2023-07-06 LAB — ECHOCARDIOGRAM COMPLETE
Area-P 1/2: 2.99 cm2
Height: 63 in
Weight: 2765.45 [oz_av]

## 2023-07-06 LAB — MAGNESIUM: Magnesium: 2 mg/dL (ref 1.7–2.4)

## 2023-07-06 LAB — CBC
HCT: 26.8 % — ABNORMAL LOW (ref 36.0–46.0)
Hemoglobin: 9.3 g/dL — ABNORMAL LOW (ref 12.0–15.0)
MCH: 31.8 pg (ref 26.0–34.0)
MCHC: 34.7 g/dL (ref 30.0–36.0)
MCV: 91.8 fL (ref 80.0–100.0)
Platelets: 149 10*3/uL — ABNORMAL LOW (ref 150–400)
RBC: 2.92 MIL/uL — ABNORMAL LOW (ref 3.87–5.11)
RDW: 12.9 % (ref 11.5–15.5)
WBC: 12.6 10*3/uL — ABNORMAL HIGH (ref 4.0–10.5)
nRBC: 0 % (ref 0.0–0.2)

## 2023-07-06 LAB — HEPARIN LEVEL (UNFRACTIONATED): Heparin Unfractionated: 0.34 [IU]/mL (ref 0.30–0.70)

## 2023-07-06 MED ORDER — PANTOPRAZOLE SODIUM 40 MG PO TBEC
40.0000 mg | DELAYED_RELEASE_TABLET | Freq: Every day | ORAL | Status: DC
  Administered 2023-07-06 – 2023-07-14 (×9): 40 mg via ORAL
  Filled 2023-07-06 (×9): qty 1

## 2023-07-06 MED ORDER — FUROSEMIDE 10 MG/ML IJ SOLN
80.0000 mg | Freq: Once | INTRAMUSCULAR | Status: AC
  Administered 2023-07-06: 80 mg via INTRAVENOUS
  Filled 2023-07-06: qty 8

## 2023-07-06 MED ORDER — PERFLUTREN LIPID MICROSPHERE
1.0000 mL | INTRAVENOUS | Status: AC | PRN
  Administered 2023-07-06: 4 mL via INTRAVENOUS

## 2023-07-06 MED ORDER — POTASSIUM CHLORIDE CRYS ER 20 MEQ PO TBCR
40.0000 meq | EXTENDED_RELEASE_TABLET | Freq: Once | ORAL | Status: AC
  Administered 2023-07-06: 40 meq via ORAL
  Filled 2023-07-06: qty 2

## 2023-07-06 NOTE — Progress Notes (Incomplete)
 73 year old female with prior medical history as below who presented to the ED with chest pain associated with nausea with vomiting, lightheadedness and weakness/diaphoresis, seen at [facility] on 6/8 and diagnosed with A-fib, at ED found to be in A-fib with bradycardia and hypotension.  Found to have acute RV failure s/p Impella placement then removed.  CT with with small ulcerated plaque in the descending thoracic aorta (has been reviewed by cardiology with the CTS and VVS with changes 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)    Past Medical History:  Diagnosis Date   Alcohol intoxication (HCC) 05/2014    ED note    Anxiety    Arthritis    Boils    Chronic back pain    Community acquired pneumonia 01/05/2016   Depression    Fibromyalgia    GERD (gastroesophageal reflux disease)    Heart attack (HCC)    Hepatic cyst 01/06/2016   Hyperlipidemia    Hypertension    Insomnia    Leg cramps    Migraines    with aura   Mini stroke 05/2014   with paralysis for 1 hr   Osteopenia    PONV (postoperative nausea and vomiting)    severe   Postmenopausal bleeding 12/2015   PTSD (post-traumatic stress disorder)    S/P epidural steroid injection    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, passed out, 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 followed up with eye doctor no further issues   Spinal stenosis    Squamous cell carcinoma 12/25/2015   right leg and nose   Strep throat 01/05/2016   Stroke (HCC)    3 strokes, no residual   Past Surgical History:  Procedure Laterality Date   BREAST ENHANCEMENT SURGERY     CARDIAC CATHETERIZATION N/A 08/12/2015   Procedure: Left Heart Cath and Coronary Angiography;  Surgeon: Lucendia Rusk, MD;  Location: [facility] INVASIVE CV LAB;  Service: Cardiovascular;  Laterality: N/A;   COLONOSCOPY     DILATATION & CURETTAGE/HYSTEROSCOPY WITH MYOSURE N/A 02/22/2016   Procedure: DILATATION & CURETTAGE/HYSTEROSCOPY WITH MYOSURE;  Surgeon: Greta Leatherwood, MD;  Location: [facility] ORS;  Service: Gynecology;   Laterality: N/A;   FACIAL COSMETIC SURGERY     HAND SURGERY     nerve & tendon repair   LIPOSUCTION     LUMBAR EPIDURAL INJECTION  06/16/2015   RIGHT/LEFT HEART CATH AND CORONARY ANGIOGRAPHY N/A 06/27/2023   Procedure: RIGHT/LEFT HEART CATH AND CORONARY ANGIOGRAPHY;  Surgeon: Mardell Shade, MD;  Location: [facility] INVASIVE CV LAB;  Service: Cardiovascular;  Laterality: N/A;   SKIN BIOPSY Right 02/22/2016   Procedure: BIOPSY SKIN;  Surgeon: Greta Leatherwood, MD;  Location: [facility] ORS;  Service: Gynecology;  Laterality: Right;   SQUAMOUS CELL CARCINOMA EXCISION     VENTRICULAR ASSIST DEVICE INSERTION N/A 06/27/2023   Procedure: VENTRICULAR ASSIST DEVICE INSERTION;  Surgeon: Mardell Shade, MD;  Location: [facility] INVASIVE CV LAB;  Service: Cardiovascular;  Laterality: N/A;   WISDOM TOOTH EXTRACTION

## 2023-07-06 NOTE — Progress Notes (Signed)
 ANTICOAGULATION CONSULT NOTE  Pharmacy Consult for heparin >> bivalirudin >>heparin  Indication: Impella RP >Afib  Allergies  Allergen Reactions   Codeine Other (See Comments)    headache   Crestor [Rosuvastatin] Other (See Comments)    Muscle aches   Other Nausea And Vomiting    Anesthesia-severe vomiting   Simvastatin Other (See Comments)    Muscle aches   Egg White (Egg Protein)     Other Reaction(s): Throat closes   Ezetimibe      Other Reaction(s): crippling muscle aches   Hydrocodone-Acetaminophen      Other Reaction(s): migraines   Peanut (Diagnostic)     Other Reaction(s): itchy   Shellfish Allergy Other (See Comments)    Throat swelling   Statins     Patient Measurements: Height: 5' 3 (160 cm) Weight: 78.4 kg (172 lb 13.5 oz) IBW/kg (Calculated) : 52.4 Heparin  Dosing Weight: 68 kg   Vital Signs: Temp: 97.9 F (36.6 C) (06/19 0326) Temp Source: Oral (06/18 2336) BP: 89/57 (06/19 0600) Pulse Rate: 79 (06/19 0600)  Labs: Recent Labs     0000 07/03/23 1432 07/03/23 2015 07/04/23 0354 07/04/23 1402 07/05/23 0427 07/05/23 1200 07/05/23 1400 07/05/23 1810 07/05/23 2245 07/06/23 0430  HGB   < >  --   --  9.5*  --  10.3*  --   --   --   --  9.3*  HCT  --   --   --  27.6*  --  30.1*  --   --   --   --  26.8*  PLT  --   --   --  100*  --  142*  --   --   --   --  149*  APTT  --  65* 71* 68*  --   --   --   --   --   --   --   HEPARINUNFRC  --   --   --   --    < > 0.38  --  <0.10*  --  0.30 0.34  CREATININE  --   --   --  0.61   < > 0.96 0.95  --  0.86  --  0.82   < > = values in this interval not displayed.    Estimated Creatinine Clearance: 61.5 mL/min (by C-G formula based on SCr of 0.82 mg/dL).   Assessment: 73 yo female in cardiogenic shock with RV failure s/p Impella RP 6/10.  Not on anticoagulation prior to admission.  Pharmacy consulted for heparin  dosing.  Chest CT w/ Type B dissection (small ulcerated plaque/developing intramural hematoma ~8  mm x 14 mm).   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  Discussed with team since HIT negative will change to heparin  infusion 6/17.   Heparin  level 0.34 at goal on 1250 units/hr.  Hgb 9.3, pltc 149 stable. No s/sx of bleeding or infusion issues.   Goal of Therapy:  Heparin  level 0.3-0.7 units/ml Monitor platelets by anticoagulation protocol: Yes   Plan:  Continue heparin  IV 1250 units/hr  Monitor daily HL, CBC, and for s/sx of bleeding   Thank you for allowing pharmacy to participate in this patient's care,  Vicki [name], PharmD Clinical Pharmacist 07/06/2023  7:34 AM

## 2023-07-06 NOTE — Progress Notes (Signed)
 Echocardiogram 2D Echocardiogram has been performed.  Vicki [name] RDCS 07/06/2023, 10:08 AM

## 2023-07-06 NOTE — Progress Notes (Signed)
 OT Cancellation Note  Patient Details Name: Vicki [name] MRN: [id number] DOB: Aug 10, 1950   Cancelled Treatment:    Reason Eval/Treat Not Completed: Medical issues which prohibited therapy. Pt with new internal jugular TVP and HR which drops in the 20s. Will continue to follow.   Jonette Nestle 07/06/2023, 11:10 AM [name], OTR/L Acute Rehabilitation Services Office: [phone]

## 2023-07-06 NOTE — Progress Notes (Signed)
 Advanced Heart Failure Rounding Note  HF Cardiologist: Carollynn Cirri, MD Chief Complaint: Cardiogenic Shock Subjective:    Coox 56%. Predominantly V pacing with TVP. Underlying NSR in 70s 1.3L UOP (net - 500cc). Cr stable WBC 15.4>12.6 On Ancef .  Lying in bed. Feeling much better this morning. Color returned to complexion. Husband at bedside.  Objective:    Vital Signs:   Temp:  [96.5 F (35.8 C)-98.7 F (37.1 C)] 97.9 F (36.6 C) (06/19 0326) Pulse Rate:  [34-135] 79 (06/19 0600) Resp:  [12-31] 25 (06/19 0600) BP: (84-165)/(41-110) 89/57 (06/19 0600) SpO2:  [68 %-100 %] 94 % (06/19 0600) Weight:  [78.4 kg] 78.4 kg (06/19 0500) Last BM Date : 07/05/23  Weight change: Filed Weights   07/04/23 0452 07/05/23 0437 07/06/23 0500  Weight: 80.6 kg 80.5 kg 78.4 kg   Intake/Output:  Intake/Output Summary (Last 24 hours) at 07/06/2023 0731 Last data filed at 07/06/2023 0620 Gross per 24 hour  Intake 833.64 ml  Output 1350 ml  Net -516.36 ml    PHYSICAL EXAM: CVP 9 General: Well appearing. No distress on RA Cardiac: S1 and S2 present. No murmurs or rub. Extremities: Warm and dry.  Trace BLE edema.  Neuro: Alert and oriented x3. Affect pleasant. Moves all extremities without difficulty. Lines/Devices:  RIJ TVP  Telemetry: VP 80s, freq PVCs (personally reviewed)  Labs: Basic Metabolic Panel: Recent Labs  Lab 07/02/23 0434 07/02/23 1144 07/03/23 0413 07/04/23 0354 07/04/23 1402 07/05/23 0427 07/05/23 0730 07/05/23 1200 07/05/23 1810 07/06/23 0430  NA 128*   < > 126* 123* 123* 120*  --  121* 122* 121*  K 3.6   < > 3.9 3.9 3.6 6.1* 5.7* 5.2* 3.9 3.7  CL 91*  --  87* 85* 81* 83*  --  84* 81* 84*  CO2 28  --  29 28 27 25   --  20* 28 27  GLUCOSE 109*  --  109* 113* 226* 210*  --  124* 210* 123*  BUN 5*  --  7* 7* 9 15  --  19 17 20   CREATININE 0.66  --  0.68 0.61 0.72 0.96  --  0.95 0.86 0.82  CALCIUM 8.2*  --  8.5* 8.3* 8.5* 8.1*  --  14.5* 8.8* 8.1*  MG 1.6*   --  2.0 2.0  --  2.3  --   --   --  2.0   < > = values in this interval not displayed.   Liver Function Tests: Recent Labs  Lab 06/30/23 0424 07/01/23 0507 07/02/23 0434 07/04/23 0500  AST 54* 33 23 27  ALT 85* 62* 45* 35  ALKPHOS 52 54 55 74  BILITOT 1.2 1.2 1.2 0.8  PROT 4.9* 5.1* 5.1* 5.4*  ALBUMIN 2.4* 2.5* 2.4* 2.4*   CBC: Recent Labs  Lab 07/02/23 0434 07/02/23 1144 07/02/23 1226 07/03/23 0413 07/04/23 0354 07/05/23 0427 07/06/23 0430  WBC 8.7  --   --  8.8 7.6 15.4* 12.6*  HGB 9.1*   < > 9.2* 9.4* 9.5* 10.3* 9.3*  HCT 27.0*   < > 27.0* 27.4* 27.6* 30.1* 26.8*  MCV 94.4  --   --  94.8 93.6 92.9 91.8  PLT 76*  --   --  91* 100* 142* 149*   < > = values in this interval not displayed.   BNP (last 3 results) Recent Labs    06/27/23 1020  BNP 543.0*   Medications:    Scheduled Medications:  aspirin  EC  81 mg Oral Daily   Chlorhexidine  Gluconate Cloth  6 each Topical Daily   levothyroxine   50 mcg Oral Daily   lidocaine   1 patch Transdermal Q24H   multivitamin with minerals  1 tablet Oral Daily   polyethylene glycol  17 g Oral BID   senna  1 tablet Oral QHS   sertraline   100 mg Oral Daily   sodium chloride  flush  10-40 mL Intracatheter Q12H   sodium chloride  flush  3 mL Intravenous Q12H   Infusions:   ceFAZolin  (ANCEF ) IV Stopped (07/06/23 0549)   DOBUTamine  5 mcg/kg/min (07/06/23 0600)   heparin  1,250 Units/hr (07/06/23 0600)   norepinephrine  (LEVOPHED ) Adult infusion Stopped (07/05/23 0718)   PRN Medications: sodium chloride , acetaminophen , HYDROmorphone  (DILAUDID ) injection, lip balm, ondansetron  (ZOFRAN ) IV, mouth rinse, oxyCODONE , sodium chloride  flush, sodium chloride  flush  Assessment/Plan:   Cardiogenic Shock, 2/2 Profound RV Failure   - cath 6/10 normal left-dominant system with non-visualized tiny RCA - Echo LVEF 60-65% RV moderate HK with McConnell's sign - RHC 6/10 (on NE 15): RA 18, PA 35/21 (26), PCWP 19, CO/CI (TD) 2.9/1.6, PVR 0.8,  PAPi 0.78 - CT w/o PE - s/p Impella RP placement. Removed 6/15. - POCUS ECHO 6/12 w/ improvement LVEF 60% RV mild HK  - Coox 56%. Continue DBA 5.  - Give IV Lasix 80 gm today. - Compete echo today to r/o infective endocarditis with MSSE and eval RV - TVP in place overnight; rate turned down to 50 this morning. In NSR in 60s.  Acute diastolic HF - plan as above  NSTEMI with RV infarct - plan as above - continue ASA - no b-blocker with shock  Tachy-brady Syndrome - in/out AF in 120-130s and acclerated junctional in 40s. 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 Na 121 today - restrict FW  Thrombocytopenia - heparin  switched to bival on 6/13. PLTs stabilized/improved off heparin  - SRA negative - continue heparin  gtt - PLT slowly coming up  Bacteremia - Lines removed. Bcx 6/17 grew MSSE.  - WBC coming down 15.4>12.6 - On Ancef  - Formal echo to eval heart valves - ID following; appreciate recs - Repeat blood cultures today  Hyperkalemia: resolved Shock Liver: resolved AKI: resolved  Length of Stay: 28  Husband and son updated on plan at the bedside.   CRITICAL CARE Performed by: [place] Chanette Demo  Total critical care time: 13 minutes  -Critical care time was exclusive of separately billable procedures and treating other patients. -Critical care was necessary to treat or prevent imminent or life-threatening deterioration. -Critical care was time spent personally by me on the following activities: development of treatment  plan with patient and/or surrogate as well as nursing, discussions with consultants, evaluation of patient's response to treatment, examination of patient, obtaining history from patient or surrogate, ordering and performing treatments and interventions, ordering and review of laboratory studies, ordering and review of radiographic studies, pulse oximetry and re-evaluation of patient's condition.  [place] Reyes Fifield, NP 07/06/2023, 7:31 AM  Advanced Heart Failure Team Pager [phone] (M-F; 7a - 5p)

## 2023-07-06 NOTE — Consult Note (Addendum)
 I have seen and examined the patient. I have personally reviewed the clinical findings, laboratory findings, microbiological data and imaging studies. The assessment and treatment plan was discussed with the Nurse Practitioner. I agree with her/his recommendations except following additions/corrections.  Exam -adult female lying in the bed, not in acute distress, RRR, S1 and S2, normal breath sounds, abdomen nondistended, soft, no signs of septic peripheral joint, no rashes, awake alert and oriented grossly nonfocal.  PICC line in right IJ sheath with no signs of infection, Rt chest temp pacer C/D/I  # Nosocomial MSSE bacteremia  - PICC line and Pulmonary artery catheter placed after blood cx on 6/17 and unlikely source  Appears to have arterial line as well as impella prior to blood cx and could be related to bacteremia. Blood cx on admit 6/10 NG. 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 Dennise here this weekend.    I spent 80 minutes involved in face-to-face and non-face-to-face activities for this patient on the day of the visit. Professional time spent includes the following activities: Preparing to see the patient (review of tests), Obtaining and reviewing separately obtained history (admission/discharge record),  Performing a medically appropriate examination and evaluation , Ordering medications/labs, referring and communicating with other health care professionals, Documenting clinical information in the EMR, Independently interpreting results (not separately reported), Communicating results to the patient/family, Counseling and educating the patient/family and Care coordination (not separately reported).  Electronically signed by:       [facility]    Date of Admission:  06/27/2023     Total days of antibiotics 3  Cefazolin  day 3               Reason for Consult: Staphylococcus epidermidis bacteremia in ICU patient     Referring Provider: Zenaida Primary Care Provider: Claudene Pellet, MD   Assessment: Vicki [name] is a 73 y.o. female admitted from home with malaise, weakness, tearing chest pains and found to have type b dissection, acute NSTEMI and RV failure.    Staphylococcus Epidermidis Bacteremia -  Nosocomial -  ID asked to eval for high burden Staphylococcus Epidermidis bacteremia in ICU patient on hospital day 8 with multiple lines and medical support devices. She had very high fever to nearly 103 F on 6/17 with leukocytosis. This has improved since starting cefazolin  IV. Most likely seems line/device related. The aortic ulceration is concerning, however she was not bacteremia with blood cultures drawn at admission and w/o specific infectious symptoms described PTA. Has some ongoing nausea today but largely feels improved.  - continue IV antibiotics - will have micro work up all sites with susceptibilities  - FU TTE - Repeat BCx  - Removal of lines as she is able to afford  - Duration pending further work up.   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   Transaminitis -  Congestive hepatopathy, improving   Plan: Continue cefazolin  Follow micro through maturity and ID/susceptibilities  Repeat BCx today  FU TTE    Principal Problem:   Cardiogenic shock (HCC) Active Problems:   Coagulase negative Staphylococcus bacteremia    aspirin  EC  81 mg Oral Daily   Chlorhexidine  Gluconate Cloth  6 each Topical Daily   levothyroxine   50 mcg Oral Daily   lidocaine   1 patch Transdermal Q24H   multivitamin with minerals  1 tablet Oral Daily   pantoprazole   40 mg Oral Daily   polyethylene glycol  17 g Oral BID   senna  1 tablet Oral QHS   sertraline   100 mg Oral Daily   sodium chloride  flush  10-40 mL Intracatheter Q12H   sodium chloride  flush  3 mL Intravenous Q12H    HPI: Vicki [name] is a 73 y.o. female admitted on 6/10 from home with Type B aortic dissection, cardiogenic shock 2/2 RV failure.   PMHx: MI, CVA, fibromyalgia, seizures, PTSD  Chart review -  3d history of tearing chest pains prior to admission that was substernal and radiated to back. Came in with SBP in 60s and junctional bradycardia/AFL. Emergent CT scan with Type B dissection of distal descending aorta with small ulcerated plaque/intramural hematoma. Started on pressors and taken to ICU. CCM Consulted  Vascular consulted as well as TCTS re: aortic abnormality - 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 in nature.  HF team consulted with profound shock - RP impella was placed to support RV. NO PE noted on scans. Initial BCx on 6/10 with fever/leukocytosis were no growth. WBC normalized.   New fever to 103 and leukocytosis 15.4K on hospital day 8. Blood cultures collected again and revealed staphylococcus epidermidis on BCID with other site growing out gram positive cocci yet to be ID'd   She states that she does not recall much about initial visit to ER. Just that she felt terribly bad,  fatigued, nausea/vomiting whenever she tried to eat or drink. Does not recall any fevers at all.  During exam she had transthoracic echo completed.  Her husband is at the bedside as well She says that today is the best she has felt since her initial admission to hospital.   Has breast implants in place that have not had any trouble outside of some discomfort with last mammogram. Denies any other prosthetic joints or cardiac devices.  She has a temp pacer in place Rt internal jugular, Rt arm PICC line, Arterial line   Review of Systems: Review of Systems  Constitutional:  Positive for fever (in hospital, not PTA).  Eyes:  Negative for blurred vision and double vision.  Respiratory:  Negative for cough.   Cardiovascular:  Positive for leg swelling (transient at night that cleared by the day). Negative for chest pain.  Gastrointestinal:  Positive for nausea. Negative for abdominal pain, diarrhea and vomiting.  Skin:  Negative for itching and rash.    Past Medical History:  Diagnosis Date   Alcohol intoxication (HCC) 05/2014    ED note    Anxiety    Arthritis  Boils    Chronic back pain    Community acquired pneumonia 01/05/2016   Depression    Fibromyalgia    GERD (gastroesophageal reflux disease)    Heart attack (HCC)    Hepatic cyst 01/06/2016   Hyperlipidemia    Hypertension    Insomnia    Leg cramps    Migraines    with aura   Mini stroke 05/2014   with paralysis for 1 hr   Osteopenia    PONV (postoperative nausea and vomiting)    severe   Postmenopausal bleeding 12/2015   PTSD (post-traumatic stress disorder)    S/P epidural steroid injection    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, passed out, 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 followed up with eye doctor no further issues   Spinal stenosis    Squamous cell carcinoma 12/25/2015   right leg and nose   Strep throat 01/05/2016   Stroke (HCC)    3 strokes, no residual   Past Surgical History:  Procedure Laterality Date    BREAST ENHANCEMENT SURGERY     CARDIAC CATHETERIZATION N/A 08/12/2015   Procedure: Left Heart Cath and Coronary Angiography;  Surgeon: Candyce GORMAN Reek, MD;  [facility] INVASIVE CV LAB;  Service: Cardiovascular;  Laterality: N/A;   COLONOSCOPY     DILATATION & CURETTAGE/HYSTEROSCOPY WITH MYOSURE N/A 02/22/2016   Procedure: DILATATION & CURETTAGE/HYSTEROSCOPY WITH MYOSURE;  Surgeon: Bobie FORBES Cathlyn JAYSON Nikki, MD;  Location: [facility] ORS;  Service: Gynecology;  Laterality: N/A;   FACIAL COSMETIC SURGERY     HAND SURGERY     nerve & tendon repair   LIPOSUCTION     LUMBAR EPIDURAL INJECTION  06/16/2015   RIGHT/LEFT HEART CATH AND CORONARY ANGIOGRAPHY N/A 06/27/2023   Procedure: RIGHT/LEFT HEART CATH AND CORONARY ANGIOGRAPHY;  Surgeon: Cherrie Toribio SAUNDERS, MD;  Location: [facility] INVASIVE CV LAB;  Service: Cardiovascular;  Laterality: N/A;   SKIN BIOPSY Right 02/22/2016   Procedure: BIOPSY SKIN;  Surgeon: Bobie FORBES Cathlyn JAYSON Nikki, MD;  Location: [facility] OR[facility];  Service: Gynecology;  Laterality: Right;   SQUAMOUS CELL CARCINOMA EXCISION     VENTRICULAR ASSIST DEVICE INSERTION N/A 06/27/2023   Procedure: VENTRICULAR ASSIST DEVICE INSERTION;  Surgeon: Cherrie Toribio SAUNDERS, MD;  Location: [facility] INVASIVE CV LAB;  Service: Cardiovascular;  Laterality: N/A;   WISDOM TOOTH EXTRACTION       Social History   Tobacco Use   Smoking status: Former   Smokeless tobacco: Never  Vaping Use   Vaping status: Never Used  Substance Use Topics   Alcohol use: No   Drug use: No    Family History  Problem Relation Age of Onset   Stroke Father    Depression Father    Stroke Mother    Hypertension Brother    Heart disease Brother        stint...PACER   Heart attack Brother    CAD Brother    Heart attack Maternal Uncle    Heart attack Maternal Grandmother    Heart attack Brother    Non-Hodgkin's lymphoma Brother    CAD Brother    Heart attack Maternal Uncle    Heart attack Maternal Uncle    Heart attack Maternal Uncle     Heart attack Maternal Uncle    Cholesteatoma Sister    Hyperlipidemia Sister    Other Brother        SUICIDE 05/2016   Allergies  Allergen Reactions   Codeine Other (See Comments)  headache   Crestor [Rosuvastatin] Other (See Comments)    Muscle aches   Other Nausea And Vomiting    Anesthesia-severe vomiting   Simvastatin Other (See Comments)    Muscle aches   Egg White (Egg Protein)     Other Reaction(s): Throat closes   Ezetimibe      Other Reaction(s): crippling muscle aches   Hydrocodone-Acetaminophen      Other Reaction(s): migraines   Peanut (Diagnostic)     Other Reaction(s): itchy   Shellfish Allergy Other (See Comments)    Throat swelling   Statins     OBJECTIVE: Blood pressure 124/64, pulse 66, temperature (!) 97.5 F (36.4 C), temperature source Oral, resp. rate 15, height 5' 3 (1.6 m), weight 78.4 kg, last menstrual period 07/18/2011, SpO2 94%.  Physical Exam Constitutional:      Appearance: She is well-developed. She is not ill-appearing.  Neck:     Comments: Rt internal jugular temp pacer clean and dressed, non-tender  Cardiovascular:     Rate and Rhythm: Bradycardia present.     Heart sounds: Normal heart sounds.     Comments: Intermittent pacing spikes on telemetry - HR at rest 50-60.  Pulmonary:     Effort: Pulmonary effort is normal.   Skin:    General: Skin is warm and dry.     Capillary Refill: Capillary refill takes less than 2 seconds.     Findings: No rash.   Neurological:     General: No focal deficit present.     Mental Status: She is alert and oriented to person, place, and time.     Lab Results Lab Results  Component Value Date   WBC 12.6 (H) 07/06/2023   HGB 9.3 (L) 07/06/2023   HCT 26.8 (L) 07/06/2023   MCV 91.8 07/06/2023   PLT 149 (L) 07/06/2023    Lab Results  Component Value Date   CREATININE 0.82 07/06/2023   BUN 20 07/06/2023   NA 121 (L) 07/06/2023   K 3.7 07/06/2023   CL 84 (L) 07/06/2023   CO2 27 07/06/2023     Lab Results  Component Value Date   ALT 35 07/04/2023   AST 27 07/04/2023   ALKPHOS 74 07/04/2023   BILITOT 0.8 07/04/2023     Microbiology: Recent Results (from the past 240 hours)  Culture, blood (routine x 2)     Status: None   Collection Time: 06/27/23 12:00 PM   Specimen: BLOOD LEFT HAND  Result Value Ref Range Status   Specimen Description BLOOD LEFT HAND  Final   Special Requests   Final    BOTTLES DRAWN AEROBIC ONLY Blood Culture adequate volume   Culture   Final    NO GROWTH 5 DAYS Performed at [facility], [address], [place], [place] [address]    Report Status 07/02/2023 FINAL  Final  MRSA Next Gen by PCR, Nasal     Status: None   Collection Time: 06/27/23  4:24 PM   Specimen: Nasal Mucosa; Nasal Swab  Result Value Ref Range Status   MRSA by PCR Next Gen NOT DETECTED NOT DETECTED Final    Comment: (NOTE) The GeneXpert MRSA Assay (FDA approved for NASAL specimens only), is one component of a comprehensive MRSA colonization surveillance program. It is not intended to diagnose MRSA infection nor to guide or monitor treatment for MRSA infections. Test performance is not FDA approved in patients less than 73 years old. Performed at [facility], [address],  [place], [place] [address]   Culture, blood (routine x 2)     Status: None   Collection Time: 06/27/23  5:32 PM   Specimen: BLOOD LEFT HAND  Result Value Ref Range Status   Specimen Description BLOOD LEFT HAND  Final   Special Requests   Final    BOTTLES DRAWN AEROBIC AND ANAEROBIC Blood Culture results may not be optimal due to an inadequate volume of blood received in culture bottles   Culture   Final    NO GROWTH 5 DAYS Performed at [facility], [address], [place], [place] [address]    Report Status 07/02/2023 FINAL  Final  Culture, blood (Routine X 2) w Reflex to ID Panel     Status: Abnormal (Preliminary result)   Collection Time: 07/04/23 10:07 AM   Specimen: BLOOD  LEFT HAND  Result Value Ref Range Status   Specimen Description BLOOD LEFT HAND  Final   Special Requests   Final    BOTTLES DRAWN AEROBIC AND ANAEROBIC Blood Culture adequate volume   Culture  Setup Time   Final    GRAM POSITIVE COCCI IN CLUSTERS IN BOTH AEROBIC AND ANAEROBIC BOTTLES CRITICAL RESULT CALLED TO, READ BACK BY AND VERIFIED WITH: PHARMD J LEDFORD 07/05/2023 @ 0205 BY AB Performed at [facility], [address], [place], [place] [address]    Culture STAPHYLOCOCCUS EPIDERMIDIS (A)  Final   Report Status PENDING  Incomplete  Culture, blood (Routine X 2) w Reflex to ID Panel     Status: Abnormal (Preliminary result)   Collection Time: 07/04/23 10:07 AM   Specimen: BLOOD RIGHT HAND  Result Value Ref Range Status   Specimen Description BLOOD RIGHT HAND  Final   Special Requests   Final    BOTTLES DRAWN AEROBIC AND ANAEROBIC Blood Culture adequate volume   Culture  Setup Time   Final    GRAM POSITIVE COCCI IN CLUSTERS IN BOTH AEROBIC AND ANAEROBIC BOTTLES CRITICAL VALUE NOTED.  VALUE IS CONSISTENT WITH PREVIOUSLY REPORTED AND CALLED VALUE. Performed at [facility], [address] [place], [place] [address]    Culture STAPHYLOCOCCUS EPIDERMIDIS (A)  Final   Report Status PENDING  Incomplete  Blood Culture ID Panel (Reflexed)     Status: Abnormal   Collection Time: 07/04/23 10:07 AM  Result Value Ref Range Status   Enterococcus faecalis NOT DETECTED NOT DETECTED Final   Enterococcus Faecium NOT DETECTED NOT DETECTED Final   Listeria monocytogenes NOT DETECTED NOT DETECTED Final   Staphylococcus species DETECTED (A) NOT DETECTED Final    Comment: CRITICAL RESULT CALLED TO, READ BACK BY AND VERIFIED WITH: PHARMD J LEDFORD 07/05/2023 @ 0205 BY AB    Staphylococcus aureus (BCID) NOT DETECTED NOT DETECTED Final   Staphylococcus epidermidis DETECTED (A) NOT DETECTED Final    Comment: CRITICAL RESULT CALLED TO, READ BACK BY AND VERIFIED WITH: PHARMD J LEDFORD  07/05/2023 @ 0205 BY AB    Staphylococcus lugdunensis NOT DETECTED NOT DETECTED Final   Streptococcus species NOT DETECTED NOT DETECTED Final   Streptococcus agalactiae NOT DETECTED NOT DETECTED Final   Streptococcus pneumoniae NOT DETECTED NOT DETECTED Final   Streptococcus pyogenes NOT DETECTED NOT DETECTED Final   A.calcoaceticus-baumannii NOT DETECTED NOT DETECTED Final   Bacteroides fragilis NOT DETECTED NOT DETECTED Final   Enterobacterales NOT DETECTED NOT DETECTED Final   Enterobacter cloacae complex NOT DETECTED NOT DETECTED Final   Escherichia coli NOT DETECTED NOT DETECTED Final   Klebsiella  aerogenes NOT DETECTED NOT DETECTED Final   Klebsiella oxytoca NOT DETECTED NOT DETECTED Final   Klebsiella pneumoniae NOT DETECTED NOT DETECTED Final   Proteus species NOT DETECTED NOT DETECTED Final   Salmonella species NOT DETECTED NOT DETECTED Final   Serratia marcescens NOT DETECTED NOT DETECTED Final   Haemophilus influenzae NOT DETECTED NOT DETECTED Final   Neisseria meningitidis NOT DETECTED NOT DETECTED Final   Pseudomonas aeruginosa NOT DETECTED NOT DETECTED Final   Stenotrophomonas maltophilia NOT DETECTED NOT DETECTED Final   Candida albicans NOT DETECTED NOT DETECTED Final   Candida auris NOT DETECTED NOT DETECTED Final   Candida glabrata NOT DETECTED NOT DETECTED Final   Candida krusei NOT DETECTED NOT DETECTED Final   Candida parapsilosis NOT DETECTED NOT DETECTED Final   Candida tropicalis NOT DETECTED NOT DETECTED Final   Cryptococcus neoformans/gattii NOT DETECTED NOT DETECTED Final   Methicillin resistance mecA/C NOT DETECTED NOT DETECTED Final    Comment: Performed at [facility], [address], [place], [place] [address]   Imaging  CARDIAC CATHETERIZATION Result Date: 07/06/2023 Successful placement of temporary dialysis catheter   DG CHEST PORT 1 VIEW Result Date: 07/06/2023 CLINICAL DATA:  Chest pain, cardiogenic shock, heart failure EXAM: PORTABLE  CHEST 1 VIEW COMPARISON:  07/04/2023 FINDINGS: Single frontal view of the chest demonstrates right-sided PICC tip overlying superior vena cava. Right internal jugular catheter tip overlies the main pulmonary outflow tract. Additional radiopaque wire overlying the left chest is likely external to the patient. Cardiac silhouette is stable. Stable elevated right hemidiaphragm. No airspace disease, effusion, or pneumothorax. IMPRESSION: 1. Support devices as above. 2. Elevated right hemidiaphragm.  No acute airspace disease. Electronically Signed   By: Ozell Daring M.D.   On: 07/06/2023 14:23   ECHOCARDIOGRAM COMPLETE Result Date: 07/06/2023    ECHOCARDIOGRAM REPORT   Patient Name:   KAHLEE METIVIER Date of Exam: 07/06/2023 Medical Rec #:  [id number]       Height:       63.0 in Accession #:    [id number]      Weight:       172.8 lb Date of Birth:  Sep 16, 1950       BSA:          1.817 m Patient Age:    73 years        BP:           97/71 mmHg Patient Gender: F               HR:           70 bpm. Exam Location:  Inpatient Procedure: 2D Echo, Color Doppler, Cardiac Doppler and Intracardiac            Opacification Agent (Both Spectral and Color Flow Doppler were            utilized during procedure). Indications:     I50.9* Heart failure (unspecified)  History:         Patient has prior history of Echocardiogram examinations, most                  recent 06/27/2023. Risk Factors:Hypertension, Dyslipidemia and                  ETOH.  Sonographer:     Damien Senior RDCS Referring Phys:  SWAZILAND LEE Diagnosing Phys: Ria Commander  Sonographer Comments: Technically difficult due to breast implants and respiratory motion IMPRESSIONS  1. Technically difficult study.  2.  Left ventricular ejection fraction, by estimation, is 60 to 65%. The left ventricle has normal function. The left ventricle has no regional wall motion abnormalities. Left ventricular diastolic parameters were normal.  3. Right ventricular systolic function is  normal. The right ventricular size is mildly enlarged. A Temporary pacemaker lead is visualized. Tricuspid regurgitation signal is inadequate for assessing PA pressure.  4. A small pericardial effusion is present.  5. The mitral valve is grossly normal. No evidence of mitral valve regurgitation.  6. The aortic valve has an indeterminant number of cusps. Aortic valve regurgitation is not visualized.  7. The inferior vena cava is normal in size with <50% respiratory variability, suggesting right atrial pressure of 8 mmHg. FINDINGS  Left Ventricle: Left ventricular ejection fraction, by estimation, is 60 to 65%. The left ventricle has normal function. The left ventricle has no regional wall motion abnormalities. Definity  contrast agent was given IV to delineate the left ventricular  endocardial borders. The left ventricular internal cavity size was normal in size. Suboptimal image quality limits for assessment of left ventricular hypertrophy. Left ventricular diastolic parameters were normal. Right Ventricle: The right ventricular size is mildly enlarged. No increase in right ventricular wall thickness. Right ventricular systolic function is normal. Tricuspid regurgitation signal is inadequate for assessing PA pressure. Left Atrium: Left atrial size was normal in size. Right Atrium: Right atrial size was normal in size. Pericardium: A small pericardial effusion is present. Mitral Valve: The mitral valve is grossly normal. No evidence of mitral valve regurgitation. Tricuspid Valve: The tricuspid valve is grossly normal. Tricuspid valve regurgitation is not demonstrated. Aortic Valve: The aortic valve has an indeterminant number of cusps. Aortic valve regurgitation is not visualized. Pulmonic Valve: The pulmonic valve was grossly normal. Pulmonic valve regurgitation is not visualized. Aorta: The aortic root and ascending aorta are structurally normal, with no evidence of dilitation. Venous: The inferior vena cava is  normal in size with less than 50% respiratory variability, suggesting right atrial pressure of 8 mmHg. IAS/Shunts: No atrial level shunt detected by color flow Doppler. Additional Comments: A Temporary pacemaker lead is visualized in the right ventricle.  LEFT VENTRICLE PLAX 2D LVOT diam:     1.80 cm   Diastology LV SV:         49        LV e' medial:    10.00 cm/s LV SV Index:   27        LV E/e' medial:  10.0 LVOT Area:     2.54 cm  LV e' lateral:   12.40 cm/s                          LV E/e' lateral: 8.1  RIGHT VENTRICLE RV S prime:     12.40 cm/s TAPSE (M-mode): 1.6 cm LEFT ATRIUM             Index        RIGHT ATRIUM           Index LA Vol (A2C):   48.1 ml 26.47 ml/m  RA Area:     15.90 cm LA Vol (A4C):   42.6 ml 23.44 ml/m  RA Volume:   39.60 ml  21.79 ml/m LA Biplane Vol: 46.6 ml 25.64 ml/m  AORTIC VALVE LVOT Vmax:   90.20 cm/s LVOT Vmean:  69.300 cm/s LVOT VTI:    0.194 m  AORTA Ao Root diam: 3.00 cm Ao Asc diam:  3.40 cm  MITRAL VALVE MV Area (PHT): 2.99 cm     SHUNTS MV Decel Time: 254 msec     Systemic VTI:  0.19 m MV E velocity: 100.00 cm/s  Systemic Diam: 1.80 cm MV A velocity: 72.80 cm/s MV E/A ratio:  1.37 Aditya Sabharwal Electronically signed by Ria Commander Signature Date/Time: 07/06/2023/1:09:01 PM    Final (Updated)    DG CHEST PORT 1 VIEW Result Date: 07/04/2023 CLINICAL DATA:  Status post PICC placement EXAM: PORTABLE CHEST 1 VIEW COMPARISON:  Chest radiograph dated 07/04/2023 at 11:34 a.m. FINDINGS: Lines/tubes: Left IJ PA catheter tip projects over the main pulmonary artery. Interval repositioning of the right upper extremity PICC. Tip now projects over the superior cavoatrial junction, allowing for low lung volumes. Lungs: Asymmetric elevation of the right hemidiaphragm with asymmetrically lower right lung volumes. Right mid and left lower lung linear opacities. Pleura: Unchanged blunting of the right costophrenic angle. No pneumothorax. Heart/mediastinum: Right heart border  remains obscured. Bones: No acute osseous abnormality. IMPRESSION: 1. Interval repositioning of the right upper extremity PICC. Tip now projects over the superior cavoatrial junction, allowing for low lung volumes. 2. Unchanged blunting of the right costophrenic angle, which may represent a small pleural effusion or atelectasis. 3. Right mid and left lower lung linear opacities, likely atelectasis. Electronically Signed   By: Limin  Xu M.D.   On: 07/04/2023 15:02   DG CHEST PORT 1 VIEW Result Date: 07/04/2023 CLINICAL DATA:  PICC line placement EXAM: PORTABLE CHEST 1 VIEW COMPARISON:  07/02/2023 FINDINGS: Removal of the Impella device. Left IJ Swan-Ganz catheter tip in proximal right pulmonary artery. Placement of a right-sided PICC line. This terminates over the mid SVC and is angulated, likely entering the azygous vein. Moderate to marked right hemidiaphragm elevation. Midline trachea. Mild cardiomegaly. No pleural effusion or pneumothorax. Improved mild interstitial edema. Improved bibasilar airspace disease. IMPRESSION: Right-sided PICC line likely enters the azygous vein. If superior caval/atrial junction location is desired, this should be retracted and readvanced 3-4 cm. Improved aeration with decreased interstitial edema and bibasilar airspace disease. No pneumothorax or other acute complication. Electronically Signed   By: Rockey Kilts M.D.   On: 07/04/2023 13:36   US  EKG SITE RITE Result Date: 07/04/2023 If Site Rite image not attached, placement could not be confirmed due to current cardiac rhythm.  DG CHEST PORT 1 VIEW Result Date: 07/02/2023 CLINICAL DATA:  Heart failure. EXAM: PORTABLE CHEST 1 VIEW COMPARISON:  06/30/2023 FINDINGS: The cardio pericardial silhouette is enlarged. Right IJ intra-aortic balloon pump and left IJ pulmonary artery catheter are stable in position. Asymmetric elevation right hemidiaphragm again noted. Persistent basilar atelectasis with probable bilateral effusions.  Telemetry leads overlie the chest. IMPRESSION: Persistent basilar atelectasis with probable bilateral effusions. Stable position of support apparatus. Electronically Signed   By: Camellia Candle M.D.   On: 07/02/2023 07:44   DG CHEST PORT 1 VIEW Result Date: 06/30/2023 CLINICAL DATA:  Right ventricular assist device. EXAM: PORTABLE CHEST 1 VIEW COMPARISON:  06/29/2023 FINDINGS: Low volume film with asymmetric elevation of the right hemidiaphragm, similar to prior. Vascular congestion and bibasilar collapse/consolidation is not substantially changed. Probable small bilateral pleural effusions. Right IJ Impella device overlies the main pulmonary artery, similar to prior with tip projecting over the left main pulmonary artery. Left IJ pulmonary artery catheter tip overlies the proximal right main pulmonary artery. Heart size stable. Telemetry leads overlie the chest. IMPRESSION: 1. No substantial change in vascular congestion and bibasilar collapse/consolidation. 2. Probable small bilateral pleural effusions.  Electronically Signed   By: Camellia Candle M.D.   On: 06/30/2023 07:38   DG Chest Port 1 View Result Date: 06/29/2023 CLINICAL DATA:  Central line placement. EXAM: PORTABLE CHEST 1 VIEW COMPARISON:  06/28/2023 FINDINGS: Similar low lung volumes with asymmetric elevation right hemidiaphragm. The cardio pericardial silhouette is enlarged. There is pulmonary vascular congestion without overt pulmonary edema. Basilar atelectasis with probable small left pleural effusion. Left IJ pulmonary catheter tip is in the main pulmonary artery. Stable position of the Impella device overlying the main pulmonary artery. Left chest tube again noted without evidence for left-sided pneumothorax. IMPRESSION: 1. Left IJ pulmonary catheter tip is in the main pulmonary artery. Stable position of the Impella device with tip overlying the main pulmonary artery. 2. Low lung volumes with basilar atelectasis and probable small left pleural  effusion. Electronically Signed   By: Camellia Candle M.D.   On: 06/29/2023 07:29   DG Chest Port 1 View Result Date: 06/28/2023 CLINICAL DATA:  Central line placement EXAM: PORTABLE CHEST 1 VIEW COMPARISON:  June 28, 2023, 5:16 a.m. FINDINGS: Tip of the Swan-Ganz in the right main proximal pulmonary artery. The tip of the Impella catheter device is in the outflow portion of the main pulmonary artery. Left chest tube. No pneumothorax No congestive changes No significant pleural effusions IMPRESSION: *No acute cardiopulmonary process. *Support devices as above. Electronically Signed   By: Franky Chard M.D.   On: 06/28/2023 09:24   DG CHEST PORT 1 VIEW Result Date: 06/28/2023 CLINICAL DATA:  [id number].  Right ventricular assist device present. EXAM: PORTABLE CHEST 1 VIEW COMPARISON:  Portable chest yesterday at 4:33 p.m. FINDINGS: 5:16 a.m. Left IJ approach catheter again terminates in the right lung base probably in a lower lobe branch. Right IJ approach Impella device as before terminates in the main pulmonary artery. The cardiomediastinal silhouette and vascular pattern are normal. There is calcification of the transverse aorta. There is increased linear atelectasis in the left base. The lungs are otherwise clear. There is no substantial pleural effusion. Overall aeration is otherwise unchanged. IMPRESSION: 1. Increased linear atelectasis in the left base. No other interval change. 2. Left IJ approach catheter again terminates in the right lung base probably in a lower lobe branch. 3. Right IJ approach Impella device as before terminates in the main pulmonary artery. Electronically Signed   By: Francis Quam M.D.   On: 06/28/2023 05:56   CARDIAC CATHETERIZATION Result Date: 06/27/2023   The left ventricular ejection fraction is greater than 65% by visual estimate. Findings: On NE 15 Ao = 123/68(93) LV = 97/19 RA = 18 RV = 35/17 PA =  35/21 (26) PCW = 19 Fick cardiac output/index = 2.3/1.3 Thermodilution  CO/CI = 2.9/1.6 PVR = 0.8 WU Ao sat = 96% PA sat = 37%, 41% PAPi = 0.78 Assessment: 1. Left dominant coronary system with normal LM, LAD and LCx. Small non-dominant RCA not visualized (previously seen in 2017) 2. LVEF 65% 3. Evidence of severe RV failure with profound shock requiring placement of Impella RP support device [name], MD 11:44 PM  US  Abdomen Limited RUQ (LIVER/GB) Result Date: 06/27/2023 CLINICAL DATA:  Prominent gallbladder on recent CT examination EXAM: ULTRASOUND ABDOMEN LIMITED RIGHT UPPER QUADRANT COMPARISON:  CT from earlier in the same day. FINDINGS: Gallbladder: No gallstones or wall thickening visualized. No sonographic Murphy sign noted by sonographer. Common bile duct: Diameter: 2.7 mm Liver: Mildly increased in echogenicity consistent with fatty infiltration. Portal vein is patent on color  Doppler imaging with normal direction of blood flow towards the liver. Other: None. IMPRESSION: Fatty liver. No acute abnormality in the gallbladder. Electronically Signed   By: Oneil Devonshire M.D.   On: 06/27/2023 21:45   ECHOCARDIOGRAM LIMITED Result Date: 06/27/2023    ECHOCARDIOGRAM LIMITED REPORT   Patient Name:   BRYNJA MARKER Date of Exam: 06/27/2023 Medical Rec #:  [id number]       Height:       63.0 in Accession #:    [id number]      Weight:       160.0 lb Date of Birth:  1950-10-17       BSA:          1.759 m Patient Age:    73 years        BP:           142/102 mmHg Patient Gender: F               HR:           72 bpm. Exam Location:  Inpatient Procedure: Limited Echo (Both Spectral and Color Flow Doppler were utilized            during procedure). Indications:    I50.9* Heart failure (unspecified)  History:        Patient has prior history of Echocardiogram examinations, most                 recent 06/27/2023. CHF; CAD.  Sonographer:    Meagan Baucom RDCS, FE, PE Sonographer#2:  Ellouise Mose RDCS Referring Phys: [id number] ADITYA GARDENIA  Sonographer Comments: Technically difficult  study due to poor echo windows. Image acquisition challenging due to breast implants. Mclean present. Impella device in PA. IMPRESSIONS  1. Left ventricular ejection fraction, by estimation, is 60 to 65%. The left ventricle has normal function. The left ventricle has no regional wall motion abnormalities.  2. RV strain attempted- unable to analyze form subcostal     RV Impella device noted.     Inlet port visualized at IVC/RA junction     Cannula traverses tricuspid valve along the RV free wall, no entanglement     Outlet port visualized in main PA     Tricuspid regurgitation not assessed. Right ventricular systolic function is moderately reduced.  3. The inferior vena cava is normal in size with <50% respiratory variability, suggesting right atrial pressure of 8 mmHg. FINDINGS  Left Ventricle: Left ventricular ejection fraction, by estimation, is 60 to 65%. The left ventricle has normal function. The left ventricle has no regional wall motion abnormalities. Right Ventricle: RV strain attempted- unable to analyze form subcostal RV Impella device noted. Inlet port visualized at IVC/RA junction Cannula traverses tricuspid valve along the RV free wall, no entanglement Outlet port visualized in main PA Tricuspid regurgitation not assessed. Right ventricular systolic function is moderately reduced. Pulmonary Artery: The pulmonary artery is of normal size. Venous: The inferior vena cava is normal in size with less than 50% respiratory variability, suggesting right atrial pressure of 8 mmHg. Stanly Leavens MD Electronically signed by Stanly Leavens MD Signature Date/Time: 06/27/2023/5:51:13 PM    Final    DG CHEST PORT 1 VIEW Result Date: 06/27/2023 CLINICAL DATA:  [id number] Central venous catheter in place, temporary [id number] EXAM: PORTABLE CHEST - 1 VIEW COMPARISON:  June 27, 2023, 10:34 a.m. FINDINGS: Elevation of the right hemidiaphragm. No focal airspace consolidation, pleural effusion, or pneumothorax. No  cardiomegaly. Right IJ approach Impella device, terminating  in the region of the main pulmonary artery. Left IJ approach introducer sheath with a pulmonary artery catheter, which terminates in the right lung base. IMPRESSION: 1. Right IJ approach Impella device terminating in the region the main pulmonary artery. 2. Left IJ approach pulmonary artery catheter terminating in the right lung base. 3. No pneumonia or pulmonary edema. Electronically Signed   By: Rogelia Myers M.D.   On: 06/27/2023 16:51   ECHOCARDIOGRAM COMPLETE Result Date: 06/27/2023    ECHOCARDIOGRAM REPORT   Patient Name:   ROZA CREAMER Date of Exam: 06/27/2023 Medical Rec #:  [id number]       Height:       63.0 in Accession #:    [id number]      Weight:       160.0 lb Date of Birth:  18-Dec-1950       BSA:          1.759 m Patient Age:    73 years        BP:           69/45 mmHg Patient Gender: F               HR:           45 bpm. Exam Location:  Inpatient Procedure: 2D Echo, Cardiac Doppler and Color Doppler (Both Spectral and Color            Flow Doppler were utilized during procedure). Indications:    Shock  History:        Patient has no prior history of Echocardiogram examinations.  Sonographer:    Jayson Gaskins Referring Phys: [id number] BRITTAINY M SIMMONS IMPRESSIONS  1. Left ventricular ejection fraction, by estimation, is 60 to 65%. The left ventricle has normal function. Left ventricular endocardial border not optimally defined to evaluate regional wall motion. Left ventricular diastolic function could not be evaluated.  2. 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 Right ventricular systolic function is mildly reduced. The right ventricular size is normal.  3. The mitral valve is grossly normal. No evidence of mitral valve regurgitation.  4. The aortic valve was not well visualized. Aortic valve regurgitation is not visualized.  5. The inferior vena cava is dilated in size with <50% respiratory  variability, suggesting right atrial pressure of 15 mmHg.  6. This is a limited study. This study does not evaluated all parameters for restrictive cardiomyopathy. Consider repeat study in the future if clinically indicated; at that time TDI would be usefuly. Comparison(s): No prior Echocardiogram. FINDINGS  Left Ventricle: Left ventricular ejection fraction, by estimation, is 60 to 65%. The left ventricle has normal function. Left ventricular endocardial border not optimally defined to evaluate regional wall motion. The left ventricular internal cavity size was normal in size. Suboptimal image quality limits for assessment of left ventricular hypertrophy. Left ventricular diastolic function could not be evaluated. 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 The right ventricular size is normal. No increase in right ventricular wall thickness. Right ventricular systolic function is mildly reduced. Left Atrium: Left atrial size was not well visualized. Right Atrium: Right atrial size was not well visualized. Pericardium: Pericardium appears thick but is not well visualized. This is best seen in clip 28. There is no evidence of pericardial effusion. The pericardial effusion appears to contain mixed echogenic material. Thickening/calcification of pericardium present. Mitral Valve: The mitral valve is grossly normal. No evidence of mitral  valve regurgitation. Tricuspid Valve: The tricuspid valve is not well visualized. Tricuspid valve regurgitation is not demonstrated. Aortic Valve: The aortic valve was not well visualized. Aortic valve regurgitation is not visualized. Pulmonic Valve: The pulmonic valve was not well visualized. Pulmonic valve regurgitation is not visualized. Aorta: The ascending aorta was not well visualized and the aortic arch was not well visualized. Venous: The inferior vena cava is dilated in size with less than 50% respiratory variability,  suggesting right atrial pressure of 15 mmHg. IAS/Shunts: The interatrial septum was not well visualized. Additional Comments: A venous catheter is visualized in the right atrium. Stanly Leavens MD Electronically signed by Stanly Leavens MD Signature Date/Time: 06/27/2023/2:20:32 PM    Final    CT Angio Chest/Abd/Pel for Dissection W and/or W/WO Result Date: 06/27/2023 CLINICAL DATA:  Acute aortic syndrome EXAM: CT ANGIOGRAPHY CHEST, ABDOMEN AND PELVIS TECHNIQUE: Non-contrast CT of the chest was initially obtained. Multidetector CT imaging through the chest, abdomen and pelvis was performed using the standard protocol during bolus administration of intravenous contrast. Multiplanar reconstructed images and MIPs were obtained and reviewed to evaluate the vascular anatomy. RADIATION DOSE REDUCTION: This exam was performed according to the departmental dose-optimization program which includes automated exposure control, adjustment of the mA and/or kV according to patient size and/or use of iterative reconstruction technique. CONTRAST:  OMNIPAQUE  IOHEXOL  350 MG/ML SOLN COMPARISON:  CT abdomen and pelvis January 06, 2016 FINDINGS: CTA CHEST FINDINGS Cardiovascular: Satisfactory opacification of the pulmonary arteries to the segmental level. No evidence of pulmonary embolism. Normal heart size. No pericardial effusion. Subtle calcifications of the aortic wall without coronary artery calcifications. Mediastinum/Nodes: No enlarged mediastinal, hilar, or axillary lymph nodes. Thyroid  gland, trachea, and esophagus demonstrate no significant findings. Lungs/Pleura: Subtle ground-glass appearance of both lungs with fibro atelectatic changes of the right upper lobe may correlate with chronic interstitial lung disease without evidence of acute infiltrates consolidations or pulmonary edema. No pulmonary nodules. No pneumonia or pleural effusions Musculoskeletal: No chest wall abnormality. No acute or  significant osseous findings. Review of the MIP images confirms the above findings. CTA ABDOMEN AND PELVIS FINDINGS VASCULAR Aorta: Normal caliber aorta without aneurysm, dissection, vasculitis or significant stenosis. Irregular atheromatous plaque along the lateral wall of the aorta from the 1 to 5 o'clock distribution within the mid and distal descending thoracic aorta with a small ulcerated plaque on image number 66 these findings could correlate with a developing intramural hematoma measuring about 8 mm in maximum thickness by 14 mm in maximum AP diameter as measured on image number 70. Celiac: Patent without evidence of aneurysm, dissection, vasculitis or significant stenosis. SMA: Patent without evidence of aneurysm, dissection, vasculitis or significant stenosis. Renals: Both renal arteries are patent without evidence of aneurysm, dissection, vasculitis, fibromuscular dysplasia or significant stenosis. IMA: Patent without evidence of aneurysm, dissection, vasculitis or significant stenosis. Inflow: Patent without evidence of aneurysm, dissection, vasculitis or significant stenosis. Veins: No obvious venous abnormality within the limitations of this arterial phase study. Review of the MIP images confirms the above findings. NON-VASCULAR Hepatobiliary: Liver normal. Stable 10 mm hepatic cyst anterolateral margin of the right lobe of the liver compared with prior examination. Gallbladder appears slightly distended mildly prominent with poor definition of the fundus. Consider ultrasound evaluation to rule out associated acute cholecystitis. Pancreas: Unremarkable. No pancreatic ductal dilatation or surrounding inflammatory changes. Spleen: Normal in size without focal abnormality. Adrenals/Urinary Tract: Adrenal glands are unremarkable. Kidneys are normal, without renal calculi, focal lesion, or hydronephrosis. Bladder is  unremarkable. Stomach/Bowel: Stomach is within normal limits. Appendix appears normal. No  evidence of bowel wall thickening, distention, or inflammatory changes. Lymphatic: Unremarkable Reproductive: Uterus and bilateral adnexa are unremarkable. Other: No abdominal wall hernia or abnormality. No abdominopelvic ascites. Musculoskeletal: No fracture is seen. Post L4-5 transpedicular fixation and disc arthroplasty Review of the MIP images confirms the above findings. IMPRESSION: *No evidence of pulmonary embolism. *Irregular atheromatous plaque along the lateral wall of the mid and distal descending thoracic aorta with a small ulcerated plaque. These findings could correlate with a developing intramural hematoma measuring about 8 mm in maximum thickness by 14 mm in maximum AP diameter as measured on image number 70. (Intramural hematoma Stanford type B) *Subtle ground-glass appearance of both lungs with fibro atelectatic changes of the right upper lobe may correlate with chronic interstitial lung disease without evidence of acute infiltrates consolidations or pulmonary edema. *Gallbladder appears slightly distended mildly prominent with poor definition of the fundus. Consider ultrasound evaluation to exclude associated acute cholecystitis. Critical Value/emergent results were called by telephone at the time of interpretation on 06/27/2023 at 11:09 am to provider [facility] , who verbally acknowledged these results. Electronically Signed   By: Franky Chard M.D.   On: 06/27/2023 11:10   DG Chest Portable 1 View Result Date: 06/27/2023 CLINICAL DATA:  Shortness of breath and midsternal chest pain radiating to the back and jaw for the past few days. Recently diagnosed with atrial fibrillation. EXAM: PORTABLE CHEST 1 VIEW COMPARISON:  12/31/2018 FINDINGS: The heart size and mediastinal contours are within normal limits. Both lungs are clear. The visualized skeletal structures are unremarkable. IMPRESSION: No active disease. Electronically Signed   By: Elspeth Bathe M.D.   On: 06/27/2023 10:42      Corean Fireman, MSN, NP-C [facility] Pager: [phone]  07/06/2023 12:53 PM    Total Encounter Time: 22 minutes

## 2023-07-06 NOTE — Progress Notes (Signed)
 Physical Therapy Treatment Patient Details Name: Vicki [name] MRN: [id number] DOB: [birth year] Today's Date: 07/06/2023   History of Present Illness 73 y/o female presenting to the ED 06/27/23 w/ 3 day h/o severe chest pain. In cardiogenic shock due to RV failure, hypotensive, bradycardia. R and L heart cath; Chest CT ulcerated plaque in descending aorta; Impella 6/10-6/15; 6/18 arrhythmias requiring RIJ TVP  PMH-chronic back pain, MI, HLD, HTN, migraines, TIA, CVAx3, osteopenia, PTSD,    PT Comments  Per discussion with RN, pt with very low underlying rhythm (20s) and feels pt is only safe for bed level exercises. Patient tolerated several exercises and then became nauseated and requested to stop. Will continue to monitor her discharge needs as she is able to resume OOB activity.     If plan is discharge home, recommend the following: A little help with walking and/or transfers;Assistance with cooking/housework;Direct supervision/assist for financial management;Direct supervision/assist for medications management;Help with stairs or ramp for entrance;Supervision due to cognitive status   Can travel by private vehicle        Equipment Recommendations  None recommended by PT    Recommendations for Other Services       Precautions / Restrictions Precautions Precautions: Fall Recall of Precautions/Restrictions: Impaired     Mobility  Bed Mobility                    Transfers                        Ambulation/Gait                   Stairs             Wheelchair Mobility     Tilt Bed    Modified Rankin (Stroke Patients Only)       Balance                                            Communication Communication Communication: No apparent difficulties  Cognition Arousal: Alert Behavior During Therapy: WFL for tasks assessed/performed   PT - Cognitive impairments: No family/caregiver present to determine baseline                        PT - Cognition Comments: knew [facility] and city; time NT Following commands: Impaired Following commands impaired: Follows one step commands with increased time    Cueing Cueing Techniques: Verbal cues  Exercises General Exercises - Lower Extremity Ankle Circles/Pumps: AROM, Both, 10 reps Quad Sets: AROM, Both, 10 reps Heel Slides: AROM, Left, 5 reps (resisted extension; became nauseated and stopped) Hip ABduction/ADduction: AROM, Both, 10 reps, Supine    General Comments        Pertinent Vitals/Pain Pain Assessment Pain Assessment: No/denies pain    Home Living                          Prior Function            PT Goals (current goals can now be found in the care plan section) Acute Rehab PT Goals Patient Stated Goal: none stated Time For Goal Achievement: 07/17/23 Potential to Achieve Goals: Good Progress towards PT goals: Not progressing toward goals - comment    Frequency    Min 2X/week  PT Plan      Co-evaluation              AM-PAC PT 6 Clicks Mobility   Outcome Measure  Help needed turning from your back to your side while in a flat bed without using bedrails?: A Little Help needed moving from lying on your back to sitting on the side of a flat bed without using bedrails?: Total Help needed moving to and from a bed to a chair (including a wheelchair)?: Total Help needed standing up from a chair using your arms (e.g., wheelchair or bedside chair)?: Total Help needed to walk in hospital room?: Total Help needed climbing 3-5 steps with a railing? : Total 6 Click Score: 8    End of Session   Activity Tolerance: Treatment limited secondary to medical complications (Comment) (nausea) Patient left: in bed;with call bell/phone within reach   PT Visit Diagnosis: Difficulty in walking, not elsewhere classified (R26.2)     Time: 1610-9604 PT Time Calculation (min) (ACUTE ONLY): 9 min  Charges:     $Therapeutic Exercise: 8-22 mins PT General Charges $$ ACUTE PT VISIT: 1 Visit                      Gayle Kava, PT Acute Rehabilitation Services  Office [phone]    [place] Leep 07/06/2023, 2:57 PM

## 2023-07-07 ENCOUNTER — Encounter (HOSPITAL_COMMUNITY): Payer: Self-pay | Admitting: Cardiology

## 2023-07-07 DIAGNOSIS — R570 Cardiogenic shock: Secondary | ICD-10-CM | POA: Diagnosis not present

## 2023-07-07 DIAGNOSIS — R7881 Bacteremia: Secondary | ICD-10-CM

## 2023-07-07 DIAGNOSIS — I50810 Right heart failure, unspecified: Secondary | ICD-10-CM

## 2023-07-07 DIAGNOSIS — B957 Other staphylococcus as the cause of diseases classified elsewhere: Secondary | ICD-10-CM

## 2023-07-07 LAB — CBC
HCT: 27.5 % — ABNORMAL LOW (ref 36.0–46.0)
Hemoglobin: 9.4 g/dL — ABNORMAL LOW (ref 12.0–15.0)
MCH: 31.8 pg (ref 26.0–34.0)
MCHC: 34.2 g/dL (ref 30.0–36.0)
MCV: 92.9 fL (ref 80.0–100.0)
Platelets: 179 10*3/uL (ref 150–400)
RBC: 2.96 MIL/uL — ABNORMAL LOW (ref 3.87–5.11)
RDW: 13.2 % (ref 11.5–15.5)
WBC: 10.7 10*3/uL — ABNORMAL HIGH (ref 4.0–10.5)
nRBC: 0 % (ref 0.0–0.2)

## 2023-07-07 LAB — CULTURE, BLOOD (ROUTINE X 2)
Special Requests: ADEQUATE
Special Requests: ADEQUATE

## 2023-07-07 LAB — MAGNESIUM: Magnesium: 1.8 mg/dL (ref 1.7–2.4)

## 2023-07-07 LAB — COOXEMETRY PANEL
Carboxyhemoglobin: 1.9 % — ABNORMAL HIGH (ref 0.5–1.5)
Carboxyhemoglobin: 2 % — ABNORMAL HIGH (ref 0.5–1.5)
Methemoglobin: <0.7 % (ref 0.0–1.5)
Methemoglobin: <0.7 % (ref 0.0–1.5)
O2 Saturation: 60.6 %
O2 Saturation: 62.5 %
Total hemoglobin: 9.8 g/dL — ABNORMAL LOW (ref 12.0–16.0)
Total hemoglobin: 9.7 g/dL — ABNORMAL LOW (ref 12.0–16.0)

## 2023-07-07 LAB — BASIC METABOLIC PANEL WITH GFR
Anion gap: 11 (ref 5–15)
BUN: 15 mg/dL (ref 8–23)
CO2: 29 mmol/L (ref 22–32)
Calcium: 8.4 mg/dL — ABNORMAL LOW (ref 8.9–10.3)
Chloride: 85 mmol/L — ABNORMAL LOW (ref 98–111)
Creatinine, Ser: 0.7 mg/dL (ref 0.44–1.00)
GFR, Estimated: >60 mL/min (ref >60)
Glucose, Bld: 118 mg/dL — ABNORMAL HIGH (ref 70–99)
Potassium: 3.6 mmol/L (ref 3.5–5.1)
Sodium: 125 mmol/L — ABNORMAL LOW (ref 135–145)

## 2023-07-07 LAB — HEPARIN LEVEL (UNFRACTIONATED): Heparin Unfractionated: 0.49 [IU]/mL (ref 0.30–0.70)

## 2023-07-07 MED ORDER — MAGNESIUM SULFATE 2 GM/50ML IV SOLN
2.0000 g | Freq: Once | INTRAVENOUS | Status: AC
  Administered 2023-07-07: 2 g via INTRAVENOUS
  Filled 2023-07-07: qty 50

## 2023-07-07 MED ORDER — POTASSIUM CHLORIDE CRYS ER 20 MEQ PO TBCR
60.0000 meq | EXTENDED_RELEASE_TABLET | Freq: Once | ORAL | Status: AC
  Administered 2023-07-07: 60 meq via ORAL
  Filled 2023-07-07: qty 3

## 2023-07-07 MED ORDER — ENSURE PLUS HIGH PROTEIN PO LIQD
237.0000 mL | Freq: Two times a day (BID) | ORAL | Status: DC
  Administered 2023-07-07 – 2023-07-09 (×4): 237 mL via ORAL

## 2023-07-07 NOTE — Progress Notes (Signed)
 Occupational Therapy Treatment Patient Details Name: Vicki [name] MRN: [id number] DOB: 16-May-1950 Today's Date: 07/07/2023   History of present illness 73 y/o female presenting to the ED 06/27/23 w/ 3 day h/o severe chest pain. In cardiogenic shock due to RV failure, hypotensive, bradycardia. R and L heart cath; Chest CT ulcerated plaque in descending aorta; Impella 6/10-6/15; 6/18 arrhythmias requiring RIJ TVP  PMH-chronic back pain, MI, HLD, HTN, migraines, TIA, CVAx3, osteopenia, PTSD,   OT comments  Discussed with nsg prior to session. Attempted Egress position however pt reports feeling like she is choking when sitting upright. Elevated HOB as tolerated followed by bed level exercise. Emphasized importance of completing functional movement patterns, including figure four position and bridging. Required rest break after 2 reps. Cognition improved from last session. Pt very motivated to get better and return to being independent. Current recommendation is HHOT however pt may need post acute rehab pending progress. Acute OT to follow.       If plan is discharge home, recommend the following:  A little help with walking and/or transfers;A lot of help with bathing/dressing/bathroom;Assistance with cooking/housework;Direct supervision/assist for medications management;Direct supervision/assist for financial management;Assist for transportation;Help with stairs or ramp for entrance   Equipment Recommendations  BSC/3in1    Recommendations for Other Services      Precautions / Restrictions Precautions Precautions: Fall Precaution/Restrictions Comments: TVP; R IJ       Mobility Bed Mobility Overal bed mobility: Needs Assistance Bed Mobility: Rolling Rolling: Min assist              Transfers                   General transfer comment: moved into partial upright sitting in EGress - pt complains of struggling to breath when sitting upright     Balance                                            ADL either performed or assessed with clinical judgement   ADL Overall ADL's : Needs assistance/impaired     Grooming: Set up;Sitting Grooming Details (indicate cue type and reason): ha djust finished combing her hair     Lower Body Bathing: Moderate assistance Lower Body Bathing Details (indicate cue type and reason): using figure four @ bed level     Lower Body Dressing: Maximal assistance;Bed level       Toileting- Clothing Manipulation and Hygiene: Maximal assistance Toileting - Clothing Manipulation Details (indicate cue type and reason): Pt able to hlep roll for placement of bedpan            Extremity/Trunk Assessment Upper Extremity Assessment Upper Extremity Assessment: Generalized weakness   Lower Extremity Assessment Lower Extremity Assessment: Defer to PT evaluation        Vision       Perception     Praxis     Communication     Cognition Arousal: Alert Behavior During Therapy: [facility] for tasks assessed/performed Cognition: No apparent impairments             OT - Cognition Comments: cognition significantly improved; aware of her medical situation and discussing her  earlier conversation with her MD regarding the need for a pacemaker                 Following commands: Intact  Cueing      Exercises Exercises: General Upper Extremity, Other exercises General Exercises - Upper Extremity Shoulder Flexion: AROM, Both, 15 reps, Seated (seated @ 60 degrees; FF to 90) Elbow Flexion: AROM, Both, 10 reps, Strengthening, Supine Elbow Extension: AROM, Both, 10 reps, Seated, Supine Other Exercises Other Exercises: bridging x 5 Other Exercises: figure four in reclined position x 5 each leg    Shoulder Instructions       General Comments      Pertinent Vitals/ Pain       Pain Assessment Pain Assessment: No/denies pain  Home Living                                           Prior Functioning/Environment              Frequency  Min 2X/week        Progress Toward Goals  OT Goals(current goals can now be found in the care plan section)  Progress towards OT goals: Progressing toward goals  Acute Rehab OT Goals OT Goal Formulation: With patient Time For Goal Achievement: 07/17/23 Potential to Achieve Goals: Good ADL Goals Pt Will Perform Grooming: with supervision;standing Pt Will Perform Upper Body Dressing: with set-up;sitting Pt Will Perform Lower Body Dressing: with supervision;sit to/from stand Pt Will Transfer to Toilet: with supervision;ambulating;regular height toilet Pt Will Perform Toileting - Clothing Manipulation and hygiene: with supervision;sit to/from stand Additional ADL Goal #1: Pt will participate in formal cognitive assessment.  Plan      Co-evaluation                 AM-PAC OT 6 Clicks Daily Activity     Outcome Measure   Help from another person eating meals?: None Help from another person taking care of personal grooming?: A Little Help from another person toileting, which includes using toliet, bedpan, or urinal?: A Lot Help from another person bathing (including washing, rinsing, drying)?: A Lot Help from another person to put on and taking off regular upper body clothing?: A Little Help from another person to put on and taking off regular lower body clothing?: A Lot 6 Click Score: 16    End of Session Equipment Utilized During Treatment: Oxygen (2L)  OT Visit Diagnosis: Unsteadiness on feet (R26.81);Muscle weakness (generalized) (M62.81);Other symptoms and signs involving cognitive function   Activity Tolerance Patient limited by fatigue   Patient Left in bed;with call bell/phone within reach   Nurse Communication Mobility status;Other (comment) (tolerance of session)        Time: 8657-8469 OT Time Calculation (min): 22 min  Charges: OT General Charges $OT Visit: 1 Visit OT  Treatments $Therapeutic Activity: 8-22 mins  Milburn Aliment, OT/L   Acute OT Clinical Specialist Acute Rehabilitation Services Pager [phone] Office [phone]   [facility] 07/07/2023, 4:03 PM

## 2023-07-07 NOTE — Progress Notes (Signed)
   Consulted EP for Pacer at the request of Dr Alease Amend. Discussed with Michaelle Adolphus PA.   2nd set of blood cultures  obtained 07/06/23 Echo no evidence of endocarditis.    Catharine Kettlewell NP-C  11:48 AM

## 2023-07-07 NOTE — Progress Notes (Signed)
 ANTICOAGULATION CONSULT NOTE  Pharmacy Consult for heparin >> bivalirudin >>heparin  Indication: Impella RP >Afib  Allergies  Allergen Reactions   Codeine Other (See Comments)    headache   Crestor [Rosuvastatin] Other (See Comments)    Muscle aches   Other Nausea And Vomiting    Anesthesia-severe vomiting   Simvastatin Other (See Comments)    Muscle aches   Egg White (Egg Protein)     Other Reaction(s): Throat closes   Ezetimibe      Other Reaction(s): crippling muscle aches   Hydrocodone-Acetaminophen      Other Reaction(s): migraines   Peanut (Diagnostic)     Other Reaction(s): itchy   Shellfish Allergy Other (See Comments)    Throat swelling   Statins     Patient Measurements: Height: 5' 3 (160 cm) Weight: 78.4 kg (172 lb 13.5 oz) IBW/kg (Calculated) : 52.4 Heparin  Dosing Weight: 68 kg   Vital Signs: Temp: 97.9 F (36.6 C) (06/20 0800) Temp Source: Oral (06/20 0800) BP: 98/54 (06/20 0700) Pulse Rate: 66 (06/20 0700)  Labs: Recent Labs    07/05/23 0427 07/05/23 1200 07/05/23 2245 07/06/23 0430 07/06/23 1615 07/07/23 0500  HGB 10.3*  --   --  9.3*  --  9.4*  HCT 30.1*  --   --  26.8*  --  27.5*  PLT 142*  --   --  149*  --  179  HEPARINUNFRC 0.38   < > 0.30 0.34  --  0.49  CREATININE 0.96   < >  --  0.82 0.79 0.70   < > = values in this interval not displayed.    Estimated Creatinine Clearance: 63 mL/min (by C-G formula based on SCr of 0.7 mg/dL).   Assessment: 73 yo female in cardiogenic shock with RV failure s/p Impella RP 6/10.  Not on anticoagulation prior to admission.  Pharmacy consulted for heparin  dosing.  Chest CT w/ Type B dissection (small ulcerated plaque/developing intramural hematoma ~8 mm x 14 mm).   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  Discussed with team since HIT negative will change to heparin  infusion 6/17.   Heparin  level 0.49 at goal on 1250 units/hr.  Hgb 9.4, pltc 179 stable. No s/sx of bleeding or infusion issues.   Goal of  Therapy:  Heparin  level 0.3-0.7 units/ml Monitor platelets by anticoagulation protocol: Yes   Plan:  Continue heparin  IV 1250 units/hr  Monitor daily HL, CBC, and for s/sx of bleeding   Thank you for allowing pharmacy to participate in this patient's care,  Cecillia Cogan, PharmD Clinical Pharmacist 07/07/2023  8:51 AM

## 2023-07-07 NOTE — Progress Notes (Signed)
 Advanced Heart Failure Rounding Note  HF Cardiologist: Carollynn Cirri, MD Chief Complaint: Cardiogenic Shock Subjective:   DBA 2.5 mcg , CO-OX 61%.   Confused over night. Orients to self.     Objective:    Vital Signs:   Temp:  [97.5 F (36.4 C)-97.9 F (36.6 C)] 97.8 F (36.6 C) (06/20 0322) Pulse Rate:  [64-79] 70 (06/20 0600) Resp:  [11-24] 11 (06/20 0600) BP: (75-138)/(55-94) 136/77 (06/20 0600) SpO2:  [87 %-100 %] 92 % (06/20 0600) Weight:  [78.4 kg] 78.4 kg (06/20 0447) Last BM Date : 07/05/23  Weight change: Filed Weights   07/05/23 0437 07/06/23 0500 07/07/23 0447  Weight: 80.5 kg 78.4 kg 78.4 kg   Intake/Output:  Intake/Output Summary (Last 24 hours) at 07/07/2023 1610 Last data filed at 07/07/2023 0600 Gross per 24 hour  Intake 841.98 ml  Output 2300 ml  Net -1458.02 ml  CVP 5-6   General:   No resp difficulty + TVP  Neck: supple. no JVD.  Cor: PMI nondisplaced. Regular rate & rhythm. No rubs, gallops or murmurs. Lungs: clear Abdomen: soft, nontender, nondistended.  Extremities: no cyanosis, clubbing, rash, edema, MAE x4  Neuro: alert & oriented to self.    Telemetry:  SR 60s  Labs: Basic Metabolic Panel: Recent Labs  Lab 07/03/23 0413 07/04/23 0354 07/04/23 1402 07/05/23 0427 07/05/23 0730 07/05/23 1200 07/05/23 1810 07/06/23 0430 07/06/23 1615 07/07/23 0500  NA 126* 123*   < > 120*  --  121* 122* 121* 125* 125*  K 3.9 3.9   < > 6.1*   < > 5.2* 3.9 3.7 3.9 3.6  CL 87* 85*   < > 83*  --  84* 81* 84* 84* 85*  CO2 29 28   < > 25  --  20* 28 27 29 29   GLUCOSE 109* 113*   < > 210*  --  124* 210* 123* 94 118*  BUN 7* 7*   < > 15  --  19 17 20 18 15   CREATININE 0.68 0.61   < > 0.96  --  0.95 0.86 0.82 0.79 0.70  CALCIUM 8.5* 8.3*   < > 8.1*  --  14.5* 8.8* 8.1* 8.3* 8.4*  MG 2.0 2.0  --  2.3  --   --   --  2.0  --  1.8   < > = values in this interval not displayed.   Liver Function Tests: Recent Labs  Lab 07/01/23 0507 07/02/23 0434  07/04/23 0500  AST 33 23 27  ALT 62* 45* 35  ALKPHOS 54 55 74  BILITOT 1.2 1.2 0.8  PROT 5.1* 5.1* 5.4*  ALBUMIN 2.5* 2.4* 2.4*   CBC: Recent Labs  Lab 07/03/23 0413 07/04/23 0354 07/05/23 0427 07/06/23 0430 07/07/23 0500  WBC 8.8 7.6 15.4* 12.6* 10.7*  HGB 9.4* 9.5* 10.3* 9.3* 9.4*  HCT 27.4* 27.6* 30.1* 26.8* 27.5*  MCV 94.8 93.6 92.9 91.8 92.9  PLT 91* 100* 142* 149* 179   BNP (last 3 results) Recent Labs    06/27/23 1020  BNP 543.0*   Medications:    Scheduled Medications:  aspirin  EC  81 mg Oral Daily   Chlorhexidine  Gluconate Cloth  6 each Topical Daily   feeding supplement  237 mL Oral BID BM   levothyroxine   50 mcg Oral Daily   lidocaine   1 patch Transdermal Q24H   multivitamin with minerals  1 tablet Oral Daily   pantoprazole  40 mg Oral Daily  polyethylene glycol  17 g Oral BID   senna  1 tablet Oral QHS   sertraline   100 mg Oral Daily   sodium chloride  flush  10-40 mL Intracatheter Q12H   sodium chloride  flush  3 mL Intravenous Q12H   Infusions:   ceFAZolin  (ANCEF ) IV Stopped (07/07/23 0531)   DOBUTamine  2.5 mcg/kg/min (07/07/23 0600)   heparin  1,250 Units/hr (07/07/23 0600)   PRN Medications: sodium chloride , acetaminophen , HYDROmorphone  (DILAUDID ) injection, lip balm, ondansetron  (ZOFRAN ) IV, mouth rinse, oxyCODONE , sodium chloride  flush, sodium chloride  flush  Assessment/Plan:   Cardiogenic Shock, 2/2 Profound RV Failure   - cath 6/10 normal left-dominant system with non-visualized tiny RCA - Echo LVEF 60-65% RV moderate HK with McConnell's sign - RHC 6/10 (on NE 15): RA 18, PA 35/21 (26), PCWP 19, CO/CI (TD) 2.9/1.6, PVR 0.8, PAPi 0.78 - CT w/o PE - s/p Impella RP placement. Removed 6/15. - POCUS ECHO 6/12 w/ improvement LVEF 60% RV mild HK  - 6/`19 Echo No evidence of endocarditis. RV normal. LV 60-65%.  - Coox 61%  Stop DBA. Check CO-OX at 1200.   - CVP low. Does not need diuretics.  -- TVP in place. Maintaining SR.    Acute  diastolic HF - plan as above  NSTEMI with RV infarct - plan as above - continue ASA - no b-blocker with shock  Tachy-brady Syndrome - in/out AF in 120-130s and acclerated junctional in 40s. - overnight night persistently brady junctional rhythm in the 30s-40 with pause up to 5s - prev on bival with concern for HIT, SRA negative - continue heparin  - TVP in place rate turned to 50; - Will eventually need PPM once bacteremia clears and closer to discharge Bld Cx- pending.    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 Na 125 today - restrict FW  Thrombocytopenia - heparin  switched to bival on 6/13. PLTs stabilized/improved off heparin  - SRA negative - continue heparin  gtt - PLT within normal range.   Bacteremia - Lines removed. Bcx 6/17 grew MSSE.  - WBC down 10.7  - On Ancef  - Formal echo to eval heart valves, no evidence of endocarditis.  - ID following; appreciate recs -  6/19 blood cultures- NGTD   Hyperkalemia: resolved Shock Liver: resolved AKI: resolved  Length of Stay: 10   CRITICAL CARE Performed by: Nieves Bars NP-C   Total critical care time: 13 minutes  -Critical care time was exclusive of separately billable procedures and treating other patients. -Critical care was necessary to treat or prevent imminent or life-threatening deterioration. -Critical care was time spent personally by me on the following activities: development of treatment plan with patient and/or surrogate as well as nursing, discussions with consultants, evaluation of patient's response to treatment, examination of patient, obtaining history from patient or surrogate, ordering and performing treatments and interventions, ordering and review of laboratory studies, ordering and review of radiographic studies, pulse  oximetry and re-evaluation of patient's condition.  Nieves Bars, NP 07/07/2023, 6:32 AM  Advanced Heart Failure Team Pager [phone] (M-F; 7a - 5p)

## 2023-07-07 NOTE — Consult Note (Addendum)
 ELECTROPHYSIOLOGY CONSULT NOTE    Patient ID: LUPITA ROSALES MRN: [id number], DOB/AGE: August 31, 1950 73 y.o.  Admit date: 06/27/2023 Date of Consult: 07/07/2023  Primary Physician: Faustina Hood, MD Primary Cardiologist: None  Electrophysiologist: New   Referring Provider: Dr. Alease Amend  Patient Profile: SHARENA DIBENEDETTO is a 73 y.o. female with a history of HLD, HTN, and former tobacco abuse who is being seen today for the evaluation of bradycardia at the request of Dr. Alease Amend.  HPI:  MAGNOLIA MATTILA is a 73 y.o. female who presented from home with malaise, weakness, tearing chest pains and found to have acute NSTEMI, and RV failure.  Of note, pt was having symptoms of N/V, lightheadedness,weakness, and diaphoresis at [facility] on 6/8; Noted to be in new AF, but refused transfer to emergency room.  She continued to have sworsening SOB and chest pain.  Pt with very complicated admission thus far.   Emergent R/LHC with no significant CAD or culprit lesion but unable to angiographically identify RCA (seen as a small RCA in cath 2017) and severe RV failure noted. Impella placed.   CTA without PE but with distal descending thoracic aorta with a small ulcerated plaque measuring about 8mm in maximum thickness by 14 mm in maximum diameter.   Impella removed 6/15. Continued to require pressor support.  Shock liver improved, AKI improved.   On 6/17 had fever > 102 with leukocytosis. BCx x4 + with Staph Epi. ID following.  TTE without signs of endocarditis. Treated with ABx.   With continued intermittent bradycardia despite pressors and dobutamine , Temp wire placed 6/18 in RIJ, and with continued need EP consulted.  Repeat BCx 6/19 pending.  Pt awake and alert on my exam. Doesn't remember much of the events thus far. Denies chest pain or SOB currently.  Had confusion overnight per chart. Dobutamine  stopped this am.  Having occasional PVCs and depending on PPM currently when I turn her down to 40.    Potassium3.6 (06/20 0500) Magnesium   1.8 (06/20 0500) Creatinine, ser  0.70 (06/20 0500) PLT  179 (06/20 0500) HGB  9.4* (06/20 0500) WBC 10.7* (06/20 0500)  .    Allergies, Medical, Surgical, Social, and Family Histories have been reviewed and are referenced here-in when relevant for medical decision making.    Physical Exam: Vitals:   07/07/23 0800 07/07/23 0900 07/07/23 1000 07/07/23 1100  BP: 118/71 123/77 114/71 (!) 87/71  Pulse: 68 62 67 (!) 50  Resp: (!) 25 16 17 16   Temp: 97.9 F (36.6 C)     TempSrc: Oral     SpO2: 93% 93% 93% 100%  Weight:      Height:        GEN- NAD, Alert and Oriented to self, person.  HEENT: Normocephalic, atraumatic. RIJ temp wire Lungs- CTAB, Normal effort.  Heart-Irregular rate and rhythm due to ectopy, No M/G/R.  GI- Soft, NT, ND.  Extremities- No clubbing, cyanosis, or edema   Radiology/Studies: CARDIAC CATHETERIZATION Result Date: 07/06/2023 Successful placement of temporary dialysis catheter   DG CHEST PORT 1 VIEW Result Date: 07/06/2023 CLINICAL DATA:  Chest pain, cardiogenic shock, heart failure EXAM: PORTABLE CHEST 1 VIEW COMPARISON:  07/04/2023 FINDINGS: Single frontal view of the chest demonstrates right-sided PICC tip overlying superior vena cava. Right internal jugular catheter tip overlies the main pulmonary outflow tract. Additional radiopaque wire overlying the left chest is likely external to the patient. Cardiac silhouette is stable. Stable elevated right hemidiaphragm. No airspace disease, effusion, or pneumothorax. IMPRESSION:  1. Support devices as above. 2. Elevated right hemidiaphragm.  No acute airspace disease. Electronically Signed   By: Bobbye Burrow M.D.   On: 07/06/2023 14:23   ECHOCARDIOGRAM COMPLETE Result Date: 07/06/2023    ECHOCARDIOGRAM REPORT   Patient Name:   ANNALUCIA LAINO Date of Exam: 07/06/2023 Medical Rec #:  [id number]       Height:       63.0 in Accession #:    [id number]      Weight:       172.8 lb  Date of Birth:  [birth year]       BSA:          1.817 m Patient Age:    73 years        BP:           97/71 mmHg Patient Gender: F               HR:           70 bpm. Exam Location:  Inpatient Procedure: 2D Echo, Color Doppler, Cardiac Doppler and Intracardiac            Opacification Agent (Both Spectral and Color Flow Doppler were            utilized during procedure). Indications:     I50.9* Heart failure (unspecified)  History:         Patient has prior history of Echocardiogram examinations, most                  recent 06/27/2023. Risk Factors:Hypertension, Dyslipidemia and                  ETOH.  Sonographer:     Sherline Distel Senior RDCS Referring Phys:  [place] LEE Diagnosing Phys: Alwin Baars  Sonographer Comments: Technically difficult due to breast implants and respiratory motion IMPRESSIONS  1. Technically difficult study.  2. Left ventricular ejection fraction, by estimation, is 60 to 65%. The left ventricle has normal function. The left ventricle has no regional wall motion abnormalities. Left ventricular diastolic parameters were normal.  3. Right ventricular systolic function is normal. The right ventricular size is mildly enlarged. A Temporary pacemaker lead is visualized. Tricuspid regurgitation signal is inadequate for assessing PA pressure.  4. A small pericardial effusion is present.  5. The mitral valve is grossly normal. No evidence of mitral valve regurgitation.  6. The aortic valve has an indeterminant number of cusps. Aortic valve regurgitation is not visualized.  7. The inferior vena cava is normal in size with <50% respiratory variability, suggesting right atrial pressure of 8 mmHg. FINDINGS  Left Ventricle: Left ventricular ejection fraction, by estimation, is 60 to 65%. The left ventricle has normal function. The left ventricle has no regional wall motion abnormalities. Definity contrast agent was given IV to delineate the left ventricular  endocardial borders. The left ventricular internal  cavity size was normal in size. Suboptimal image quality limits for assessment of left ventricular hypertrophy. Left ventricular diastolic parameters were normal. Right Ventricle: The right ventricular size is mildly enlarged. No increase in right ventricular wall thickness. Right ventricular systolic function is normal. Tricuspid regurgitation signal is inadequate for assessing PA pressure. Left Atrium: Left atrial size was normal in size. Right Atrium: Right atrial size was normal in size. Pericardium: A small pericardial effusion is present. Mitral Valve: The mitral valve is grossly normal. No evidence of mitral valve regurgitation. Tricuspid Valve: The tricuspid valve is grossly normal. Tricuspid valve regurgitation is  not demonstrated. Aortic Valve: The aortic valve has an indeterminant number of cusps. Aortic valve regurgitation is not visualized. Pulmonic Valve: The pulmonic valve was grossly normal. Pulmonic valve regurgitation is not visualized. Aorta: The aortic root and ascending aorta are structurally normal, with no evidence of dilitation. Venous: The inferior vena cava is normal in size with less than 50% respiratory variability, suggesting right atrial pressure of 8 mmHg. IAS/Shunts: No atrial level shunt detected by color flow Doppler. Additional Comments: A Temporary pacemaker lead is visualized in the right ventricle.  LEFT VENTRICLE PLAX 2D LVOT diam:     1.80 cm   Diastology LV SV:         49        LV e' medial:    10.00 cm/s LV SV Index:   27        LV E/e' medial:  10.0 LVOT Area:     2.54 cm  LV e' lateral:   12.40 cm/s                          LV E/e' lateral: 8.1  RIGHT VENTRICLE RV S prime:     12.40 cm/s TAPSE (M-mode): 1.6 cm LEFT ATRIUM             Index        RIGHT ATRIUM           Index LA Vol (A2C):   48.1 ml 26.47 ml/m  RA Area:     15.90 cm LA Vol (A4C):   42.6 ml 23.44 ml/m  RA Volume:   39.60 ml  21.79 ml/m LA Biplane Vol: 46.6 ml 25.64 ml/m  AORTIC VALVE LVOT Vmax:    90.20 cm/s LVOT Vmean:  69.300 cm/s LVOT VTI:    0.194 m  AORTA Ao Root diam: 3.00 cm Ao Asc diam:  3.40 cm MITRAL VALVE MV Area (PHT): 2.99 cm     SHUNTS MV Decel Time: 254 msec     Systemic VTI:  0.19 m MV E velocity: 100.00 cm/s  Systemic Diam: 1.80 cm MV A velocity: 72.80 cm/s MV E/A ratio:  1.37 Aditya Sabharwal Electronically signed by Alwin Baars Signature Date/Time: 07/06/2023/1:09:01 PM    Final (Updated)    DG CHEST PORT 1 VIEW Result Date: 07/04/2023 CLINICAL DATA:  Status post PICC placement EXAM: PORTABLE CHEST 1 VIEW COMPARISON:  Chest radiograph dated 07/04/2023 at 11:34 a.m. FINDINGS: Lines/tubes: Left IJ PA catheter tip projects over the main pulmonary artery. Interval repositioning of the right upper extremity PICC. Tip now projects over the superior cavoatrial junction, allowing for low lung volumes. Lungs: Asymmetric elevation of the right hemidiaphragm with asymmetrically lower right lung volumes. Right mid and left lower lung linear opacities. Pleura: Unchanged blunting of the right costophrenic angle. No pneumothorax. Heart/mediastinum: Right heart border remains obscured. Bones: No acute osseous abnormality. IMPRESSION: 1. Interval repositioning of the right upper extremity PICC. Tip now projects over the superior cavoatrial junction, allowing for low lung volumes. 2. Unchanged blunting of the right costophrenic angle, which may represent a small pleural effusion or atelectasis. 3. Right mid and left lower lung linear opacities, likely atelectasis. Electronically Signed   By: Limin  Xu M.D.   On: 07/04/2023 15:02   DG CHEST PORT 1 VIEW Result Date: 07/04/2023 CLINICAL DATA:  PICC line placement EXAM: PORTABLE CHEST 1 VIEW COMPARISON:  07/02/2023 FINDINGS: Removal of the Impella device. Left IJ Swan-Ganz catheter tip in proximal right  pulmonary artery. Placement of a right-sided PICC line. This terminates over the mid SVC and is angulated, likely entering the azygous vein. Moderate  to marked right hemidiaphragm elevation. Midline trachea. Mild cardiomegaly. No pleural effusion or pneumothorax. Improved mild interstitial edema. Improved bibasilar airspace disease. IMPRESSION: Right-sided PICC line likely enters the azygous vein. If superior caval/atrial junction location is desired, this should be retracted and readvanced 3-4 cm. Improved aeration with decreased interstitial edema and bibasilar airspace disease. No pneumothorax or other acute complication. Electronically Signed   By: Lore Rode M.D.   On: 07/04/2023 13:36   US  EKG SITE RITE Result Date: 07/04/2023 If Site Rite image not attached, placement could not be confirmed due to current cardiac rhythm.  DG CHEST PORT 1 VIEW Result Date: 07/02/2023 CLINICAL DATA:  Heart failure. EXAM: PORTABLE CHEST 1 VIEW COMPARISON:  06/30/2023 FINDINGS: The cardio pericardial silhouette is enlarged. Right IJ intra-aortic balloon pump and left IJ pulmonary artery catheter are stable in position. Asymmetric elevation right hemidiaphragm again noted. Persistent basilar atelectasis with probable bilateral effusions. Telemetry leads overlie the chest. IMPRESSION: Persistent basilar atelectasis with probable bilateral effusions. Stable position of support apparatus. Electronically Signed   By: Donnal Fusi M.D.   On: 07/02/2023 07:44   DG CHEST PORT 1 VIEW Result Date: 06/30/2023 CLINICAL DATA:  Right ventricular assist device. EXAM: PORTABLE CHEST 1 VIEW COMPARISON:  06/29/2023 FINDINGS: Low volume film with asymmetric elevation of the right hemidiaphragm, similar to prior. Vascular congestion and bibasilar collapse/consolidation is not substantially changed. Probable small bilateral pleural effusions. Right IJ Impella device overlies the main pulmonary artery, similar to prior with tip projecting over the left main pulmonary artery. Left IJ pulmonary artery catheter tip overlies the proximal right main pulmonary artery. Heart size stable.  Telemetry leads overlie the chest. IMPRESSION: 1. No substantial change in vascular congestion and bibasilar collapse/consolidation. 2. Probable small bilateral pleural effusions. Electronically Signed   By: Donnal Fusi M.D.   On: 06/30/2023 07:38   DG Chest Port 1 View Result Date: 06/29/2023 CLINICAL DATA:  Central line placement. EXAM: PORTABLE CHEST 1 VIEW COMPARISON:  06/28/2023 FINDINGS: Similar low lung volumes with asymmetric elevation right hemidiaphragm. The cardio pericardial silhouette is enlarged. There is pulmonary vascular congestion without overt pulmonary edema. Basilar atelectasis with probable small left pleural effusion. Left IJ pulmonary catheter tip is in the main pulmonary artery. Stable position of the Impella device overlying the main pulmonary artery. Left chest tube again noted without evidence for left-sided pneumothorax. IMPRESSION: 1. Left IJ pulmonary catheter tip is in the main pulmonary artery. Stable position of the Impella device with tip overlying the main pulmonary artery. 2. Low lung volumes with basilar atelectasis and probable small left pleural effusion. Electronically Signed   By: Donnal Fusi M.D.   On: 06/29/2023 07:29   DG Chest Port 1 View Result Date: 06/28/2023 CLINICAL DATA:  Central line placement EXAM: PORTABLE CHEST 1 VIEW COMPARISON:  June 28, 2023, 5:16 a.m. FINDINGS: Tip of the Swan-Ganz in the right main proximal pulmonary artery. The tip of the Impella catheter device is in the outflow portion of the main pulmonary artery. Left chest tube. No pneumothorax No congestive changes No significant pleural effusions IMPRESSION: *No acute cardiopulmonary process. *Support devices as above. Electronically Signed   By: Fredrich Jefferson M.D.   On: 06/28/2023 09:24   DG CHEST PORT 1 VIEW Result Date: 06/28/2023 CLINICAL DATA:  [id number].  Right ventricular assist device present. EXAM: PORTABLE CHEST 1 VIEW  COMPARISON:  Portable chest yesterday at 4:33 p.m.  FINDINGS: 5:16 a.m. Left IJ approach catheter again terminates in the right lung base probably in a lower lobe branch. Right IJ approach Impella device as before terminates in the main pulmonary artery. The cardiomediastinal silhouette and vascular pattern are normal. There is calcification of the transverse aorta. There is increased linear atelectasis in the left base. The lungs are otherwise clear. There is no substantial pleural effusion. Overall aeration is otherwise unchanged. IMPRESSION: 1. Increased linear atelectasis in the left base. No other interval change. 2. Left IJ approach catheter again terminates in the right lung base probably in a lower lobe branch. 3. Right IJ approach Impella device as before terminates in the main pulmonary artery. Electronically Signed   By: Denman Fischer M.D.   On: 06/28/2023 05:56   CARDIAC CATHETERIZATION Result Date: 06/27/2023   The left ventricular ejection fraction is greater than 65% by visual estimate. Findings: On NE 15 Ao = 123/68(93) LV = 97/19 RA = 18 RV = 35/17 PA =  35/21 (26) PCW = 19 Fick cardiac output/index = 2.3/1.3 Thermodilution CO/CI = 2.9/1.6 PVR = 0.8 WU Ao sat = 96% PA sat = 37%, 41% PAPi = 0.78 Assessment: 1. Left dominant coronary system with normal LM, LAD and LCx. Small non-dominant RCA not visualized (previously seen in 2017) 2. LVEF 65% 3. Evidence of severe RV failure with profound shock requiring placement of Impella RP support device Jules Oar, MD 11:44 PM  US  Abdomen Limited RUQ (LIVER/GB) Result Date: 06/27/2023 CLINICAL DATA:  Prominent gallbladder on recent CT examination EXAM: ULTRASOUND ABDOMEN LIMITED RIGHT UPPER QUADRANT COMPARISON:  CT from earlier in the same day. FINDINGS: Gallbladder: No gallstones or wall thickening visualized. No sonographic Murphy sign noted by sonographer. Common bile duct: Diameter: 2.7 mm Liver: Mildly increased in echogenicity consistent with fatty infiltration. Portal vein is patent on color  Doppler imaging with normal direction of blood flow towards the liver. Other: None. IMPRESSION: Fatty liver. No acute abnormality in the gallbladder. Electronically Signed   By: Violeta Grey M.D.   On: 06/27/2023 21:45   ECHOCARDIOGRAM LIMITED Result Date: 06/27/2023    ECHOCARDIOGRAM LIMITED REPORT   Patient Name:   SHIVALI QUACKENBUSH Date of Exam: 06/27/2023 Medical Rec #:  [id number]       Height:       63.0 in Accession #:    [id number]      Weight:       160.0 lb Date of Birth:  1950/03/21       BSA:          1.759 m Patient Age:    73 years        BP:           142/102 mmHg Patient Gender: F               HR:           72 bpm. Exam Location:  Inpatient Procedure: Limited Echo (Both Spectral and Color Flow Doppler were utilized            during procedure). Indications:    I50.9* Heart failure (unspecified)  History:        Patient has prior history of Echocardiogram examinations, most                 recent 06/27/2023. CHF; CAD.  Sonographer:    Meagan Baucom RDCS, FE, PE Sonographer#2:  Raynelle Callow RDCS Referring Phys: [phone]  ADITYA SABHARWAL  Sonographer Comments: Technically difficult study due to poor echo windows. Image acquisition challenging due to breast implants. Mclean present. Impella device in PA. IMPRESSIONS  1. Left ventricular ejection fraction, by estimation, is 60 to 65%. The left ventricle has normal function. The left ventricle has no regional wall motion abnormalities.  2. RV strain attempted- unable to analyze form subcostal     RV Impella device noted.     Inlet port visualized at IVC/RA junction     Cannula traverses tricuspid valve along the RV free wall, no entanglement     Outlet port visualized in main PA     Tricuspid regurgitation not assessed. Right ventricular systolic function is moderately reduced.  3. The inferior vena cava is normal in size with <50% respiratory variability, suggesting right atrial pressure of 8 mmHg. FINDINGS  Left Ventricle: Left ventricular ejection fraction,  by estimation, is 60 to 65%. The left ventricle has normal function. The left ventricle has no regional wall motion abnormalities. Right Ventricle: RV strain attempted- unable to analyze form subcostal RV Impella device noted. Inlet port visualized at IVC/RA junction Cannula traverses tricuspid valve along the RV free wall, no entanglement Outlet port visualized in main PA Tricuspid regurgitation not assessed. Right ventricular systolic function is moderately reduced. Pulmonary Artery: The pulmonary artery is of normal size. Venous: The inferior vena cava is normal in size with less than 50% respiratory variability, suggesting right atrial pressure of 8 mmHg. Gloriann Larger MD Electronically signed by Gloriann Larger MD Signature Date/Time: 06/27/2023/5:51:13 PM    Final    DG CHEST PORT 1 VIEW Result Date: 06/27/2023 CLINICAL DATA:  [id number] Central venous catheter in place, temporary [id number] EXAM: PORTABLE CHEST - 1 VIEW COMPARISON:  June 27, 2023, 10:34 a.m. FINDINGS: Elevation of the right hemidiaphragm. No focal airspace consolidation, pleural effusion, or pneumothorax. No cardiomegaly. Right IJ approach Impella device, terminating in the region of the main pulmonary artery. Left IJ approach introducer sheath with a pulmonary artery catheter, which terminates in the right lung base. IMPRESSION: 1. Right IJ approach Impella device terminating in the region the main pulmonary artery. 2. Left IJ approach pulmonary artery catheter terminating in the right lung base. 3. No pneumonia or pulmonary edema. Electronically Signed   By: Rance Burrows M.D.   On: 06/27/2023 16:51   ECHOCARDIOGRAM COMPLETE Result Date: 06/27/2023    ECHOCARDIOGRAM REPORT   Patient Name:   FRANCHESCA VENEZIANO Date of Exam: 06/27/2023 Medical Rec #:  [id number]       Height:       63.0 in Accession #:    [id number]      Weight:       160.0 lb Date of Birth:  04-01-1950       BSA:          1.759 m Patient Age:    73 years        BP:            69/45 mmHg Patient Gender: F               HR:           45 bpm. Exam Location:  Inpatient Procedure: 2D Echo, Cardiac Doppler and Color Doppler (Both Spectral and Color            Flow Doppler were utilized during procedure). Indications:    Shock  History:        Patient has no prior history of Echocardiogram examinations.  Sonographer:    Astrid Blamer Referring Phys: [id number] BRITTAINY M SIMMONS IMPRESSIONS  1. Left ventricular ejection fraction, by estimation, is 60 to 65%. The left ventricle has normal function. Left ventricular endocardial border not optimally defined to evaluate regional wall motion. Left ventricular diastolic function could not be evaluated.  2. 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 Right ventricular systolic function is mildly reduced. The right ventricular size is normal.  3. The mitral valve is grossly normal. No evidence of mitral valve regurgitation.  4. The aortic valve was not well visualized. Aortic valve regurgitation is not visualized.  5. The inferior vena cava is dilated in size with <50% respiratory variability, suggesting right atrial pressure of 15 mmHg.  6. This is a limited study. This study does not evaluated all parameters for restrictive cardiomyopathy. Consider repeat study in the future if clinically indicated; at that time TDI would be usefuly. Comparison(s): No prior Echocardiogram. FINDINGS  Left Ventricle: Left ventricular ejection fraction, by estimation, is 60 to 65%. The left ventricle has normal function. Left ventricular endocardial border not optimally defined to evaluate regional wall motion. The left ventricular internal cavity size was normal in size. Suboptimal image quality limits for assessment of left ventricular hypertrophy. Left ventricular diastolic function could not be evaluated. 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 The right ventricular  size is normal. No increase in right ventricular wall thickness. Right ventricular systolic function is mildly reduced. Left Atrium: Left atrial size was not well visualized. Right Atrium: Right atrial size was not well visualized. Pericardium: Pericardium appears thick but is not well visualized. This is best seen in clip 28. There is no evidence of pericardial effusion. The pericardial effusion appears to contain mixed echogenic material. Thickening/calcification of pericardium present. Mitral Valve: The mitral valve is grossly normal. No evidence of mitral valve regurgitation. Tricuspid Valve: The tricuspid valve is not well visualized. Tricuspid valve regurgitation is not demonstrated. Aortic Valve: The aortic valve was not well visualized. Aortic valve regurgitation is not visualized. Pulmonic Valve: The pulmonic valve was not well visualized. Pulmonic valve regurgitation is not visualized. Aorta: The ascending aorta was not well visualized and the aortic arch was not well visualized. Venous: The inferior vena cava is dilated in size with less than 50% respiratory variability, suggesting right atrial pressure of 15 mmHg. IAS/Shunts: The interatrial septum was not well visualized. Additional Comments: A venous catheter is visualized in the right atrium. Gloriann Larger MD Electronically signed by Gloriann Larger MD Signature Date/Time: 06/27/2023/2:20:32 PM    Final    CT Angio Chest/Abd/Pel for Dissection W and/or W/WO Result Date: 06/27/2023 CLINICAL DATA:  Acute aortic syndrome EXAM: CT ANGIOGRAPHY CHEST, ABDOMEN AND PELVIS TECHNIQUE: Non-contrast CT of the chest was initially obtained. Multidetector CT imaging through the chest, abdomen and pelvis was performed using the standard protocol during bolus administration of intravenous contrast. Multiplanar reconstructed images and MIPs were obtained and reviewed to evaluate the vascular anatomy. RADIATION DOSE REDUCTION: This exam was performed  according to the departmental dose-optimization program which includes automated exposure control, adjustment of the mA and/or kV according to patient size and/or use of iterative reconstruction technique. CONTRAST:  OMNIPAQUE  IOHEXOL  350 MG/ML SOLN COMPARISON:  CT abdomen and pelvis January 06, 2016 FINDINGS: CTA CHEST FINDINGS Cardiovascular: Satisfactory opacification of the pulmonary arteries to the segmental level. No evidence of pulmonary embolism. Normal heart size. No pericardial effusion. Subtle calcifications of the  aortic wall without coronary artery calcifications. Mediastinum/Nodes: No enlarged mediastinal, hilar, or axillary lymph nodes. Thyroid  gland, trachea, and esophagus demonstrate no significant findings. Lungs/Pleura: Subtle ground-glass appearance of both lungs with fibro atelectatic changes of the right upper lobe may correlate with chronic interstitial lung disease without evidence of acute infiltrates consolidations or pulmonary edema. No pulmonary nodules. No pneumonia or pleural effusions Musculoskeletal: No chest wall abnormality. No acute or significant osseous findings. Review of the MIP images confirms the above findings. CTA ABDOMEN AND PELVIS FINDINGS VASCULAR Aorta: Normal caliber aorta without aneurysm, dissection, vasculitis or significant stenosis. Irregular atheromatous plaque along the lateral wall of the aorta from the 1 to 5 o'clock distribution within the mid and distal descending thoracic aorta with a small ulcerated plaque on image number 66 these findings could correlate with a developing intramural hematoma measuring about 8 mm in maximum thickness by 14 mm in maximum AP diameter as measured on image number 70. Celiac: Patent without evidence of aneurysm, dissection, vasculitis or significant stenosis. SMA: Patent without evidence of aneurysm, dissection, vasculitis or significant stenosis. Renals: Both renal arteries are patent without evidence of aneurysm,  dissection, vasculitis, fibromuscular dysplasia or significant stenosis. IMA: Patent without evidence of aneurysm, dissection, vasculitis or significant stenosis. Inflow: Patent without evidence of aneurysm, dissection, vasculitis or significant stenosis. Veins: No obvious venous abnormality within the limitations of this arterial phase study. Review of the MIP images confirms the above findings. NON-VASCULAR Hepatobiliary: Liver normal. Stable 10 mm hepatic cyst anterolateral margin of the right lobe of the liver compared with prior examination. Gallbladder appears slightly distended mildly prominent with poor definition of the fundus. Consider ultrasound evaluation to rule out associated acute cholecystitis. Pancreas: Unremarkable. No pancreatic ductal dilatation or surrounding inflammatory changes. Spleen: Normal in size without focal abnormality. Adrenals/Urinary Tract: Adrenal glands are unremarkable. Kidneys are normal, without renal calculi, focal lesion, or hydronephrosis. Bladder is unremarkable. Stomach/Bowel: Stomach is within normal limits. Appendix appears normal. No evidence of bowel wall thickening, distention, or inflammatory changes. Lymphatic: Unremarkable Reproductive: Uterus and bilateral adnexa are unremarkable. Other: No abdominal wall hernia or abnormality. No abdominopelvic ascites. Musculoskeletal: No fracture is seen. Post L4-5 transpedicular fixation and disc arthroplasty Review of the MIP images confirms the above findings. IMPRESSION: *No evidence of pulmonary embolism. *Irregular atheromatous plaque along the lateral wall of the mid and distal descending thoracic aorta with a small ulcerated plaque. These findings could correlate with a developing intramural hematoma measuring about 8 mm in maximum thickness by 14 mm in maximum AP diameter as measured on image number 70. (Intramural hematoma Stanford type B) *Subtle ground-glass appearance of both lungs with fibro atelectatic changes of  the right upper lobe may correlate with chronic interstitial lung disease without evidence of acute infiltrates consolidations or pulmonary edema. *Gallbladder appears slightly distended mildly prominent with poor definition of the fundus. Consider ultrasound evaluation to exclude associated acute cholecystitis. Critical Value/emergent results were called by telephone at the time of interpretation on 06/27/2023 at 11:09 am to provider [facility] , who verbally acknowledged these results. Electronically Signed   By: Fredrich Jefferson M.D.   On: 06/27/2023 11:10   DG Chest Portable 1 View Result Date: 06/27/2023 CLINICAL DATA:  Shortness of breath and midsternal chest pain radiating to the back and jaw for the past few days. Recently diagnosed with atrial fibrillation. EXAM: PORTABLE CHEST 1 VIEW COMPARISON:  12/31/2018 FINDINGS: The heart size and mediastinal contours are within normal limits. Both lungs are clear. The visualized  skeletal structures are unremarkable. IMPRESSION: No active disease. Electronically Signed   By: Catherin Closs M.D.   On: 06/27/2023 10:42    EKG: 07/04/2023 showed junctional rhythm in the 40s ? AF vs Sinus arrest (personally reviewed)  EKG 6/10 shows AF/AFL with slow VR  Baseline EKG 01/24/2023 showed NSR in the 80s without conduction disease.   TELEMETRY: Intermittent Junctional rhythm with V pacing and intermittent PVCs. Brief periods of NSR and  (personally reviewed)  Assessment/Plan:  Junctional rhythm SND -> ? Sinus Arrest Tachy brady TVP in place with intermittent CHB and AF with RVR Transvenous device with very high risk of infection as below.  Leadless a consideration, but with transient RV failure and other acute issues is a less attractive option.  Temp-Perm as bridge to decision and recovery could be a reasonable option that would allow for easier rehabilitation.   Acute RV failue Acute diastolic CHF NSTEMI with RV infarct Plan per primary.  Echo 6/19  with improved LVEF to 60-65%, now normal RV, small pericardial effusion.  Paroxysmal AF Noted on EKG 6/10 and paroxysmally here On heparin .   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  Early transvenous device would carry a significant risk of infection.    For questions or updates, please contact [facility] Please consult [email or web address] for contact info under   Signed, Tylene Galla, PA-C  07/07/2023, 11:49 AM     I have seen, examined the patient, and reviewed the above assessment and plan.    HPI: This is a 73 year old female who presents to the hospital with malaise, weakness, tearing chest pains and found to have acute NSTEMI, possible RCA occlusion and RV failure.  Her course has been complicated by atrial fibrillation and bradycardia, now s/p temporary pacer. Slowly recovering in the ICU, weaning pressors. Feeling better today.  General: Critically ill, in no acute distress.  Neck: Right IJ temp pacer Cardiac: Normal rate, regular rhythm.  Resp: Normal work of breathing.  Ext: No edema.  Neuro: No gross focal deficits.  Psych: Normal affect.   Assessment and Plan:  #. Bradycardia: Since stopping dobutamine , patient has become pacer dependent.  She was previously in sinus rhythm conducting one-to-one earlier today.  Suspicion would be for sinus node dysfunction and sinus arrest, as with weaning pacing there appears to be junctional rhythm with retrograde atrial activity, although difficult to discern P waves with confidence on telemetry.  Other alternative would be for atrial fibrillation with complete heart block and a junctional  escape.  Patient is unlikely to tolerate bradycardia well given her acute RV failure.  There are also concerns for active infection with staph bacteremia.  Thus, she is not a candidate for permanent pacemaker at this time.  Hopefully, as she recovers clinically, we will see improvement in her sinus node function/conduction.  If there is no improvement, then a temp perm could be considered as we wait for final evaluation of her infectious workup. - Leave right IJ temporary pacing wire in place for now.  If she tolerates hemodynamically then recommend programming for backup pacing only so that we can assess for recovery of sinus node function and presence of conduction  disease.  Capture threshold of 2.  Programmed output at 10. - We will evaluate over the weekend and possibly place a temp perm on Monday as bridge to recovery.    #. Paroxysmal atrial fibrillation:  - Continue heparin .   #. Staph bacteremia: -Patient will need TEE at some point early next week to rule out endocarditis prior to any permanent device implant.  Ardeen Kohler, MD 07/07/2023 5:35 PM

## 2023-07-07 NOTE — TOC Progression Note (Signed)
 Transition of Care [facility]) - Progression Note    Patient Details  Name: Vicki [name] MRN: [id number] Date of Birth: 1950-10-02  [facility] CM/SW Contact  Benjiman Bras, RN Phone Number: [phone] 07/07/2023, 4:59 PM  Clinical Narrative:     TOC CM spoke to pt at bedside. States she is agreeable to IP rehab or HH. Does not have DME in home. She was independent pta.  Husband at home to assist with care.  Will need HH PT orders with [facility]. Will order DME 3n1 bedside commode closer to dc.     Expected Discharge Plan: Home w Home Health Services Barriers to Discharge: Continued Medical Work up  Expected Discharge Plan and Services   Discharge Planning Services: CM Consult   Living arrangements for the past 2 months: Single Family Home                                       Social Determinants of Health (SDOH) Interventions SDOH Screenings   Food Insecurity: No Food Insecurity (06/28/2023)  Housing: Low Risk  (06/28/2023)  Transportation Needs: No Transportation Needs (06/28/2023)  Utilities: Not At Risk (06/28/2023)  Social Connections: Socially Integrated (06/28/2023)  Tobacco Use: Medium Risk (06/27/2023)    Readmission Risk Interventions     No data to display

## 2023-07-08 ENCOUNTER — Inpatient Hospital Stay (HOSPITAL_COMMUNITY)

## 2023-07-08 DIAGNOSIS — R570 Cardiogenic shock: Secondary | ICD-10-CM | POA: Diagnosis not present

## 2023-07-08 LAB — BASIC METABOLIC PANEL WITH GFR
Anion gap: 17 — ABNORMAL HIGH (ref 5–15)
Anion gap: 5 (ref 5–15)
BUN: 8 mg/dL (ref 8–23)
BUN: 10 mg/dL (ref 8–23)
CO2: 23 mmol/L (ref 22–32)
CO2: 30 mmol/L (ref 22–32)
Calcium: 6.6 mg/dL — ABNORMAL LOW (ref 8.9–10.3)
Calcium: 8.3 mg/dL — ABNORMAL LOW (ref 8.9–10.3)
Chloride: 81 mmol/L — ABNORMAL LOW (ref 98–111)
Chloride: 94 mmol/L — ABNORMAL LOW (ref 98–111)
Creatinine, Ser: 1.09 mg/dL — ABNORMAL HIGH (ref 0.44–1.00)
Creatinine, Ser: 0.67 mg/dL (ref 0.44–1.00)
GFR, Estimated: 54 mL/min — ABNORMAL LOW (ref >60)
GFR, Estimated: >60 mL/min (ref >60)
Glucose, Bld: 533 mg/dL (ref 70–99)
Glucose, Bld: 158 mg/dL — ABNORMAL HIGH (ref 70–99)
Potassium: 3.7 mmol/L (ref 3.5–5.1)
Potassium: 4.4 mmol/L (ref 3.5–5.1)
Sodium: 121 mmol/L — ABNORMAL LOW (ref 135–145)
Sodium: 129 mmol/L — ABNORMAL LOW (ref 135–145)

## 2023-07-08 LAB — HEPARIN LEVEL (UNFRACTIONATED): Heparin Unfractionated: 0.35 [IU]/mL (ref 0.30–0.70)

## 2023-07-08 LAB — GLUCOSE, CAPILLARY: Glucose-Capillary: 107 mg/dL — ABNORMAL HIGH (ref 70–99)

## 2023-07-08 LAB — CBC
HCT: 25.7 % — ABNORMAL LOW (ref 36.0–46.0)
Hemoglobin: 8.5 g/dL — ABNORMAL LOW (ref 12.0–15.0)
MCH: 31.6 pg (ref 26.0–34.0)
MCHC: 33.1 g/dL (ref 30.0–36.0)
MCV: 95.5 fL (ref 80.0–100.0)
Platelets: 197 10*3/uL (ref 150–400)
RBC: 2.69 MIL/uL — ABNORMAL LOW (ref 3.87–5.11)
RDW: 13.7 % (ref 11.5–15.5)
WBC: 8.9 10*3/uL (ref 4.0–10.5)
nRBC: 0 % (ref 0.0–0.2)

## 2023-07-08 LAB — COOXEMETRY PANEL
Carboxyhemoglobin: 3.2 % — ABNORMAL HIGH (ref 0.5–1.5)
Methemoglobin: 1.3 % (ref 0.0–1.5)
O2 Saturation: 71.2 %
Total hemoglobin: 8.4 g/dL — ABNORMAL LOW (ref 12.0–16.0)

## 2023-07-08 LAB — MAGNESIUM: Magnesium: 1.8 mg/dL (ref 1.7–2.4)

## 2023-07-08 MED ORDER — MAGNESIUM SULFATE 2 GM/50ML IV SOLN
2.0000 g | Freq: Once | INTRAVENOUS | Status: AC
  Administered 2023-07-08: 2 g via INTRAVENOUS
  Filled 2023-07-08: qty 50

## 2023-07-08 MED ORDER — TORSEMIDE 20 MG PO TABS
20.0000 mg | ORAL_TABLET | Freq: Every day | ORAL | Status: DC
  Administered 2023-07-08 – 2023-07-14 (×7): 20 mg via ORAL
  Filled 2023-07-08 (×7): qty 1

## 2023-07-08 NOTE — Plan of Care (Signed)
  Problem: Education: Goal: Knowledge of General Education information will improve Description: Including pain rating scale, medication(s)/side effects and non-pharmacologic comfort measures Outcome: Progressing   Problem: Health Behavior/Discharge Planning: Goal: Ability to manage health-related needs will improve Outcome: Progressing   Problem: Clinical Measurements: Goal: Ability to maintain clinical measurements within normal limits will improve Outcome: Progressing Goal: Will remain free from infection Outcome: Progressing Goal: Diagnostic test results will improve Outcome: Progressing Goal: Respiratory complications will improve Outcome: Progressing Goal: Cardiovascular complication will be avoided Outcome: Progressing   Problem: Activity: Goal: Risk for activity intolerance will decrease Outcome: Progressing   Problem: Nutrition: Goal: Adequate nutrition will be maintained Outcome: Progressing   Problem: Coping: Goal: Level of anxiety will decrease Outcome: Progressing   Problem: Elimination: Goal: Will not experience complications related to bowel motility Outcome: Progressing Goal: Will not experience complications related to urinary retention Outcome: Progressing   Problem: Pain Managment: Goal: General experience of comfort will improve and/or be controlled Outcome: Progressing   Problem: Safety: Goal: Ability to remain free from injury will improve Outcome: Progressing   Problem: Skin Integrity: Goal: Risk for impaired skin integrity will decrease Outcome: Progressing   Problem: Cardiac: Goal: Ability to achieve and maintain adequate cardiopulmonary perfusion will improve Outcome: Progressing Goal: Vascular access site(s) Level 0-1 will be maintained Outcome: Progressing   Problem: Fluid Volume: Goal: Ability to achieve a balanced intake and output will improve Outcome: Progressing   Problem: Physical Regulation: Goal: Complications related  to the disease process, condition or treatment will be avoided or minimized Outcome: Progressing   Problem: Respiratory: Goal: Will regain and/or maintain adequate ventilation Outcome: Progressing   Problem: Coping: Goal: Ability to adjust to condition or change in health will improve Outcome: Progressing   Problem: Fluid Volume: Goal: Ability to maintain a balanced intake and output will improve Outcome: Progressing   Problem: Health Behavior/Discharge Planning: Goal: Ability to identify and utilize available resources and services will improve Outcome: Progressing Goal: Ability to manage health-related needs will improve Outcome: Progressing   Problem: Metabolic: Goal: Ability to maintain appropriate glucose levels will improve Outcome: Progressing   Problem: Nutritional: Goal: Maintenance of adequate nutrition will improve Outcome: Progressing Goal: Progress toward achieving an optimal weight will improve Outcome: Progressing   Problem: Skin Integrity: Goal: Risk for impaired skin integrity will decrease Outcome: Progressing   Problem: Tissue Perfusion: Goal: Adequacy of tissue perfusion will improve Outcome: Progressing   Problem: Education: Goal: Understanding of CV disease, CV risk reduction, and recovery process will improve Outcome: Progressing Goal: Individualized Educational Video(s) Outcome: Progressing   Problem: Activity: Goal: Ability to return to baseline activity level will improve Outcome: Progressing   Problem: Cardiovascular: Goal: Ability to achieve and maintain adequate cardiovascular perfusion will improve Outcome: Progressing Goal: Vascular access site(s) Level 0-1 will be maintained Outcome: Progressing   Problem: Health Behavior/Discharge Planning: Goal: Ability to safely manage health-related needs after discharge will improve Outcome: Progressing

## 2023-07-08 NOTE — Progress Notes (Signed)
 Advanced Heart Failure Rounding Note  HF Cardiologist: Rolan Fuel, MD Chief Complaint: Cardiogenic Shock Subjective:   Off dobutamine  currently. Remains in sinus brady with rates in the 30-40s, v paced at 70. Tentative plan for TEE on 6/23 with atrial temp perm following. Blood cultures clear.     Objective:    Vital Signs:   Temp:  [97.8 F (36.6 C)-98 F (36.7 C)] 98 F (36.7 C) (06/21 1200) Pulse Rate:  [66-79] 72 (06/21 1400) Resp:  [10-28] 20 (06/21 1400) BP: (77-165)/(53-105) 134/84 (06/21 1400) SpO2:  [83 %-100 %] 99 % (06/21 1400) Weight:  [75.1 kg] 75.1 kg (06/21 0500) Last BM Date : 07/08/23  Weight change: Filed Weights   07/06/23 0500 07/07/23 0447 07/08/23 0500  Weight: 78.4 kg 78.4 kg 75.1 kg   Intake/Output:  Intake/Output Summary (Last 24 hours) at 07/08/2023 1454 Last data filed at 07/08/2023 1200 Gross per 24 hour  Intake 567.87 ml  Output 1150 ml  Net -582.13 ml  CVP 5-6   General:   No resp difficulty + TVP  Neck: supple. no JVD.  Cor: Paced rhythm, no m/g/r Lungs: clear Abdomen: soft, nontender, nondistended.    Telemetry:  Paced at 70  Labs: Basic Metabolic Panel: Recent Labs  Lab 07/04/23 0354 07/04/23 1402 07/05/23 0427 07/05/23 0730 07/06/23 0430 07/06/23 1615 07/07/23 0500 07/08/23 0525 07/08/23 1007  NA 123*   < > 120*   < > 121* 125* 125* 121* 129*  K 3.9   < > 6.1*   < > 3.7 3.9 3.6 3.7 4.4  CL 85*   < > 83*   < > 84* 84* 85* 81* 94*  CO2 28   < > 25   < > 27 29 29 23 30   GLUCOSE 113*   < > 210*   < > 123* 94 118* 533* 158*  BUN 7*   < > 15   < > 20 18 15 8 10   CREATININE 0.61   < > 0.96   < > 0.82 0.79 0.70 1.09* 0.67  CALCIUM  8.3*   < > 8.1*   < > 8.1* 8.3* 8.4* 6.6* 8.3*  MG 2.0  --  2.3  --  2.0  --  1.8 1.8  --    < > = values in this interval not displayed.   Liver Function Tests: Recent Labs  Lab 07/02/23 0434 07/04/23 0500  AST 23 27  ALT 45* 35  ALKPHOS 55 74  BILITOT 1.2 0.8  PROT 5.1* 5.4*   ALBUMIN 2.4* 2.4*   CBC: Recent Labs  Lab 07/04/23 0354 07/05/23 0427 07/06/23 0430 07/07/23 0500 07/08/23 0525  WBC 7.6 15.4* 12.6* 10.7* 8.9  HGB 9.5* 10.3* 9.3* 9.4* 8.5*  HCT 27.6* 30.1* 26.8* 27.5* 25.7*  MCV 93.6 92.9 91.8 92.9 95.5  PLT 100* 142* 149* 179 197   BNP (last 3 results) Recent Labs    06/27/23 1020  BNP 543.0*   Medications:    Scheduled Medications:  aspirin  EC  81 mg Oral Daily   Chlorhexidine  Gluconate Cloth  6 each Topical Daily   feeding supplement  237 mL Oral BID BM   levothyroxine   50 mcg Oral Daily   lidocaine   1 patch Transdermal Q24H   multivitamin with minerals  1 tablet Oral Daily   pantoprazole   40 mg Oral Daily   polyethylene glycol  17 g Oral BID   senna  1 tablet Oral QHS   sertraline   100  mg Oral Daily   sodium chloride  flush  10-40 mL Intracatheter Q12H   sodium chloride  flush  3 mL Intravenous Q12H   Infusions:   ceFAZolin  (ANCEF ) IV 2 g (07/08/23 1426)   heparin  1,250 Units/hr (07/08/23 0810)   PRN Medications: sodium chloride , acetaminophen , lip balm, ondansetron  (ZOFRAN ) IV, mouth rinse, oxyCODONE , sodium chloride  flush, sodium chloride  flush  Assessment/Plan:   Cardiogenic Shock, 2/2 Profound RV Failure --> Resolved - cath 6/10 normal left-dominant system with non-visualized tiny RCA - Echo LVEF 60-65% RV moderate HK with McConnell's sign - RHC 6/10 (on NE 15): RA 18, PA 35/21 (26), PCWP 19, CO/CI (TD) 2.9/1.6, PVR 0.8, PAPi 0.78 - CT w/o PE - s/p Impella RP placement. Removed 6/15. - POCUS ECHO 6/12 w/ improvement LVEF 60% RV mild HK  - 6/`19 Echo No evidence of endocarditis. RV normal. LV 60-65%.  - Stopped dobutamine  on 6/20, requiring pacing - Coox 71 and stable - Continue PICC line - Start torsemide  20mg  daily  Acute diastolic HF - plan as above  NSTEMI with RV infarct - plan as above - continue ASA - no b-blocker with shock  Tachy-brady Syndrome - Previously in/out AF in 120-130s and acclerated  junctional in 40s. - Now in sinus bradycardia, rates in the 30s. 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 Na 129 - restrict FW  Thrombocytopenia - heparin  switched to bival on 6/13. PLTs stabilized/improved off heparin  - SRA negative - continue heparin  gtt - PLT within normal range.   Bacteremia: Line related source - Lines removed. Bcx 6/17 grew MSSE.  - WBC down 10.7  - On Ancef  - Formal echo to eval heart valves, no evidence of endocarditis.  - TEE Monday - ID following; appreciate recs -  6/19 blood cultures- NGTD   Hyperkalemia: resolved Shock Liver: resolved AKI: resolved  Length of Stay: 11   CRITICAL CARE Performed by: Morene JINNY Brownie   Total critical care time: 40 minutes  Critical care time was exclusive of separately billable procedures and treating other patients.  Critical care was necessary to treat or prevent imminent or life-threatening deterioration.  Critical care was time spent personally by me on the following activities: development of treatment plan with patient and/or surrogate as well as nursing, discussions with consultants, evaluation of patient's response to treatment, examination of patient, obtaining history from patient or surrogate, ordering and performing treatments and interventions, ordering and review of laboratory studies, ordering and review of radiographic studies, pulse oximetry and re-evaluation of patient's condition.   Morene JINNY Brownie, MD 07/08/2023, 2:54 PM  Advanced Heart Failure Team Pager [phone] (M-F; 7a - 5p)

## 2023-07-08 NOTE — Progress Notes (Signed)
 ANTICOAGULATION CONSULT NOTE  Pharmacy Consult for heparin >> bivalirudin  >>heparin  Indication: Impella RP >Afib  Allergies  Allergen Reactions   Codeine Other (See Comments)    headache   Crestor [Rosuvastatin] Other (See Comments)    Muscle aches   Other Nausea And Vomiting    Anesthesia-severe vomiting   Simvastatin Other (See Comments)    Muscle aches   Egg White (Egg Protein)     Other Reaction(s): Throat closes   Ezetimibe      Other Reaction(s): crippling muscle aches   Hydrocodone-Acetaminophen      Other Reaction(s): migraines   Peanut (Diagnostic)     Other Reaction(s): itchy   Shellfish Allergy Other (See Comments)    Throat swelling   Statins     Patient Measurements: Height: 5' 3 (160 cm) Weight: 75.1 kg (165 lb 9.1 oz) IBW/kg (Calculated) : 52.4 Heparin  Dosing Weight: 68 kg   Vital Signs: Temp: 97.8 F (36.6 C) (06/21 0430) Temp Source: Oral (06/20 2300) BP: 130/71 (06/21 0900) Pulse Rate: 73 (06/21 0900)  Labs: Recent Labs    07/06/23 0430 07/06/23 1615 07/07/23 0500 07/08/23 0525  HGB 9.3*  --  9.4* 8.5*  HCT 26.8*  --  27.5* 25.7*  PLT 149*  --  179 197  HEPARINUNFRC 0.34  --  0.49 0.35  CREATININE 0.82 0.79 0.70 1.09*    Estimated Creatinine Clearance: 45.3 mL/min (A) (by C-G formula based on SCr of 1.09 mg/dL (H)).   Assessment: 73 yo female in cardiogenic shock with RV failure s/p Impella RP 6/10.  Not on anticoagulation prior to admission.  Pharmacy consulted for heparin  dosing.  Chest CT w/ Type B dissection (small ulcerated plaque/developing intramural hematoma ~8 mm x 14 mm).   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  Discussed with team since HIT negative will change to heparin  infusion 6/17.   Heparin  level is therapeutic at 0.35, on 1250 units/hr. Hgb 8.5, plt 197. No s/sx of bleeding or infusion issues.   Goal of Therapy:  Heparin  level 0.3-0.7 units/ml Monitor platelets by anticoagulation protocol: Yes   Plan:  Continue heparin   IV 1250 units/hr  Monitor daily HL, CBC, and for s/sx of bleeding   Thank you for allowing pharmacy to participate in this patient's care,  Suzen Sour, PharmD, BCCCP Clinical Pharmacist  Phone: [phone] 07/08/2023 9:21 AM  Please check AMION for all [facility] Pharmacy phone numbers After 10:00 PM, call Main Pharmacy [phone]

## 2023-07-08 NOTE — Progress Notes (Signed)
 Progress Note  Patient Name: Vicki [name] Date of Encounter: 07/08/2023  Primary Cardiologist: None   Subjective   No chest pain. C/o her head feeling heavy.   Inpatient Medications    Scheduled Meds:  aspirin  EC  81 mg Oral Daily   Chlorhexidine  Gluconate Cloth  6 each Topical Daily   feeding supplement  237 mL Oral BID BM   levothyroxine   50 mcg Oral Daily   lidocaine   1 patch Transdermal Q24H   multivitamin with minerals  1 tablet Oral Daily   pantoprazole   40 mg Oral Daily   polyethylene glycol  17 g Oral BID   senna  1 tablet Oral QHS   sertraline   100 mg Oral Daily   sodium chloride  flush  10-40 mL Intracatheter Q12H   sodium chloride  flush  3 mL Intravenous Q12H   Continuous Infusions:   ceFAZolin  (ANCEF ) IV Stopped (07/08/23 0542)   heparin  1,250 Units/hr (07/08/23 0810)   PRN Meds: sodium chloride , acetaminophen , lip balm, ondansetron  (ZOFRAN ) IV, mouth rinse, oxyCODONE , sodium chloride  flush, sodium chloride  flush   Vital Signs    Vitals:   07/08/23 1145 07/08/23 1200 07/08/23 1215 07/08/23 1230  BP:   139/67 137/81  Pulse: 69  75 72  Resp: (!) 22  (!) 22 (!) 22  Temp:  98 F (36.7 C)    TempSrc:  Oral    SpO2: 98%  98% 97%  Weight:      Height:        Intake/Output Summary (Last 24 hours) at 07/08/2023 1257 Last data filed at 07/08/2023 1200 Gross per 24 hour  Intake 592.75 ml  Output 1350 ml  Net -757.25 ml   Filed Weights   07/06/23 0500 07/07/23 0447 07/08/23 0500  Weight: 78.4 kg 78.4 kg 75.1 kg    Telemetry    Probable sinus brady with ventricular pacing, intermittent capture, and junctional rhythm  - Personally Reviewed  ECG    none - Personally Reviewed  Physical Exam   GEN: ill appearing and dyspneic.   Neck: No JVD Cardiac: IRRR, soft systolic murmur  Respiratory: scattered rales. GI: Soft, nontender, non-distended  MS: No edema; No deformity. Neuro:  Nonfocal  Psych: Normal affect   Labs    Chemistry Recent  Labs  Lab 07/02/23 0434 07/02/23 1144 07/04/23 0500 07/04/23 1402 07/07/23 0500 07/08/23 0525 07/08/23 1007  NA 128*   < >  --    < > 125* 121* 129*  K 3.6   < >  --    < > 3.6 3.7 4.4  CL 91*   < >  --    < > 85* 81* 94*  CO2 28   < >  --    < > 29 23 30   GLUCOSE 109*   < >  --    < > 118* 533* 158*  BUN 5*   < >  --    < > 15 8 10   CREATININE 0.66   < >  --    < > 0.70 1.09* 0.67  CALCIUM  8.2*   < >  --    < > 8.4* 6.6* 8.3*  PROT 5.1*  --  5.4*  --   --   --   --   ALBUMIN 2.4*  --  2.4*  --   --   --   --   AST 23  --  27  --   --   --   --  ALT 45*  --  35  --   --   --   --   ALKPHOS 55  --  74  --   --   --   --   BILITOT 1.2  --  0.8  --   --   --   --   GFRNONAA >60   < >  --    < > >60 54* >60  ANIONGAP 9   < >  --    < > 11 17* 5   < > = values in this interval not displayed.     Hematology Recent Labs  Lab 07/06/23 0430 07/07/23 0500 07/08/23 0525  WBC 12.6* 10.7* 8.9  RBC 2.92* 2.96* 2.69*  HGB 9.3* 9.4* 8.5*  HCT 26.8* 27.5* 25.7*  MCV 91.8 92.9 95.5  MCH 31.8 31.8 31.6  MCHC 34.7 34.2 33.1  RDW 12.9 13.2 13.7  PLT 149* 179 197    Cardiac EnzymesNo results for input(s): TROPONINI in the last 168 hours. No results for input(s): TROPIPOC in the last 168 hours.   BNPNo results for input(s): BNP, PROBNP in the last 168 hours.   DDimer No results for input(s): DDIMER in the last 168 hours.   Radiology    DG CHEST PORT 1 VIEW Result Date: 07/08/2023 CLINICAL DATA:  Migration of central venous catheter chest EXAM: PORTABLE CHEST 1 VIEW COMPARISON:  Chest radiograph dated 07/06/2023 FINDINGS: Lines/tubes: Right internal jugular venous catheter tip projects over the main pulmonary artery trunk. Right upper extremity PICC tip projects over the superior cavoatrial junction. Previously noted left-sided catheter is no longer seen. Lungs: Unchanged asymmetric elevation of the right hemidiaphragm with low lung volumes with bronchovascular crowding.  Diffuse bilateral interstitial opacities. Pleura: Trace blunting of right costophrenic angle. No pneumothorax. Heart/mediastinum: Right heart border is obscured. Bones: No acute osseous abnormality. IMPRESSION: 1. Right internal jugular venous catheter tip projects over the main pulmonary artery trunk. 2. Right upper extremity PICC tip projects over the superior cavoatrial junction. 3. Diffuse bilateral interstitial opacities, likely pulmonary edema. 4. Trace blunting of right costophrenic angle, which may represent a trace pleural effusion. Electronically Signed   By: Limin  Xu M.D.   On: 07/08/2023 11:10    Cardiac Studies   reviewed  Patient Profile     73 y.o. female admitted with staph sepsis and found to have sinus node dysfunction, requiring pressors, now off with the need for back up pacing.   Assessment & Plan    Sinus node dysfunction - she has had junctional rhythm. She feels better V paced at 70/min. I suspect she would be even better with atrial pacing. Unfortunately not a candidate for PPM at this time. I suspect that she will require a temp perm atrial pacemaker next week, with a perm PPM in 4-6 weeks.  Staph sepsis - note plans for TEE next week.   For questions or updates, please contact [facility] Please consult [email or web address] for contact info under Cardiology/STEMI    Signed, Danelle Birmingham, MD  07/08/2023, 12:57 PM

## 2023-07-09 DIAGNOSIS — R570 Cardiogenic shock: Secondary | ICD-10-CM | POA: Diagnosis not present

## 2023-07-09 LAB — COOXEMETRY PANEL
Carboxyhemoglobin: 1.4 % (ref 0.5–1.5)
Carboxyhemoglobin: 1.4 % (ref 0.5–1.5)
Methemoglobin: <0.7 % (ref 0.0–1.5)
Methemoglobin: <0.7 % (ref 0.0–1.5)
O2 Saturation: 51.7 %
O2 Saturation: 72.7 %
Total hemoglobin: 10 g/dL — ABNORMAL LOW (ref 12.0–16.0)
Total hemoglobin: 9.1 g/dL — ABNORMAL LOW (ref 12.0–16.0)

## 2023-07-09 LAB — BASIC METABOLIC PANEL WITH GFR
Anion gap: 5 (ref 5–15)
BUN: 9 mg/dL (ref 8–23)
CO2: 29 mmol/L (ref 22–32)
Calcium: 7.7 mg/dL — ABNORMAL LOW (ref 8.9–10.3)
Chloride: 94 mmol/L — ABNORMAL LOW (ref 98–111)
Creatinine, Ser: 0.61 mg/dL (ref 0.44–1.00)
GFR, Estimated: >60 mL/min (ref >60)
Glucose, Bld: 120 mg/dL — ABNORMAL HIGH (ref 70–99)
Potassium: 4.1 mmol/L (ref 3.5–5.1)
Sodium: 128 mmol/L — ABNORMAL LOW (ref 135–145)

## 2023-07-09 LAB — CBC
HCT: 28.3 % — ABNORMAL LOW (ref 36.0–46.0)
Hemoglobin: 9.2 g/dL — ABNORMAL LOW (ref 12.0–15.0)
MCH: 31.3 pg (ref 26.0–34.0)
MCHC: 32.5 g/dL (ref 30.0–36.0)
MCV: 96.3 fL (ref 80.0–100.0)
Platelets: 211 10*3/uL (ref 150–400)
RBC: 2.94 MIL/uL — ABNORMAL LOW (ref 3.87–5.11)
RDW: 13.7 % (ref 11.5–15.5)
WBC: 8.8 10*3/uL (ref 4.0–10.5)
nRBC: 0 % (ref 0.0–0.2)

## 2023-07-09 LAB — HEPARIN LEVEL (UNFRACTIONATED): Heparin Unfractionated: 0.52 [IU]/mL (ref 0.30–0.70)

## 2023-07-09 LAB — MAGNESIUM: Magnesium: 2.2 mg/dL (ref 1.7–2.4)

## 2023-07-09 MED ORDER — SODIUM CHLORIDE 0.9 % IV SOLN: INTRAVENOUS | Status: AC | PRN

## 2023-07-09 NOTE — Progress Notes (Signed)
 ANTICOAGULATION CONSULT NOTE  Pharmacy Consult for heparin >> bivalirudin  >>heparin  Indication: Impella RP >Afib  Allergies  Allergen Reactions   Codeine Other (See Comments)    headache   Crestor [Rosuvastatin] Other (See Comments)    Muscle aches   Other Nausea And Vomiting    Anesthesia-severe vomiting   Simvastatin Other (See Comments)    Muscle aches   Egg White (Egg Protein)     Other Reaction(s): Throat closes   Ezetimibe      Other Reaction(s): crippling muscle aches   Hydrocodone-Acetaminophen      Other Reaction(s): migraines   Peanut (Diagnostic)     Other Reaction(s): itchy   Shellfish Allergy Other (See Comments)    Throat swelling   Statins     Patient Measurements: Height: 5' 3 (160 cm) Weight: 75.1 kg (165 lb 9.1 oz) IBW/kg (Calculated) : 52.4 Heparin  Dosing Weight: 68 kg   Vital Signs: Temp: 97.7 F (36.5 C) (06/22 0800) Temp Source: Oral (06/22 0800) BP: 98/65 (06/22 0800) Pulse Rate: 70 (06/22 0800)  Labs: Recent Labs    07/07/23 0500 07/08/23 0525 07/08/23 1007 07/09/23 0650  HGB 9.4* 8.5*  --  9.2*  HCT 27.5* 25.7*  --  28.3*  PLT 179 197  --  211  HEPARINUNFRC 0.49 0.35  --  0.52  CREATININE 0.70 1.09* 0.67 0.61    Estimated Creatinine Clearance: 61.7 mL/min (by C-G formula based on SCr of 0.61 mg/dL).   Assessment: 73 yo female in cardiogenic shock with RV failure s/p Impella RP 6/10.  Not on anticoagulation prior to admission.  Pharmacy consulted for heparin  dosing.  Chest CT w/ Type B dissection (small ulcerated plaque/developing intramural hematoma ~8 mm x 14 mm).   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  Discussed with team since HIT negative will change to heparin  infusion 6/17.   Heparin  level is therapeutic at 0.52, on 1250 units/hr. Hgb 9.2, plt 211. No s/sx of bleeding or infusion issues.   Goal of Therapy:  Heparin  level 0.3-0.7 units/ml Monitor platelets by anticoagulation protocol: Yes   Plan:  Continue heparin  IV 1250  units/hr  Monitor daily HL, CBC, and for s/sx of bleeding   Thank you for allowing pharmacy to participate in this patient's care,  Suzen Sour, PharmD, BCCCP Clinical Pharmacist  Phone: [phone] 07/09/2023 8:38 AM  Please check AMION for all [facility] Pharmacy phone numbers After 10:00 PM, call Main Pharmacy [phone]

## 2023-07-09 NOTE — Progress Notes (Addendum)
 0800 Patient orientated on room air able to make needs known on heparin  gtt Temporary pacer running as ordered CVP pressure valve removed then level and zeroed. Explained temp pacer with patient and her husband with patient concerns for infection.Sheath measured at 3cm from insertion to 40cm marks

## 2023-07-09 NOTE — Plan of Care (Signed)
  Problem: Education: Goal: Knowledge of General Education information will improve Description: Including pain rating scale, medication(s)/side effects and non-pharmacologic comfort measures Outcome: Progressing   Problem: Health Behavior/Discharge Planning: Goal: Ability to manage health-related needs will improve Outcome: Progressing   Problem: Clinical Measurements: Goal: Ability to maintain clinical measurements within normal limits will improve Outcome: Progressing Goal: Will remain free from infection Outcome: Progressing Goal: Diagnostic test results will improve Outcome: Progressing Goal: Respiratory complications will improve Outcome: Not Progressing Goal: Cardiovascular complication will be avoided Outcome: Not Progressing   Problem: Activity: Goal: Risk for activity intolerance will decrease Outcome: Not Progressing   Problem: Nutrition: Goal: Adequate nutrition will be maintained Outcome: Progressing   Problem: Coping: Goal: Level of anxiety will decrease Outcome: Progressing   Problem: Elimination: Goal: Will not experience complications related to bowel motility Outcome: Progressing Goal: Will not experience complications related to urinary retention Outcome: Progressing   Problem: Pain Managment: Goal: General experience of comfort will improve and/or be controlled Outcome: Progressing   Problem: Safety: Goal: Ability to remain free from injury will improve Outcome: Not Progressing   Problem: Skin Integrity: Goal: Risk for impaired skin integrity will decrease Outcome: Progressing   Problem: Cardiac: Goal: Ability to achieve and maintain adequate cardiopulmonary perfusion will improve Outcome: Not Progressing Goal: Vascular access site(s) Level 0-1 will be maintained Outcome: Progressing   Problem: Fluid Volume: Goal: Ability to achieve a balanced intake and output will improve Outcome: Progressing   Problem: Physical Regulation: Goal:  Complications related to the disease process, condition or treatment will be avoided or minimized Outcome: Progressing   Problem: Respiratory: Goal: Will regain and/or maintain adequate ventilation Outcome: Not Progressing   Problem: Education: Goal: Ability to describe self-care measures that may prevent or decrease complications (Diabetes Survival Skills Education) will improve Outcome: Not Progressing Goal: Individualized Educational Video(s) Outcome: Not Progressing   Problem: Coping: Goal: Ability to adjust to condition or change in health will improve Outcome: Progressing   Problem: Fluid Volume: Goal: Ability to maintain a balanced intake and output will improve Outcome: Progressing   Problem: Health Behavior/Discharge Planning: Goal: Ability to identify and utilize available resources and services will improve Outcome: Not Progressing   Problem: Health Behavior/Discharge Planning: Goal: Ability to manage health-related needs will improve Outcome: Not Progressing   Problem: Metabolic: Goal: Ability to maintain appropriate glucose levels will improve Outcome: Progressing   Problem: Nutritional: Goal: Maintenance of adequate nutrition will improve Outcome: Progressing Goal: Progress toward achieving an optimal weight will improve Outcome: Not Progressing   Problem: Skin Integrity: Goal: Risk for impaired skin integrity will decrease Outcome: Progressing   Problem: Tissue Perfusion: Goal: Adequacy of tissue perfusion will improve Outcome: Progressing   Problem: Education: Goal: Understanding of CV disease, CV risk reduction, and recovery process will improve Outcome: Not Progressing Goal: Individualized Educational Video(s) Outcome: Not Progressing   Problem: Activity: Goal: Ability to return to baseline activity level will improve Outcome: Not Progressing   Problem: Cardiovascular: Goal: Ability to achieve and maintain adequate cardiovascular perfusion  will improve Outcome: Not Progressing Goal: Vascular access site(s) Level 0-1 will be maintained Outcome: Progressing   Problem: Health Behavior/Discharge Planning: Goal: Ability to safely manage health-related needs after discharge will improve Outcome: Not Progressing

## 2023-07-09 NOTE — Progress Notes (Signed)
 Progress Note  Patient Name: Vicki [name] Date of Encounter: 07/09/2023  Primary Cardiologist: None   Subjective   Feels better. Dyspnea improved.   Inpatient Medications    Scheduled Meds:  aspirin  EC  81 mg Oral Daily   Chlorhexidine  Gluconate Cloth  6 each Topical Daily   feeding supplement  237 mL Oral BID BM   levothyroxine   50 mcg Oral Daily   lidocaine   1 patch Transdermal Q24H   multivitamin with minerals  1 tablet Oral Daily   pantoprazole   40 mg Oral Daily   polyethylene glycol  17 g Oral BID   senna  1 tablet Oral QHS   sertraline   100 mg Oral Daily   sodium chloride  flush  10-40 mL Intracatheter Q12H   sodium chloride  flush  3 mL Intravenous Q12H   torsemide   20 mg Oral Daily   Continuous Infusions:   ceFAZolin  (ANCEF ) IV Stopped (07/09/23 0522)   heparin  1,250 Units/hr (07/09/23 1000)   PRN Meds: sodium chloride , acetaminophen , lip balm, ondansetron  (ZOFRAN ) IV, mouth rinse, oxyCODONE , sodium chloride  flush, sodium chloride  flush   Vital Signs    Vitals:   07/09/23 0700 07/09/23 0800 07/09/23 0900 07/09/23 1000  BP: 114/81 98/65 (!) 119/48 120/74  Pulse: 74 70 74 67  Resp: 20 15 19 19   Temp:  97.7 F (36.5 C)    TempSrc:  Oral    SpO2: 97% 97% 97% 97%  Weight:      Height:        Intake/Output Summary (Last 24 hours) at 07/09/2023 1044 Last data filed at 07/09/2023 1000 Gross per 24 hour  Intake 1090.02 ml  Output 1700 ml  Net -609.98 ml   Filed Weights   07/07/23 0447 07/08/23 0500 07/09/23 0318  Weight: 78.4 kg 75.1 kg 75.1 kg    Telemetry    Sinus brady with ventricular pacing - Personally Reviewed  ECG    none - Personally Reviewed  Physical Exam   GEN: No acute distress.   Neck: No JVD; indwelling right internal jugular temp pacer Cardiac: IRRR, no murmurs, rubs, or gallops.  Respiratory: Clear to auscultation bilaterally. GI: Soft, nontender, non-distended  MS: No edema; No deformity. Neuro:  Nonfocal  Psych: Normal  affect   Labs    Chemistry Recent Labs  Lab 07/04/23 0500 07/04/23 1402 07/08/23 0525 07/08/23 1007 07/09/23 0650  NA  --    < > 121* 129* 128*  K  --    < > 3.7 4.4 4.1  CL  --    < > 81* 94* 94*  CO2  --    < > 23 30 29   GLUCOSE  --    < > 533* 158* 120*  BUN  --    < > 8 10 9   CREATININE  --    < > 1.09* 0.67 0.61  CALCIUM   --    < > 6.6* 8.3* 7.7*  PROT 5.4*  --   --   --   --   ALBUMIN 2.4*  --   --   --   --   AST 27  --   --   --   --   ALT 35  --   --   --   --   ALKPHOS 74  --   --   --   --   BILITOT 0.8  --   --   --   --   GFRNONAA  --    < >  54* >60 >60  ANIONGAP  --    < > 17* 5 5   < > = values in this interval not displayed.     Hematology Recent Labs  Lab 07/07/23 0500 07/08/23 0525 07/09/23 0650  WBC 10.7* 8.9 8.8  RBC 2.96* 2.69* 2.94*  HGB 9.4* 8.5* 9.2*  HCT 27.5* 25.7* 28.3*  MCV 92.9 95.5 96.3  MCH 31.8 31.6 31.3  MCHC 34.2 33.1 32.5  RDW 13.2 13.7 13.7  PLT 179 197 211    Cardiac EnzymesNo results for input(s): TROPONINI in the last 168 hours. No results for input(s): TROPIPOC in the last 168 hours.   BNPNo results for input(s): BNP, PROBNP in the last 168 hours.   DDimer No results for input(s): DDIMER in the last 168 hours.   Radiology    DG CHEST PORT 1 VIEW Result Date: 07/08/2023 CLINICAL DATA:  Migration of central venous catheter chest EXAM: PORTABLE CHEST 1 VIEW COMPARISON:  Chest radiograph dated 07/06/2023 FINDINGS: Lines/tubes: Right internal jugular venous catheter tip projects over the main pulmonary artery trunk. Right upper extremity PICC tip projects over the superior cavoatrial junction. Previously noted left-sided catheter is no longer seen. Lungs: Unchanged asymmetric elevation of the right hemidiaphragm with low lung volumes with bronchovascular crowding. Diffuse bilateral interstitial opacities. Pleura: Trace blunting of right costophrenic angle. No pneumothorax. Heart/mediastinum: Right heart border is  obscured. Bones: No acute osseous abnormality. IMPRESSION: 1. Right internal jugular venous catheter tip projects over the main pulmonary artery trunk. 2. Right upper extremity PICC tip projects over the superior cavoatrial junction. 3. Diffuse bilateral interstitial opacities, likely pulmonary edema. 4. Trace blunting of right costophrenic angle, which may represent a trace pleural effusion. Electronically Signed   By: Limin  Xu M.D.   On: 07/08/2023 11:10    Cardiac Studies   See above.   Patient Profile     73 y.o. female admitted with staph sepsis, and sinus node dysfunction who developed profound sinus node dysfunction when her IV pressors were removed.   Assessment & Plan    Sinus node dysfunction - she has been pacing with an occasional sinus beat. Either an temp perm atrial pacer or a leadless atrial pacer would be the next step. This will be in part dependent on her TEE. IF no veg and negative cultures, could consider a leadless atrial pacer. I'll discuss with my partners tomorrow. Staph sepsis - she continues to improve. TEE next week.  For questions or updates, please contact [facility] Please consult [email or web address] for contact info under Cardiology/STEMI.      Signed, Danelle Birmingham, MD  07/09/2023, 10:44 AM

## 2023-07-09 NOTE — Progress Notes (Signed)
 Advanced Heart Failure Rounding Note  HF Cardiologist: Rolan Fuel, MD Chief Complaint: Cardiogenic Shock Subjective:   Off dobutamine  currently. Remains in sinus brady with rates in the 30-40s, v paced at 70. Tentative plan for TEE on 6/23. If clear, could consider atrial leadless pacemaker versus temp perm. Atrial leadless may be preferable as she desperately needs rehab.     Objective:    Vital Signs:   Temp:  [97.7 F (36.5 C)-98.3 F (36.8 C)] 97.7 F (36.5 C) (06/22 1158) Pulse Rate:  [62-80] 69 (06/22 1400) Resp:  [10-26] 18 (06/22 1400) BP: (83-141)/(48-89) 116/61 (06/22 1400) SpO2:  [95 %-99 %] 96 % (06/22 1400) Weight:  [75.1 kg] 75.1 kg (06/22 0318) Last BM Date : 07/09/23  Weight change: Filed Weights   07/07/23 0447 07/08/23 0500 07/09/23 0318  Weight: 78.4 kg 75.1 kg 75.1 kg   Intake/Output:  Intake/Output Summary (Last 24 hours) at 07/09/2023 1457 Last data filed at 07/09/2023 1400 Gross per 24 hour  Intake 1330.04 ml  Output 2400 ml  Net -1069.96 ml  CVP 6-7  General:   No resp difficulty + TVP , weak Neck: supple. no JVD.  Cor: Paced rhythm, no m/g/r Lungs: clear Abdomen: soft, nontender, nondistended.    Telemetry:  Paced at 70, underlying rates in the 40s  Labs: Basic Metabolic Panel: Recent Labs  Lab 07/05/23 0427 07/05/23 0730 07/06/23 0430 07/06/23 1615 07/07/23 0500 07/08/23 0525 07/08/23 1007 07/09/23 0650  NA 120*   < > 121* 125* 125* 121* 129* 128*  K 6.1*   < > 3.7 3.9 3.6 3.7 4.4 4.1  CL 83*   < > 84* 84* 85* 81* 94* 94*  CO2 25   < > 27 29 29 23 30 29   GLUCOSE 210*   < > 123* 94 118* 533* 158* 120*  BUN 15   < > 20 18 15 8 10 9   CREATININE 0.96   < > 0.82 0.79 0.70 1.09* 0.67 0.61  CALCIUM  8.1*   < > 8.1* 8.3* 8.4* 6.6* 8.3* 7.7*  MG 2.3  --  2.0  --  1.8 1.8  --  2.2   < > = values in this interval not displayed.   Liver Function Tests: Recent Labs  Lab 07/04/23 0500  AST 27  ALT 35  ALKPHOS 74  BILITOT 0.8   PROT 5.4*  ALBUMIN 2.4*   CBC: Recent Labs  Lab 07/05/23 0427 07/06/23 0430 07/07/23 0500 07/08/23 0525 07/09/23 0650  WBC 15.4* 12.6* 10.7* 8.9 8.8  HGB 10.3* 9.3* 9.4* 8.5* 9.2*  HCT 30.1* 26.8* 27.5* 25.7* 28.3*  MCV 92.9 91.8 92.9 95.5 96.3  PLT 142* 149* 179 197 211   BNP (last 3 results) Recent Labs    06/27/23 1020  BNP 543.0*   Medications:    Scheduled Medications:  aspirin  EC  81 mg Oral Daily   Chlorhexidine  Gluconate Cloth  6 each Topical Daily   feeding supplement  237 mL Oral BID BM   levothyroxine   50 mcg Oral Daily   lidocaine   1 patch Transdermal Q24H   multivitamin with minerals  1 tablet Oral Daily   pantoprazole   40 mg Oral Daily   polyethylene glycol  17 g Oral BID   senna  1 tablet Oral QHS   sertraline   100 mg Oral Daily   sodium chloride  flush  10-40 mL Intracatheter Q12H   sodium chloride  flush  3 mL Intravenous Q12H   torsemide   20  mg Oral Daily   Infusions:   ceFAZolin  (ANCEF ) IV 2 g (07/09/23 1415)   heparin  1,250 Units/hr (07/09/23 1417)   PRN Medications: sodium chloride , acetaminophen , lip balm, ondansetron  (ZOFRAN ) IV, mouth rinse, oxyCODONE , sodium chloride  flush, sodium chloride  flush  Assessment/Plan:   Cardiogenic Shock, 2/2 Profound RV Failure --> Resolved - cath 6/10 normal left-dominant system with non-visualized tiny RCA - Echo LVEF 60-65% RV moderate HK with McConnell's sign - RHC 6/10 (on NE 15): RA 18, PA 35/21 (26), PCWP 19, CO/CI (TD) 2.9/1.6, PVR 0.8, PAPi 0.78 - CT w/o PE - s/p Impella RP placement. Removed 6/15. - POCUS ECHO 6/12 w/ improvement LVEF 60% RV mild HK  - 6/19 Echo No evidence of endocarditis. RV normal. LV 60-65%.  - Stopped dobutamine  on 6/20, requiring ongoing pacing - Coox 72 and stable - Continue PICC line - Continue torsemide  20mg  daily  Acute diastolic HF - plan as above  NSTEMI with RV infarct: Resolved  - plan as above - continue ASA - no b-blocker with shock  Tachy-brady  Syndrome - Previously in/out AF in 120-130s and acclerated junctional in 40s. - Now in sinus bradycardia, rates in the 30-40s. Feels better paced - TEE on Monday, ideally at bedside. 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 Na 129 - restrict FW  Thrombocytopenia - heparin  switched to bival on 6/13. PLTs stabilized/improved off heparin  - SRA negative - continue heparin  gtt - PLT within normal range.   Bacteremia: Line related source - Lines removed. Bcx 6/17 grew MSSE.  - WBC down 10.7  - On Ancef , minimum 2 weeks, duration dependent on TEE - Formal echo to eval heart valves, no evidence of endocarditis.  - TEE Monday - ID following; appreciate recs -  6/19 blood cultures- NGTD   Hyperkalemia: resolved Shock Liver: resolved AKI: resolved  Length of Stay: 12    Morene JINNY Brownie, MD 07/09/2023, 2:57 PM  Advanced Heart Failure Team Pager [phone] (M-F; 7a - 5p)

## 2023-07-09 NOTE — Plan of Care (Signed)
 The patient remains in [facility] as of time of writing. The patient is AA+Ox4; however, forgetful and a bit anxious overnight. Supplemental O2 at 2 L / min via [place]. Upon change of shift, and throughout my shift, the patient does not have a pattern of perfect ventricular capture but is maintaining a HR of ~ 70 bpm with adequate BP, with frequent native beats visible. TVP to RIJ via sheath; external length of sheath is measured at 43 cm; settings: 70 bpm, 13 mA, sensitivity of 1 in VVI mode. NPO at [facility] for TEE tomorrow (tentatively). The patient is expressing GI distress overnight: bloating, cramping, nausea, and diarrhea; laxative and stool softener are held overnight. Bedpan for voiding. The patient' spouse remains at bedside overnight. The patient remains on an active infusion of Heparin  IV. PICC also remains in place to RUE with CVP monitoring in place.   Problem: Education: Goal: Knowledge of General Education information will improve Description: Including pain rating scale, medication(s)/side effects and non-pharmacologic comfort measures Outcome: Progressing   Problem: Health Behavior/Discharge Planning: Goal: Ability to manage health-related needs will improve Outcome: Progressing   Problem: Clinical Measurements: Goal: Ability to maintain clinical measurements within normal limits will improve Outcome: Progressing Goal: Will remain free from infection Outcome: Progressing Goal: Diagnostic test results will improve Outcome: Progressing Goal: Respiratory complications will improve Outcome: Progressing Goal: Cardiovascular complication will be avoided Outcome: Progressing   Problem: Activity: Goal: Risk for activity intolerance will decrease Outcome: Progressing   Problem: Nutrition: Goal: Adequate nutrition will be maintained Outcome: Progressing   Problem: Coping: Goal: Level of anxiety will decrease Outcome: Progressing   Problem: Elimination: Goal: Will not experience  complications related to bowel motility Outcome: Progressing Goal: Will not experience complications related to urinary retention Outcome: Progressing   Problem: Pain Managment: Goal: General experience of comfort will improve and/or be controlled Outcome: Progressing   Problem: Safety: Goal: Ability to remain free from injury will improve Outcome: Progressing   Problem: Skin Integrity: Goal: Risk for impaired skin integrity will decrease Outcome: Progressing   Problem: Cardiac: Goal: Ability to achieve and maintain adequate cardiopulmonary perfusion will improve Outcome: Progressing Goal: Vascular access site(s) Level 0-1 will be maintained Outcome: Progressing   Problem: Fluid Volume: Goal: Ability to achieve a balanced intake and output will improve Outcome: Progressing   Problem: Physical Regulation: Goal: Complications related to the disease process, condition or treatment will be avoided or minimized Outcome: Progressing   Problem: Respiratory: Goal: Will regain and/or maintain adequate ventilation Outcome: Progressing   Problem: Education: Goal: Ability to describe self-care measures that may prevent or decrease complications (Diabetes Survival Skills Education) will improve Outcome: Progressing Goal: Individualized Educational Video(s) Outcome: Progressing   Problem: Coping: Goal: Ability to adjust to condition or change in health will improve Outcome: Progressing   Problem: Fluid Volume: Goal: Ability to maintain a balanced intake and output will improve Outcome: Progressing   Problem: Health Behavior/Discharge Planning: Goal: Ability to identify and utilize available resources and services will improve Outcome: Progressing Goal: Ability to manage health-related needs will improve Outcome: Progressing   Problem: Metabolic: Goal: Ability to maintain appropriate glucose levels will improve Outcome: Progressing   Problem: Nutritional: Goal:  Maintenance of adequate nutrition will improve Outcome: Progressing Goal: Progress toward achieving an optimal weight will improve Outcome: Progressing   Problem: Skin Integrity: Goal: Risk for impaired skin integrity will decrease Outcome: Progressing   Problem: Tissue Perfusion: Goal: Adequacy of tissue perfusion will improve Outcome: Progressing   Problem: Education:  Goal: Understanding of CV disease, CV risk reduction, and recovery process will improve Outcome: Progressing Goal: Individualized Educational Video(s) Outcome: Progressing   Problem: Activity: Goal: Ability to return to baseline activity level will improve Outcome: Progressing   Problem: Cardiovascular: Goal: Ability to achieve and maintain adequate cardiovascular perfusion will improve Outcome: Progressing Goal: Vascular access site(s) Level 0-1 will be maintained Outcome: Progressing

## 2023-07-10 ENCOUNTER — Inpatient Hospital Stay (HOSPITAL_COMMUNITY)

## 2023-07-10 DIAGNOSIS — R001 Bradycardia, unspecified: Secondary | ICD-10-CM

## 2023-07-10 DIAGNOSIS — R570 Cardiogenic shock: Secondary | ICD-10-CM | POA: Diagnosis not present

## 2023-07-10 DIAGNOSIS — I5040 Unspecified combined systolic (congestive) and diastolic (congestive) heart failure: Secondary | ICD-10-CM

## 2023-07-10 LAB — BASIC METABOLIC PANEL WITH GFR
Anion gap: 8 (ref 5–15)
BUN: 8 mg/dL (ref 8–23)
CO2: 28 mmol/L (ref 22–32)
Calcium: 8.3 mg/dL — ABNORMAL LOW (ref 8.9–10.3)
Chloride: 96 mmol/L — ABNORMAL LOW (ref 98–111)
Creatinine, Ser: 0.52 mg/dL (ref 0.44–1.00)
GFR, Estimated: >60 mL/min (ref >60)
Glucose, Bld: 107 mg/dL — ABNORMAL HIGH (ref 70–99)
Potassium: 3.9 mmol/L (ref 3.5–5.1)
Sodium: 132 mmol/L — ABNORMAL LOW (ref 135–145)

## 2023-07-10 LAB — CBC
HCT: 28.9 % — ABNORMAL LOW (ref 36.0–46.0)
Hemoglobin: 9.3 g/dL — ABNORMAL LOW (ref 12.0–15.0)
MCH: 31.3 pg (ref 26.0–34.0)
MCHC: 32.2 g/dL (ref 30.0–36.0)
MCV: 97.3 fL (ref 80.0–100.0)
Platelets: 237 10*3/uL (ref 150–400)
RBC: 2.97 MIL/uL — ABNORMAL LOW (ref 3.87–5.11)
RDW: 13.7 % (ref 11.5–15.5)
WBC: 8.9 10*3/uL (ref 4.0–10.5)
nRBC: 0 % (ref 0.0–0.2)

## 2023-07-10 LAB — HEPARIN LEVEL (UNFRACTIONATED): Heparin Unfractionated: 0.66 [IU]/mL (ref 0.30–0.70)

## 2023-07-10 LAB — COOXEMETRY PANEL
Carboxyhemoglobin: 2.3 % — ABNORMAL HIGH (ref 0.5–1.5)
Methemoglobin: <0.7 % (ref 0.0–1.5)
O2 Saturation: 70 %
Total hemoglobin: 9.7 g/dL — ABNORMAL LOW (ref 12.0–16.0)

## 2023-07-10 LAB — MAGNESIUM: Magnesium: 1.9 mg/dL (ref 1.7–2.4)

## 2023-07-10 MED ORDER — PROPOFOL 10 MG/ML IV BOLUS: 60.0000 mg | Freq: Once | INTRAVENOUS | Status: AC

## 2023-07-10 MED ORDER — SODIUM CHLORIDE 0.9% FLUSH: 3.0000 mL | INTRAVENOUS | Status: DC | PRN

## 2023-07-10 MED ORDER — FENTANYL CITRATE PF 50 MCG/ML IJ SOSY
PREFILLED_SYRINGE | INTRAMUSCULAR | Status: AC
  Administered 2023-07-10: 50 ug via INTRAVENOUS
  Filled 2023-07-10: qty 2

## 2023-07-10 MED ORDER — KETAMINE HCL 50 MG/5ML IJ SOSY: 1.0000 mg/kg | PREFILLED_SYRINGE | Freq: Once | INTRAMUSCULAR | Status: DC

## 2023-07-10 MED ORDER — FENTANYL CITRATE PF 50 MCG/ML IJ SOSY: 50.0000 ug | PREFILLED_SYRINGE | Freq: Once | INTRAMUSCULAR | Status: AC

## 2023-07-10 MED ORDER — POTASSIUM CHLORIDE CRYS ER 20 MEQ PO TBCR
20.0000 meq | EXTENDED_RELEASE_TABLET | Freq: Once | ORAL | Status: AC
  Administered 2023-07-10: 20 meq via ORAL
  Filled 2023-07-10: qty 1

## 2023-07-10 MED ORDER — SODIUM CHLORIDE 0.9% FLUSH
3.0000 mL | Freq: Two times a day (BID) | INTRAVENOUS | Status: DC
  Administered 2023-07-10: 3 mL via INTRAVENOUS
  Administered 2023-07-10: 10 mL via INTRAVENOUS
  Administered 2023-07-12: 3 mL via INTRAVENOUS

## 2023-07-10 MED ORDER — MAGNESIUM SULFATE 2 GM/50ML IV SOLN
2.0000 g | Freq: Once | INTRAVENOUS | Status: AC
  Administered 2023-07-10: 2 g via INTRAVENOUS
  Filled 2023-07-10: qty 50

## 2023-07-10 MED ORDER — MIDAZOLAM HCL 2 MG/2ML IJ SOLN: 2.0000 mg | Freq: Once | INTRAMUSCULAR | Status: DC

## 2023-07-10 MED ORDER — PROPOFOL 10 MG/ML IV BOLUS: 100.0000 mg | Freq: Once | INTRAVENOUS | Status: DC

## 2023-07-10 MED ORDER — PROPOFOL 10 MG/ML IV BOLUS
INTRAVENOUS | Status: AC
  Administered 2023-07-10: 60 mg via INTRAVENOUS
  Filled 2023-07-10: qty 20

## 2023-07-10 NOTE — Progress Notes (Signed)
 ANTICOAGULATION CONSULT NOTE  Pharmacy Consult for heparin >> bivalirudin  >>heparin  Indication: Impella RP >Afib  Allergies  Allergen Reactions   Codeine Other (See Comments)    headache   Crestor [Rosuvastatin] Other (See Comments)    Muscle aches   Other Nausea And Vomiting    Anesthesia-severe vomiting   Simvastatin Other (See Comments)    Muscle aches   Egg White (Egg Protein)     Other Reaction(s): Throat closes   Ezetimibe      Other Reaction(s): crippling muscle aches   Hydrocodone-Acetaminophen      Other Reaction(s): migraines   Peanut (Diagnostic)     Other Reaction(s): itchy   Shellfish Allergy Other (See Comments)    Throat swelling   Statins     Patient Measurements: Height: 5' 3 (160 cm) Weight: 75.2 kg (165 lb 12.6 oz) IBW/kg (Calculated) : 52.4 Heparin  Dosing Weight: 68 kg   Vital Signs: Temp: 97.6 F (36.4 C) (06/23 0600) Temp Source: Oral (06/23 0600) BP: 149/82 (06/23 0700) Pulse Rate: 77 (06/23 0700)  Labs: Recent Labs    07/08/23 0525 07/08/23 1007 07/09/23 0650 07/10/23 0504  HGB 8.5*  --  9.2* 9.3*  HCT 25.7*  --  28.3* 28.9*  PLT 197  --  211 237  HEPARINUNFRC 0.35  --  0.52 0.66  CREATININE 1.09* 0.67 0.61 0.52    Estimated Creatinine Clearance: 61.7 mL/min (by C-G formula based on SCr of 0.52 mg/dL).   Assessment: 73 yo female in cardiogenic shock with RV failure s/p Impella RP 6/10.  Not on anticoagulation prior to admission.  Pharmacy consulted for heparin  dosing.  Chest CT w/ Type B dissection (small ulcerated plaque/developing intramural hematoma ~8 mm x 14 mm).   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  Discussed with team since HIT negative will change to heparin  infusion 6/17.   Heparin  level is therapeutic at 0.66, on 1250 units/hr. Hgb 9.3, plt 237. No s/sx of bleeding or infusion issues.   Goal of Therapy:  Heparin  level 0.3-0.7 units/ml Monitor platelets by anticoagulation protocol: Yes   Plan:  Continue heparin  IV 1250  units/hr  Monitor daily HL, CBC, and for s/sx of bleeding   Thank you for allowing pharmacy to participate in this patient's care,  Maurilio Fila, PharmD Clinical Pharmacist 07/10/2023  7:09 AM

## 2023-07-10 NOTE — Progress Notes (Signed)
 Patient's HR/rhythm changed from 60-70s NSR to 40-50s junctional after transferring to chair. Patient developed lightheadedness and nausea that worsened with time and with transferring to/from [facility]. After patient returned to bed, HR returned to 60-70s NSR and symptoms resolved. BP stable throughout.

## 2023-07-10 NOTE — Progress Notes (Addendum)
 Patient Name: Vicki [name] Date of Encounter: 07/10/2023  Primary Cardiologist: None Electrophysiologist: None  Interval Summary   Pt reports tape on her neck is her only issue.  Husband at bedside.  Afebrile on abx, WBC defervesced, repeat BC pending from 6/19  Vital Signs    Vitals:   07/10/23 0500 07/10/23 0600 07/10/23 0700 07/10/23 0808  BP: 124/85 136/78 (!) 149/82   Pulse: 69 68 77   Resp: 10 18 14    Temp:  97.6 F (36.4 C)  97.9 F (36.6 C)  TempSrc:  Oral  Oral  SpO2: 98% 97% 98%   Weight: 75.2 kg     Height:        Intake/Output Summary (Last 24 hours) at 07/10/2023 0809 Last data filed at 07/10/2023 0700 Gross per 24 hour  Intake 984.75 ml  Output 1450 ml  Net -465.25 ml   Filed Weights   07/08/23 0500 07/09/23 0318 07/10/23 0500  Weight: 75.1 kg 75.1 kg 75.2 kg    Physical Exam    GEN- adult female lying in bed in NAD, alert and oriented x 3 today.   Lungs- Clear to ausculation bilaterally, normal work of breathing Cardiac- Regular rate and rhythm with occ pause, no murmurs, rubs or gallops GI- soft, NT, ND, + BS Extremities- no clubbing or cyanosis. No edema  Telemetry    SR with intermittent VP 60-70's, occ LOC for 1-2 beats (personally reviewed)  Hospital Course    Vicki [name] is a 73 y.o. female with PMH of HTN, HLD, tobacco abuse admitted 06/27/23 for malaise, weakness & chest pain in setting of NSTEMI and RV failure. Had new AF on 06/25/23 at [facility] but refused ER transfer. Emergent R/LHC with no significant CAD or culprit lesion, but unable to angiographically identify RCA (seen as small on RHC 2017), severe RV failure s/p Impella.  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 Impella removed 07/02/23, continued to require vasopressors.  Shock liver, AKI improved.  ON 6/17 noted to have fever to 102, leukocytosis.  BCx4  positive for staph EPI. ID following, on abx. Continued to have intermittent bradycardia despite vasopressors, temp wire placed 07/05/23 in RIJ.    Assessment & Plan    SND with possible Sinus Arrest  Junctional Rhythm  Tachy-Brady  -TVP in place in R IJ -TVP programmed at VVI 70, 13 mA, intermittent SR mixed with VP, LOC 2mA  -given current infection concerns, placing permanent pacing device very high risk infection  -pending TEE to rule out endocarditis, if negative, could consider leadless PPM -consider temp perm as a bridge to decision / recovery could be reasonable option that would allow for rehab efforts in the short term  -NPO for now   Acute RV Failure Acute Diastolic HF  NSTEMI with RV Infarct  -per primary   Paroxysmal AF  -heparin  gtt per pharmacy   Staph EPI Bacteremia  -previously reviewed with ID> native valve endocarditis from staph epi is uncommon > rec's for TEE, pending  -early transvenous device would carry significant infection risk   Aortic Ulceration  By CTA 6/10, irregular atheromatous plaque along the lateral wall of the mid and distal descending thoracic aorta with a small ulcerated plaque about 8 mm in maximum thickness by 14 mm in maximum AP diameter -per primary      For questions or updates, please contact [facility] Please consult [email or web address] for contact info under     Signed, Daphne Barrack, NP-C, AGACNP-BC [facility] - Electrophysiology  07/10/2023, 8:09 AM  I have seen, examined the patient, and reviewed the above assessment and plan.    Interval:  No acute overnight events. Patient reports feeling relatively well. No new or acute complaints.   General: Well developed, in no acute distress.  Neck: No JVD. RIJ temp wire. Cardiac: Normal rate, regular rhythm.  Resp: Normal work of breathing.  Ext: No edema.  Neuro: No gross focal deficits.  Psych: Normal affect.   Assessment and Plan:  #. Bradycardia: In the  setting of her acute illness patient developed inappropriate bradycardia.  Temporary wire was placed.  After stopping dobutamine  she had increase in temporary usage.  Bradycardia appears to be mainly sinus node dysfunction.  With time it appears that sinus node has recovered.  Today she is conducting one-to-one at normal rates.   - Leave right IJ temporary pacing wire in place for now.  Given evidence of recovered conduction, we have turned right IJ pacer to backup at VVI 40 bpm. Capture threshold remains 2.  Programmed output at 10. - We will evaluate daily.  I have not seen evidence of conduction disease.  No hard indication for permanent pacer implant at this time.  Given concerns for active infectious process, would like to avoid pacer implant if possible.   #. Paroxysmal atrial fibrillation:  - Continue heparin .    #. Staph bacteremia: -Appreciate ID assistance.    Fonda Kitty, MD 07/10/2023 11:06 PM

## 2023-07-10 NOTE — Progress Notes (Signed)
  Echocardiogram Echocardiogram Transesophageal has been performed.  Devora Ellouise SAUNDERS 07/10/2023, 11:59 AM

## 2023-07-10 NOTE — Progress Notes (Signed)
 Advanced Heart Failure Rounding Note  HF Cardiologist: Rolan Fuel, MD Chief Complaint: Cardiogenic Shock Subjective:     Intermittently pacing.    Denies SOB  Objective:    Vital Signs:   Temp:  [97.6 F (36.4 C)-98.4 F (36.9 C)] 97.6 F (36.4 C) (06/23 0600) Pulse Rate:  [63-82] 77 (06/23 0700) Resp:  [10-24] 14 (06/23 0700) BP: (95-149)/(48-118) 149/82 (06/23 0700) SpO2:  [95 %-98 %] 98 % (06/23 0700) Weight:  [75.2 kg] 75.2 kg (06/23 0500) Last BM Date : 07/09/23  Weight change: Filed Weights   07/08/23 0500 07/09/23 0318 07/10/23 0500  Weight: 75.1 kg 75.1 kg 75.2 kg   Intake/Output:  Intake/Output Summary (Last 24 hours) at 07/10/2023 0709 Last data filed at 07/10/2023 0700 Gross per 24 hour  Intake 1097.53 ml  Output 1450 ml  Net -352.47 ml  General:   No resp difficulty. + TVP  Neck: supple. no JVD.  Cor: PMI nondisplaced. Regular rate & rhythm. No rubs, gallops or murmurs. Lungs: clear Abdomen: soft, nontender, nondistended.  Extremities: no cyanosis, clubbing, rash, edema Neuro: alert & oriented x3    Telemetry:  Intermittentlly pacing.  Labs: Basic Metabolic Panel: Recent Labs  Lab 07/06/23 0430 07/06/23 1615 07/07/23 0500 07/08/23 0525 07/08/23 1007 07/09/23 0650 07/10/23 0504  NA 121*   < > 125* 121* 129* 128* 132*  K 3.7   < > 3.6 3.7 4.4 4.1 3.9  CL 84*   < > 85* 81* 94* 94* 96*  CO2 27   < > 29 23 30 29 28   GLUCOSE 123*   < > 118* 533* 158* 120* 107*  BUN 20   < > 15 8 10 9 8   CREATININE 0.82   < > 0.70 1.09* 0.67 0.61 0.52  CALCIUM  8.1*   < > 8.4* 6.6* 8.3* 7.7* 8.3*  MG 2.0  --  1.8 1.8  --  2.2 1.9   < > = values in this interval not displayed.   Liver Function Tests: Recent Labs  Lab 07/04/23 0500  AST 27  ALT 35  ALKPHOS 74  BILITOT 0.8  PROT 5.4*  ALBUMIN 2.4*   CBC: Recent Labs  Lab 07/06/23 0430 07/07/23 0500 07/08/23 0525 07/09/23 0650 07/10/23 0504  WBC 12.6* 10.7* 8.9 8.8 8.9  HGB 9.3* 9.4* 8.5*  9.2* 9.3*  HCT 26.8* 27.5* 25.7* 28.3* 28.9*  MCV 91.8 92.9 95.5 96.3 97.3  PLT 149* 179 197 211 237   BNP (last 3 results) Recent Labs    06/27/23 1020  BNP 543.0*   Medications:    Scheduled Medications:  aspirin  EC  81 mg Oral Daily   Chlorhexidine  Gluconate Cloth  6 each Topical Daily   feeding supplement  237 mL Oral BID BM   levothyroxine   50 mcg Oral Daily   lidocaine   1 patch Transdermal Q24H   multivitamin with minerals  1 tablet Oral Daily   pantoprazole   40 mg Oral Daily   polyethylene glycol  17 g Oral BID   senna  1 tablet Oral QHS   sertraline   100 mg Oral Daily   sodium chloride  flush  10-40 mL Intracatheter Q12H   sodium chloride  flush  3 mL Intravenous Q12H   torsemide   20 mg Oral Daily   Infusions:  sodium chloride  10 mL/hr at 07/10/23 0700    ceFAZolin  (ANCEF ) IV Stopped (07/10/23 0617)   heparin  1,250 Units/hr (07/10/23 0700)   PRN Medications: sodium chloride , sodium chloride , acetaminophen , lip  balm, ondansetron  (ZOFRAN ) IV, mouth rinse, oxyCODONE , sodium chloride  flush, sodium chloride  flush  Assessment/Plan:   Cardiogenic Shock, 2/2 Profound RV Failure --> Resolved - cath 6/10 normal left-dominant system with non-visualized tiny RCA - Echo LVEF 60-65% RV moderate HK with McConnell's sign - RHC 6/10 (on NE 15): RA 18, PA 35/21 (26), PCWP 19, CO/CI (TD) 2.9/1.6, PVR 0.8, PAPi 0.78 - CT w/o PE - s/p Impella RP placement. Removed 6/15. - POCUS ECHO 6/12 w/ improvement LVEF 60% RV mild HK  - 6/19 Echo No evidence of endocarditis. RV normal. LV 60-65%.  - Stopped dobutamine  on 6/20, requiring ongoing pacing - COOX remains stable.  - Continue PICC line - Continue torsemide  20mg  daily  Acute diastolic HF - plan as above  NSTEMI with RV infarct: Resolved  - plan as above - continue ASA - no b-blocker with shock  Tachy-brady Syndrome - Previously in/out AF in 120-130s and acclerated junctional in 40s. - Now in sinus bradycardia, rates in the  30-40s. Feels better paced - TEE today.  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 Na 132  - restrict FW  Thrombocytopenia - heparin  switched to bival on 6/13. PLTs stabilized/improved off heparin  - SRA negative - continue heparin  gtt - PLT within normal range.   Bacteremia: Line related source - Lines removed. Bcx 6/17 grew MSSE.  - WBC 8.9  - On Ancef , minimum 2 weeks, duration dependent on TEE - TEE today  - ID following; appreciate recs -  6/19 blood cultures- NGT -EP following.    Hyperkalemia: resolved Shock Liver: resolved AKI: resolved  TEE today. NPO.   Length of Stay: 13    Greig Mosses, NP 07/10/2023, 7:09 AM  Advanced Heart Failure Team Pager [phone] (M-F; 7a - 5p)

## 2023-07-10 NOTE — Procedures (Signed)
 Procedure  Sedation for TEE  Brief hx 73 yof w/ h/o bacteremia felt 2/2 line infection. Since removed and on ancef . Hosp course c/b SND and sinus arrest still TVP dependent, acute RV failure, acute diastolic HF, and PAF. PCCM asked to assist w/ sedation for TEE.   Exam  BP (!) 159/65   Pulse 63   Temp 97.9 F (36.6 C) (Oral)   Resp 14   Ht 5' 3 (1.6 m)   Wt 75.2 kg   LMP 07/18/2011   SpO2 97%   BMI 29.37 kg/m   General resting in bed no distress HENT NCAT no JVD  Pulm dec bases Card reg irreg Abd soft Ext warm and dry  Neuro intact.   Procedure The patient was already on supplemental oxygen Pulse ox and tele already placed ETCO2 monitor applied.  SXN and ambu bag at bedside.   After consent obtained from primary service and RN, cardiology and ECHO tech at bedside we initially gave 50 mcg IV fent. This was followed by 40mg  propofol   With this the patient was sedated  Still had spont efforts and ETCO2 remained 30 (no change from baseline) Gave another 20mg  during the procedure.   VSS  I was at bedside when she awoke.  Pt tolerated procedure well.

## 2023-07-10 NOTE — Progress Notes (Signed)
 Physical Therapy Treatment Patient Details Name: Vicki [name] MRN: [id number] DOB: 1950/02/10 Today's Date: 07/10/2023   History of Present Illness 73 y/o female presenting to the ED 06/27/23 w/ 3 day h/o severe chest pain. In cardiogenic shock due to RV failure, hypotensive, bradycardia. R and L heart cath; Chest CT ulcerated plaque in descending aorta; Impella 6/10-6/15; 6/18 arrhythmias requiring RIJ TVP  PMH-chronic back pain, MI, HLD, HTN, migraines, TIA, CVAx3, osteopenia, PTSD,    PT Comments  Discussed with [place], RN level of activity pt can participate in as still has RIJ TVP. She reports she is basically pacing herself with very few firings of pacemaker. With stronger underlying rhythm, OK to dangle EOB and even OOB if tolerated. Patient's BP responded appropriately throughout session (see below) and HR initially remained in 70s until she stood up with drop to 51 bpm. Assisted to chair and HR ranging from 45-51 bpm with RN made aware. She reported pt has had periods where she dropped like this throughout the day and ok to leave pt up in chair and she will monitor her. Pt asymptomatic throughout.   If plan is discharge home, recommend the following: A little help with walking and/or transfers;Assistance with cooking/housework;Direct supervision/assist for financial management;Direct supervision/assist for medications management;Help with stairs or ramp for entrance;Supervision due to cognitive status   Can travel by private vehicle        Equipment Recommendations  None recommended by PT    Recommendations for Other Services       Precautions / Restrictions Precautions Precautions: Fall Recall of Precautions/Restrictions: Impaired Precaution/Restrictions Comments: TVP; R IJ Restrictions Weight Bearing Restrictions Per Provider Order: No     Mobility  Bed Mobility Overal bed mobility: Needs Assistance Bed Mobility: Supine to Sit     Supine to sit: HOB elevated, Min  assist     General bed mobility comments: pt with multiple lines including RIJ TVP    Transfers Overall transfer level: Needs assistance Equipment used: 1 person hand held assist Transfers: Sit to/from Stand, Bed to chair/wheelchair/BSC Sit to Stand: Min assist   Step pivot transfers: Min assist       General transfer comment: Rt HHA to stand and step-pivot    Ambulation/Gait               General Gait Details: unable; HR down to 45 with standing; RN aware and OK with pt up to chair   Stairs             Wheelchair Mobility     Tilt Bed    Modified Rankin (Stroke Patients Only)       Balance Overall balance assessment: Needs assistance Sitting-balance support: No upper extremity supported, Feet unsupported Sitting balance-Leahy Scale: Fair     Standing balance support: During functional activity, Single extremity supported Standing balance-Leahy Scale: Poor                              Communication Communication Communication: No apparent difficulties  Cognition Arousal: Alert Behavior During Therapy: WFL for tasks assessed/performed   PT - Cognitive impairments: No family/caregiver present to determine baseline                       PT - Cognition Comments: able to relay information re: TEE results Following commands: Intact      Cueing Cueing Techniques: Verbal cues  Exercises  General Comments General comments (skin integrity, edema, etc.): Supine HR 72 BP 119/63; sitting HR 74 BP 123/65; standing HR 45-51 with no symptoms; up in chair HR 45-51 and RN aware      Pertinent Vitals/Pain Pain Assessment Pain Assessment: No/denies pain    Home Living                          Prior Function            PT Goals (current goals can now be found in the care plan section) Acute Rehab PT Goals Patient Stated Goal: none stated PT Goal Formulation: With patient Time For Goal Achievement:  07/17/23 Potential to Achieve Goals: Good Progress towards PT goals: Progressing toward goals    Frequency    Min 2X/week      PT Plan      Co-evaluation              AM-PAC PT 6 Clicks Mobility   Outcome Measure  Help needed turning from your back to your side while in a flat bed without using bedrails?: A Little Help needed moving from lying on your back to sitting on the side of a flat bed without using bedrails?: A Little Help needed moving to and from a bed to a chair (including a wheelchair)?: A Little Help needed standing up from a chair using your arms (e.g., wheelchair or bedside chair)?: A Little Help needed to walk in hospital room?: Total Help needed climbing 3-5 steps with a railing? : Total 6 Click Score: 14    End of Session Equipment Utilized During Treatment: Oxygen Activity Tolerance: Treatment limited secondary to medical complications (Comment) (drop in HR) Patient left: with call bell/phone within reach;in chair Nurse Communication: Mobility status;Other (comment) (drop in HR) PT Visit Diagnosis: Difficulty in walking, not elsewhere classified (R26.2)     Time: 8487-8465 PT Time Calculation (min) (ACUTE ONLY): 22 min  Charges:    $Therapeutic Activity: 8-22 mins PT General Charges $$ ACUTE PT VISIT: 1 Visit                      Macario RAMAN, PT Acute Rehabilitation Services  Office [phone]    Macario SHAUNNA Soja 07/10/2023, 4:02 PM

## 2023-07-10 NOTE — Progress Notes (Addendum)
 [facility] Infectious Diseases Follow Up Note  Patient Identification: Patient Name: Vicki [name] MRN: [id number] Admit Date: 06/27/2023  9:55 AM Age: 73 y.o.Today's Date: 07/10/2023   Reason for Visit: bacteremia   Principal Problem:   Cardiogenic shock [facility]) Active Problems:   Coagulase negative Staphylococcus bacteremia   Antibiotics: Cefazolin  6/17-c  Lines/Hardwares: rt arm PICC  Interval Events: afebrile. TEE today    Assessment 73 year old female with prior history of CVA, alcohol use disorder admitted with  # Cardiogenic shock/RV failure  - s/p impella, removed 6/15 - Stopped dubutamine on 6/20  # Tachybrady syndrome - on Temp pacer, EP following  # MSSE bacteremia  - in the setting of multiple device placement for RHF( PICC and pulm artery catheter placed after blood cx and unlikely the source). Appears to have arterial line as well as impella prior to blood cx and ? related to bacteremia. No phlebitis on exam. Blood cx on admit 6/10 NG. 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  - Monitor CBC and BMP  - Universal/standard isolation precautions.   Rest of the management as per the primary team. Thank you for the consult. Please page with pertinent questions or concerns.  ______________________________________________________________________ Subjective patient seen and examined at the bedside. Husband at bedside. No complaints.   Past  Medical History:  Diagnosis Date   Alcohol intoxication (HCC) 05/2014    ED note    Anxiety    Arthritis    Boils    Chronic back pain    Community acquired pneumonia 01/05/2016   Depression    Fibromyalgia    GERD (gastroesophageal reflux disease)    Heart attack (HCC)    Hepatic cyst 01/06/2016   Hyperlipidemia    Hypertension    Insomnia    Leg cramps    Migraines    with aura   Mini stroke 05/2014   with paralysis for 1 hr   Osteopenia    PONV (postoperative nausea and vomiting)    severe   Postmenopausal bleeding 12/2015   PTSD (post-traumatic stress disorder)    S/P epidural steroid injection    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, passed out, 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 followed up with eye doctor no further issues   Spinal stenosis    Squamous cell carcinoma 12/25/2015   right leg and nose   Strep throat 01/05/2016   Stroke (HCC)    3 strokes, no residual   Past Surgical History:  Procedure Laterality Date   BREAST ENHANCEMENT SURGERY     CARDIAC CATHETERIZATION N/A 08/12/2015   Procedure: Left Heart Cath and Coronary Angiography;  Surgeon: Candyce GORMAN Reek, MD;  Location: [facility] INVASIVE CV LAB;  Service: Cardiovascular;  Laterality: N/A;   COLONOSCOPY     DILATATION & CURETTAGE/HYSTEROSCOPY WITH MYOSURE N/A 02/22/2016   Procedure: DILATATION & CURETTAGE/HYSTEROSCOPY WITH MYOSURE;  Surgeon: Bobie FORBES Cathlyn JAYSON Nikki, MD;  Location: [facility] ORS;  Service: Gynecology;  Laterality: N/A;  FACIAL COSMETIC SURGERY     HAND SURGERY     nerve & tendon repair   LIPOSUCTION     LUMBAR EPIDURAL INJECTION  06/16/2015   RIGHT/LEFT HEART CATH AND CORONARY ANGIOGRAPHY N/A 06/27/2023   Procedure: RIGHT/LEFT HEART CATH AND CORONARY ANGIOGRAPHY;  Surgeon: Cherrie Toribio SAUNDERS, MD;  Location: [facility] INVASIVE CV LAB;  Service: Cardiovascular;  Laterality: N/A;   SKIN BIOPSY Right 02/22/2016   Procedure: BIOPSY SKIN;  Surgeon: Bobie FORBES Cathlyn JAYSON Nikki, MD;  Location: [facility];  Service: Gynecology;   Laterality: Right;   SQUAMOUS CELL CARCINOMA EXCISION     TEMPORARY PACEMAKER N/A 07/05/2023   Procedure: TEMPORARY PACEMAKER;  Surgeon: Zenaida Morene PARAS, MD;  Location: [facility] INVASIVE CV LAB;  Service: Cardiovascular;  Laterality: N/A;   VENTRICULAR ASSIST DEVICE INSERTION N/A 06/27/2023   Procedure: VENTRICULAR ASSIST DEVICE INSERTION;  Surgeon: Cherrie Toribio SAUNDERS, MD;  Location: [facility] INVASIVE CV LAB;  Service: Cardiovascular;  Laterality: N/A;   WISDOM TOOTH EXTRACTION     Vitals BP 129/61   Pulse 88   Temp 97.9 F (36.6 C) (Oral)   Resp (!) 28   Ht 5' 3 (1.6 m)   Wt 75.2 kg   LMP 07/18/2011   SpO2 92%   BMI 29.37 kg/m      Physical Exam Constitutional:  adult female sitting up in the bed    Comments: NAD, HEENT wnl   Cardiovascular:     Rate and Rhythm: Normal rate and regular rhythm.     Heart sounds:  Pulmonary:     Effort: Pulmonary effort is normal.\ on [place]    Comments:   Abdominal:     Palpations: Abdomen is non distended      Tenderness:   Musculoskeletal:        General: No swelling or tenderness in peripheral joints  Skin:    Comments: no rashes, RT internal jugular temp pacer  Neurological:     General: awake, alert and oriented   Psychiatric:        Mood and Affect: Mood normal.   Pertinent Microbiology Results for orders placed or performed during the hospital encounter of 06/27/23  Culture, blood (routine x 2)     Status: None   Collection Time: 06/27/23 12:00 PM   Specimen: BLOOD LEFT HAND  Result Value Ref Range Status   Specimen Description BLOOD LEFT HAND  Final   Special Requests   Final    BOTTLES DRAWN AEROBIC ONLY Blood Culture adequate volume   Culture   Final    NO GROWTH 5 DAYS Performed at [facility] Lab, [address] [place], [place] [address]    Report Status 07/02/2023 FINAL  Final  MRSA Next Gen by PCR, Nasal     Status: None   Collection Time: 06/27/23  4:24 PM   Specimen: Nasal Mucosa; Nasal Swab  Result Value Ref  Range Status   MRSA by PCR Next Gen NOT DETECTED NOT DETECTED Final    Comment: (NOTE) The GeneXpert MRSA Assay (FDA approved for NASAL specimens only), is one component of a comprehensive MRSA colonization surveillance program. It is not intended to diagnose MRSA infection nor to guide or monitor treatment for MRSA infections. Test performance is not FDA approved in patients less than 22 years old. Performed at [facility] Lab, [address] [place], [place] [address]   Culture, blood (routine x 2)     Status: None   Collection Time: 06/27/23  5:32  PM   Specimen: BLOOD LEFT HAND  Result Value Ref Range Status   Specimen Description BLOOD LEFT HAND  Final   Special Requests   Final    BOTTLES DRAWN AEROBIC AND ANAEROBIC Blood Culture results may not be optimal due to an inadequate volume of blood received in culture bottles   Culture   Final    NO GROWTH 5 DAYS Performed at [facility] Lab, [address] [place], [place] [address]    Report Status 07/02/2023 FINAL  Final  Culture, blood (Routine X 2) w Reflex to ID Panel     Status: Abnormal   Collection Time: 07/04/23 10:07 AM   Specimen: BLOOD LEFT HAND  Result Value Ref Range Status   Specimen Description BLOOD LEFT HAND  Final   Special Requests   Final    BOTTLES DRAWN AEROBIC AND ANAEROBIC Blood Culture adequate volume   Culture  Setup Time   Final    GRAM POSITIVE COCCI IN CLUSTERS IN BOTH AEROBIC AND ANAEROBIC BOTTLES CRITICAL RESULT CALLED TO, READ BACK BY AND VERIFIED WITH: PHARMD J LEDFORD 07/05/2023 @ 0205 BY AB Performed at [facility] Lab, [address], [place], [place] [address]    Culture STAPHYLOCOCCUS EPIDERMIDIS (A)  Final   Report Status 07/07/2023 FINAL  Final   Organism ID, Bacteria STAPHYLOCOCCUS EPIDERMIDIS  Final      Susceptibility   Staphylococcus epidermidis - MIC*    CIPROFLOXACIN <=0.5 SENSITIVE Sensitive     ERYTHROMYCIN >=8 RESISTANT Resistant     GENTAMICIN <=0.5 SENSITIVE  Sensitive     OXACILLIN <=0.25 SENSITIVE Sensitive     TETRACYCLINE <=1 SENSITIVE Sensitive     VANCOMYCIN 1 SENSITIVE Sensitive     TRIMETH /SULFA  >=320 RESISTANT Resistant     CLINDAMYCIN <=0.25 SENSITIVE Sensitive     RIFAMPIN <=0.5 SENSITIVE Sensitive     Inducible Clindamycin NEGATIVE Sensitive     * STAPHYLOCOCCUS EPIDERMIDIS  Culture, blood (Routine X 2) w Reflex to ID Panel     Status: Abnormal   Collection Time: 07/04/23 10:07 AM   Specimen: BLOOD RIGHT HAND  Result Value Ref Range Status   Specimen Description BLOOD RIGHT HAND  Final   Special Requests   Final    BOTTLES DRAWN AEROBIC AND ANAEROBIC Blood Culture adequate volume   Culture  Setup Time   Final    GRAM POSITIVE COCCI IN CLUSTERS IN BOTH AEROBIC AND ANAEROBIC BOTTLES CRITICAL VALUE NOTED.  VALUE IS CONSISTENT WITH PREVIOUSLY REPORTED AND CALLED VALUE. Performed at [facility] Lab, [address], [place], [place] [address]    Culture STAPHYLOCOCCUS EPIDERMIDIS (A)  Final   Report Status 07/07/2023 FINAL  Final   Organism ID, Bacteria STAPHYLOCOCCUS EPIDERMIDIS  Final      Susceptibility   Staphylococcus epidermidis - MIC*    CIPROFLOXACIN <=0.5 SENSITIVE Sensitive     ERYTHROMYCIN >=8 RESISTANT Resistant     GENTAMICIN <=0.5 SENSITIVE Sensitive     OXACILLIN <=0.25 SENSITIVE Sensitive     TETRACYCLINE <=1 SENSITIVE Sensitive     VANCOMYCIN 1 SENSITIVE Sensitive     TRIMETH /SULFA  160 RESISTANT Resistant     CLINDAMYCIN <=0.25 SENSITIVE Sensitive     RIFAMPIN <=0.5 SENSITIVE Sensitive     Inducible Clindamycin NEGATIVE Sensitive     * STAPHYLOCOCCUS EPIDERMIDIS  Blood Culture ID Panel (Reflexed)     Status: Abnormal   Collection Time: 07/04/23 10:07 AM  Result Value Ref Range Status   Enterococcus faecalis NOT  DETECTED NOT DETECTED Final   Enterococcus Faecium NOT DETECTED NOT DETECTED Final   Listeria monocytogenes NOT DETECTED NOT DETECTED Final   Staphylococcus species DETECTED (A) NOT DETECTED  Final    Comment: CRITICAL RESULT CALLED TO, READ BACK BY AND VERIFIED WITH: PHARMD J LEDFORD 07/05/2023 @ 0205 BY AB    Staphylococcus aureus (BCID) NOT DETECTED NOT DETECTED Final   Staphylococcus epidermidis DETECTED (A) NOT DETECTED Final    Comment: CRITICAL RESULT CALLED TO, READ BACK BY AND VERIFIED WITH: PHARMD J LEDFORD 07/05/2023 @ 0205 BY AB    Staphylococcus lugdunensis NOT DETECTED NOT DETECTED Final   Streptococcus species NOT DETECTED NOT DETECTED Final   Streptococcus agalactiae NOT DETECTED NOT DETECTED Final   Streptococcus pneumoniae NOT DETECTED NOT DETECTED Final   Streptococcus pyogenes NOT DETECTED NOT DETECTED Final   A.calcoaceticus-baumannii NOT DETECTED NOT DETECTED Final   Bacteroides fragilis NOT DETECTED NOT DETECTED Final   Enterobacterales NOT DETECTED NOT DETECTED Final   Enterobacter cloacae complex NOT DETECTED NOT DETECTED Final   Escherichia coli NOT DETECTED NOT DETECTED Final   Klebsiella aerogenes NOT DETECTED NOT DETECTED Final   Klebsiella oxytoca NOT DETECTED NOT DETECTED Final   Klebsiella pneumoniae NOT DETECTED NOT DETECTED Final   Proteus species NOT DETECTED NOT DETECTED Final   Salmonella species NOT DETECTED NOT DETECTED Final   Serratia marcescens NOT DETECTED NOT DETECTED Final   Haemophilus influenzae NOT DETECTED NOT DETECTED Final   Neisseria meningitidis NOT DETECTED NOT DETECTED Final   Pseudomonas aeruginosa NOT DETECTED NOT DETECTED Final   Stenotrophomonas maltophilia NOT DETECTED NOT DETECTED Final   Candida albicans NOT DETECTED NOT DETECTED Final   Candida auris NOT DETECTED NOT DETECTED Final   Candida glabrata NOT DETECTED NOT DETECTED Final   Candida krusei NOT DETECTED NOT DETECTED Final   Candida parapsilosis NOT DETECTED NOT DETECTED Final   Candida tropicalis NOT DETECTED NOT DETECTED Final   Cryptococcus neoformans/gattii NOT DETECTED NOT DETECTED Final   Methicillin resistance mecA/C NOT DETECTED NOT DETECTED  Final    Comment: Performed at [facility] Lab, [address] [place], [place] [address]  Culture, blood (Routine X 2) w Reflex to ID Panel     Status: None (Preliminary result)   Collection Time: 07/06/23  8:26 AM   Specimen: BLOOD LEFT ARM  Result Value Ref Range Status   Specimen Description BLOOD LEFT ARM  Final   Special Requests   Final    BOTTLES DRAWN AEROBIC AND ANAEROBIC Blood Culture adequate volume   Culture   Final    NO GROWTH 4 DAYS Performed at [facility] Lab, [address], [place], [place] [address]    Report Status PENDING  Incomplete  Culture, blood (Routine X 2) w Reflex to ID Panel     Status: None (Preliminary result)   Collection Time: 07/06/23  8:26 AM   Specimen: BLOOD LEFT HAND  Result Value Ref Range Status   Specimen Description BLOOD LEFT HAND  Final   Special Requests   Final    BOTTLES DRAWN AEROBIC AND ANAEROBIC Blood Culture adequate volume   Culture   Final    NO GROWTH 4 DAYS Performed at [facility] Lab, [address] [place], [place] [address]    Report Status PENDING  Incomplete   Pertinent Lab.    Latest Ref Rng & Units 07/10/2023    5:04 AM 07/09/2023    6:50 AM 07/08/2023    5:25 AM  CBC  WBC 4.0 - 10.5 K/uL 8.9  8.8  8.9   Hemoglobin 12.0 - 15.0 g/dL 9.3  9.2  8.5   Hematocrit 36.0 - 46.0 % 28.9  28.3  25.7   Platelets 150 - 400 K/uL 237  211  197       Latest Ref Rng & Units 07/10/2023    5:04 AM 07/09/2023    6:50 AM 07/08/2023   10:07 AM  CMP  Glucose 70 - 99 mg/dL 892  879  841   BUN 8 - 23 mg/dL 8  9  10    Creatinine 0.44 - 1.00 mg/dL 9.47  9.38  9.32   Sodium 135 - 145 mmol/L 132  128  129   Potassium 3.5 - 5.1 mmol/L 3.9  4.1  4.4   Chloride 98 - 111 mmol/L 96  94  94   CO2 22 - 32 mmol/L 28  29  30    Calcium  8.9 - 10.3 mg/dL 8.3  7.7  8.3    Pertinent Imaging today Plain films and CT images have been personally visualized and interpreted; radiology reports have been reviewed. Decision making  incorporated into the Impression  No results found.  I spent 36  minutes involved in face-to-face and non-face-to-face activities for this patient on the day of the visit. Professional time spent includes the following activities: Preparing to see the patient (review of tests), Obtaining and reviewing separately obtained history (admission/discharge record), Performing a medically appropriate examination and evaluation , Ordering medications/labs, referring and communicating with other health care professionals, Documenting clinical information in the EMR, Independently interpreting results (not separately reported), Communicating results to the patient/family/caregiver, Counseling and educating the patient/family/caregiver and Care coordination (not separately reported).   Plan d/w requesting provider as well as ID pharm D  Of note, portions of this note may have been created with voice recognition software. While this note has been edited for accuracy, occasional wrong-word or 'sound-a-like' substitutions may have occurred due to the inherent limitations of voice recognition software.   Electronically signed by:   Annalee Orem, MD Infectious Disease Physician [facility] Pager: [phone]

## 2023-07-11 ENCOUNTER — Telehealth (HOSPITAL_COMMUNITY): Payer: Self-pay | Admitting: Pharmacy Technician

## 2023-07-11 ENCOUNTER — Other Ambulatory Visit (HOSPITAL_COMMUNITY): Payer: Self-pay

## 2023-07-11 DIAGNOSIS — I495 Sick sinus syndrome: Secondary | ICD-10-CM

## 2023-07-11 DIAGNOSIS — R570 Cardiogenic shock: Secondary | ICD-10-CM | POA: Diagnosis not present

## 2023-07-11 HISTORY — DX: Sick sinus syndrome: I49.5

## 2023-07-11 LAB — CULTURE, BLOOD (ROUTINE X 2)
Culture: NO GROWTH
Culture: NO GROWTH
Special Requests: ADEQUATE
Special Requests: ADEQUATE

## 2023-07-11 LAB — CBC
HCT: 30.1 % — ABNORMAL LOW (ref 36.0–46.0)
Hemoglobin: 10 g/dL — ABNORMAL LOW (ref 12.0–15.0)
MCH: 31.3 pg (ref 26.0–34.0)
MCHC: 33.2 g/dL (ref 30.0–36.0)
MCV: 94.4 fL (ref 80.0–100.0)
Platelets: 293 10*3/uL (ref 150–400)
RBC: 3.19 MIL/uL — ABNORMAL LOW (ref 3.87–5.11)
RDW: 13.7 % (ref 11.5–15.5)
WBC: 9.5 10*3/uL (ref 4.0–10.5)
nRBC: 0 % (ref 0.0–0.2)

## 2023-07-11 LAB — MAGNESIUM: Magnesium: 2.1 mg/dL (ref 1.7–2.4)

## 2023-07-11 LAB — BASIC METABOLIC PANEL WITH GFR
Anion gap: 7 (ref 5–15)
BUN: 9 mg/dL (ref 8–23)
CO2: 29 mmol/L (ref 22–32)
Calcium: 8.3 mg/dL — ABNORMAL LOW (ref 8.9–10.3)
Chloride: 95 mmol/L — ABNORMAL LOW (ref 98–111)
Creatinine, Ser: 0.6 mg/dL (ref 0.44–1.00)
GFR, Estimated: >60 mL/min (ref >60)
Glucose, Bld: 102 mg/dL — ABNORMAL HIGH (ref 70–99)
Potassium: 4 mmol/L (ref 3.5–5.1)
Sodium: 131 mmol/L — ABNORMAL LOW (ref 135–145)

## 2023-07-11 LAB — COOXEMETRY PANEL
Carboxyhemoglobin: 2.4 % — ABNORMAL HIGH (ref 0.5–1.5)
Methemoglobin: 1.3 % (ref 0.0–1.5)
O2 Saturation: 67.9 %
Total hemoglobin: 10.6 g/dL — ABNORMAL LOW (ref 12.0–16.0)

## 2023-07-11 LAB — HEPARIN LEVEL (UNFRACTIONATED): Heparin Unfractionated: 0.7 [IU]/mL (ref 0.30–0.70)

## 2023-07-11 MED ORDER — POLYETHYLENE GLYCOL 3350 17 G PO PACK: 17.0000 g | PACK | Freq: Every day | ORAL | Status: DC | PRN

## 2023-07-11 MED ORDER — LINEZOLID 600 MG PO TABS: 600.0000 mg | ORAL_TABLET | Freq: Two times a day (BID) | ORAL | Status: DC

## 2023-07-11 MED ORDER — IRBESARTAN 150 MG PO TABS
150.0000 mg | ORAL_TABLET | Freq: Every day | ORAL | Status: DC
  Administered 2023-07-12: 150 mg via ORAL
  Filled 2023-07-11 (×2): qty 1

## 2023-07-11 NOTE — Progress Notes (Signed)
 Per MD order, right PICC removed. Pressure dressing applied and no bleeding noted. Spoke with ID who stated that a new PICC can be placed since cultures have cleared, but would prefer in left arm if possible. Patient has 1 working PIV currently - may need a second depending on IV med compatibility.   Dressing can be removed 6/25 or 6/26 around 11am.   Delana Manganello R Isadora Delorey, RN

## 2023-07-11 NOTE — Progress Notes (Addendum)
 Patient Name: Vicki [name] Date of Encounter: 07/11/2023  Primary Cardiologist: None Electrophysiologist: None  Interval Summary   The patient is doing well today. Reports feeling well / rested overnight.   At this time, the patient denies chest pain, shortness of breath, or any new concerns.  Vital Signs    Vitals:   07/11/23 0330 07/11/23 0400 07/11/23 0500 07/11/23 0600  BP:  (!) 167/97 (!) 168/89 (!) 171/92  Pulse:  74 74 75  Resp:  16 12 19   Temp: 97.7 F (36.5 C)     TempSrc: Oral     SpO2:  93% 93% 93%  Weight:   78.1 kg   Height:        Intake/Output Summary (Last 24 hours) at 07/11/2023 0646 Last data filed at 07/10/2023 1900 Gross per 24 hour  Intake 490.04 ml  Output 1725 ml  Net -1234.96 ml   Filed Weights   07/09/23 0318 07/10/23 0500 07/11/23 0500  Weight: 75.1 kg 75.2 kg 78.1 kg    Physical Exam    GEN- The patient is well appearing, alert and oriented x 3 today.   Lungs- Clear to ausculation bilaterally, normal work of breathing Cardiac- Regular rate and rhythm, no murmurs, rubs or gallops GI- soft, NT, ND, + BS Extremities- no clubbing or cyanosis. No edema  Telemetry    SR 74' with occasional sinus pause (personally reviewed)  Hospital Course    Vicki [name] is a 73 y.o. female with PMH of HTN, HLD, tobacco abuse admitted 06/27/23 for malaise, weakness & chest pain in setting of NSTEMI and RV failure. Had new AF on 06/25/23 at [facility] but refused ER transfer. Emergent R/LHC with no significant CAD or culprit lesion, but unable to angiographically identify RCA (seen as small on RHC 2017), severe RV failure s/p Impella.  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 Impella removed 07/02/23, continued to require vasopressors.  Shock liver, AKI improved.  ON 6/17 noted to have fever to 102, leukocytosis.  BCx4 positive for staph EPI.  ID following, on abx. Continued to have intermittent bradycardia despite vasopressors, temp wire placed 07/05/23 in RIJ.    Assessment & Plan    SND with possible Sinus Arrest  Junctional Rhythm  Tachy-Brady   -pending TEE results, may place PPM on 6/25 pending lab availability and tele review overnight > if no pacing will not need PPM and can discharge with monitor in place   -conduction appears to be improving but does continue to show some degree of SND with sinus pauses  -TVP in place, threshold 7 mA on testing  > set for VVI 15 bpm, 35 mA -ok for diet   Acute RV Failure Acute Diastolic HF  NSTEMI with RV Infarct  -per primary   Paroxysmal AF  -heparin  gtt per pharmacy  -await transition to DOAC pending tele review on 6/25   Staph EPI Bacteremia  -await TEE results  -abx per ID  -prior review with ID > native valve endocarditis from staph epi is uncommon -early transvenous device would be at increased infection risk   Aortic Ulceration  By CTA 6/10, irregular atheromatous plaque along the lateral wall of the mid and distal descending thoracic aorta with a small ulcerated plaque about 8 mm in maximum thickness by 14 mm in maximum AP diameter -per primary      For questions  or updates, please contact [facility] Please consult [email or web address] for contact info under     Signed, Daphne Barrack, NP-C, AGACNP-BC  [facility] - Electrophysiology  07/11/2023, 6:46 AM  I have seen, examined the patient, and reviewed the above assessment and plan.    Interval:  No acute overnight events. Patient reports feeling relatively well. No new or acute complaints. Intermittent sinus pause with rare V pacing overnight.  General: Well developed, in no acute distress.  Neck: No JVD. RIJ temp wire.  Cardiac: Normal rate, regular rhythm.  Resp: Normal work of breathing.  Ext: No edema.  Neuro: No gross focal deficits.  Psych: Normal affect.   Assessment and Plan:  #.  Bradycardia: In the setting of her acute illness patient developed inappropriate bradycardia.  Temporary wire was placed.  After stopping dobutamine  she had increase in temporary usage.  Bradycardia appears to be mainly sinus node dysfunction.  With time it appears that sinus node has recovered.  Today she continues to conduct one-to-one at normal rates.  She had rare sinus pauses with V pacing. Overall, appears to be recovering.  - Decreased right IJ pacer to backup at VVI 30 bpm. Capture threshold increased to 7.  Programmed output at 15. - We will continue to evaluate daily.  I have not seen evidence of conduction disease. This appears to be isolated sinus node dysfunction that is improving. Query if she has occluded RCA as the etiology. No hard indication for permanent pacer implant at this time unless symptomatic from sinus node dysfunction.  Given bacteremia and need for prolonged IV antibiotics, would like to avoid pacer implant if possible.   #. Paroxysmal atrial fibrillation:  - Continue heparin .    #. Staph bacteremia: -Appreciate ID assistance.   Vicki Kitty, MD 07/11/2023 8:43 PM

## 2023-07-11 NOTE — Progress Notes (Addendum)
 Advanced Heart Failure Rounding Note  HF Cardiologist: Rolan Fuel, MD Chief Complaint: Cardiogenic Shock Subjective:    SR today.   No complaints.   Objective:    Vital Signs:   Temp:  [97.7 F (36.5 C)-98.3 F (36.8 C)] 98.3 F (36.8 C) (06/24 0751) Pulse Rate:  [48-88] 75 (06/24 0700) Resp:  [8-28] 20 (06/24 0700) BP: (106-179)/(61-97) 179/97 (06/24 0700) SpO2:  [90 %-99 %] 94 % (06/24 0700) Weight:  [78.1 kg] 78.1 kg (06/24 0500) Last BM Date : 07/10/23  Weight change: Filed Weights   07/09/23 0318 07/10/23 0500 07/11/23 0500  Weight: 75.1 kg 75.2 kg 78.1 kg   Intake/Output:  Intake/Output Summary (Last 24 hours) at 07/11/2023 0836 Last data filed at 07/11/2023 0610 Gross per 24 hour  Intake 748.23 ml  Output 2125 ml  Net -1376.77 ml  General:   No resp difficulty  Neck: supple. no JVD.  Cor: PMI nondisplaced. Regular rate & rhythm. No rubs, gallops or murmurs. + TVP  Lungs: clear Abdomen: soft, nontender, nondistended.  Extremities: no cyanosis, clubbing, rash, edema Neuro: alert & oriented x3  Telemetry:  SR 70s   Labs: Basic Metabolic Panel: Recent Labs  Lab 07/07/23 0500 07/08/23 0525 07/08/23 1007 07/09/23 0650 07/10/23 0504 07/11/23 0500  NA 125* 121* 129* 128* 132* 131*  K 3.6 3.7 4.4 4.1 3.9 4.0  CL 85* 81* 94* 94* 96* 95*  CO2 29 23 30 29 28 29   GLUCOSE 118* 533* 158* 120* 107* 102*  BUN 15 8 10 9 8 9   CREATININE 0.70 1.09* 0.67 0.61 0.52 0.60  CALCIUM  8.4* 6.6* 8.3* 7.7* 8.3* 8.3*  MG 1.8 1.8  --  2.2 1.9 2.1   Liver Function Tests: No results for input(s): AST, ALT, ALKPHOS, BILITOT, PROT, ALBUMIN in the last 168 hours.  CBC: Recent Labs  Lab 07/07/23 0500 07/08/23 0525 07/09/23 0650 07/10/23 0504 07/11/23 0500  WBC 10.7* 8.9 8.8 8.9 9.5  HGB 9.4* 8.5* 9.2* 9.3* 10.0*  HCT 27.5* 25.7* 28.3* 28.9* 30.1*  MCV 92.9 95.5 96.3 97.3 94.4  PLT 179 197 211 237 293   BNP (last 3 results) Recent Labs     06/27/23 1020  BNP 543.0*   Medications:    Scheduled Medications:  aspirin  EC  81 mg Oral Daily   Chlorhexidine  Gluconate Cloth  6 each Topical Daily   feeding supplement  237 mL Oral BID BM   levothyroxine   50 mcg Oral Daily   lidocaine   1 patch Transdermal Q24H   multivitamin with minerals  1 tablet Oral Daily   pantoprazole   40 mg Oral Daily   polyethylene glycol  17 g Oral BID   senna  1 tablet Oral QHS   sertraline   100 mg Oral Daily   sodium chloride  flush  10-40 mL Intracatheter Q12H   sodium chloride  flush  3 mL Intravenous Q12H   sodium chloride  flush  3-10 mL Intravenous Q12H   torsemide   20 mg Oral Daily   Infusions:   ceFAZolin  (ANCEF ) IV 2 g (07/11/23 0610)   heparin  1,200 Units/hr (07/11/23 0833)   PRN Medications: sodium chloride , acetaminophen , lip balm, ondansetron  (ZOFRAN ) IV, mouth rinse, oxyCODONE , sodium chloride  flush, sodium chloride  flush, sodium chloride  flush  Assessment/Plan:   Cardiogenic Shock, 2/2 Profound RV Failure --> Resolved - cath 6/10 normal left-dominant system with non-visualized tiny RCA - Echo LVEF 60-65% RV moderate HK with McConnell's sign - RHC 6/10 (on NE 15): RA 18, PA 35/21 (26),  PCWP 19, CO/CI (TD) 2.9/1.6, PVR 0.8, PAPi 0.78 - CT w/o PE - s/p Impella RP placement. Removed 6/15. - POCUS ECHO 6/12 w/ improvement LVEF 60% RV mild HK  - 6/19 Echo No evidence of endocarditis. RV normal. LV 60-65%.  - Stopped dobutamine  on 6/20.  - CO-OX stable. Appears euvolemic.  - Continue torsemide  20mg  daily  Acute diastolic HF - plan as above  NSTEMI with RV infarct: Resolved  - plan as above - continue ASA - no b-blocker with shock  Tachy-brady Syndrome - Previously in/out AF in 120-130s and acclerated junctional in 40s. - Now in sinus bradycardia, rates in the 30-40s. Feels better paced - TEE - no endocarditis.  - Temp perm hopefully following to allow her to rehab - continue heparin  gtt while considering invasive  procedures - TVP in place.  Bld Cx from 6/19 negative - In SR today.    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 Na 131  - restrict FW  Thrombocytopenia - heparin  switched to bival on 6/13. PLTs stabilized/improved off heparin  - SRA negative - continue heparin  gtt - PLT within normal range.   Bacteremia: Line related source - Lines removed. Bcx 6/17 grew MSSE.  - WBC normal.  -- TEE no endocarditis.   -Per ID, Dr Ula- planning for 2 weeks IV cefazolin  then 2 weeks Linezolid . Duration is 4 weeks because of the ulcerated plaque in her aorta and want to be aggressive so that it does not become infected. She will need another PICC placed prior to d/c in the other arm ---> left.   -  6/19 blood cultures- NGT -EP following.    Hyperkalemia: resolved Shock Liver: resolved AKI: resolved  Hypertension  Start Irbesartan  150 mg daily.     Length of Stay: 14    Greig Mosses, NP 07/11/2023, 8:36 AM  Advanced Heart Failure Team Pager [phone] (M-F; 7a - 5p)

## 2023-07-11 NOTE — Progress Notes (Signed)
 ANTICOAGULATION CONSULT NOTE  Pharmacy Consult for heparin >> bivalirudin  >>heparin  Indication: Impella RP >Afib  Allergies  Allergen Reactions   Codeine Other (See Comments)    headache   Crestor [Rosuvastatin] Other (See Comments)    Muscle aches   Other Nausea And Vomiting    Anesthesia-severe vomiting   Simvastatin Other (See Comments)    Muscle aches   Egg White (Egg Protein)     Other Reaction(s): Throat closes   Ezetimibe      Other Reaction(s): crippling muscle aches   Hydrocodone-Acetaminophen      Other Reaction(s): migraines   Peanut (Diagnostic)     Other Reaction(s): itchy   Shellfish Allergy Other (See Comments)    Throat swelling   Statins     Patient Measurements: Height: 5' 3 (160 cm) Weight: 78.1 kg (172 lb 2.9 oz) IBW/kg (Calculated) : 52.4 Heparin  Dosing Weight: 68 kg   Vital Signs: Temp: 97.7 F (36.5 C) (06/24 0330) Temp Source: Oral (06/24 0330) BP: 179/97 (06/24 0700) Pulse Rate: 75 (06/24 0700)  Labs: Recent Labs    07/09/23 0650 07/10/23 0504 07/11/23 0500  HGB 9.2* 9.3* 10.0*  HCT 28.3* 28.9* 30.1*  PLT 211 237 293  HEPARINUNFRC 0.52 0.66 0.70  CREATININE 0.61 0.52 0.60    Estimated Creatinine Clearance: 62.9 mL/min (by C-G formula based on SCr of 0.6 mg/dL).   Assessment: 73 yo female in cardiogenic shock with RV failure s/p Impella RP 6/10.  Not on anticoagulation prior to admission.  Pharmacy consulted for heparin  dosing.  Chest CT w/ Type B dissection (small ulcerated plaque/developing intramural hematoma ~8 mm x 14 mm).   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  Discussed with team since HIT negative will change to heparin  infusion 6/17.   Heparin  level has trended up to higher end of therapeutic range at 0.70 on 1250 units/hr. Hgb 10.0, pltc 293 - stable.  No s/sx of bleeding or infusion issues.  Running through L PIV, drawn from PICC.    Goal of Therapy:  Heparin  level 0.3-0.7 units/ml Monitor platelets by anticoagulation  protocol: Yes   Plan:  Decrease heparin  IV slightly to 1200 units/hr  Monitor daily HL, CBC, and for s/sx of bleeding   Thank you for allowing pharmacy to participate in this patient's care,  Maurilio Fila, PharmD Clinical Pharmacist 07/11/2023  7:39 AM

## 2023-07-11 NOTE — Plan of Care (Signed)
  Problem: Activity: Goal: Risk for activity intolerance will decrease Outcome: Progressing   Problem: Elimination: Goal: Will not experience complications related to bowel motility Outcome: Progressing   Problem: Pain Managment: Goal: General experience of comfort will improve and/or be controlled Outcome: Progressing   Problem: Safety: Goal: Ability to remain free from injury will improve Outcome: Progressing

## 2023-07-11 NOTE — Progress Notes (Addendum)
 PHARMACY CONSULT NOTE FOR:  OUTPATIENT  PARENTERAL ANTIBIOTIC THERAPY (OPAT)  Indication: MSSE bacteremia Regimen: Cefazolin  2g IV every 8 hours End date: 07/20/23 (2 weeks from neg BCx 6/19)  After completion of Cefazolin  course the patient should transition to linezolid 600 mg po bid to complete an additional 2 weeks for a total length of therapy of 4 weeks  IV antibiotic discharge orders are pended. To discharging provider:  please sign these orders via discharge navigator,  Select New Orders & click on the button choice - Manage This Unsigned Work.     Thank you for allowing pharmacy to be a part of this patient's care.  Almarie Lunger, PharmD, BCPS, BCIDP Infectious Diseases Clinical Pharmacist 07/11/2023 2:59 PM   **Pharmacist phone directory can now be found on [email or web address] ([email or web address]).  Listed under [facility].

## 2023-07-11 NOTE — Progress Notes (Addendum)
 [facility] Infectious Diseases Follow Up Note  Patient Identification: Patient Name: Vicki [name] MRN: [id number] Admit Date: 06/27/2023  9:55 AM Age: 73 y.o.Today's Date: 07/11/2023   Reason for Visit: bacteremia   Principal Problem:   Cardiogenic shock [facility]) Active Problems:   Coagulase negative Staphylococcus bacteremia   Bradycardia   Antibiotics: Cefazolin  6/17-c  Lines/Hardwares: rt arm PICC  Interval Events: afebrile. TEE prelim no vegetations or endocarditis   Assessment 73 year old female with prior history of CVA, alcohol use disorder admitted with  # Cardiogenic shock/RV failure/NSTEMI - s/p impella, removed 6/15 - Stopped dubutamine on 6/20 - CHF team managing   # Tachybrady syndrome - on Temp pacer, EP following, possible plans for temp perm allowing her to rehb in case needed.   # MSSE bacteremia  - in the setting of multiple device placement for RHF( PICC and pulm artery catheter placed after blood cx and unlikely the source). Appears to have arterial line as well as impella prior to blood cx and ? related to bacteremia. No phlebitis on exam. Blood cx on admit 6/10 NG. Repeat on 6/19 NG.  No other sources of bacteremia identified   # CT with small ulcerated plaque in the descending thoracic aorta (has been reviewed by cardiology with the CTS and VVS with changes 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)   Recommendations - continue IV cefazolin , plan for 2 weeks course through 7/3 then switch to PO linezolid for 2 more weeks. Low risk of serotonin syndrome with zoloft .,  - Remove PICC from RT arm and can place a new PICC on the other arm for IV antibiotics  - Monitor CBC and BMP  - OPAT as below.  - Universal/standard isolation precautions. - ID available as needed , recall back with questions or concerns - d/w primary    OPAT  Diagnosis: complicated MSSE bacteremia  Culture Result: MSSE  Allergies  Allergen  Reactions   Codeine Other (See Comments)    headache   Crestor [Rosuvastatin] Other (See Comments)    Muscle aches   Other Nausea And Vomiting    Anesthesia-severe vomiting   Simvastatin Other (See Comments)    Muscle aches   Egg White (Egg Protein)     Other Reaction(s): Throat closes   Ezetimibe      Other Reaction(s): crippling muscle aches   Hydrocodone-Acetaminophen      Other Reaction(s): migraines   Peanut (Diagnostic)     Other Reaction(s): itchy   Shellfish Allergy Other (See Comments)    Throat swelling   Statins     OPAT Orders Discharge antibiotics to be given via PICC line Discharge antibiotics: cefazolin  2g iv q8 through 7/3 > Linezolid 600mg  po bid for 2 weeks therafter Per pharmacy protocol  Duration: 4 weeks  End Date: July 3( cefazolin ), July 17 ( PO linezolid)  PIC Care Per Protocol:  Home health RN for IV administration and teaching; PICC line care and labs.    Labs weekly while on IV antibiotics: X__ CBC with differential X__ BMP __ CMP X__ CRP X__ ESR __ Vancomycin trough __ CK  X__ Please pull PIC at completion of IV antibiotics __ Please leave PIC in place until doctor has seen patient or been notified  Fax weekly labs to [phone]  Clinic Follow Up Appt: 7/3 @ 2pm    Rest of the management as per the primary team. Thank you for the consult. Please page with pertinent questions or concerns.  ______________________________________________________________________ Subjective patient seen and examined at the  bedside. Husband at bedside. No complaints. Discussed antibiotic plans.   Past Medical History:  Diagnosis Date   Alcohol intoxication (HCC) 05/2014    ED note    Anxiety    Arthritis    Boils    Chronic back pain    Community acquired pneumonia 01/05/2016   Depression    Fibromyalgia    GERD (gastroesophageal reflux disease)    Heart attack (HCC)    Hepatic cyst 01/06/2016   Hyperlipidemia    Hypertension    Insomnia     Leg cramps    Migraines    with aura   Mini stroke 05/2014   with paralysis for 1 hr   Osteopenia    PONV (postoperative nausea and vomiting)    severe   Postmenopausal bleeding 12/2015   PTSD (post-traumatic stress disorder)    S/P epidural steroid injection    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, passed out, 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 followed up with eye doctor no further issues   Spinal stenosis    Squamous cell carcinoma 12/25/2015   right leg and nose   Strep throat 01/05/2016   Stroke (HCC)    3 strokes, no residual   Past Surgical History:  Procedure Laterality Date   BREAST ENHANCEMENT SURGERY     CARDIAC CATHETERIZATION N/A 08/12/2015   Procedure: Left Heart Cath and Coronary Angiography;  Surgeon: Candyce GORMAN Reek, MD;  Location: [facility] INVASIVE CV LAB;  Service: Cardiovascular;  Laterality: N/A;   COLONOSCOPY     DILATATION & CURETTAGE/HYSTEROSCOPY WITH MYOSURE N/A 02/22/2016   Procedure: DILATATION & CURETTAGE/HYSTEROSCOPY WITH MYOSURE;  Surgeon: Bobie FORBES Cathlyn JAYSON Nikki, MD;  Location: [facility] ORS;  Service: Gynecology;  Laterality: N/A;   FACIAL COSMETIC SURGERY     HAND SURGERY     nerve & tendon repair   LIPOSUCTION     LUMBAR EPIDURAL INJECTION  06/16/2015   RIGHT/LEFT HEART CATH AND CORONARY ANGIOGRAPHY N/A 06/27/2023   Procedure: RIGHT/LEFT HEART CATH AND CORONARY ANGIOGRAPHY;  Surgeon: Cherrie Toribio SAUNDERS, MD;  Location: [facility] INVASIVE CV LAB;  Service: Cardiovascular;  Laterality: N/A;   SKIN BIOPSY Right 02/22/2016   Procedure: BIOPSY SKIN;  Surgeon: Bobie FORBES Cathlyn JAYSON Nikki, MD;  Location: [facility] ORS;  Service: Gynecology;  Laterality: Right;   SQUAMOUS CELL CARCINOMA EXCISION     TEMPORARY PACEMAKER N/A 07/05/2023   Procedure: TEMPORARY PACEMAKER;  Surgeon: Zenaida Morene PARAS, MD;  Location: [facility] INVASIVE CV LAB;  Service: Cardiovascular;  Laterality: N/A;   VENTRICULAR ASSIST DEVICE INSERTION N/A 06/27/2023   Procedure: VENTRICULAR ASSIST DEVICE INSERTION;  Surgeon:  Cherrie Toribio SAUNDERS, MD;  Location: [facility] INVASIVE CV LAB;  Service: Cardiovascular;  Laterality: N/A;   WISDOM TOOTH EXTRACTION     Vitals BP (!) 138/122   Pulse 72   Temp 98.3 F (36.8 C) (Axillary)   Resp (!) 21   Ht 5' 3 (1.6 m)   Wt 78.1 kg   LMP 07/18/2011   SpO2 93%   BMI 30.50 kg/m      Physical Exam Constitutional:  adult female sitting up in the bed    Comments: NAD, HEENT wnl   Cardiovascular:     Rate and Rhythm: Normal rate and regular rhythm.     Heart sounds:  Pulmonary:     Effort: Pulmonary effort is normal.on [place]    Comments:   Abdominal:     Palpations: Abdomen is non distended  Tenderness:   Musculoskeletal:        General: No swelling or tenderness in peripheral joints  Skin:    Comments: no rashes, RT internal jugular temp pacer, RT arm PICC OK   Neurological:     General: awake, alert and oriented   Psychiatric:        Mood and Affect: Mood normal.   Pertinent Microbiology Results for orders placed or performed during the hospital encounter of 06/27/23  Culture, blood (routine x 2)     Status: None   Collection Time: 06/27/23 12:00 PM   Specimen: BLOOD LEFT HAND  Result Value Ref Range Status   Specimen Description BLOOD LEFT HAND  Final   Special Requests   Final    BOTTLES DRAWN AEROBIC ONLY Blood Culture adequate volume   Culture   Final    NO GROWTH 5 DAYS Performed at [facility], [address] [place], [place] [address]    Report Status 07/02/2023 FINAL  Final  MRSA Next Gen by PCR, Nasal     Status: None   Collection Time: 06/27/23  4:24 PM   Specimen: Nasal Mucosa; Nasal Swab  Result Value Ref Range Status   MRSA by PCR Next Gen NOT DETECTED NOT DETECTED Final    Comment: (NOTE) The GeneXpert MRSA Assay (FDA approved for NASAL specimens only), is one component of a comprehensive MRSA colonization surveillance program. It is not intended to diagnose MRSA infection nor to guide or monitor treatment for MRSA  infections. Test performance is not FDA approved in patients less than 35 years old. Performed at [facility] Lab, [address] [place], [place] [address]   Culture, blood (routine x 2)     Status: None   Collection Time: 06/27/23  5:32 PM   Specimen: BLOOD LEFT HAND  Result Value Ref Range Status   Specimen Description BLOOD LEFT HAND  Final   Special Requests   Final    BOTTLES DRAWN AEROBIC AND ANAEROBIC Blood Culture results may not be optimal due to an inadequate volume of blood received in culture bottles   Culture   Final    NO GROWTH 5 DAYS Performed at [facility], [address], [place], [place] [address]    Report Status 07/02/2023 FINAL  Final  Culture, blood (Routine X 2) w Reflex to ID Panel     Status: Abnormal   Collection Time: 07/04/23 10:07 AM   Specimen: BLOOD LEFT HAND  Result Value Ref Range Status   Specimen Description BLOOD LEFT HAND  Final   Special Requests   Final    BOTTLES DRAWN AEROBIC AND ANAEROBIC Blood Culture adequate volume   Culture  Setup Time   Final    GRAM POSITIVE COCCI IN CLUSTERS IN BOTH AEROBIC AND ANAEROBIC BOTTLES CRITICAL RESULT CALLED TO, READ BACK BY AND VERIFIED WITH: PHARMD J LEDFORD 07/05/2023 @ 0205 BY AB Performed at [facility] Lab, [address], [place], [place] [address]    Culture STAPHYLOCOCCUS EPIDERMIDIS (A)  Final   Report Status 07/07/2023 FINAL  Final   Organism ID, Bacteria STAPHYLOCOCCUS EPIDERMIDIS  Final      Susceptibility   Staphylococcus epidermidis - MIC*    CIPROFLOXACIN <=0.5 SENSITIVE Sensitive     ERYTHROMYCIN >=8 RESISTANT Resistant     GENTAMICIN <=0.5 SENSITIVE Sensitive     OXACILLIN <=0.25 SENSITIVE Sensitive     TETRACYCLINE <=1 SENSITIVE Sensitive     VANCOMYCIN 1 SENSITIVE Sensitive  TRIMETH /SULFA  >=320 RESISTANT Resistant     CLINDAMYCIN <=0.25 SENSITIVE Sensitive     RIFAMPIN <=0.5 SENSITIVE Sensitive     Inducible Clindamycin NEGATIVE Sensitive     *  STAPHYLOCOCCUS EPIDERMIDIS  Culture, blood (Routine X 2) w Reflex to ID Panel     Status: Abnormal   Collection Time: 07/04/23 10:07 AM   Specimen: BLOOD RIGHT HAND  Result Value Ref Range Status   Specimen Description BLOOD RIGHT HAND  Final   Special Requests   Final    BOTTLES DRAWN AEROBIC AND ANAEROBIC Blood Culture adequate volume   Culture  Setup Time   Final    GRAM POSITIVE COCCI IN CLUSTERS IN BOTH AEROBIC AND ANAEROBIC BOTTLES CRITICAL VALUE NOTED.  VALUE IS CONSISTENT WITH PREVIOUSLY REPORTED AND CALLED VALUE. Performed at [facility] Lab, [address], [place], [place] [address]    Culture STAPHYLOCOCCUS EPIDERMIDIS (A)  Final   Report Status 07/07/2023 FINAL  Final   Organism ID, Bacteria STAPHYLOCOCCUS EPIDERMIDIS  Final      Susceptibility   Staphylococcus epidermidis - MIC*    CIPROFLOXACIN <=0.5 SENSITIVE Sensitive     ERYTHROMYCIN >=8 RESISTANT Resistant     GENTAMICIN <=0.5 SENSITIVE Sensitive     OXACILLIN <=0.25 SENSITIVE Sensitive     TETRACYCLINE <=1 SENSITIVE Sensitive     VANCOMYCIN 1 SENSITIVE Sensitive     TRIMETH /SULFA  160 RESISTANT Resistant     CLINDAMYCIN <=0.25 SENSITIVE Sensitive     RIFAMPIN <=0.5 SENSITIVE Sensitive     Inducible Clindamycin NEGATIVE Sensitive     * STAPHYLOCOCCUS EPIDERMIDIS  Blood Culture ID Panel (Reflexed)     Status: Abnormal   Collection Time: 07/04/23 10:07 AM  Result Value Ref Range Status   Enterococcus faecalis NOT DETECTED NOT DETECTED Final   Enterococcus Faecium NOT DETECTED NOT DETECTED Final   Listeria monocytogenes NOT DETECTED NOT DETECTED Final   Staphylococcus species DETECTED (A) NOT DETECTED Final    Comment: CRITICAL RESULT CALLED TO, READ BACK BY AND VERIFIED WITH: PHARMD J LEDFORD 07/05/2023 @ 0205 BY AB    Staphylococcus aureus (BCID) NOT DETECTED NOT DETECTED Final   Staphylococcus epidermidis DETECTED (A) NOT DETECTED Final    Comment: CRITICAL RESULT CALLED TO, READ BACK BY AND VERIFIED  WITH: PHARMD J LEDFORD 07/05/2023 @ 0205 BY AB    Staphylococcus lugdunensis NOT DETECTED NOT DETECTED Final   Streptococcus species NOT DETECTED NOT DETECTED Final   Streptococcus agalactiae NOT DETECTED NOT DETECTED Final   Streptococcus pneumoniae NOT DETECTED NOT DETECTED Final   Streptococcus pyogenes NOT DETECTED NOT DETECTED Final   A.calcoaceticus-baumannii NOT DETECTED NOT DETECTED Final   Bacteroides fragilis NOT DETECTED NOT DETECTED Final   Enterobacterales NOT DETECTED NOT DETECTED Final   Enterobacter cloacae complex NOT DETECTED NOT DETECTED Final   Escherichia coli NOT DETECTED NOT DETECTED Final   Klebsiella aerogenes NOT DETECTED NOT DETECTED Final   Klebsiella oxytoca NOT DETECTED NOT DETECTED Final   Klebsiella pneumoniae NOT DETECTED NOT DETECTED Final   Proteus species NOT DETECTED NOT DETECTED Final   Salmonella species NOT DETECTED NOT DETECTED Final   Serratia marcescens NOT DETECTED NOT DETECTED Final   Haemophilus influenzae NOT DETECTED NOT DETECTED Final   Neisseria meningitidis NOT DETECTED NOT DETECTED Final   Pseudomonas aeruginosa NOT DETECTED NOT DETECTED Final   Stenotrophomonas maltophilia NOT DETECTED NOT DETECTED Final   Candida albicans NOT DETECTED NOT DETECTED Final   Candida auris NOT DETECTED NOT DETECTED Final   Candida  glabrata NOT DETECTED NOT DETECTED Final   Candida krusei NOT DETECTED NOT DETECTED Final   Candida parapsilosis NOT DETECTED NOT DETECTED Final   Candida tropicalis NOT DETECTED NOT DETECTED Final   Cryptococcus neoformans/gattii NOT DETECTED NOT DETECTED Final   Methicillin resistance mecA/C NOT DETECTED NOT DETECTED Final    Comment: Performed at [facility], [address] [place], [place] [address]  Culture, blood (Routine X 2) w Reflex to ID Panel     Status: None   Collection Time: 07/06/23  8:26 AM   Specimen: BLOOD LEFT ARM  Result Value Ref Range Status   Specimen Description BLOOD LEFT ARM  Final    Special Requests   Final    BOTTLES DRAWN AEROBIC AND ANAEROBIC Blood Culture adequate volume   Culture   Final    NO GROWTH 5 DAYS Performed at [facility], [address] [place], [place] [address]    Report Status 07/11/2023 FINAL  Final  Culture, blood (Routine X 2) w Reflex to ID Panel     Status: None   Collection Time: 07/06/23  8:26 AM   Specimen: BLOOD LEFT HAND  Result Value Ref Range Status   Specimen Description BLOOD LEFT HAND  Final   Special Requests   Final    BOTTLES DRAWN AEROBIC AND ANAEROBIC Blood Culture adequate volume   Culture   Final    NO GROWTH 5 DAYS Performed at [facility], [address] [place], [place] [address]    Report Status 07/11/2023 FINAL  Final   Pertinent Lab.    Latest Ref Rng & Units 07/11/2023    5:00 AM 07/10/2023    5:04 AM 07/09/2023    6:50 AM  CBC  WBC 4.0 - 10.5 K/uL 9.5  8.9  8.8   Hemoglobin 12.0 - 15.0 g/dL 89.9  9.3  9.2   Hematocrit 36.0 - 46.0 % 30.1  28.9  28.3   Platelets 150 - 400 K/uL 293  237  211       Latest Ref Rng & Units 07/11/2023    5:00 AM 07/10/2023    5:04 AM 07/09/2023    6:50 AM  CMP  Glucose 70 - 99 mg/dL 897  892  879   BUN 8 - 23 mg/dL 9  8  9    Creatinine 0.44 - 1.00 mg/dL 9.39  9.47  9.38   Sodium 135 - 145 mmol/L 131  132  128   Potassium 3.5 - 5.1 mmol/L 4.0  3.9  4.1   Chloride 98 - 111 mmol/L 95  96  94   CO2 22 - 32 mmol/L 29  28  29    Calcium  8.9 - 10.3 mg/dL 8.3  8.3  7.7    Pertinent Imaging today Plain films and CT images have been personally visualized and interpreted; radiology reports have been reviewed. Decision making incorporated into the Impression  No results found.  I spent 36  minutes involved in face-to-face and non-face-to-face activities for this patient on the day of the visit. Professional time spent includes the following activities: Preparing to see the patient (review of tests), Obtaining and reviewing separately obtained history (admission/discharge  record), Performing a medically appropriate examination and evaluation , Ordering medications/labs, referring and communicating with other health care professionals, Documenting clinical information in the EMR, Independently interpreting results (not separately reported), Communicating results to the patient/family/caregiver, Counseling and educating the patient/family/caregiver and Care coordination (not separately reported).  Plan d/w requesting provider as well as ID pharm D  Of note, portions of this note may have been created with voice recognition software. While this note has been edited for accuracy, occasional wrong-word or 'sound-a-like' substitutions may have occurred due to the inherent limitations of voice recognition software.   Electronically signed by:   Annalee Orem, MD Infectious Disease Physician [facility] Pager: [phone]

## 2023-07-11 NOTE — Telephone Encounter (Signed)
 Patient Product/process development scientist completed.    The patient is insured through [employer]/ [employer]. Patient has Medicare and is not eligible for a copay card, but may be able to apply for patient assistance or Medicare RX Payment Plan (Patient Must reach out to their plan, if eligible for payment plan), if available.    Ran test claim for linezolid 600 mg and the current 14 day co-pay is $100.00.   This test claim was processed through [facility]- copay amounts may vary at other pharmacies due to pharmacy/plan contracts, or as the patient moves through the different stages of their insurance plan.     Reyes Sharps, CPHT Pharmacy Technician III Certified Patient Advocate [facility] Patient Advocate Team Direct Number: [phone]  Fax: [phone]

## 2023-07-11 NOTE — CV Procedure (Addendum)
   TRANSESOPHAGEAL ECHOCARDIOGRAM   NAME:  Vicki [name]    MRN: [id number] DOB:  1950/07/07    ADMIT DATE: 06/27/2023  INDICATIONS: Rule out endocarditis  PROCEDURE:   Informed consent was obtained prior to the procedure. The risks, benefits and alternatives for the procedure were discussed and the patient comprehended these risks.  Risks include, but are not limited to, cough, sore throat, vomiting, nausea, somnolence, esophageal and stomach trauma or perforation, bleeding, low blood pressure, aspiration, pneumonia, infection, trauma to the teeth and death.    After a procedural time-out, the oropharynx was anesthetized and the patient was sedated by the anesthesia service. The transesophageal probe was inserted in the esophagus and stomach without difficulty and multiple views were obtained. Sedation by CCM.   COMPLICATIONS:    Complications: No complications Patient tolerated procedure well.  KEY FINDINGS:  Normal LV function, LVEF 55% Mildly dilated RV with mildly reduced function. PA mildly dilated.  No significant TR/MR/PI No vegetations or signs of endocarditis. Full report to follow  Stephine Langbehn Advanced Heart Failure 8:01 AM

## 2023-07-11 NOTE — Progress Notes (Signed)
 OT Cancellation Note  Patient Details Name: Vicki [name] MRN: [id number] DOB: Jan 01, 1951   Cancelled Treatment:    Reason Eval/Treat Not Completed: Patient declined, no reason specified (reports anxious on the inside doesnt know why and needs to nap.) Pt encouraged to sit in recliner position in the bed with RN (A) tonight and that therapy will reattempt tomorrow. She was given homework to reposition and take 4 bites minimum of food ( snack / dinner) to encourage po intake.   Ely Molt 07/11/2023, 2:23 PM

## 2023-07-12 DIAGNOSIS — R570 Cardiogenic shock: Secondary | ICD-10-CM | POA: Diagnosis not present

## 2023-07-12 LAB — MAGNESIUM: Magnesium: 2 mg/dL (ref 1.7–2.4)

## 2023-07-12 LAB — BASIC METABOLIC PANEL WITH GFR
Anion gap: 6 (ref 5–15)
BUN: 9 mg/dL (ref 8–23)
CO2: 29 mmol/L (ref 22–32)
Calcium: 8.7 mg/dL — ABNORMAL LOW (ref 8.9–10.3)
Chloride: 96 mmol/L — ABNORMAL LOW (ref 98–111)
Creatinine, Ser: 0.53 mg/dL (ref 0.44–1.00)
GFR, Estimated: >60 mL/min (ref >60)
Glucose, Bld: 93 mg/dL (ref 70–99)
Potassium: 3.7 mmol/L (ref 3.5–5.1)
Sodium: 131 mmol/L — ABNORMAL LOW (ref 135–145)

## 2023-07-12 LAB — CBC
HCT: 31 % — ABNORMAL LOW (ref 36.0–46.0)
Hemoglobin: 10.3 g/dL — ABNORMAL LOW (ref 12.0–15.0)
MCH: 30.7 pg (ref 26.0–34.0)
MCHC: 33.2 g/dL (ref 30.0–36.0)
MCV: 92.5 fL (ref 80.0–100.0)
Platelets: 296 10*3/uL (ref 150–400)
RBC: 3.35 MIL/uL — ABNORMAL LOW (ref 3.87–5.11)
RDW: 14 % (ref 11.5–15.5)
WBC: 9.2 10*3/uL (ref 4.0–10.5)
nRBC: 0 % (ref 0.0–0.2)

## 2023-07-12 LAB — HEPARIN LEVEL (UNFRACTIONATED)
Heparin Unfractionated: 0.85 [IU]/mL — ABNORMAL HIGH (ref 0.30–0.70)
Heparin Unfractionated: 0.46 [IU]/mL (ref 0.30–0.70)

## 2023-07-12 MED ORDER — POTASSIUM CHLORIDE CRYS ER 20 MEQ PO TBCR
40.0000 meq | EXTENDED_RELEASE_TABLET | Freq: Once | ORAL | Status: AC
  Administered 2023-07-12: 40 meq via ORAL
  Filled 2023-07-12: qty 2

## 2023-07-12 MED ORDER — HYDRALAZINE HCL 20 MG/ML IJ SOLN
5.0000 mg | Freq: Four times a day (QID) | INTRAMUSCULAR | Status: DC | PRN
  Administered 2023-07-12: 5 mg via INTRAVENOUS
  Filled 2023-07-12: qty 1

## 2023-07-12 MED ORDER — IRBESARTAN 300 MG PO TABS
300.0000 mg | ORAL_TABLET | Freq: Every day | ORAL | Status: DC
  Administered 2023-07-13 – 2023-07-14 (×2): 300 mg via ORAL
  Filled 2023-07-12 (×2): qty 1

## 2023-07-12 MED ORDER — HYDRALAZINE HCL 25 MG PO TABS
25.0000 mg | ORAL_TABLET | Freq: Four times a day (QID) | ORAL | Status: DC | PRN
  Administered 2023-07-12: 25 mg via ORAL
  Filled 2023-07-12 (×2): qty 1

## 2023-07-12 MED ORDER — CLEVIDIPINE BUTYRATE 0.5 MG/ML IV EMUL: 0.0000 mg/h | INTRAVENOUS | Status: DC

## 2023-07-12 MED ORDER — MELATONIN 5 MG PO TABS
5.0000 mg | ORAL_TABLET | Freq: Every evening | ORAL | Status: DC | PRN
  Administered 2023-07-12: 5 mg via ORAL
  Filled 2023-07-12 (×2): qty 1

## 2023-07-12 MED ORDER — NICARDIPINE HCL IN NACL 20-0.86 MG/200ML-% IV SOLN: 3.0000 mg/h | INTRAVENOUS | Status: DC

## 2023-07-12 MED ORDER — HYDRALAZINE HCL 25 MG PO TABS: 25.0000 mg | ORAL_TABLET | Freq: Once | ORAL | Status: DC

## 2023-07-12 MED ORDER — IRBESARTAN 150 MG PO TABS
150.0000 mg | ORAL_TABLET | Freq: Once | ORAL | Status: AC
  Administered 2023-07-12: 150 mg via ORAL
  Filled 2023-07-12: qty 1

## 2023-07-12 NOTE — Progress Notes (Signed)
 ANTICOAGULATION CONSULT NOTE  Pharmacy Consult for heparin >> bivalirudin  >>heparin  Indication: Impella RP >Afib  Allergies  Allergen Reactions   Codeine Other (See Comments)    headache   Crestor [Rosuvastatin] Other (See Comments)    Muscle aches   Other Nausea And Vomiting    Anesthesia-severe vomiting   Simvastatin Other (See Comments)    Muscle aches   Egg White (Egg Protein)     Other Reaction(s): Throat closes   Ezetimibe      Other Reaction(s): crippling muscle aches   Hydrocodone-Acetaminophen      Other Reaction(s): migraines   Peanut (Diagnostic)     Other Reaction(s): itchy   Shellfish Allergy Other (See Comments)    Throat swelling   Statins     Patient Measurements: Height: 5' 3 (160 cm) Weight: 72.6 kg (160 lb 0.9 oz) IBW/kg (Calculated) : 52.4 Heparin  Dosing Weight: 68 kg   Vital Signs: Temp: 98.4 F (36.9 C) (06/25 0339) Temp Source: Oral (06/25 0339) BP: 157/91 (06/25 0700) Pulse Rate: 80 (06/25 0700)  Labs: Recent Labs    07/10/23 0504 07/11/23 0500 07/12/23 0358  HGB 9.3* 10.0* 10.3*  HCT 28.9* 30.1* 31.0*  PLT 237 293 296  HEPARINUNFRC 0.66 0.70 0.85*  CREATININE 0.52 0.60 0.53    Estimated Creatinine Clearance: 60.7 mL/min (by C-G formula based on SCr of 0.53 mg/dL).   Assessment: 73 yo female in cardiogenic shock with RV failure s/p Impella RP 6/10.  Not on anticoagulation prior to admission.  Pharmacy consulted for heparin  dosing.  Chest CT w/ Type B dissection (small ulcerated plaque/developing intramural hematoma ~8 mm x 14 mm).   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  Discussed with team since HIT negative will change to heparin  infusion 6/17.   Heparin  level (0.85) above goal on 1200 units/hr.  Drawn from R hand, heparin  running through L PIV.  Hgb 10.3, pltc 296 - stable.  No s/sx of bleeding or infusion issues.   Goal of Therapy:  Heparin  level 0.3-0.7 units/ml Monitor platelets by anticoagulation protocol: Yes   Plan:   Decrease heparin  IV to 1100 units/hr  8 hour heparin  level Monitor daily HL, CBC, and for s/sx of bleeding  F/u anticoagulation after planned [facility] 6/26  Thank you for allowing pharmacy to participate in this patient's care,  Maurilio Fila, PharmD Clinical Pharmacist 07/12/2023  7:14 AM

## 2023-07-12 NOTE — TOC Progression Note (Signed)
 Transition of Care [facility]) - Progression Note    Patient Details  Name: Vicki [name] MRN: [id number] Date of Birth: [birth year]  Transition of [facility] CM/SW Contact  Justina Delcia Czar, RN Phone Number: [phone] 07/12/2023, 8:53 AM  Clinical Narrative:     TOC CM contacted [employer] rep, Pam RN, pt will need IV abx for 2 weeks.   Expected Discharge Plan: Home w Home Health Services Barriers to Discharge: Continued Medical Work up  Expected Discharge Plan and Services   Discharge Planning Services: CM Consult Post Acute Care Choice: Home Health Living arrangements for the past 2 months: Single Family Home                           HH Arranged: RN HH Agency: [employer] Date HH Agency Contacted: 07/12/23 Time HH Agency Contacted: [phone] Representative spoke with at [facility] Agency: Pam RN   Social Determinants of Health (SDOH) Interventions SDOH Screenings   Food Insecurity: No Food Insecurity (06/28/2023)  Housing: Low Risk  (06/28/2023)  Transportation Needs: No Transportation Needs (06/28/2023)  Utilities: Not At Risk (06/28/2023)  Social Connections: Socially Integrated (06/28/2023)  Tobacco Use: Medium Risk (06/27/2023)    Readmission Risk Interventions     No data to display

## 2023-07-12 NOTE — Progress Notes (Signed)
 Physical Therapy Treatment Patient Details Name: Vicki [name] MRN: [id number] DOB: 1950-04-26 Today's Date: 07/12/2023   History of Present Illness 73 y/o female presenting to the ED 06/27/23 w/ 3 day h/o severe chest pain. In cardiogenic shock due to RV failure, hypotensive, bradycardia. R and L heart cath; Chest CT ulcerated plaque in descending aorta; Impella 6/10-6/15; 6/18 arrhythmias requiring RIJ TVP. RIJ TVP removed 6/25.  PMH-chronic back pain, MI, HLD, HTN, migraines, TIA, CVAx3, osteopenia, PTSD,    PT Comments  Pt admitted with above diagnosis.MD pulled TVP prior to session.   Pt was able to ambulate with RW in room with min assist of 2 persons. Pt VSS.  Poor endurance overall and needs continued therapy.   Pt currently with functional limitations due to the deficits listed below (see PT Problem List). Pt will benefit from acute skilled PT to increase their independence and safety with mobility to allow discharge.       If plan is discharge home, recommend the following: A little help with walking and/or transfers;Assistance with cooking/housework;Direct supervision/assist for financial management;Direct supervision/assist for medications management;Help with stairs or ramp for entrance;Supervision due to cognitive status   Can travel by private vehicle        Equipment Recommendations  None recommended by PT    Recommendations for Other Services       Precautions / Restrictions Precautions Precautions: Fall Recall of Precautions/Restrictions: Impaired Restrictions Weight Bearing Restrictions Per Provider Order: No     Mobility  Bed Mobility Overal bed mobility: Needs Assistance Bed Mobility: Supine to Sit     Supine to sit: Contact guard     General bed mobility comments: 20 degree hob    Transfers Overall transfer level: Needs assistance Equipment used: 1 person hand held assist Transfers: Sit to/from Stand Sit to Stand: Min assist   Step pivot  transfers: Min assist       General transfer comment: Rt HHA to stand and step-pivot to 3N1.    Ambulation/Gait Ambulation/Gait assistance: Min assist, +2 safety/equipment Gait Distance (Feet): 15 Feet Assistive device: Rolling walker (2 wheels) Gait Pattern/deviations: Step-through pattern, Decreased stride length, Trunk flexed, Wide base of support, Drifts right/left   Gait velocity interpretation: <1.31 ft/sec, indicative of household ambulator   General Gait Details: Pt able to progress ambulation 15 feet and followed with chair. Pt needing cues to sequence steps and RW.   Stairs             Wheelchair Mobility     Tilt Bed    Modified Rankin (Stroke Patients Only)       Balance Overall balance assessment: Needs assistance Sitting-balance support: No upper extremity supported, Feet unsupported Sitting balance-Leahy Scale: Fair     Standing balance support: During functional activity, Bilateral upper extremity supported Standing balance-Leahy Scale: Poor Standing balance comment: relies on RW and external asupport                            Communication Communication Communication: No apparent difficulties  Cognition Arousal: Alert Behavior During Therapy: WFL for tasks assessed/performed                             Following commands: Intact      Cueing Cueing Techniques: Verbal cues  Exercises General Exercises - Lower Extremity Ankle Circles/Pumps: AROM, Both, 10 reps Quad Sets: AROM, Both, 10 reps  General Comments General comments (skin integrity, edema, etc.): HR 69-84 bpm, BP 135/91- 149/91 end of session, O2 91-96% on RA      Pertinent Vitals/Pain Pain Assessment Pain Assessment: No/denies pain    Home Living                          Prior Function            PT Goals (current goals can now be found in the care plan section) Acute Rehab PT Goals Patient Stated Goal: none stated Progress  towards PT goals: Progressing toward goals    Frequency    Min 2X/week      PT Plan      Co-evaluation PT/OT/SLP Co-Evaluation/Treatment: Yes Reason for Co-Treatment: Complexity of the patient's impairments (multi-system involvement) PT goals addressed during session: Mobility/safety with mobility;Balance OT goals addressed during session: ADL's and self-care      AM-PAC PT 6 Clicks Mobility   Outcome Measure  Help needed turning from your back to your side while in a flat bed without using bedrails?: A Little Help needed moving from lying on your back to sitting on the side of a flat bed without using bedrails?: A Little Help needed moving to and from a bed to a chair (including a wheelchair)?: A Little Help needed standing up from a chair using your arms (e.g., wheelchair or bedside chair)?: A Little Help needed to walk in hospital room?: Total Help needed climbing 3-5 steps with a railing? : Total 6 Click Score: 14    End of Session Equipment Utilized During Treatment: Gait belt Activity Tolerance: Patient limited by fatigue Patient left: with call bell/phone within reach;in chair;with chair alarm set Nurse Communication: Mobility status PT Visit Diagnosis: Difficulty in walking, not elsewhere classified (R26.2)     Time: 8946-8881 PT Time Calculation (min) (ACUTE ONLY): 25 min  Charges:    $Gait Training: 8-22 mins PT General Charges $$ ACUTE PT VISIT: 1 Visit                     Vicki [name],PT Acute Rehab Services [phone]    Vicki [name] 07/12/2023, 2:12 PM

## 2023-07-12 NOTE — Progress Notes (Signed)
 Advanced Heart Failure Rounding Note  HF Cardiologist: Rolan Fuel, MD Chief Complaint: Cardiogenic Shock Subjective:    NSR today No complaints; feels well but very weak.   Objective:    Vital Signs:   Temp:  [97.7 F (36.5 C)-98.5 F (36.9 C)] 97.7 F (36.5 C) (06/25 0800) Pulse Rate:  [69-80] 74 (06/25 0800) Resp:  [11-24] 16 (06/25 0800) BP: (125-175)/(75-122) 165/83 (06/25 0800) SpO2:  [93 %-100 %] 94 % (06/25 0800) Weight:  [72.6 kg] 72.6 kg (06/25 0339) Last BM Date : 07/12/23  Weight change: Filed Weights   07/10/23 0500 07/11/23 0500 07/12/23 0339  Weight: 75.2 kg 78.1 kg 72.6 kg   Intake/Output:  Intake/Output Summary (Last 24 hours) at 07/12/2023 0907 Last data filed at 07/12/2023 0800 Gross per 24 hour  Intake 587.46 ml  Output 1750 ml  Net -1162.54 ml   Vitals:   07/12/23 0700 07/12/23 0800  BP: (!) 157/91 (!) 165/83  Pulse: 80 74  Resp: 20 16  Temp:  97.7 F (36.5 C)  SpO2: 99% 94%   GENERAL: NAD Lungs- CTA CARDIAC:  JVP: 7 cm          Normal rate with regular rhythm. no murmur.  Pulses 2+. no edema.  ABDOMEN: Soft, non-tender, non-distended.  EXTREMITIES: Warm and well perfused.  NEUROLOGIC: No obvious FND   Labs: Basic Metabolic Panel: Recent Labs  Lab 07/08/23 0525 07/08/23 1007 07/09/23 0650 07/10/23 0504 07/11/23 0500 07/12/23 0358  NA 121* 129* 128* 132* 131* 131*  K 3.7 4.4 4.1 3.9 4.0 3.7  CL 81* 94* 94* 96* 95* 96*  CO2 23 30 29 28 29 29   GLUCOSE 533* 158* 120* 107* 102* 93  BUN 8 10 9 8 9 9   CREATININE 1.09* 0.67 0.61 0.52 0.60 0.53  CALCIUM  6.6* 8.3* 7.7* 8.3* 8.3* 8.7*  MG 1.8  --  2.2 1.9 2.1 2.0   Liver Function Tests: No results for input(s): AST, ALT, ALKPHOS, BILITOT, PROT, ALBUMIN in the last 168 hours.  CBC: Recent Labs  Lab 07/08/23 0525 07/09/23 0650 07/10/23 0504 07/11/23 0500 07/12/23 0358  WBC 8.9 8.8 8.9 9.5 9.2  HGB 8.5* 9.2* 9.3* 10.0* 10.3*  HCT 25.7* 28.3* 28.9* 30.1*  31.0*  MCV 95.5 96.3 97.3 94.4 92.5  PLT 197 211 237 293 296   BNP (last 3 results) Recent Labs    06/27/23 1020  BNP 543.0*   Medications:    Scheduled Medications:  aspirin  EC  81 mg Oral Daily   Chlorhexidine  Gluconate Cloth  6 each Topical Daily   feeding supplement  237 mL Oral BID BM   irbesartan   150 mg Oral Daily   levothyroxine   50 mcg Oral Daily   lidocaine   1 patch Transdermal Q24H   [START ON 07/21/2023] linezolid  600 mg Oral Q12H   multivitamin with minerals  1 tablet Oral Daily   pantoprazole   40 mg Oral Daily   senna  1 tablet Oral QHS   sertraline   100 mg Oral Daily   sodium chloride  flush  10-40 mL Intracatheter Q12H   sodium chloride  flush  3 mL Intravenous Q12H   sodium chloride  flush  3-10 mL Intravenous Q12H   torsemide   20 mg Oral Daily   Infusions:   ceFAZolin  (ANCEF ) IV Stopped (07/12/23 0616)   heparin  1,100 Units/hr (07/12/23 0841)   PRN Medications: sodium chloride , acetaminophen , lip balm, ondansetron  (ZOFRAN ) IV, mouth rinse, oxyCODONE , polyethylene glycol, sodium chloride  flush, sodium chloride  flush, sodium  chloride flush  Assessment/Plan:   Cardiogenic Shock, 2/2 Profound RV Failure --> Resolved - cath 6/10 normal left-dominant system with non-visualized tiny RCA - Echo LVEF 60-65% RV moderate HK with McConnell's sign - RHC 6/10 (on NE 15): RA 18, PA 35/21 (26), PCWP 19, CO/CI (TD) 2.9/1.6, PVR 0.8, PAPi 0.78 - CT w/o PE - s/p Impella RP placement. Removed 6/15. - POCUS ECHO 6/12 w/ improvement LVEF 60% RV mild HK  - 6/19 Echo No evidence of endocarditis. RV normal. LV 60-65%.  - Stopped dobutamine  on 6/20.  - Euvolemic on exam.  - Continue torsemide  20mg  daily  Acute diastolic HF - plan as above  NSTEMI with RV infarct: Resolved  - plan as above - continue ASA - no b-blocker with shock  Tachy-brady Syndrome - Previously in/out AF in 120-130s and acclerated junctional in 40s. - Now in sinus bradycardia, rates in the 30-40s.  Feels better paced - TEE - no endocarditis.  - Temp perm hopefully following to allow her to rehab - continue heparin  gtt while considering invasive procedures - Discussed case with EP today. NSR with very infrequent pauses. Will plan to remove TVP today. Hopeful to transfer to floor later today.    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   Thrombocytopenia - heparin  switched to bival on 6/13. PLTs stabilized/improved off heparin  - SRA negative - continue heparin  gtt - PLT within normal range.   Bacteremia: Line related source - Lines removed. Bcx 6/17 grew MSSE.  - WBC normal.  -- TEE no endocarditis.   -Per ID, Dr Ula- planning for 2 weeks IV cefazolin  then 2 weeks Linezolid . Duration is 4 weeks because of the ulcerated plaque in her aorta and want to be aggressive so that it does not become infected. She will need another PICC placed prior to d/c in the other arm ---> left.   -  6/19 blood cultures- NGT -EP following.    Hyperkalemia: resolved Shock Liver: resolved AKI: resolved  Hypertension  Irbesartan  150 mg daily was ordered but not given. Discussed with staff.      Length of Stay: 15   Demetrion Wesby 10:46 AM   Advanced Heart Failure Team Pager [phone] (M-F; 7a - 5p)   CRITICAL CARE Performed by: Ria Commander   Total critical care time: 43 minutes  Critical care time was exclusive of separately billable procedures and treating other patients.  Critical care was necessary to treat or prevent imminent or life-threatening deterioration.  Critical care was time spent personally by me on the following activities: development of treatment plan with patient and/or surrogate as well as nursing, discussions with consultants, evaluation of patient's response to  treatment, examination of patient, obtaining history from patient or surrogate, ordering and performing treatments and interventions, ordering and review of laboratory studies, ordering and review of radiographic studies, pulse oximetry and re-evaluation of patient's condition.

## 2023-07-12 NOTE — Progress Notes (Signed)
   Temp pacer removed by Dr Gardenia.    Transfer to 3 east today.   Heart failure team will sign off as of 07/12/23. Transfer to General Cardiology.   Follow up as an outpatient in the HF clinic ?  No  If no please follow up with [facility] after discharge.  Disha Cottam NP-C  11:17 AM

## 2023-07-12 NOTE — Progress Notes (Addendum)
 Patient Name: NYJAI GRAFF Date of Encounter: 07/12/2023  Primary Cardiologist: None Electrophysiologist: None  Interval Summary   Patient reports she is anxious to get the tape off her neck.  No acute concerns. Husband asks about PPM / plan moving forward.   Vital Signs    Vitals:   07/12/23 0500 07/12/23 0600 07/12/23 0700 07/12/23 0800  BP: (!) 157/101 (!) 166/89 (!) 157/91 (!) 165/83  Pulse: 73 73 80 74  Resp: 18 12 20 16   Temp:      TempSrc:      SpO2: 99% 100% 99% 94%  Weight:      Height:        Intake/Output Summary (Last 24 hours) at 07/12/2023 0838 Last data filed at 07/12/2023 0800 Gross per 24 hour  Intake 587.46 ml  Output 1750 ml  Net -1162.54 ml   Filed Weights   07/10/23 0500 07/11/23 0500 07/12/23 0339  Weight: 75.2 kg 78.1 kg 72.6 kg    Physical Exam    GEN- adult female lying in bed in NAD, alert and oriented x 3 today.   Lungs- Clear to ausculation bilaterally, normal work of breathing Cardiac- Regular rate and rhythm, no murmurs, rubs or gallops GI- soft, NT, ND, + BS Extremities- no clubbing or cyanosis. No edema  Telemetry    SR 60-70's, PVC's > no significant pacing burden (personally reviewed)  Hospital Course    REGGIE WELGE is a 73 y.o. female with PMH of HTN, HLD, tobacco abuse admitted 06/27/23 for malaise, weakness & chest pain in setting of NSTEMI and RV failure. Had new AF on 06/25/23 at [facility] but refused ER transfer. Emergent R/LHC with no significant CAD or culprit lesion, but unable to angiographically identify RCA (seen as small on RHC 2017), severe RV failure s/p Impella. 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 Impella removed 07/02/23, continued to require vasopressors. Shock liver, AKI improved. ON 6/17 noted to have fever to 102, leukocytosis. BCx4 positive for staph EPI. ID following, on abx. Continued  to have intermittent bradycardia despite vasopressors, temp wire placed 07/05/23 in RIJ.  Conduction has been improving.   Assessment & Plan    SND with possible Sinus Arrest  Junctional Rhythm  Tachy-Brady  -conduction appears to be improved > SR with occ sinus pause / longest is 2 sec, no hard indication for PPM -ok to pull TVP from EP standpoint and keep on tele -TVP at VVI 15 bpm, 35mA  -will plan for live monitor at discharge  -given infectious concerns, hopeful to avoid PPM if possible   Acute RV Failure Acute Diastolic HF  NSTEMI with RV Infarct  -per primary   Paroxysmal AF  -heparin  gtt per pharmacy  -await transition to DOAC pending decision for PPM   Staph EPI Bacteremia Negative TEE 07/10/23 for vegetation  -ID following     For questions or updates, please contact [facility] Please consult [email or web address] for contact info under     Signed, Daphne Barrack, NP-C, AGACNP-BC [facility] - Electrophysiology  07/12/2023, 8:49 AM  I have seen, examined the patient, and reviewed the above assessment and plan.    Interval:  No acute overnight events. Patient reports feeling relatively well. No new or acute complaints.   General: Well developed, in no acute distress.  Neck: No JVD.  Cardiac: Normal rate, regular rhythm.  Resp:  Normal work of breathing.  Ext: No edema.  Neuro: No gross focal deficits.  Psych: Normal affect.   Assessment and Plan:  #. Bradycardia: In the setting of her acute illness patient developed inappropriate bradycardia.  Temporary wire was placed.  After stopping dobutamine  she had increase in temporary usage.  Bradycardia appears to be mainly sinus node dysfunction.  With time it appears that sinus node has recovered.  Today she continues to conduct one-to-one at normal rates.  Overall, appears to have recovered.  She has had no real pacing needs for past 48 hours. #. Sinus node dysfunction: Rare sinus pauses, less than 2  seconds in duration.  Appear to be asymptomatic. - Discontinue right IJ temporary pacer. - We will continue to evaluate telemetry daily.  I have not seen evidence of conduction disease. This appears to be isolated sinus node dysfunction that is improving. Query if she has occluded RCA as the etiology and potential sinus node ischemia. No hard indication for permanent pacer implant at this time unless she develops symptoms from sinus node dysfunction.  Given bacteremia and need for prolonged IV antibiotics, would like to avoid pacer implant if possible.   #. Paroxysmal atrial fibrillation:  - Continue heparin .    #. Staph bacteremia: -Appreciate ID assistance   Fonda Kitty, MD 07/12/2023 9:38 PM

## 2023-07-12 NOTE — Progress Notes (Signed)
 ANTICOAGULATION CONSULT NOTE  Pharmacy Consult for heparin >> bivalirudin  >>heparin  Indication: Impella RP >Afib  Allergies  Allergen Reactions   Codeine Other (See Comments)    headache   Crestor [Rosuvastatin] Other (See Comments)    Muscle aches   Other Nausea And Vomiting    Anesthesia-severe vomiting   Simvastatin Other (See Comments)    Muscle aches   Egg White (Egg Protein)     Other Reaction(s): Throat closes   Ezetimibe      Other Reaction(s): crippling muscle aches   Hydrocodone-Acetaminophen      Other Reaction(s): migraines   Peanut (Diagnostic)     Other Reaction(s): itchy   Shellfish Allergy Other (See Comments)    Throat swelling   Statins     Patient Measurements: Height: 5' 3 (160 cm) Weight: 72.6 kg (160 lb 0.9 oz) IBW/kg (Calculated) : 52.4 Heparin  Dosing Weight: 68 kg   Vital Signs: Temp: 97.7 F (36.5 C) (06/25 1130) Temp Source: Axillary (06/25 1130) BP: 180/87 (06/25 1600) Pulse Rate: 80 (06/25 1600)  Labs: Recent Labs    07/10/23 0504 07/11/23 0500 07/12/23 0358 07/12/23 1513  HGB 9.3* 10.0* 10.3*  --   HCT 28.9* 30.1* 31.0*  --   PLT 237 293 296  --   HEPARINUNFRC 0.66 0.70 0.85* 0.46  CREATININE 0.52 0.60 0.53  --     Estimated Creatinine Clearance: 60.7 mL/min (by C-G formula based on SCr of 0.53 mg/dL).   Assessment: 73 yo female in cardiogenic shock with RV failure s/p Impella RP 6/10.  Not on anticoagulation prior to admission.  Pharmacy consulted for heparin  dosing.  Chest CT w/ Type B dissection (small ulcerated plaque/developing intramural hematoma ~8 mm x 14 mm).   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  Discussed with team since HIT negative will change to heparin  infusion 6/17.   Heparin  level (0.85) above goal on 1200 units/hr.  Drawn from R hand, heparin  running through L PIV.  Hgb 10.3, pltc 296 - stable.  No s/sx of bleeding or infusion issues.   PM: heparin  level is therapeutic at 0.46 on 1100 units/hr. No issues with  the infusion or bleeding reported per RN.  Goal of Therapy:  Heparin  level 0.3-0.7 units/ml Monitor platelets by anticoagulation protocol: Yes   Plan:  Continue heparin  IV at 1100 units/hr  8 hour confirmatory heparin  level Monitor daily HL, CBC, and for s/sx of bleeding  F/u anticoagulation after planned [facility] 6/26  Thank you for allowing pharmacy to participate in this patient's care,  Rocky Slade, PharmD, BCPS Clinical Pharmacist 07/12/2023  4:45 PM  Please check AMION for all [facility] phone numbers After 10:00 PM, call Main Pharmacy [phone]

## 2023-07-12 NOTE — Progress Notes (Signed)
 Occupational Therapy Treatment Patient Details Name: Vicki [name] MRN: [id number] DOB: [birth year] Today's Date: 07/12/2023   History of present illness 73 y/o female presenting to the ED 06/27/23 w/ 3 day h/o severe chest pain. In cardiogenic shock due to RV failure, hypotensive, bradycardia. R and L heart cath; Chest CT ulcerated plaque in descending aorta; Impella 6/10-6/15; 6/18 arrhythmias requiring RIJ TVP  PMH-chronic back pain, MI, HLD, HTN, migraines, TIA, CVAx3, osteopenia, PTSD,   OT comments  Pt progressed from OOB to chair and then short ambulation with RW. Pt with DOE and educated on pursed lip breathing. Pt motivated to progress with therapy . Pt benefits from small homework assignments and motivate. Pt could benefit from medbridge exercises next session. Recommendation with HHOT pending further progress.       If plan is discharge home, recommend the following:  A little help with walking and/or transfers;A lot of help with bathing/dressing/bathroom;Assistance with cooking/housework;Direct supervision/assist for medications management;Direct supervision/assist for financial management;Assist for transportation;Help with stairs or ramp for entrance   Equipment Recommendations  BSC/3in1    Recommendations for Other Services      Precautions / Restrictions Precautions Precautions: Fall Recall of Precautions/Restrictions: Impaired Restrictions Weight Bearing Restrictions Per Provider Order: No       Mobility Bed Mobility Overal bed mobility: Needs Assistance Bed Mobility: Supine to Sit     Supine to sit: Contact guard     General bed mobility comments: 20 degree hob    Transfers Overall transfer level: Needs assistance Equipment used: 1 person hand held assist Transfers: Sit to/from Stand Sit to Stand: Min assist     Step pivot transfers: Min assist           Balance                                           ADL either performed  or assessed with clinical judgement   ADL Overall ADL's : Needs assistance/impaired Eating/Feeding: Set up;Sitting   Grooming: Set up;Sitting                   Toilet Transfer: Minimal assistance;BSC/3in1 (face to face)   Toileting- Clothing Manipulation and Hygiene: Moderate assistance Toileting - Clothing Manipulation Details (indicate cue type and reason): provided a mesh panty with pad to help with incontinence during transfers     Functional mobility during ADLs: Minimal assistance;Rolling walker (2 wheels) General ADL Comments: motivated and agreeable to OOB. pt progressed from bed to 3n1 and then ambulated to the door. pt with leaking purewick in bed surface. pt with steady incontinence    Extremity/Trunk Assessment              Vision       Perception     Praxis     Communication Communication Communication: No apparent difficulties   Cognition Arousal: Alert Behavior During Therapy: WFL for tasks assessed/performed Cognition: No apparent impairments             OT - Cognition Comments: recalled homework and completed it                 Following commands: Intact        Cueing   Cueing Techniques: Verbal cues  Exercises      Shoulder Instructions       General Comments HR 75 95% on RA with movement HR  84. pt DOE 3 out 4 educated on pursed lip breathing    Pertinent Vitals/ Pain       Pain Assessment Pain Assessment: No/denies pain  Home Living                                          Prior Functioning/Environment              Frequency  Min 2X/week        Progress Toward Goals  OT Goals(current goals can now be found in the care plan section)  Progress towards OT goals: Progressing toward goals  Acute Rehab OT Goals OT Goal Formulation: With patient Time For Goal Achievement: 07/17/23 Potential to Achieve Goals: Good ADL Goals Pt Will Perform Grooming: with supervision;standing Pt Will  Perform Upper Body Dressing: with set-up;sitting Pt Will Perform Lower Body Dressing: with supervision;sit to/from stand Pt Will Transfer to Toilet: with supervision;ambulating;regular height toilet Pt Will Perform Toileting - Clothing Manipulation and hygiene: with supervision;sit to/from stand Additional ADL Goal #1: Pt will participate in formal cognitive assessment.  Plan      Co-evaluation    PT/OT/SLP Co-Evaluation/Treatment: Yes Reason for Co-Treatment: Complexity of the patient's impairments (multi-system involvement)   OT goals addressed during session: ADL's and self-care      AM-PAC OT 6 Clicks Daily Activity     Outcome Measure   Help from another person eating meals?: None Help from another person taking care of personal grooming?: A Little Help from another person toileting, which includes using toliet, bedpan, or urinal?: A Lot Help from another person bathing (including washing, rinsing, drying)?: A Lot Help from another person to put on and taking off regular upper body clothing?: A Little Help from another person to put on and taking off regular lower body clothing?: A Lot 6 Click Score: 16    End of Session Equipment Utilized During Treatment: Rolling walker (2 wheels)  OT Visit Diagnosis: Unsteadiness on feet (R26.81);Muscle weakness (generalized) (M62.81);Other symptoms and signs involving cognitive function   Activity Tolerance Patient tolerated treatment well   Patient Left in chair;with call bell/phone within reach   Nurse Communication Mobility status        Time: 8946-8881 OT Time Calculation (min): 25 min  Charges: OT General Charges $OT Visit: 1 Visit OT Treatments $Self Care/Home Management : 8-22 mins   Brynn, OTR/L  Acute Rehabilitation Services Office: [phone] .   Ely Molt 07/12/2023, 1:13 PM

## 2023-07-12 NOTE — Progress Notes (Signed)
 Nutrition Follow-up  DOCUMENTATION CODES:   Obesity unspecified  INTERVENTION:   Liberalize diet to REGULAR until pt appetite and po intake returns to baseline -Ok for family to bring in outside food, especially if this will encourage pt to eat more  Add Protein-Containing Snacks BID between meals  Continue MVI with Minerals  D/C Ensure as pt not drinking   NUTRITION DIAGNOSIS:   Increased nutrient needs related to acute illness as evidenced by estimated needs.  Being addressed   GOAL:   Patient will meet greater than or equal to 90% of their needs  Progressing  MONITOR:   PO intake, Labs, Weight trends, I & O's  REASON FOR ASSESSMENT:   Consult Assessment of nutrition requirement/status, Poor PO  ASSESSMENT:   Pt with history of  normal cors on cardiac cath in 2017 and h/o HLD, presenting  with 3 day PTA history of severe chest pain. Found to be in cardiogenic shock w/ hypotension and bradycardia.  Pt reports eating a little bit better in the last 24 hours. Pt did not eat breakfast as she was NPO but ate 100% of yogurt and some vegetable soup at lunch today. Pt reports she ate 75% of chicken caesar salad last night. Per recorded po intake, po intake has improved over the last few days.   Pt reports her intake had been poor for several days recently due to diarrhea, intermittent nausea. This has now resolved.   Pt not taking Ensure as she reports she did not tolerate them, caused upset tummy/diarrhea.   At home, pt is not a big eater per pt and husband. Pt does not eat anything in the morning, drinks coffee all throughout the day. First meal of the day is around 2/3 pm. Pt also eats something in the evening. Per husband, pt is a bird eater. Only eats small amounts. Discussed addition of small snacks between meals, pt agreeable to this.   Weight down to 72.6 kg; admission wt 72.6 kg (appears stated however) with weight of 75.2 kg the following day.    Labs: Sodium 131 (L) BUN wdl Creatinine wdl Potassium 3.7 (wdl) Magnesium  2.0 (Wdl)  Meds:  MVI with Minerals  Protonix  Senna Miralax  Torsemide    Diet Order:   Diet Order             Diet regular Room service appropriate? Yes with Assist; Fluid consistency: Thin  Diet effective now                   EDUCATION NEEDS:   No education needs have been identified at this time  Skin:  Skin Assessment: Reviewed RN Assessment  Last BM:  6/25  Height:   Ht Readings from Last 1 Encounters:  06/29/23 5' 3 (1.6 m)    Weight:   Wt Readings from Last 1 Encounters:  07/12/23 72.6 kg    Ideal Body Weight:  52.3 kg  BMI:  Body mass index is 28.35 kg/m.  Estimated Nutritional Needs:   Kcal:  1650-1850  Protein:  85-100 grams  Fluid:  1.6-1.8 L   Betsey Finger MS, RDN, LDN, CNSC Registered Dietitian 3 Clinical Nutrition RD Inpatient Contact Info in Amion

## 2023-07-13 ENCOUNTER — Other Ambulatory Visit (HOSPITAL_COMMUNITY): Payer: Self-pay

## 2023-07-13 ENCOUNTER — Telehealth: Payer: Self-pay | Admitting: Pulmonary Disease

## 2023-07-13 ENCOUNTER — Telehealth (HOSPITAL_COMMUNITY): Payer: Self-pay | Admitting: Pharmacy Technician

## 2023-07-13 ENCOUNTER — Other Ambulatory Visit: Payer: Self-pay

## 2023-07-13 DIAGNOSIS — R001 Bradycardia, unspecified: Secondary | ICD-10-CM

## 2023-07-13 DIAGNOSIS — I495 Sick sinus syndrome: Secondary | ICD-10-CM | POA: Diagnosis not present

## 2023-07-13 DIAGNOSIS — R570 Cardiogenic shock: Secondary | ICD-10-CM | POA: Diagnosis not present

## 2023-07-13 LAB — BASIC METABOLIC PANEL WITH GFR
Anion gap: 9 (ref 5–15)
BUN: 9 mg/dL (ref 8–23)
CO2: 28 mmol/L (ref 22–32)
Calcium: 8.9 mg/dL (ref 8.9–10.3)
Chloride: 96 mmol/L — ABNORMAL LOW (ref 98–111)
Creatinine, Ser: 0.5 mg/dL (ref 0.44–1.00)
GFR, Estimated: >60 mL/min (ref >60)
Glucose, Bld: 98 mg/dL (ref 70–99)
Potassium: 3.6 mmol/L (ref 3.5–5.1)
Sodium: 133 mmol/L — ABNORMAL LOW (ref 135–145)

## 2023-07-13 LAB — CBC
HCT: 31.8 % — ABNORMAL LOW (ref 36.0–46.0)
Hemoglobin: 10.5 g/dL — ABNORMAL LOW (ref 12.0–15.0)
MCH: 30.6 pg (ref 26.0–34.0)
MCHC: 33 g/dL (ref 30.0–36.0)
MCV: 92.7 fL (ref 80.0–100.0)
Platelets: 290 10*3/uL (ref 150–400)
RBC: 3.43 MIL/uL — ABNORMAL LOW (ref 3.87–5.11)
RDW: 13.8 % (ref 11.5–15.5)
WBC: 8.8 10*3/uL (ref 4.0–10.5)
nRBC: 0 % (ref 0.0–0.2)

## 2023-07-13 LAB — MAGNESIUM: Magnesium: 1.8 mg/dL (ref 1.7–2.4)

## 2023-07-13 LAB — HEPARIN LEVEL (UNFRACTIONATED)
Heparin Unfractionated: 0.48 [IU]/mL (ref 0.30–0.70)
Heparin Unfractionated: 0.6 [IU]/mL (ref 0.30–0.70)

## 2023-07-13 MED ORDER — APIXABAN 5 MG PO TABS
5.0000 mg | ORAL_TABLET | Freq: Two times a day (BID) | ORAL | Status: DC
  Administered 2023-07-13 – 2023-07-14 (×3): 5 mg via ORAL
  Filled 2023-07-13 (×3): qty 1

## 2023-07-13 MED ORDER — MAGNESIUM SULFATE 2 GM/50ML IV SOLN
2.0000 g | Freq: Once | INTRAVENOUS | Status: AC
  Administered 2023-07-13: 2 g via INTRAVENOUS
  Filled 2023-07-13: qty 50

## 2023-07-13 MED ORDER — POTASSIUM CHLORIDE CRYS ER 20 MEQ PO TBCR
40.0000 meq | EXTENDED_RELEASE_TABLET | Freq: Once | ORAL | Status: AC
  Administered 2023-07-13: 40 meq via ORAL
  Filled 2023-07-13: qty 2

## 2023-07-13 MED ORDER — SPIRONOLACTONE 25 MG PO TABS
25.0000 mg | ORAL_TABLET | Freq: Every day | ORAL | Status: DC
  Administered 2023-07-13 – 2023-07-14 (×2): 25 mg via ORAL
  Filled 2023-07-13 (×2): qty 1

## 2023-07-13 NOTE — Telephone Encounter (Signed)
 Patient Product/process development scientist completed.    The patient is insured through [employer]/ [employer]. Patient has Medicare and is not eligible for a copay card, but may be able to apply for patient assistance or Medicare RX Payment Plan (Patient Must reach out to their plan, if eligible for payment plan), if available.    Ran test claim for Eliquis 5 mg and the current 30 day co-pay is $47.00.   This test claim was processed through [facility]- copay amounts may vary at other pharmacies due to pharmacy/plan contracts, or as the patient moves through the different stages of their insurance plan.     Vicki [name], CPHT Pharmacy Technician III Certified Patient Advocate [facility] Patient Advocate Team Direct Number: [phone]  Fax: [phone]

## 2023-07-13 NOTE — Progress Notes (Signed)
 Physical Therapy Treatment Patient Details Name: Vicki [name] MRN: [id number] DOB: December 08, 1950 Today's Date: 07/13/2023   History of Present Illness 73 y/o female presenting to the ED 06/27/23 w/ 3 day h/o severe chest pain. In cardiogenic shock due to RV failure, hypotensive, bradycardia. R and L heart cath; Chest CT ulcerated plaque in descending aorta; Impella 6/10-6/15; 6/18 arrhythmias requiring RIJ TVP. RIJ TVP removed 6/25.  PMH-chronic back pain, MI, HLD, HTN, migraines, TIA, CVAx3, osteopenia, PTSD.    PT Comments  Making good progress towards functional goals. Tolerated increased ambulatory distance up to 80 feet with RW and CGA for safety. Nauseated upon returning to room and sitting down. SpO2 94-100% on RA with good waveform, but reports moderate SOB. BP 134/77 with no significant drop after getting out of bed to chair. Patient will continue to benefit from skilled physical therapy services to further improve independence with functional mobility.' Encouraged LE elevation, ankle pumps and quad sets for edema. Ambulate with staff several times per day as tolerated. RN reports pt has been able to walk to rest room with staff.    If plan is discharge home, recommend the following: A little help with walking and/or transfers;Assistance with cooking/housework;Direct supervision/assist for financial management;Direct supervision/assist for medications management;Help with stairs or ramp for entrance;Supervision due to cognitive status   Can travel by private vehicle        Equipment Recommendations  None recommended by PT    Recommendations for Other Services       Precautions / Restrictions Precautions Precautions: Fall Recall of Precautions/Restrictions: Impaired Restrictions Weight Bearing Restrictions Per Provider Order: No     Mobility  Bed Mobility               General bed mobility comments: Standing with RN upon my entry    Transfers Overall transfer level:  Needs assistance Equipment used: Rolling walker (2 wheels) Transfers: Sit to/from Stand Sit to Stand: Contact guard assist           General transfer comment: Assisted to chair, pt already standing with RN when PT entered room. Cues for alignment and to reach back for arm rests. Good control. able to scoot back with cues.    Ambulation/Gait Ambulation/Gait assistance: Contact guard assist Gait Distance (Feet): 80 Feet Assistive device: Rolling walker (2 wheels) Gait Pattern/deviations: Step-through pattern, Decreased stride length, Drifts right/left Gait velocity: dec Gait velocity interpretation: <1.31 ft/sec, indicative of household ambulator   General Gait Details: CGA for safety, slow and guarded, cues for RW placement to proximity for maximum support. No overt buckling noted. Reports moderate SOB without notable increase in WOB, SpO2 94-100% on RA. Reports significant fatigue towards end of distance.   Stairs             Wheelchair Mobility     Tilt Bed    Modified Rankin (Stroke Patients Only)       Balance Overall balance assessment: Needs assistance Sitting-balance support: No upper extremity supported, Feet unsupported Sitting balance-Leahy Scale: Fair     Standing balance support: During functional activity, Bilateral upper extremity supported Standing balance-Leahy Scale: Poor Standing balance comment: relies on RW and external asupport                            Communication Communication Communication: No apparent difficulties  Cognition Arousal: Alert Behavior During Therapy: WFL for tasks assessed/performed   PT - Cognitive impairments: No family/caregiver present to determine  baseline                         Following commands: Intact Following commands impaired: Only follows one step commands consistently    Cueing Cueing Techniques: Verbal cues  Exercises General Exercises - Lower Extremity Ankle Circles/Pumps:  AROM, Both, 10 reps, Seated Quad Sets: Both, 10 reps, Strengthening, Seated    General Comments General comments (skin integrity, edema, etc.): VSS on RA> Pt with nausea upon sitting down, provided emesis bag but nothing produced.      Pertinent Vitals/Pain Pain Assessment Pain Assessment: No/denies pain    Home Living                          Prior Function            PT Goals (current goals can now be found in the care plan section) Acute Rehab PT Goals Patient Stated Goal: get well return home PT Goal Formulation: With patient Time For Goal Achievement: 07/17/23 Potential to Achieve Goals: Good Progress towards PT goals: Progressing toward goals    Frequency    Min 2X/week      PT Plan      Co-evaluation              AM-PAC PT 6 Clicks Mobility   Outcome Measure  Help needed turning from your back to your side while in a flat bed without using bedrails?: A Little Help needed moving from lying on your back to sitting on the side of a flat bed without using bedrails?: A Little Help needed moving to and from a bed to a chair (including a wheelchair)?: A Little Help needed standing up from a chair using your arms (e.g., wheelchair or bedside chair)?: A Little Help needed to walk in hospital room?: A Little Help needed climbing 3-5 steps with a railing? : Total 6 Click Score: 16    End of Session Equipment Utilized During Treatment: Gait belt Activity Tolerance: Patient tolerated treatment well Patient left: with call bell/phone within reach;in chair;with chair alarm set Nurse Communication: Mobility status PT Visit Diagnosis: Difficulty in walking, not elsewhere classified (R26.2)     Time: 1011-1030 PT Time Calculation (min) (ACUTE ONLY): 19 min  Charges:    $Gait Training: 8-22 mins PT General Charges $$ ACUTE PT VISIT: 1 Visit                     Leontine Roads, PT, DPT [facility]  Rehabilitation Services Physical  Therapist Office: [phone] Website: [email or web address]    Leontine GORMAN Roads 07/13/2023, 11:46 AM

## 2023-07-13 NOTE — Progress Notes (Addendum)
 Advanced Heart Failure Rounding Note  HF Cardiologist: Rolan Fuel, MD Chief Complaint: Heart Failure  Subjective:    Anxious about her hospitalization and going home. Denies SOB.    Objective:    Vital Signs:   Temp:  [97.7 F (36.5 C)-98.4 F (36.9 C)] 98.3 F (36.8 C) (06/26 0325) Pulse Rate:  [71-87] 78 (06/26 0900) Resp:  [11-28] 18 (06/26 0900) BP: (126-197)/(69-133) 146/84 (06/26 0900) SpO2:  [93 %-98 %] 97 % (06/26 0900) Weight:  [73 kg] 73 kg (06/26 0354) Last BM Date : 07/13/23  Weight change: Filed Weights   07/11/23 0500 07/12/23 0339 07/13/23 0354  Weight: 78.1 kg 72.6 kg 73 kg   Intake/Output:  Intake/Output Summary (Last 24 hours) at 07/13/2023 0954 Last data filed at 07/13/2023 0900 Gross per 24 hour  Intake 804.33 ml  Output 1000 ml  Net -195.67 ml   Vitals:   07/13/23 0805 07/13/23 0900  BP: (!) 142/78 (!) 146/84  Pulse: 80 78  Resp: (!) 22 18  Temp:    SpO2: 96% 97%   General:   No resp difficulty Neck: supple. no JVD.  Cor: PMI nondisplaced. Regular rate & rhythm. No rubs, gallops or murmurs. Lungs: clear Abdomen: soft, nontender, nondistended.  Extremities: no cyanosis, clubbing, rash, edema Neuro: alert & oriented x3   SR   Labs: Basic Metabolic Panel: Recent Labs  Lab 07/09/23 0650 07/10/23 0504 07/11/23 0500 07/12/23 0358 07/13/23 0308  NA 128* 132* 131* 131* 133*  K 4.1 3.9 4.0 3.7 3.6  CL 94* 96* 95* 96* 96*  CO2 29 28 29 29 28   GLUCOSE 120* 107* 102* 93 98  BUN 9 8 9 9 9   CREATININE 0.61 0.52 0.60 0.53 0.50  CALCIUM  7.7* 8.3* 8.3* 8.7* 8.9  MG 2.2 1.9 2.1 2.0 1.8   Liver Function Tests: No results for input(s): AST, ALT, ALKPHOS, BILITOT, PROT, ALBUMIN in the last 168 hours.  CBC: Recent Labs  Lab 07/09/23 0650 07/10/23 0504 07/11/23 0500 07/12/23 0358 07/13/23 0308  WBC 8.8 8.9 9.5 9.2 8.8  HGB 9.2* 9.3* 10.0* 10.3* 10.5*  HCT 28.3* 28.9* 30.1* 31.0* 31.8*  MCV 96.3 97.3 94.4 92.5 92.7   PLT 211 237 293 296 290   BNP (last 3 results) Recent Labs    06/27/23 1020  BNP 543.0*   Medications:    Scheduled Medications:  aspirin  EC  81 mg Oral Daily   Chlorhexidine  Gluconate Cloth  6 each Topical Daily   irbesartan   300 mg Oral Daily   levothyroxine   50 mcg Oral Daily   lidocaine   1 patch Transdermal Q24H   [START ON 07/21/2023] linezolid  600 mg Oral Q12H   multivitamin with minerals  1 tablet Oral Daily   pantoprazole   40 mg Oral Daily   senna  1 tablet Oral QHS   sertraline   100 mg Oral Daily   sodium chloride  flush  10-40 mL Intracatheter Q12H   sodium chloride  flush  3 mL Intravenous Q12H   torsemide   20 mg Oral Daily   Infusions:   ceFAZolin  (ANCEF ) IV Stopped (07/13/23 0531)   heparin  1,100 Units/hr (07/13/23 0900)   niCARDipine     PRN Medications: acetaminophen , hydrALAZINE, hydrALAZINE, lip balm, melatonin, ondansetron  (ZOFRAN ) IV, mouth rinse, oxyCODONE , polyethylene glycol, sodium chloride  flush, sodium chloride  flush  Assessment/Plan:   Cardiogenic Shock, 2/2 Profound RV Failure --> Resolved - cath 6/10 normal left-dominant system with non-visualized tiny RCA - Echo LVEF 60-65% RV moderate HK with  McConnell's sign - RHC 6/10 (on NE 15): RA 18, PA 35/21 (26), PCWP 19, CO/CI (TD) 2.9/1.6, PVR 0.8, PAPi 0.78 - CT w/o PE - s/p Impella RP placement. Removed 6/15. - POCUS ECHO 6/12 w/ improvement LVEF 60% RV mild HK  - 6/19 Echo No evidence of endocarditis. RV normal. LV 60-65%.  - Stopped dobutamine  on 6/20.   - Volume status looks ok  - Continue torsemide  20mg  daily - Add 25  mg spiro daily - Check BMET   Acute diastolic HF - plan as above  NSTEMI with RV infarct: Resolved  - plan as above - continue ASA - no b-blocker with shock  Tachy-brady Syndrome - Previously in/out AF in 120-130s and acclerated junctional in 40s. - Now in sinus bradycardia, rates in the 30-40s. Felt better paced - TEE - no endocarditis.  - TVP out 6/25.  -Stable  overnight. 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   Thrombocytopenia - heparin  switched to bival on 6/13. PLTs stabilized/improved off heparin  - SRA negative - Resolved.    Bacteremia: Line related source - Lines removed. Bcx 6/17 grew MSSE.  - WBC normal.  -- TEE no endocarditis.   PICC removed 07/11/23  -Per ID, Dr Ula- planning for 2 weeks IV cefazolin   (07/20/23) then 2 weeks Linezolid . Duration is 4 weeks because of the ulcerated plaque in her aorta and want to be aggressive so that it does not become infected. She will need another PICC placed prior to d/c in the other arm ---> left.   Place order for PICC LUE 6/27 single lumen.  -  6/19 blood cultures- No growth -CM following for IV antibiotics. [facility] will follow. -EP following.    Hyperkalemia: resolved Shock Liver: resolved AKI: resolved  Hypertension  Irbesartan  300 mg daily was ordered but not given. Discussed with staff. Add 25 mg spironolactone daily    Renal function stable.   PAF On Heparin  drip. Stop heparin  drip. Start  eliquis 5 mg twice a day.   Transfer orders in for 3 east     Length of Stay: 16 Advanced Heart Failure Team Pager [phone] (M-F; 7a - 5p)

## 2023-07-13 NOTE — TOC Progression Note (Signed)
 Transition of Care [facility]) - Progression Note    Patient Details  Name: Vicki [name] MRN: [id number] Date of Birth: 06/19/1950  [facility] CM/SW Contact  Justina Delcia Czar, RN Phone Number: [phone] 07/13/2023, 8:10 PM  Clinical Narrative:     Spoke to pt, husband and son at bedside. Pt agreeable to [facility] for [facility]. [employer] rep, Pam following for Home IV abx. Pt requesting Rollator and 3n1 bsc. Contacted [facility] for DME for home.   Husband will be at home to assist with care at home.   Medicare.gov list with ratings placed on chart and provided to pt.   Expected Discharge Plan: Home w Home Health Services Barriers to Discharge: Continued Medical Work up  Expected Discharge Plan and Services   Discharge Planning Services: CM Consult Post Acute Care Choice: Home Health Living arrangements for the past 2 months: Single Family Home                 DME Arranged: 3-N-1, Walker rolling with seat DME Agency: [facility] Date DME Agency Contacted: 07/13/23 Time DME Agency Contacted: 2008 Representative spoke with at DME Agency: [facility] HH Arranged: RN, PT, OT [facility] Agency: [facility] Date [facility] Agency Contacted: 07/13/23 Time HH Agency Contacted: 2008 Representative spoke with at [facility] Agency: [facility]   Social Determinants of Health (SDOH) Interventions SDOH Screenings   Food Insecurity: No Food Insecurity (06/28/2023)  Housing: Low Risk  (06/28/2023)  Transportation Needs: No Transportation Needs (06/28/2023)  Utilities: Not At Risk (06/28/2023)  Social Connections: Socially Integrated (06/28/2023)  Tobacco Use: Medium Risk (06/27/2023)    Readmission Risk Interventions     No data to display

## 2023-07-13 NOTE — Telephone Encounter (Signed)
 Patient seen in hospital for SND / bradycardia.  Did not require PPM as conduction recovered. Plan for live monitor at time of discharge. Do not anticipate her to leave on 6/26 > will plan for placement of Zio at 1pm on 6/27.  Can be moved up if needed sooner.   EP APP Follow up arranged.     Daphne Barrack, NP-C, AGACNP-BC [facility] - Electrophysiology  07/13/2023, 10:26 AM

## 2023-07-13 NOTE — Progress Notes (Addendum)
 Patient Name: Vicki [name] Date of Encounter: 07/13/2023  Primary Cardiologist: None Electrophysiologist: None  Interval Summary   The patient reports her BP was up overnight and she had a headache. No other concerns.   Vital Signs    Vitals:   07/13/23 0645 07/13/23 0700 07/13/23 0805 07/13/23 0900  BP: (!) 142/79 (!) 144/75 (!) 142/78 (!) 146/84  Pulse: 75 73 80 78  Resp: 13 13 (!) 22 18  Temp:      TempSrc:      SpO2: 95% 95% 96% 97%  Weight:      Height:        Intake/Output Summary (Last 24 hours) at 07/13/2023 1016 Last data filed at 07/13/2023 0900 Gross per 24 hour  Intake 793.31 ml  Output 1000 ml  Net -206.69 ml   Filed Weights   07/11/23 0500 07/12/23 0339 07/13/23 0354  Weight: 78.1 kg 72.6 kg 73 kg    Physical Exam    GEN- The patient is well appearing, alert and oriented x 3 today.   Lungs- Clear to ausculation bilaterally, normal work of breathing Cardiac- Regular rate and rhythm, no murmurs, rubs or gallops GI- soft, NT, ND, + BS Extremities- no clubbing or cyanosis. No edema  Telemetry    SR 70-80's, occ PVC, no pauses (personally reviewed)  Hospital Course    Vicki [name] is a 73 y.o. female  with PMH of HTN, HLD, tobacco abuse admitted 06/27/23 for malaise, weakness & chest pain in setting of NSTEMI and RV failure. Had new AF on 06/25/23 at [facility] but refused ER transfer. Emergent R/LHC with no significant CAD or culprit lesion, but unable to angiographically identify RCA (seen as small on [facility] 2017), severe RV failure s/p Impella. 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 Impella removed 07/02/23, continued to require vasopressors. Shock liver, AKI improved. ON 6/17 noted to have fever to 102, leukocytosis. BCx4 positive for staph EPI. ID following, on abx. Continued to have intermittent bradycardia despite vasopressors, temp  wire placed 07/05/23 in [facility].  Conduction has been improving.   Assessment & Plan    SND with possible Sinus Arrest  Junctional Rhythm  Tachy-Brady  -tele reviewed, SR with no pauses overnight.  Conduction issues appear to have been transient in the setting of critical illness.  She does not have an indication for PPM at this time.  -plan for live cardiac monitor at discharge and EP follow up in clinic   Paroxysmal AF  -transition to DOAC per pharmacy   Staph EPI Bacteremia  -per ID   EP will sign off. Please call back if new needs arise.    For questions or updates, please contact [facility] Please consult [email or web address] for contact info under     Signed, Daphne Barrack, NP-C, AGACNP-BC [facility] - Electrophysiology  07/13/2023, 10:19 AM  I have seen, examined the patient, and reviewed the above assessment and plan.    Interval: Temp wire removed yesterday. No acute overnight events. No bradycardia or pauses on tele.  Assessment and Plan:  #. Bradycardia: In the setting of her acute illness patient developed inappropriate bradycardia.  Temporary wire was placed.  After stopping dobutamine  she had increase in temporary usage.  Bradycardia appears to be mainly sinus node dysfunction.  With time it appears that sinus node has recovered.  Today she continues to conduct  one-to-one at normal rates.  Overall, appears to have recovered.  She has had no real pacing needs for past 48 hours. #. Sinus node dysfunction: Rare sinus pauses, less than 2 seconds in duration.  Appear to be asymptomatic. - We will continue to evaluate telemetry daily.  I have not seen evidence of conduction disease. This appears to be isolated sinus node dysfunction that is improving. Query if she has occluded RCA as the etiology and potential sinus node ischemia. No hard indication for permanent pacer implant at this time unless she develops symptoms from sinus node dysfunction.  Given bacteremia  and need for prolonged IV antibiotics, would like to avoid pacer implant if possible. - Live Zio monitor on discharge. EP APP follow up in 2 weeks.    #. Paroxysmal atrial fibrillation:  - Continue heparin .    #. Staph bacteremia: -Appreciate ID assistance   Fonda Kitty, MD 07/13/2023 9:05 PM

## 2023-07-13 NOTE — Progress Notes (Signed)
 ANTICOAGULATION CONSULT NOTE  Pharmacy Consult for heparin >> bivalirudin  >>heparin >>Eliquis  Indication: Impella RP >Afib  Allergies  Allergen Reactions   Codeine Other (See Comments)    headache   Crestor [Rosuvastatin] Other (See Comments)    Muscle aches   Other Nausea And Vomiting    Anesthesia-severe vomiting   Simvastatin Other (See Comments)    Muscle aches   Egg White (Egg Protein)     Other Reaction(s): Throat closes   Ezetimibe      Other Reaction(s): crippling muscle aches   Hydrocodone-Acetaminophen      Other Reaction(s): migraines   Peanut (Diagnostic)     Other Reaction(s): itchy   Shellfish Allergy Other (See Comments)    Throat swelling   Statins     Patient Measurements: Height: 5' 3 (160 cm) Weight: 73 kg (160 lb 15 oz) IBW/kg (Calculated) : 52.4 Heparin  Dosing Weight: 68 kg   Vital Signs: Temp: 98.3 F (36.8 C) (06/26 0325) Temp Source: Oral (06/26 0325) BP: 146/84 (06/26 0900) Pulse Rate: 78 (06/26 0900)  Labs: Recent Labs    07/11/23 0500 07/12/23 0358 07/12/23 1513 07/12/23 2344 07/13/23 0308  HGB 10.0* 10.3*  --   --  10.5*  HCT 30.1* 31.0*  --   --  31.8*  PLT 293 296  --   --  290  HEPARINUNFRC 0.70 0.85* 0.46 0.48 0.60  CREATININE 0.60 0.53  --   --  0.50    Estimated Creatinine Clearance: 60.8 mL/min (by C-G formula based on SCr of 0.5 mg/dL).   Assessment: 73 yo female in cardiogenic shock with RV failure s/p Impella RP 6/10.  Not on anticoagulation prior to admission.  Pharmacy consulted for heparin  dosing.  Chest CT w/ Type B dissection (small ulcerated plaque/developing intramural hematoma ~8 mm x 14 mm).   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  Discussed with team since HIT negative will change to heparin  infusion 6/17.   Heparin  level therapeutic (0.60) this morning.  No plans for PPM this admission.  OK to transition to Eliquis per EP.    Plan:  STOP heparin  START Eliquis 5 mg BID Copay = $47/mo  Thank you for  allowing pharmacy to participate in this patient's care,  Maurilio Fila, PharmD Clinical Pharmacist 07/13/2023  10:16 AM

## 2023-07-14 ENCOUNTER — Other Ambulatory Visit (HOSPITAL_COMMUNITY): Payer: Self-pay

## 2023-07-14 ENCOUNTER — Inpatient Hospital Stay (HOSPITAL_COMMUNITY)
Admit: 2023-07-14 | Discharge: 2023-07-14 | Disposition: A | Discharge status: Home / Self Care | Attending: Pulmonary Disease | Admitting: Pulmonary Disease

## 2023-07-14 ENCOUNTER — Telehealth: Payer: Self-pay | Admitting: Student

## 2023-07-14 DIAGNOSIS — I509 Heart failure, unspecified: Secondary | ICD-10-CM | POA: Insufficient documentation

## 2023-07-14 DIAGNOSIS — I214 Non-ST elevation (NSTEMI) myocardial infarction: Secondary | ICD-10-CM

## 2023-07-14 DIAGNOSIS — I480 Paroxysmal atrial fibrillation: Secondary | ICD-10-CM | POA: Insufficient documentation

## 2023-07-14 DIAGNOSIS — I495 Sick sinus syndrome: Secondary | ICD-10-CM

## 2023-07-14 DIAGNOSIS — R5381 Other malaise: Secondary | ICD-10-CM | POA: Insufficient documentation

## 2023-07-14 DIAGNOSIS — R001 Bradycardia, unspecified: Secondary | ICD-10-CM

## 2023-07-14 DIAGNOSIS — I5031 Acute diastolic (congestive) heart failure: Secondary | ICD-10-CM | POA: Insufficient documentation

## 2023-07-14 DIAGNOSIS — R570 Cardiogenic shock: Secondary | ICD-10-CM | POA: Diagnosis not present

## 2023-07-14 HISTORY — DX: Acute diastolic (congestive) heart failure: I50.31

## 2023-07-14 HISTORY — DX: Paroxysmal atrial fibrillation: I48.0

## 2023-07-14 HISTORY — DX: Heart failure, unspecified: I50.9

## 2023-07-14 HISTORY — DX: Other malaise: R53.81

## 2023-07-14 HISTORY — DX: Sick sinus syndrome: I49.5

## 2023-07-14 HISTORY — DX: Non-ST elevation (NSTEMI) myocardial infarction: I21.4

## 2023-07-14 LAB — BASIC METABOLIC PANEL WITH GFR
Anion gap: 12 (ref 5–15)
BUN: 13 mg/dL (ref 8–23)
CO2: 26 mmol/L (ref 22–32)
Calcium: 9.2 mg/dL (ref 8.9–10.3)
Chloride: 96 mmol/L — ABNORMAL LOW (ref 98–111)
Creatinine, Ser: 0.6 mg/dL (ref 0.44–1.00)
GFR, Estimated: >60 mL/min (ref >60)
Glucose, Bld: 101 mg/dL — ABNORMAL HIGH (ref 70–99)
Potassium: 3.8 mmol/L (ref 3.5–5.1)
Sodium: 134 mmol/L — ABNORMAL LOW (ref 135–145)

## 2023-07-14 LAB — CBC
HCT: 32 % — ABNORMAL LOW (ref 36.0–46.0)
Hemoglobin: 10.6 g/dL — ABNORMAL LOW (ref 12.0–15.0)
MCH: 31.3 pg (ref 26.0–34.0)
MCHC: 33.1 g/dL (ref 30.0–36.0)
MCV: 94.4 fL (ref 80.0–100.0)
Platelets: 287 10*3/uL (ref 150–400)
RBC: 3.39 MIL/uL — ABNORMAL LOW (ref 3.87–5.11)
RDW: 14.1 % (ref 11.5–15.5)
WBC: 8.2 10*3/uL (ref 4.0–10.5)
nRBC: 0 % (ref 0.0–0.2)

## 2023-07-14 LAB — MAGNESIUM: Magnesium: 2 mg/dL (ref 1.7–2.4)

## 2023-07-14 MED ORDER — ASPIRIN 81 MG PO TBEC
81.0000 mg | DELAYED_RELEASE_TABLET | Freq: Every day | ORAL | 3 refills | Status: AC
  Filled 2023-07-14: qty 90, 90d supply, fill #0

## 2023-07-14 MED ORDER — CEFAZOLIN IV (FOR PTA / DISCHARGE USE ONLY)
2.0000 g | Freq: Three times a day (TID) | INTRAVENOUS | 0 refills | Status: DC
Start: 2023-07-14 — End: 2023-07-24

## 2023-07-14 MED ORDER — SODIUM CHLORIDE 0.9% FLUSH: 10.0000 mL | INTRAVENOUS | Status: DC | PRN

## 2023-07-14 MED ORDER — SODIUM CHLORIDE 0.9% FLUSH: 10.0000 mL | Freq: Two times a day (BID) | INTRAVENOUS | Status: DC

## 2023-07-14 MED ORDER — APIXABAN 5 MG PO TABS
5.0000 mg | ORAL_TABLET | Freq: Two times a day (BID) | ORAL | 11 refills | Status: AC
  Filled 2023-07-14: qty 60, 30d supply, fill #0

## 2023-07-14 MED ORDER — LINEZOLID 600 MG PO TABS
600.0000 mg | ORAL_TABLET | Freq: Two times a day (BID) | ORAL | 0 refills | Status: AC
  Filled 2023-07-14: qty 28, 14d supply, fill #0

## 2023-07-14 MED ORDER — TORSEMIDE 20 MG PO TABS
20.0000 mg | ORAL_TABLET | Freq: Every day | ORAL | 2 refills | Status: DC
  Filled 2023-07-14: qty 30, 30d supply, fill #0

## 2023-07-14 MED ORDER — SPIRONOLACTONE 25 MG PO TABS
25.0000 mg | ORAL_TABLET | Freq: Every day | ORAL | 2 refills | Status: AC
  Filled 2023-07-14: qty 30, 30d supply, fill #0

## 2023-07-14 MED ORDER — PANTOPRAZOLE SODIUM 40 MG PO TBEC
40.0000 mg | DELAYED_RELEASE_TABLET | Freq: Every day | ORAL | 2 refills | Status: DC
  Filled 2023-07-14: qty 30, 30d supply, fill #0

## 2023-07-14 NOTE — Plan of Care (Signed)

## 2023-07-14 NOTE — Plan of Care (Signed)
   Problem: Education: Goal: Knowledge of General Education information will improve Description Including pain rating scale, medication(s)/side effects and non-pharmacologic comfort measures Outcome: Progressing   Problem: Clinical Measurements: Goal: Ability to maintain clinical measurements within normal limits will improve Outcome: Progressing   Problem: Activity: Goal: Risk for activity intolerance will decrease Outcome: Progressing

## 2023-07-14 NOTE — Discharge Summary (Addendum)
 Discharge Summary   Patient ID: Vicki [name] MRN: [id number]; DOB: [birth year]  Admit date: 06/27/2023 Discharge date: 07/14/2023  PCP:  Claudene Pellet, MD   [facility] Providers Cardiologist:  Dr. Mona  Discharge Diagnoses  Principal Problem:   Cardiogenic shock [facility]) Active Problems:   Non-ST elevation (NSTEMI) myocardial infarction [facility] [place])   Acute diastolic CHF (congestive heart failure) (HCC)   Hyperlipidemia   Coagulase negative Staphylococcus bacteremia   Sinus node dysfunction (HCC)   Tachycardia-bradycardia syndrome (HCC)   Paroxysmal atrial fibrillation [facility])   Physical deconditioning   Diagnostic Studies/Procedures   Right/ Left Cardiac Catheterization 06/27/2023:   The left ventricular ejection fraction is greater than 65% by visual estimate.   Findings: On NE 15   Ao = 123/68(93) LV = 97/19 RA = 18 RV = 35/17 PA =  35/21 (26) PCW = 19 Fick cardiac output/index = 2.3/1.3 Thermodilution CO/CI = 2.9/1.6 PVR = 0.8 WU Ao sat = 96% PA sat = 37%, 41% PAPi = 0.78   Assessment: 1. Left dominant coronary system with normal LM, LAD and LCx. Small non-dominant RCA not visualized (previously seen in 2017) 2. LVEF 65% 3. Evidence of severe RV failure with profound shock requiring placement of Impella RP support device  Diagnostic Dominance: Left    _____________  Complete Echocardiogram 06/27/2023: Impressions:  1. Left ventricular ejection fraction, by estimation, is 60 to 65%. The  left ventricle has normal function. Left ventricular endocardial border  not optimally defined to evaluate regional wall motion. Left ventricular  diastolic function could not be  evaluated.   2. Hyperdynamic apex; cannot rule out RV strain; there is concomittant  IVC plethora. Right ventricular systolic function is mildly reduced. The  right ventricular size is normal.   3. The mitral valve is grossly normal. No evidence of mitral valve  regurgitation.   4.  The aortic valve was not well visualized. Aortic valve regurgitation  is not visualized.   5. The inferior vena cava is dilated in size with <50% respiratory  variability, suggesting right atrial pressure of 15 mmHg.   6. This is a limited study. This study does not evaluated all parameters  for restrictive cardiomyopathy. Consider repeat study in the future if  clinically indicated; at that time TDI would be usefuly.  _______________  Limited Echocardiogram 06/27/2023: Impressions: 1. Left ventricular ejection fraction, by estimation, is 60 to 65%. The  left ventricle has normal function. The left ventricle has no regional  wall motion abnormalities.   2. RV strain attempted- unable to analyze form subcostal      RV Impella device noted.      Inlet port visualized at IVC/RA junction      Cannula traverses tricuspid valve along the RV free wall, no  entanglement     Outlet port visualized in main PA      Tricuspid regurgitation not assessed. Right ventricular systolic  function is moderately reduced.   3. The inferior vena cava is normal in size with <50% respiratory  variability, suggesting right atrial pressure of 8 mmHg.  _______________  Temporary Pacemaker 07/05/2023: Successful placement of temporary dialysis catheter  _______________  Complete Echocardiogram 07/06/2023: Impressions;  1. Technically difficult study.   2. Left ventricular ejection fraction, by estimation, is 60 to 65%. The  left ventricle has normal function. The left ventricle has no regional  wall motion abnormalities. Left ventricular diastolic parameters were  normal.   3. Right ventricular systolic function is normal. The right ventricular  size is mildly enlarged. A Temporary pacemaker lead is visualized.  Tricuspid regurgitation signal is inadequate for assessing PA pressure.   4. A small pericardial effusion is present.   5. The mitral valve is grossly normal. No evidence of mitral valve   regurgitation.   6. The aortic valve has an indeterminant number of cusps. Aortic valve  regurgitation is not visualized.   7. The inferior vena cava is normal in size with <50% respiratory  variability, suggesting right atrial pressure of 8 mmHg.  _______________  Transesophageal Echocardiogram 07/10/2023: Summary: Normal LV function, LVEF 55% Mildly dilated RV with mildly reduced function. PA mildly dilated.  No significant TR/MR/PI No vegetations or signs of endocarditis. Full report to follow   History of Present Illness   Vicki [name] is a 73 y.o. 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  On arrival to the ED, she was noted to be hypotensive with systolic BP in the 60s and a junctional bradycardia with rates in the 40s with transient atrial flutter.  EKG showed no ST elevation.  High-sensitivity troponin was elevated at 5,300 lactic acid was 5.3.  Emergent CTA was negative for PE but showed irregular atheromatous plaque along the lateral wall of the mid and distal descending thoracic aorta with a small ulcerated plaque and developing intramural hematoma measuring 8 mm in maximal thickness by 14 mm in maximum AP diameter.  She was started on Norepinephrine  and admitted for cardiogenic shock.  Hospital Course   Consultants: None   NSTEMI with RV Infarct  Cardiogenic Shock Secondary to Profound RV Failure Acute Diastolic CHF Patient was admitted with NSTEMI and cardiogenic shock secondary to RV infarct on 06/27/2023 after presenting with 3 days of chest pain radiating to her back. High sensitive troponin peaked peaked at 6, 049.  Lactic acid was initially 5.3 and then trended down. LHC showed normal left dominant system with  nonvisualized tiny RCA and evidence of severe RV failure with profound shock requiring placement of Impella RP support device. CTA was negative for PE.  Echo showed LVEF of 65 to 65% and mildly reduced RV with hyperdynamic apex.  She was initially treated with Norepinephrine  and Dobutamine  entire reached with IV Lasix  and 1 dose of Metolazone .  Impella was removed on 07/02/2023. Norepinephrine  was able to be weaned off on 6/18 and Dobutamine  was stopped on 6/20.  She was transitioned to oral diuretics and will be discharged on Torsemide  20mg  daily and Spironolactone  25mg  daily.  She was previously taking Aspirin  325mg  daily at home. She was advised to only take 81mg  daily at discharge. Continue home Repatha . No beta-blocker given sinus node dysfunction.  Tachy-Brady Syndrome Paroxysmal Atrial Fibrillation Sinus Node Dysfunction Patient was initially noted to be going in and out of atrial fibrillation with rates in the 120s to 130s; however, she also had significant bradycardia/junctional rhythm with rates as low as the 30s to 40s.  EP was consulted and she had a temporary transvenous pacemaker placed on 07/05/2023.  Per EP, bradycardia appeared to be mainly sinus node dysfunction but sinus node has recovered.  Given bacteremia and need for prolonged IV antibiotics, wanted to avoid a permanent pacemaker if possible.  Thankfully, sinus node recovered and TVP was able to be removed.  EP recommended  placing a 2-week live ZIO monitor prior to discharge.  She initially on anticoagulation with IV heparin  was started on Eliquis  5 mg twice daily prior to discharge given atrial fibrillation.  Follow-up with EP arranged.   Aortic Ulceration CTA on admission showed distal descending thoracic aorta with a small ulcerated plaque and developing intramural hematoma measuring 8 mm in maximal thickness by 14 mm in maximum AP diameter. Reviewed by Vascular Surgery and 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.  Hypertension History of hypertension  but hypotensive on admission in setting of cardiogenic shock. BP improved. Home Clonidine  stopped but continued on Telmisartan 40mg  daily.  Hyperlipidemia Lipid panel was not checked this admission. Intolerant to statins in the past. Continue Repatha  as an outpatient.   Bacteremia She spiked a fever of > 102 on 6/17 and WBCs peaked at 15.4.  Blood cultures were positive for Staph Epi.  ID was consulted patient was started on antibiotics.  TEE was negative for endocarditis and bacteremia was felt to be secondary to joint lines.  PICC line was removed on 6/24.  Per Dr. Ula, plan is for 2 weeks of IV Cefazolin  which patient will finish on 07/20/2023 and then 2 weeks of PO Linezolid .  Total duration 4 weeks because of the ulcerated plaque in her aorta and desire to be aggressive so that it does not become effective.  She had a new PICC line placed prior to discharge and her left arm and was educated on administer IV antibiotics through PICC line at home.  Follow-up with ID arranged.  Thrombocytopenia - Resolved  Platelets dropped as low as 56,000 on 06/30/2023. IV Heparin  was stopped and she was switched to Bivalrudin with improvement. SRA negative. Platelets normalized and were 287 on discharge. Recommend repeat CBC at follow-up visit.  Normocytic Anemia Baseline hemoglobin around 12 to 13 but dropped as low as 8.2 on 06/30/2023. Did not require any blood transfusion or iron infusions. Hemoglobin stable at 10.6 on day of discharge. Recommended repeat CBC at follow-up visit.  Hyponatremia - Resolved Hyperkalemia - Resolved Sodium dropped as low as 120 and potassium peaked at 6.1 on 07/05/2023. She was treated with fluid restrictions and Lokelma  with improvement. Sodium 134 and potassium 3.8 on discharge. Recommend repeat BMET at follow-up visit.  AKI - Resolved  Shock Liver - Resolved Secondary to cardiogenic shock. Resolved prior to discharge.  Deconditioning Home health PT/ OT at  discharge.  Patient was seen an examined by Dr. Court today and determined to be stable for discharge. Outpatient follow-up arranged. Medications as below.   Did the patient have an acute coronary syndrome (MI, NSTEMI, STEMI, etc) this admission?:  Yes                               AHA/ACC ACS Clinical Performance & Quality Measures: Aspirin  prescribed? - Yes ADP Receptor Inhibitor (Plavix/Clopidogrel, Brilinta/Ticagrelor or Effient/Prasugrel) prescribed (includes medically managed patients)? - No - given clean coronaries and need for Eliquis  Beta Blocker prescribed? - No - given sinus node dysfunction High Intensity Statin (Lipitor 40-80mg  or Crestor 20-40mg ) prescribed? - No - intolerant to statin (on Repatha  at home) EF assessed during THIS hospitalization? - Yes For EF <40%, was ACEI/ARB prescribed? - Not Applicable (EF >/= 40%) For EF <40%, Aldosterone Antagonist (Spironolactone  or Eplerenone) prescribed? - Not Applicable (EF >/= 40%) Cardiac Rehab Phase II ordered (including medically managed patients)? - Yes   The patient will be scheduled for a  TOC follow up appointment within 14 days.  A message has been sent to the [facility] and Scheduling Pool at the office where the patient should be seen for follow up.  _____________  Discharge Vitals Blood pressure (!) 165/84, pulse 76, temperature (!) 97.5 F (36.4 C), temperature source Oral, resp. rate 20, height 5' 3 (1.6 m), weight 70.8 kg, last menstrual period 07/18/2011, SpO2 100%.  Filed Weights   07/12/23 0339 07/13/23 0354 07/14/23 0505  Weight: 72.6 kg 73 kg 70.8 kg    Labs & Radiologic Studies  CBC Recent Labs    07/13/23 0308 07/14/23 0235  WBC 8.8 8.2  HGB 10.5* 10.6*  HCT 31.8* 32.0*  MCV 92.7 94.4  PLT 290 287   Basic Metabolic Panel Recent Labs    93/73/74 0308 07/14/23 0235  NA 133* 134*  K 3.6 3.8  CL 96* 96*  CO2 28 26  GLUCOSE 98 101*  BUN 9 13  CREATININE 0.50 0.60  CALCIUM  8.9 9.2  MG 1.8 2.0    Liver Function Tests No results for input(s): AST, ALT, ALKPHOS, BILITOT, PROT, ALBUMIN in the last 72 hours. No results for input(s): LIPASE, AMYLASE in the last 72 hours. High Sensitivity Troponin:   Recent Labs  Lab 06/27/23 1020 06/27/23 1200 06/27/23 1629 06/28/23 0730 06/28/23 1017  TROPONINIHS 5,310* 3,412* 6,049* 4,251* 3,754*    No results for input(s): TRNPT in the last 720 hours.  BNP Invalid input(s): POCBNP No results for input(s): PROBNP in the last 72 hours.  No results for input(s): BNP in the last 72 hours.  D-Dimer No results for input(s): DDIMER in the last 72 hours. Hemoglobin A1C No results for input(s): HGBA1C in the last 72 hours. Fasting Lipid Panel No results for input(s): CHOL, HDL, LDLCALC, TRIG, CHOLHDL, LDLDIRECT in the last 72 hours. Lipoprotein (a)  Date/Time Value Ref Range Status  08/02/2022 08:58 AM 21.9 <75.0 nmol/L Final    Comment:    Note:  Values greater than or equal to 75.0 nmol/L may        indicate an independent risk factor for CHD,        but must be evaluated with caution when applied        to non-Caucasian populations due to the        influence of genetic factors on Lp(a) across        ethnicities.     Thyroid  Function Tests No results for input(s): TSH, T4TOTAL, T3FREE, THYROIDAB in the last 72 hours.  Invalid input(s): FREET3 _____________  US  EKG SITE RITE Result Date: 07/13/2023 If Site Rite image not attached, placement could not be confirmed due to current cardiac rhythm.  DG CHEST PORT 1 VIEW Result Date: 07/08/2023 CLINICAL DATA:  Migration of central venous catheter chest EXAM: PORTABLE CHEST 1 VIEW COMPARISON:  Chest radiograph dated 07/06/2023 FINDINGS: Lines/tubes: Right internal jugular venous catheter tip projects over the main pulmonary artery trunk. Right upper extremity PICC tip projects over the superior cavoatrial junction. Previously noted left-sided  catheter is no longer seen. Lungs: Unchanged asymmetric elevation of the right hemidiaphragm with low lung volumes with bronchovascular crowding. Diffuse bilateral interstitial opacities. Pleura: Trace blunting of right costophrenic angle. No pneumothorax. Heart/mediastinum: Right heart border is obscured. Bones: No acute osseous abnormality. IMPRESSION: 1. Right internal jugular venous catheter tip projects over the main pulmonary artery trunk. 2. Right upper extremity PICC tip projects over the superior cavoatrial junction. 3. Diffuse bilateral interstitial opacities, likely pulmonary edema.  4. Trace blunting of right costophrenic angle, which may represent a trace pleural effusion. Electronically Signed   By: Limin  Xu M.D.   On: 07/08/2023 11:10   CARDIAC CATHETERIZATION Result Date: 07/06/2023 Successful placement of temporary dialysis catheter   DG CHEST PORT 1 VIEW Result Date: 07/06/2023 CLINICAL DATA:  Chest pain, cardiogenic shock, heart failure EXAM: PORTABLE CHEST 1 VIEW COMPARISON:  07/04/2023 FINDINGS: Single frontal view of the chest demonstrates right-sided PICC tip overlying superior vena cava. Right internal jugular catheter tip overlies the main pulmonary outflow tract. Additional radiopaque wire overlying the left chest is likely external to the patient. Cardiac silhouette is stable. Stable elevated right hemidiaphragm. No airspace disease, effusion, or pneumothorax. IMPRESSION: 1. Support devices as above. 2. Elevated right hemidiaphragm.  No acute airspace disease. Electronically Signed   By: Ozell Daring M.D.   On: 07/06/2023 14:23   ECHOCARDIOGRAM COMPLETE Result Date: 07/06/2023    ECHOCARDIOGRAM REPORT   Patient Name:   Vicki [name] Date of Exam: 07/06/2023 Medical Rec #:  [id number]       Height:       63.0 in Accession #:    [id number]      Weight:       172.8 lb Date of Birth:  05/13/1950       BSA:          1.817 m Patient Age:    73 years        BP:           97/71 mmHg  Patient Gender: F               HR:           70 bpm. Exam Location:  Inpatient Procedure: 2D Echo, Color Doppler, Cardiac Doppler and Intracardiac            Opacification Agent (Both Spectral and Color Flow Doppler were            utilized during procedure). Indications:     I50.9* Heart failure (unspecified)  History:         Patient has prior history of Echocardiogram examinations, most                  recent 06/27/2023. Risk Factors:Hypertension, Dyslipidemia and                  ETOH.  Sonographer:     Damien Senior RDCS Referring Phys:  SWAZIL[place] LEE Diagnosing Phys: Ria Commander  Sonographer Comments: Technically difficult due to breast implants and respiratory motion IMPRESSIONS  1. Technically difficult study.  2. Left ventricular ejection fraction, by estimation, is 60 to 65%. The left ventricle has normal function. The left ventricle has no regional wall motion abnormalities. Left ventricular diastolic parameters were normal.  3. Right ventricular systolic function is normal. The right ventricular size is mildly enlarged. A Temporary pacemaker lead is visualized. Tricuspid regurgitation signal is inadequate for assessing PA pressure.  4. A small pericardial effusion is present.  5. The mitral valve is grossly normal. No evidence of mitral valve regurgitation.  6. The aortic valve has an indeterminant number of cusps. Aortic valve regurgitation is not visualized.  7. The inferior vena cava is normal in size with <50% respiratory variability, suggesting right atrial pressure of 8 mmHg. FINDINGS  Left Ventricle: Left ventricular ejection fraction, by estimation, is 60 to 65%. The left ventricle has normal function. The left ventricle has no regional wall motion abnormalities. Definity   contrast agent was given IV to delineate the left ventricular  endocardial borders. The left ventricular internal cavity size was normal in size. Suboptimal image quality limits for assessment of left ventricular hypertrophy.  Left ventricular diastolic parameters were normal. Right Ventricle: The right ventricular size is mildly enlarged. No increase in right ventricular wall thickness. Right ventricular systolic function is normal. Tricuspid regurgitation signal is inadequate for assessing PA pressure. Left Atrium: Left atrial size was normal in size. Right Atrium: Right atrial size was normal in size. Pericardium: A small pericardial effusion is present. Mitral Valve: The mitral valve is grossly normal. No evidence of mitral valve regurgitation. Tricuspid Valve: The tricuspid valve is grossly normal. Tricuspid valve regurgitation is not demonstrated. Aortic Valve: The aortic valve has an indeterminant number of cusps. Aortic valve regurgitation is not visualized. Pulmonic Valve: The pulmonic valve was grossly normal. Pulmonic valve regurgitation is not visualized. Aorta: The aortic root and ascending aorta are structurally normal, with no evidence of dilitation. Venous: The inferior vena cava is normal in size with less than 50% respiratory variability, suggesting right atrial pressure of 8 mmHg. IAS/Shunts: No atrial level shunt detected by color flow Doppler. Additional Comments: A Temporary pacemaker lead is visualized in the right ventricle.  LEFT VENTRICLE PLAX 2D LVOT diam:     1.80 cm   Diastology LV SV:         49        LV e' medial:    10.00 cm/s LV SV Index:   27        LV E/e' medial:  10.0 LVOT Area:     2.54 cm  LV e' lateral:   12.40 cm/s                          LV E/e' lateral: 8.1  RIGHT VENTRICLE RV S prime:     12.40 cm/s TAPSE (M-mode): 1.6 cm LEFT ATRIUM             Index        RIGHT ATRIUM           Index LA Vol (A2C):   48.1 ml 26.47 ml/m  RA Area:     15.90 cm LA Vol (A4C):   42.6 ml 23.44 ml/m  RA Volume:   39.60 ml  21.79 ml/m LA Biplane Vol: 46.6 ml 25.64 ml/m  AORTIC VALVE LVOT Vmax:   90.20 cm/s LVOT Vmean:  69.300 cm/s LVOT VTI:    0.194 m  AORTA Ao Root diam: 3.00 cm Ao Asc diam:  3.40 cm MITRAL  VALVE MV Area (PHT): 2.99 cm     SHUNTS MV Decel Time: 254 msec     Systemic VTI:  0.19 m MV E velocity: 100.00 cm/s  Systemic Diam: 1.80 cm MV A velocity: 72.80 cm/s MV E/A ratio:  1.37 Aditya Sabharwal Electronically signed by Ria Commander Signature Date/Time: 07/06/2023/1:09:01 PM    Final (Updated)    DG CHEST PORT 1 VIEW Result Date: 07/04/2023 CLINICAL DATA:  Status post PICC placement EXAM: PORTABLE CHEST 1 VIEW COMPARISON:  Chest radiograph dated 07/04/2023 at 11:34 a.m. FINDINGS: Lines/tubes: Left IJ PA catheter tip projects over the main pulmonary artery. Interval repositioning of the right upper extremity PICC. Tip now projects over the superior cavoatrial junction, allowing for low lung volumes. Lungs: Asymmetric elevation of the right hemidiaphragm with asymmetrically lower right lung volumes. Right mid and left lower lung linear opacities. Pleura: Unchanged  blunting of the right costophrenic angle. No pneumothorax. Heart/mediastinum: Right heart border remains obscured. Bones: No acute osseous abnormality. IMPRESSION: 1. Interval repositioning of the right upper extremity PICC. Tip now projects over the superior cavoatrial junction, allowing for low lung volumes. 2. Unchanged blunting of the right costophrenic angle, which may represent a small pleural effusion or atelectasis. 3. Right mid and left lower lung linear opacities, likely atelectasis. Electronically Signed   By: Limin  Xu M.D.   On: 07/04/2023 15:02   DG CHEST PORT 1 VIEW Result Date: 07/04/2023 CLINICAL DATA:  PICC line placement EXAM: PORTABLE CHEST 1 VIEW COMPARISON:  07/02/2023 FINDINGS: Removal of the Impella device. Left IJ Swan-Ganz catheter tip in proximal right pulmonary artery. Placement of a right-sided PICC line. This terminates over the mid SVC and is angulated, likely entering the azygous vein. Moderate to marked right hemidiaphragm elevation. Midline trachea. Mild cardiomegaly. No pleural effusion or pneumothorax.  Improved mild interstitial edema. Improved bibasilar airspace disease. IMPRESSION: Right-sided PICC line likely enters the azygous vein. If superior caval/atrial junction location is desired, this should be retracted and readvanced 3-4 cm. Improved aeration with decreased interstitial edema and bibasilar airspace disease. No pneumothorax or other acute complication. Electronically Signed   By: Rockey Kilts M.D.   On: 07/04/2023 13:36   US  EKG SITE RITE Result Date: 07/04/2023 If Site Rite image not attached, placement could not be confirmed due to current cardiac rhythm.  DG CHEST PORT 1 VIEW Result Date: 07/02/2023 CLINICAL DATA:  Heart failure. EXAM: PORTABLE CHEST 1 VIEW COMPARISON:  06/30/2023 FINDINGS: The cardio pericardial silhouette is enlarged. Right IJ intra-aortic balloon pump and left IJ pulmonary artery catheter are stable in position. Asymmetric elevation right hemidiaphragm again noted. Persistent basilar atelectasis with probable bilateral effusions. Telemetry leads overlie the chest. IMPRESSION: Persistent basilar atelectasis with probable bilateral effusions. Stable position of support apparatus. Electronically Signed   By: Camellia Candle M.D.   On: 07/02/2023 07:44   DG CHEST PORT 1 VIEW Result Date: 06/30/2023 CLINICAL DATA:  Right ventricular assist device. EXAM: PORTABLE CHEST 1 VIEW COMPARISON:  06/29/2023 FINDINGS: Low volume film with asymmetric elevation of the right hemidiaphragm, similar to prior. Vascular congestion and bibasilar collapse/consolidation is not substantially changed. Probable small bilateral pleural effusions. Right IJ Impella device overlies the main pulmonary artery, similar to prior with tip projecting over the left main pulmonary artery. Left IJ pulmonary artery catheter tip overlies the proximal right main pulmonary artery. Heart size stable. Telemetry leads overlie the chest. IMPRESSION: 1. No substantial change in vascular congestion and bibasilar  collapse/consolidation. 2. Probable small bilateral pleural effusions. Electronically Signed   By: Camellia Candle M.D.   On: 06/30/2023 07:38   DG Chest Port 1 View Result Date: 06/29/2023 CLINICAL DATA:  Central line placement. EXAM: PORTABLE CHEST 1 VIEW COMPARISON:  06/28/2023 FINDINGS: Similar low lung volumes with asymmetric elevation right hemidiaphragm. The cardio pericardial silhouette is enlarged. There is pulmonary vascular congestion without overt pulmonary edema. Basilar atelectasis with probable small left pleural effusion. Left IJ pulmonary catheter tip is in the main pulmonary artery. Stable position of the Impella device overlying the main pulmonary artery. Left chest tube again noted without evidence for left-sided pneumothorax. IMPRESSION: 1. Left IJ pulmonary catheter tip is in the main pulmonary artery. Stable position of the Impella device with tip overlying the main pulmonary artery. 2. Low lung volumes with basilar atelectasis and probable small left pleural effusion. Electronically Signed   By: Camellia Candle HERO.D.  On: 06/29/2023 07:29   DG Chest Port 1 View Result Date: 06/28/2023 CLINICAL DATA:  Central line placement EXAM: PORTABLE CHEST 1 VIEW COMPARISON:  June 28, 2023, 5:16 a.m. FINDINGS: Tip of the Swan-Ganz in the right main proximal pulmonary artery. The tip of the Impella catheter device is in the outflow portion of the main pulmonary artery. Left chest tube. No pneumothorax No congestive changes No significant pleural effusions IMPRESSION: *No acute cardiopulmonary process. *Support devices as above. Electronically Signed   By: Franky Chard M.D.   On: 06/28/2023 09:24   DG CHEST PORT 1 VIEW Result Date: 06/28/2023 CLINICAL DATA:  [id number].  Right ventricular assist device present. EXAM: PORTABLE CHEST 1 VIEW COMPARISON:  Portable chest yesterday at 4:33 p.m. FINDINGS: 5:16 a.m. Left IJ approach catheter again terminates in the right lung base probably in a lower lobe  branch. Right IJ approach Impella device as before terminates in the main pulmonary artery. The cardiomediastinal silhouette and vascular pattern are normal. There is calcification of the transverse aorta. There is increased linear atelectasis in the left base. The lungs are otherwise clear. There is no substantial pleural effusion. Overall aeration is otherwise unchanged. IMPRESSION: 1. Increased linear atelectasis in the left base. No other interval change. 2. Left IJ approach catheter again terminates in the right lung base probably in a lower lobe branch. 3. Right IJ approach Impella device as before terminates in the main pulmonary artery. Electronically Signed   By: Francis Quam M.D.   On: 06/28/2023 05:56   CARDIAC CATHETERIZATION Result Date: 06/27/2023   The left ventricular ejection fraction is greater than 65% by visual estimate. Findings: On NE 15 Ao = 123/68(93) LV = 97/19 RA = 18 RV = 35/17 PA =  35/21 (26) PCW = 19 Fick cardiac output/index = 2.3/1.3 Thermodilution CO/CI = 2.9/1.6 PVR = 0.8 WU Ao sat = 96% PA sat = 37%, 41% PAPi = 0.78 Assessment: 1. Left dominant coronary system with normal LM, LAD and LCx. Small non-dominant RCA not visualized (previously seen in 2017) 2. LVEF 65% 3. Evidence of severe RV failure with profound shock requiring placement of Impella RP support device [name], MD 11:44 PM  US  Abdomen Limited RUQ (LIVER/GB) Result Date: 06/27/2023 CLINICAL DATA:  Prominent gallbladder on recent CT examination EXAM: ULTRASOUND ABDOMEN LIMITED RIGHT UPPER QUADRANT COMPARISON:  CT from earlier in the same day. FINDINGS: Gallbladder: No gallstones or wall thickening visualized. No sonographic Murphy sign noted by sonographer. Common bile duct: Diameter: 2.7 mm Liver: Mildly increased in echogenicity consistent with fatty infiltration. Portal vein is patent on color Doppler imaging with normal direction of blood flow towards the liver. Other: None. IMPRESSION: Fatty liver. No  acute abnormality in the gallbladder. Electronically Signed   By: Oneil Devonshire M.D.   On: 06/27/2023 21:45   ECHOCARDIOGRAM LIMITED Result Date: 06/27/2023    ECHOCARDIOGRAM LIMITED REPORT   Patient Name:   Vicki [name] Date of Exam: 06/27/2023 Medical Rec #:  [id number]       Height:       63.0 in Accession #:    [id number]      Weight:       160.0 lb Date of Birth:  10-Dec-1950       BSA:          1.759 m Patient Age:    73 years        BP:           142/102 mmHg Patient  Gender: F               HR:           72 bpm. Exam Location:  Inpatient Procedure: Limited Echo (Both Spectral and Color Flow Doppler were utilized            during procedure). Indications:    I50.9* Heart failure (unspecified)  History:        Patient has prior history of Echocardiogram examinations, most                 recent 06/27/2023. CHF; CAD.  Sonographer:    Meagan Baucom RDCS, FE, PE Sonographer#2:  Ellouise Mose RDCS Referring Phys: [id number] ADITYA GARDENIA  Sonographer Comments: Technically difficult study due to poor echo windows. Image acquisition challenging due to breast implants. Mclean present. Impella device in PA. IMPRESSIONS  1. Left ventricular ejection fraction, by estimation, is 60 to 65%. The left ventricle has normal function. The left ventricle has no regional wall motion abnormalities.  2. RV strain attempted- unable to analyze form subcostal     RV Impella device noted.     Inlet port visualized at IVC/RA junction     Cannula traverses tricuspid valve along the RV free wall, no entanglement     Outlet port visualized in main PA     Tricuspid regurgitation not assessed. Right ventricular systolic function is moderately reduced.  3. The inferior vena cava is normal in size with <50% respiratory variability, suggesting right atrial pressure of 8 mmHg. FINDINGS  Left Ventricle: Left ventricular ejection fraction, by estimation, is 60 to 65%. The left ventricle has normal function. The left ventricle has no regional wall  motion abnormalities. Right Ventricle: RV strain attempted- unable to analyze form subcostal RV Impella device noted. Inlet port visualized at IVC/RA junction Cannula traverses tricuspid valve along the RV free wall, no entanglement Outlet port visualized in main PA Tricuspid regurgitation not assessed. Right ventricular systolic function is moderately reduced. Pulmonary Artery: The pulmonary artery is of normal size. Venous: The inferior vena cava is normal in size with less than 50% respiratory variability, suggesting right atrial pressure of 8 mmHg. Stanly Leavens MD Electronically signed by Stanly Leavens MD Signature Date/Time: 06/27/2023/5:51:13 PM    Final    DG CHEST PORT 1 VIEW Result Date: 06/27/2023 CLINICAL DATA:  [id number] Central venous catheter in place, temporary [id number] EXAM: PORTABLE CHEST - 1 VIEW COMPARISON:  June 27, 2023, 10:34 a.m. FINDINGS: Elevation of the right hemidiaphragm. No focal airspace consolidation, pleural effusion, or pneumothorax. No cardiomegaly. Right IJ approach Impella device, terminating in the region of the main pulmonary artery. Left IJ approach introducer sheath with a pulmonary artery catheter, which terminates in the right lung base. IMPRESSION: 1. Right IJ approach Impella device terminating in the region the main pulmonary artery. 2. Left IJ approach pulmonary artery catheter terminating in the right lung base. 3. No pneumonia or pulmonary edema. Electronically Signed   By: Rogelia Myers M.D.   On: 06/27/2023 16:51   ECHOCARDIOGRAM COMPLETE Result Date: 06/27/2023    ECHOCARDIOGRAM REPORT   Patient Name:   Vicki [name] Date of Exam: 06/27/2023 Medical Rec #:  [id number]       Height:       63.0 in Accession #:    [id number]      Weight:       160.0 lb Date of Birth:  1950-03-29       BSA:  1.759 m Patient Age:    73 years        BP:           69/45 mmHg Patient Gender: F               HR:           45 bpm. Exam Location:  Inpatient  Procedure: 2D Echo, Cardiac Doppler and Color Doppler (Both Spectral and Color            Flow Doppler were utilized during procedure). Indications:    Shock  History:        Patient has no prior history of Echocardiogram examinations.  Sonographer:    Jayson Gaskins Referring Phys: [id number] BRITTAINY M SIMMONS IMPRESSIONS  1. Left ventricular ejection fraction, by estimation, is 60 to 65%. The left ventricle has normal function. Left ventricular endocardial border not optimally defined to evaluate regional wall motion. Left ventricular diastolic function could not be evaluated.  2. 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 Right ventricular systolic function is mildly reduced. The right ventricular size is normal.  3. The mitral valve is grossly normal. No evidence of mitral valve regurgitation.  4. The aortic valve was not well visualized. Aortic valve regurgitation is not visualized.  5. The inferior vena cava is dilated in size with <50% respiratory variability, suggesting right atrial pressure of 15 mmHg.  6. This is a limited study. This study does not evaluated all parameters for restrictive cardiomyopathy. Consider repeat study in the future if clinically indicated; at that time TDI would be usefuly. Comparison(s): No prior Echocardiogram. FINDINGS  Left Ventricle: Left ventricular ejection fraction, by estimation, is 60 to 65%. The left ventricle has normal function. Left ventricular endocardial border not optimally defined to evaluate regional wall motion. The left ventricular internal cavity size was normal in size. Suboptimal image quality limits for assessment of left ventricular hypertrophy. Left ventricular diastolic function could not be evaluated. 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 The right ventricular size is normal. No increase in right ventricular wall thickness. Right ventricular systolic function  is mildly reduced. Left Atrium: Left atrial size was not well visualized. Right Atrium: Right atrial size was not well visualized. Pericardium: Pericardium appears thick but is not well visualized. This is best seen in clip 28. There is no evidence of pericardial effusion. The pericardial effusion appears to contain mixed echogenic material. Thickening/calcification of pericardium present. Mitral Valve: The mitral valve is grossly normal. No evidence of mitral valve regurgitation. Tricuspid Valve: The tricuspid valve is not well visualized. Tricuspid valve regurgitation is not demonstrated. Aortic Valve: The aortic valve was not well visualized. Aortic valve regurgitation is not visualized. Pulmonic Valve: The pulmonic valve was not well visualized. Pulmonic valve regurgitation is not visualized. Aorta: The ascending aorta was not well visualized and the aortic arch was not well visualized. Venous: The inferior vena cava is dilated in size with less than 50% respiratory variability, suggesting right atrial pressure of 15 mmHg. IAS/Shunts: The interatrial septum was not well visualized. Additional Comments: A venous catheter is visualized in the right atrium. Stanly Leavens MD Electronically signed by Stanly Leavens MD Signature Date/Time: 06/27/2023/2:20:32 PM    Final    CT Angio Chest/Abd/Pel for Dissection W and/or W/WO Result Date: 06/27/2023 CLINICAL DATA:  Acute aortic syndrome EXAM: CT ANGIOGRAPHY CHEST, ABDOMEN AND PELVIS TECHNIQUE: Non-contrast CT of the chest was initially obtained. Multidetector  CT imaging through the chest, abdomen and pelvis was performed using the standard protocol during bolus administration of intravenous contrast. Multiplanar reconstructed images and MIPs were obtained and reviewed to evaluate the vascular anatomy. RADIATION DOSE REDUCTION: This exam was performed according to the departmental dose-optimization program which includes automated exposure control,  adjustment of the mA and/or kV according to patient size and/or use of iterative reconstruction technique. CONTRAST:  OMNIPAQUE  IOHEXOL  350 MG/ML SOLN COMPARISON:  CT abdomen and pelvis January 06, 2016 FINDINGS: CTA CHEST FINDINGS Cardiovascular: Satisfactory opacification of the pulmonary arteries to the segmental level. No evidence of pulmonary embolism. Normal heart size. No pericardial effusion. Subtle calcifications of the aortic wall without coronary artery calcifications. Mediastinum/Nodes: No enlarged mediastinal, hilar, or axillary lymph nodes. Thyroid  gland, trachea, and esophagus demonstrate no significant findings. Lungs/Pleura: Subtle ground-glass appearance of both lungs with fibro atelectatic changes of the right upper lobe may correlate with chronic interstitial lung disease without evidence of acute infiltrates consolidations or pulmonary edema. No pulmonary nodules. No pneumonia or pleural effusions Musculoskeletal: No chest wall abnormality. No acute or significant osseous findings. Review of the MIP images confirms the above findings. CTA ABDOMEN AND PELVIS FINDINGS VASCULAR Aorta: Normal caliber aorta without aneurysm, dissection, vasculitis or significant stenosis. Irregular atheromatous plaque along the lateral wall of the aorta from the 1 to 5 o'clock distribution within the mid and distal descending thoracic aorta with a small ulcerated plaque on image number 66 these findings could correlate with a developing intramural hematoma measuring about 8 mm in maximum thickness by 14 mm in maximum AP diameter as measured on image number 70. Celiac: Patent without evidence of aneurysm, dissection, vasculitis or significant stenosis. SMA: Patent without evidence of aneurysm, dissection, vasculitis or significant stenosis. Renals: Both renal arteries are patent without evidence of aneurysm, dissection, vasculitis, fibromuscular dysplasia or significant stenosis. IMA: Patent without evidence of  aneurysm, dissection, vasculitis or significant stenosis. Inflow: Patent without evidence of aneurysm, dissection, vasculitis or significant stenosis. Veins: No obvious venous abnormality within the limitations of this arterial phase study. Review of the MIP images confirms the above findings. NON-VASCULAR Hepatobiliary: Liver normal. Stable 10 mm hepatic cyst anterolateral margin of the right lobe of the liver compared with prior examination. Gallbladder appears slightly distended mildly prominent with poor definition of the fundus. Consider ultrasound evaluation to rule out associated acute cholecystitis. Pancreas: Unremarkable. No pancreatic ductal dilatation or surrounding inflammatory changes. Spleen: Normal in size without focal abnormality. Adrenals/Urinary Tract: Adrenal glands are unremarkable. Kidneys are normal, without renal calculi, focal lesion, or hydronephrosis. Bladder is unremarkable. Stomach/Bowel: Stomach is within normal limits. Appendix appears normal. No evidence of bowel wall thickening, distention, or inflammatory changes. Lymphatic: Unremarkable Reproductive: Uterus and bilateral adnexa are unremarkable. Other: No abdominal wall hernia or abnormality. No abdominopelvic ascites. Musculoskeletal: No fracture is seen. Post L4-5 transpedicular fixation and disc arthroplasty Review of the MIP images confirms the above findings. IMPRESSION: *No evidence of pulmonary embolism. *Irregular atheromatous plaque along the lateral wall of the mid and distal descending thoracic aorta with a small ulcerated plaque. These findings could correlate with a developing intramural hematoma measuring about 8 mm in maximum thickness by 14 mm in maximum AP diameter as measured on image number 70. (Intramural hematoma Stanford type B) *Subtle ground-glass appearance of both lungs with fibro atelectatic changes of the right upper lobe may correlate with chronic interstitial lung disease without evidence of acute  infiltrates consolidations or pulmonary edema. *Gallbladder appears slightly distended mildly prominent  with poor definition of the fundus. Consider ultrasound evaluation to exclude associated acute cholecystitis. Critical Value/emergent results were called by telephone at the time of interpretation on 06/27/2023 at 11:09 am to provider [facility] , who verbally acknowledged these results. Electronically Signed   By: Franky Chard M.D.   On: 06/27/2023 11:10   DG Chest Portable 1 View Result Date: 06/27/2023 CLINICAL DATA:  Shortness of breath and midsternal chest pain radiating to the back and jaw for the past few days. Recently diagnosed with atrial fibrillation. EXAM: PORTABLE CHEST 1 VIEW COMPARISON:  12/31/2018 FINDINGS: The heart size and mediastinal contours are within normal limits. Both lungs are clear. The visualized skeletal structures are unremarkable. IMPRESSION: No active disease. Electronically Signed   By: Elspeth Bathe M.D.   On: 06/27/2023 10:42    Disposition Pt is being discharged home today in good condition.  Follow-up Plans & Appointments  Follow-up Information     Vicci Rollo SAUNDERS, PA-C Follow up.   Specialty: Cardiology Why: Hospital follow-up with General Cardiology scheduled for 07/25/2023 at 1:55pm. Please arrive 15 mintues early for check-in. If this date/ time does not work for you, please call our office to reschedule. Contact information: [address] [place] [address] [phone]         Lesia Ozell Barter, PA-C Follow up.   Specialty: Cardiology Why: Hospital follow-up with EP scheduled for 08/15/2023: at 10:20am. Please arrive 15 minutes early for check-in. If this date/ time does not work for you, please call our office to reschedule. Contact information: [address] [place] [address] [phone]         Dea Shiner, MD Follow up.   Specialty: Infectious Diseases Why: Hospital follow-up with Infectious  Disease scheduled for 07/20/2023 at 2:00pm. Please arrive 15 minutes early for check-in. If this date/ time does not work for you, please call our office to reschedule. Contact information: [address] [employer] [address] [place] [address] [phone]         [employer] Follow up.   Why: rollator, [facility] Contact information: [address] [place] [address] [phone]                Discharge Instructions     AMB referral to Phase II Cardiac Rehabilitation   Complete by: As directed    Diagnosis: NSTEMI   After initial evaluation and assessments completed: Virtual Based Care may be provided alone or in conjunction with Phase 2 Cardiac Rehab based on patient barriers.: Yes   Intensive Cardiac Rehabilitation (ICR) [facility] location only OR Traditional Cardiac Rehabilitation (TCR) *If criteria for ICR are not met will enroll in TCR ([facility] only): Yes   [facility] pharmacist to adjust dose for Vancomycin, Aminoglycosides and other anti-infective therapies as requested by physician.   Complete by: As directed    [facility] to provide Cath Flo 2mg    Complete by: As directed    Administer for PICC line occlusion and as ordered by physician for other access device issues.   Anaphylaxis Kit: Provided to treat any anaphylactic reaction to the medication being provided to the patient if First Dose or when requested by physician   Complete by: As directed    Epinephrine  1mg /ml vial / amp: Administer 0.3mg  (0.7ml) subcutaneously once for moderate to severe anaphylaxis, nurse to call physician and pharmacy when reaction occurs and call 911 if needed for immediate care   Diphenhydramine  50mg /ml IV vial: Administer 25-50mg  IV/IM PRN for first  dose reaction, rash, itching, mild reaction, nurse to call physician and pharmacy when reaction occurs   Sodium Chloride  0.9% NS 500ml IV: Administer if needed for hypovolemic blood pressure drop or as ordered by  physician after call to physician with anaphylactic reaction   Change dressing on IV access line weekly and PRN   Complete by: As directed    Diet - low sodium heart healthy   Complete by: As directed    Flush IV access with Sodium Chloride  0.9% and Heparin  10 units/ml or 100 units/ml   Complete by: As directed    Home infusion instructions - [facility]   Complete by: As directed    Instructions: Flush IV access with Sodium Chloride  0.9% and Heparin  10units/ml or 100units/ml   Change dressing on IV access line: Weekly and PRN   Instructions Cath Flo 2mg : Administer for PICC Line occlusion and as ordered by physician for other access device   [facility] pharmacist to adjust dose for: Vancomycin, Aminoglycosides and other anti-infective therapies as requested by physician   Increase activity slowly   Complete by: As directed    Method of administration may be changed at the discretion of home infusion pharmacist based upon assessment of the patient and/or caregiver's ability to self-administer the medication ordered   Complete by: As directed    No wound care   Complete by: As directed        Discharge Medications Allergies as of 07/14/2023       Reactions   Codeine Other (See Comments)   headache   Crestor [rosuvastatin] Other (See Comments)   Muscle aches   Other Nausea And Vomiting   Anesthesia-severe vomiting   Simvastatin Other (See Comments)   Muscle aches   Egg White (egg Protein)    Other Reaction(s): Throat closes   Ezetimibe     Other Reaction(s): crippling muscle aches   Hydrocodone-acetaminophen     Other Reaction(s): migraines   Peanut (diagnostic)    Other Reaction(s): itchy   Shellfish Allergy Other (See Comments)   Throat swelling   Statins         Medication List     STOP taking these medications    aspirin  325 MG tablet Replaced by: aspirin  EC 81 MG tablet   cloNIDine  0.1 MG tablet Commonly known as: CATAPRES    QC  TUMERIC COMPLEX PO   sertraline  100 MG tablet Commonly known as: ZOLOFT        TAKE these medications    acetaminophen  500 MG tablet Commonly known as: TYLENOL  Take 500 mg by mouth every 6 (six) hours as needed for mild pain (pain score 1-3).   ALPRAZolam  0.5 MG tablet Commonly known as: XANAX  TAKE 1/2 TO 1 TABLET BY MOUTH TWICE DAILY AS NEEDED What changed:  how much to take how to take this when to take this reasons to take this additional instructions   apixaban  5 MG Tabs tablet Commonly known as: ELIQUIS  Take 1 tablet (5 mg total) by mouth 2 (two) times daily.   aspirin  EC 81 MG tablet Take 1 tablet (81 mg total) by mouth daily. Swallow whole. Replaces: aspirin  325 MG tablet   ceFAZolin  IVPB Commonly known as: ANCEF  Inject 2 g into the vein every 8 (eight) hours for 9 days. Indication:  MSSE bacteremia First Dose: Yes Last Day of Therapy:  07/20/23 Labs - Once weekly:  CBC/D and BMP, Labs - Once weekly: ESR and CRP Method of administration: IV Push After completion of Cefazolin   on 7/3, the patient should start linezolid  starting 7/4 for 2 additional weeks Method of administration may be changed at the discretion of home infusion pharmacist based upon assessment of the patient and/or caregiver's ability to self-administer the medication ordered.   levothyroxine  50 MCG tablet Commonly known as: SYNTHROID  Take 50 mcg by mouth daily.   linezolid  600 MG tablet Commonly known as: ZYVOX  Take 1 tablet (600 mg total) by mouth 2 (two) times daily for 14 days. Start taking on: July 21, 2023   magnesium  oxide 400 MG tablet Commonly known as: MAG-OX Take 400 mg by mouth daily.   pantoprazole  40 MG tablet Commonly known as: PROTONIX  Take 1 tablet (40 mg total) by mouth daily. Start taking on: July 15, 2023   Repatha  SureClick 140 MG/ML Soaj Generic drug: Evolocumab  INJECT 1 DOSE INTO THE SKIN EVERY 14 DAYS   spironolactone  25 MG tablet Commonly known as:  ALDACTONE  Take 1 tablet (25 mg total) by mouth daily.   telmisartan 40 MG tablet Commonly known as: MICARDIS Take 40 mg by mouth daily.   torsemide  20 MG tablet Commonly known as: DEMADEX  Take 1 tablet (20 mg total) by mouth daily.   VITAMIN D  PO Take 1 tablet by mouth daily.   zolpidem  5 MG tablet Commonly known as: AMBIEN  TAKE 1 TABLET BY MOUTH AT BEDTIME AS NEEDED FOR SLEEP               Durable Medical Equipment  (From admission, onward)           Start     Ordered   07/13/23 2014  For home use only DME 4 wheeled rolling walker with seat  Once       Question Answer Comment  Patient needs a walker to treat with the following condition Physical deconditioning   Patient needs a walker to treat with the following condition Heart failure (HCC)      07/13/23 2014   07/07/23 1714  For home use only DME 3 n 1  Once        07/07/23 1713              Discharge Care Instructions  (From admission, onward)           Start     Ordered   07/14/23 0000  Change dressing on IV access line weekly and PRN  (Home infusion instructions - [facility] )        07/14/23 1046             Outstanding Labs/Studies  Repeat CBC and BMET at follow-up visit.  Duration of Discharge Encounter: APP Time: 1 hour minutes   Signed, Callie E Goodrich, PA-C 07/14/2023, 2:15 PM   Agree with note by Aline Door, PA-C  Patient admitted in cardiogenic shock secondary [place] to RV infarct and RV failure.  She was hypotensive and underwent right left heart cath by Dr. Cherrie.  He ultimately placed an Impella RP into her pulmonary artery to unload her right ventricle.  He was able to shoot the left system but was unable to cannulate the right coronary artery which has been imaged in the past showed to be small and nondominant.  She ultimately was extubated.  Her other problems include essential hypertension, hyperlipidemia.  She became septic/bacteremic and a  PICC line was placed for IV antibiotic therapy at home.  She also had A-fib converting to sinus rhythm on Eliquis .  She was stable after being transferred out  of the intensive care unit and was ambulating without issues.  She was deemed stable for discharge.  Will arrange close outpatient follow-up.   Dorn DOROTHA Lesches, M.D., FACP, [facility], LYNITA [facility] [address] [place], [place]  [address]  [phone] 07/14/2023 2:57 PM

## 2023-07-14 NOTE — Telephone Encounter (Signed)
   Transition of Care Follow-up Phone Call Request    Patient Name: Vicki [name] Date of Birth: [birth year] Date of Encounter: 07/14/2023  Primary Care Provider:  Claudene Pellet, MD Primary Cardiologist:  None  [name] has been scheduled for a transition of care follow up appointment with a [facility] provider:  Rollo Louder, PA-C, 07/25/2023 at 1:55pm.  Please reach out to [name] within 48 hours of discharge to confirm appointment and review transition of care protocol questionnaire. Anticipated discharge date: 07/14/2023.  Levaughn Puccinelli E Georgeana Oertel, PA-C  07/14/2023, 3:44 PM

## 2023-07-14 NOTE — Plan of Care (Signed)
 Problem: Education: Goal: Knowledge of General Education information will improve Description: Including pain rating scale, medication(s)/side effects and non-pharmacologic comfort measures 07/14/2023 1855 by [place] Verdie BRAVO, RN Outcome: Adequate for Discharge 07/14/2023 0836 by [place] Verdie BRAVO, RN Outcome: Progressing   Problem: Health Behavior/Discharge Planning: Goal: Ability to manage health-related needs will improve Outcome: Adequate for Discharge   Problem: Clinical Measurements: Goal: Ability to maintain clinical measurements within normal limits will improve 07/14/2023 1855 by Berkeley Verdie BRAVO, RN Outcome: Adequate for Discharge 07/14/2023 0836 by [place] Verdie BRAVO, RN Outcome: Progressing Goal: Will remain free from infection Outcome: Adequate for Discharge Goal: Diagnostic test results will improve Outcome: Adequate for Discharge Goal: Respiratory complications will improve Outcome: Adequate for Discharge Goal: Cardiovascular complication will be avoided Outcome: Adequate for Discharge   Problem: Activity: Goal: Risk for activity intolerance will decrease 07/14/2023 1855 by Berkeley Verdie BRAVO, RN Outcome: Adequate for Discharge 07/14/2023 [phone] by Berkeley Verdie BRAVO, RN Outcome: Progressing   Problem: Nutrition: Goal: Adequate nutrition will be maintained Outcome: Adequate for Discharge   Problem: Coping: Goal: Level of anxiety will decrease Outcome: Adequate for Discharge   Problem: Elimination: Goal: Will not experience complications related to bowel motility Outcome: Adequate for Discharge Goal: Will not experience complications related to urinary retention Outcome: Adequate for Discharge   Problem: Pain Managment: Goal: General experience of comfort will improve and/or be controlled Outcome: Adequate for Discharge   Problem: Safety: Goal: Ability to remain free from injury will improve Outcome: Adequate for Discharge   Problem: Skin Integrity: Goal: Risk for  impaired skin integrity will decrease Outcome: Adequate for Discharge   Problem: Cardiac: Goal: Ability to achieve and maintain adequate cardiopulmonary perfusion will improve Outcome: Adequate for Discharge Goal: Vascular access site(s) Level 0-1 will be maintained Outcome: Adequate for Discharge   Problem: Fluid Volume: Goal: Ability to achieve a balanced intake and output will improve Outcome: Adequate for Discharge   Problem: Physical Regulation: Goal: Complications related to the disease process, condition or treatment will be avoided or minimized Outcome: Adequate for Discharge   Problem: Respiratory: Goal: Will regain and/or maintain adequate ventilation Outcome: Adequate for Discharge   Problem: Education: Goal: Ability to describe self-care measures that may prevent or decrease complications (Diabetes Survival Skills Education) will improve Outcome: Adequate for Discharge Goal: Individualized Educational Video(s) Outcome: Adequate for Discharge   Problem: Coping: Goal: Ability to adjust to condition or change in health will improve Outcome: Adequate for Discharge   Problem: Fluid Volume: Goal: Ability to maintain a balanced intake and output will improve Outcome: Adequate for Discharge   Problem: Health Behavior/Discharge Planning: Goal: Ability to identify and utilize available resources and services will improve Outcome: Adequate for Discharge Goal: Ability to manage health-related needs will improve Outcome: Adequate for Discharge   Problem: Metabolic: Goal: Ability to maintain appropriate glucose levels will improve Outcome: Adequate for Discharge   Problem: Nutritional: Goal: Maintenance of adequate nutrition will improve Outcome: Adequate for Discharge Goal: Progress toward achieving an optimal weight will improve Outcome: Adequate for Discharge   Problem: Skin Integrity: Goal: Risk for impaired skin integrity will decrease Outcome: Adequate for  Discharge   Problem: Tissue Perfusion: Goal: Adequacy of tissue perfusion will improve Outcome: Adequate for Discharge   Problem: Education: Goal: Understanding of CV disease, CV risk reduction, and recovery process will improve Outcome: Adequate for Discharge Goal: Individualized Educational Video(s) Outcome: Adequate for Discharge   Problem: Activity: Goal: Ability to return to baseline activity level will improve Outcome: Adequate  for Discharge   Problem: Cardiovascular: Goal: Ability to achieve and maintain adequate cardiovascular perfusion will improve Outcome: Adequate for Discharge Goal: Vascular access site(s) Level 0-1 will be maintained Outcome: Adequate for Discharge   Problem: Health Behavior/Discharge Planning: Goal: Ability to safely manage health-related needs after discharge will improve Outcome: Adequate for Discharge

## 2023-07-14 NOTE — TOC Progression Note (Addendum)
 Transition of Care [facility]) - Progression Note    Patient Details  Name: Vicki [name] MRN: [id number] Date of Birth: 08/23/1950  Transition of Care [facility]) CM/SW Contact  Waddell Barnie Rama, RN Phone Number: 07/14/2023, 12:05 PM  Clinical Narrative:    NCM spoke with patient at the bedside, she states she does not have her DME yet that previous NCM ordered.  NCM informed her will contact [employer] and make sure they bring it.  Also informed patient that Holley Herring will be her to do her teaching later this evening.  NCM contacted Ryan for the DME [facility] and [employer]), he states he does not have this referral but will gladly take a new one. NCM gave him the referral for the DME.     Expected Discharge Plan: Home w Home Health Services Barriers to Discharge: Continued Medical Work up  Expected Discharge Plan and Services   Discharge Planning Services: CM Consult Post Acute Care Choice: Home Health Living arrangements for the past 2 months: Single Family Home                 DME Arranged: 3-N-1, Walker rolling with seat DME Agency: [facility] Date DME Agency Contacted: 07/13/23 Time DME Agency Contacted: 2008 Representative spoke with at DME Agency: Ryan HH Arranged: RN, PT, OT [facility] Agency: [facility] Date [facility] Agency Contacted: 07/13/23 Time HH Agency Contacted: 2008 Representative spoke with at [facility] Agency: Darleene   Social Determinants of Health (SDOH) Interventions SDOH Screenings   Food Insecurity: No Food Insecurity (06/28/2023)  Housing: Low Risk  (06/28/2023)  Transportation Needs: No Transportation Needs (06/28/2023)  Utilities: Not At Risk (06/28/2023)  Social Connections: Socially Integrated (06/28/2023)  Tobacco Use: Medium Risk (06/27/2023)    Readmission Risk Interventions     No data to display

## 2023-07-14 NOTE — Progress Notes (Signed)
 Mobility Specialist Progress Note:   07/14/23 1345  Mobility  Activity Ambulated with assistance in hallway  Level of Assistance Contact guard assist, steadying assist  Assistive Device Four wheel walker  Distance Ambulated (ft) 150 ft  Activity Response Tolerated well  Mobility Referral Yes  Mobility visit 1 Mobility  Mobility Specialist Start Time (ACUTE ONLY) 1345  Mobility Specialist Stop Time (ACUTE ONLY) 1406  Mobility Specialist Time Calculation (min) (ACUTE ONLY) 21 min   Pt agreeable to session. CGA, Rollator.  Started w/ ambulation to the bathroom before transitioning to hallway. Speed was slow and stiff but steady. Pt felt as though her body was stiff from not moving for two weeks but enjoyed the walk and being up. No c/o symptoms. Left pt on EOB comfortable w/ no needs.   Therisa Rana Mobility Specialist Please contact via SecureChat or  Rehab office at [phone]

## 2023-07-14 NOTE — TOC Transition Note (Signed)
 Transition of Care [facility]) - Discharge Note   Patient Details  Name: Vicki [name] MRN: [id number] Date of Birth: 05-06-1950  Transition of Care [facility]) CM/SW Contact:  Waddell Barnie Rama, RN Phone Number: 07/14/2023, 12:16 PM   Clinical Narrative:    For dc today, Pam Dozier with [employer] notified and Cindie with [facility] notified.  Patient has transport home. [employer] to bring DME to room prior to dc.     Barriers to Discharge: Continued Medical Work up   Patient Goals and CMS Choice Patient states their goals for this hospitalization and ongoing recovery are:: wants to get better CMS Medicare.gov Compare Post Acute Care list provided to:: Patient Choice offered to / list presented to : Patient      Discharge Placement                       Discharge Plan and Services Additional resources added to the After Visit Summary for     Discharge Planning Services: CM Consult Post Acute Care Choice: Home Health          DME Arranged: 3-N-1, Walker rolling with seat DME Agency: [facility] Date DME Agency Contacted: 07/13/23 Time DME Agency Contacted: 2008 Representative spoke with at DME Agency: Ryan HH Arranged: RN, PT, OT [facility] Agency: [facility] Date [facility] Agency Contacted: 07/13/23 Time HH Agency Contacted: 2008 Representative spoke with at [facility] Agency: Darleene  Social Drivers of Health (SDOH) Interventions SDOH Screenings   Food Insecurity: No Food Insecurity (06/28/2023)  Housing: Low Risk  (06/28/2023)  Transportation Needs: No Transportation Needs (06/28/2023)  Utilities: Not At Risk (06/28/2023)  Social Connections: Socially Integrated (06/28/2023)  Tobacco Use: Medium Risk (06/27/2023)     Readmission Risk Interventions     No data to display

## 2023-07-14 NOTE — Plan of Care (Signed)
 Pt discharged from unit via wheelchair. Belongings and medications in hand. Pt indicates understanding of care plan and discharge instructions.

## 2023-07-14 NOTE — Progress Notes (Signed)
 Peripherally Inserted Central Catheter Placement  The IV Nurse has discussed with the patient and/or persons authorized to consent for the patient, the purpose of this procedure and the potential benefits and risks involved with this procedure.  The benefits include less needle sticks, lab draws from the catheter, and the patient may be discharged home with the catheter. Risks include, but not limited to, infection, bleeding, blood clot (thrombus formation), and puncture of an artery; nerve damage and irregular heartbeat and possibility to perform a PICC exchange if needed/ordered by physician.  Alternatives to this procedure were also discussed.  Bard Power PICC patient education guide, fact sheet on infection prevention and patient information card has been provided to patient /or left at bedside.    PICC Placement Documentation  PICC Single Lumen 07/14/23 Left Basilic 43 cm 1 cm (Active)  Indication for Insertion or Continuance of Line Home intravenous therapies (PICC only) 07/14/23 0900  Exposed Catheter (cm) 1 cm 07/14/23 0900  Site Assessment Clean, Dry, Intact 07/14/23 0900  Line Status Flushed;Saline locked;Blood return noted 07/14/23 0900  Dressing Type Transparent;Securing device 07/14/23 0900  Dressing Status Antimicrobial disc/dressing in place;Clean, Dry, Intact 07/14/23 0900  Line Care Connections checked and tightened 07/14/23 0900  Line Adjustment (NICU/IV Team Only) No 07/14/23 0900  Dressing Intervention New dressing;Adhesive placed at insertion site (IV team only) 07/14/23 0900  Dressing Change Due 07/21/23 07/14/23 0900       Ethyl Priestly Renee 07/14/2023, 9:52 AM

## 2023-07-15 ENCOUNTER — Other Ambulatory Visit (HOSPITAL_COMMUNITY): Payer: Self-pay

## 2023-07-15 DIAGNOSIS — R001 Bradycardia, unspecified: Secondary | ICD-10-CM | POA: Diagnosis not present

## 2023-07-15 NOTE — Progress Notes (Deleted)
 Cardiology Office Note   Date:  07/15/2023  ID:  Vicki, [name] [birth year], MRN [id number] PCP: Claudene Pellet, MD  [facility] Providers Cardiologist:  Vinie JAYSON Maxcy, MD   History of Present Illness Vicki [name] is a 73 y.o. 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 She is followed by Dr. Maxcy and presents today for a hospital follow up appointment   Patient previously had echocardiogram 05/2014 that showed EF 60-65%, no regional wall motion abnormalities, grade I DD. Cardiac catheterization in 2017 showed near normal coronary arteries   Patient recently admitted to [facility] in 06/2023. Presented 6/10 with 3-day history of chest pain that radiated to her back. In the ED, she was noted to be hypotensive with systolic BP in the 60s. Also found to be in junctional bradycardia with HR in the 40s and transient atrial flutter. hsTn elevated at 5300, lactic acid 5.3. Emergent CTA negative for PE, showed irregular atheromatous plaque along the later wall of the mid and distal descending thoracic aorta with a small ulcerated plaque and developing intramural hematoma measuring 8 mm in maximal thickness by 14 mm in maximum AP diameter.  She was started on Norepinephrine  and admitted for cardiogenic shock. She underwent R/L heart cath 6/10 that showed normal coronary arteries, LVEF 65%, evidence of severe RV failure with profound shock requiring placement of impella support device. Echocardiogram 6/10 showed EF 60-65%, hyperdynamic apex, could not rule out RV stain. She was treated with norepinephrine  and dobutamine  and was diuresed with IV lasi and one dose of metolazone . Impella removed on 6/15. Able to be weaned off norepinephrine  6/18.   During her admission, patient was initially noted to be going in and out of atrial fibrillation with HR in the 120s-130s. However, she  also had significant bradycardia/junctional rhythm with HR as low as the 30s. EP was consulted and she had a temporary transvenous pacemaker on 6/18. Per EP, bradycardia appeared to be mainly sinus node dysfunction, but the sins know recovered. Preferred to avoid PPM placement as patient was bacteremic and on IV antibiotics. She spiked a fever of >102, blood cultures were positive for staph epi. ID was consulted and she was treated wit antibiotics. TEE negative for endocarditis.   Patient was ultimately on discharged on torsemide  20 mg daily, spironolactone  25 mg daily for diastolic heart failure. She remained on asa 81 mg daily, repatha  for NSTEMI. Eliquis  for afib, and discharged with a 2 week live monitor for bradycardia.   NSTEMI with RV Infarct  Cardiogenic Shock Secondary to Profound RV Failure Acute Diastolic CHF - Patient was recently admitted with NSTEMI and cardiogenic shock secondary to RV infarct on 06/27/2023. High sensitive troponin peaked peaked at 6, 049.  Lactic acid was initially 5.3 and then trended down. LHC 6/10 showed normal left dominant system with nonvisualized tiny RCA and evidence of severe RV failure with profound shock requiring placement of Impella RP support device. CTA was negative for PE.  Echo showed LVEF of 65 to 65% and mildly reduced RV with hyperdynamic apex.  She was initially treated with Norepinephrine  and Dobutamine  entire reached with IV Lasix  and 1 dose of Metolazone .  Impella was removed on 07/02/2023 and she was weaned off pressors  -  - Continue Torsemide  20mg  daily and Spironolactone  25mg  daily - Ordered CMP for medication monitoring  - Continue ASA 81 mg daily and Repatha . No beta-blocker  given sinus node dysfunction.  Tachy-Brady Syndrome Paroxysmal Atrial Fibrillation Sinus Node Dysfunction - During recent admission, patient was initially noted to be going in and out of atrial fibrillation with rates in the 120s to 130s. However, she also had  significant bradycardia/junctional rhythm with rates as low as the 30s to 40s.  EP was consulted and she had a temporary transvenous pacemaker placed on 07/05/2023.  Per EP, bradycardia appeared to be mainly sinus node dysfunction but sinus node has recovered.  Given bacteremia and need for prolonged IV antibiotics, wanted to avoid a permanent pacemaker if possible.  Thankfully, sinus node recovered and TVP was able to be removed.   - Patient was discharged with a 2 week live zio-  - Continue  Eliquis  5 mg twice daily - Ordered CBC  - Follow up with EP scheduled for 7/29    Aortic Ulceration - CTA on admission showed distal descending thoracic aorta with a small ulcerated plaque and developing intramural hematoma measuring 8 mm in maximal thickness by 14 mm in maximum AP diameter.  - Reviewed by Vascular Surgery and 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.   Hypertension -  - continue telmisartan 40 mg daily, spironolactone  25 mg daily  - Ordered CMP for medication monitoring    Hyperlipidemia - Intolerant to statins in the past. Continue Repatha     Bacteremia - During recent admission, patient spiked a fever of > 102 on 6/17 and WBCs peaked at 15.4.  Blood cultures were positive for Staph Epi.  ID was consulted patient was started on antibiotics.  TEE was negative for endocarditis and bacteremia was felt to be secondary to joint lines.  PICC line was removed on 6/24.  Per Dr. Ula, plan is for 2 weeks of IV Cefazolin  which patient will finish on 07/20/2023 and then 2 weeks of PO Linezolid .  Total duration 4 weeks because of the ulcerated plaque in her aorta and desire to be aggressive  - Seen by ID on 7/3-    Thrombocytopenia   - Platelets dropped as low as 56,000 on 06/30/2023. IV Heparin  was stopped and she was switched to Bivalrudin with improvement. SRA negative. Platelets normalized and were 287 on discharge. - Ordered CBC for monitoring    AKI - Resolved  Shock Liver - Resolved - During recent  admission, had resolved prior to DC  - Ordered CMP    ROS: ***  Studies Reviewed      *** Risk Assessment/Calculations {Does this patient have ATRIAL FIBRILLATION?:[phone]}         Physical Exam VS:  LMP 07/18/2011        Wt Readings from Last 3 Encounters:  07/14/23 156 lb 1.4 oz (70.8 kg)  01/24/23 174 lb 12.8 oz (79.3 kg)  08/02/22 169 lb 11.2 oz (77 kg)    GEN: Well nourished, well developed in no acute distress NECK: No JVD; No carotid bruits CARDIAC: ***RRR, no murmurs, rubs, gallops RESPIRATORY:  Clear to auscultation without rales, wheezing or rhonchi  ABDOMEN: Soft, non-tender, non-distended EXTREMITIES:  No edema; No deformity   ASSESSMENT AND PLAN *** {The patient has an active order for outpatient cardiac rehabilitation.   Please indicate if the patient is ready to start. Do NOT delete this.  It will auto delete.  Refresh note, then sign.              Click here to document readiness and see contraindications.  :1}  Cardiac Rehabilitation Eligibility Assessment      {Are you  ordering a CV Procedure (e.g. stress test, cath, DCCV, TEE, etc)?   Press F2        :[id number]}  Dispo: ***  Signed, Rollo FABIENE Louder, PA-C

## 2023-07-17 ENCOUNTER — Other Ambulatory Visit (HOSPITAL_COMMUNITY): Payer: Self-pay

## 2023-07-17 ENCOUNTER — Ambulatory Visit: Payer: PPO | Admitting: Psychiatry

## 2023-07-17 DIAGNOSIS — G43109 Migraine with aura, not intractable, without status migrainosus: Secondary | ICD-10-CM | POA: Diagnosis not present

## 2023-07-17 DIAGNOSIS — K219 Gastro-esophageal reflux disease without esophagitis: Secondary | ICD-10-CM | POA: Diagnosis not present

## 2023-07-17 DIAGNOSIS — A411 Sepsis due to other specified staphylococcus: Secondary | ICD-10-CM | POA: Diagnosis not present

## 2023-07-17 DIAGNOSIS — E039 Hypothyroidism, unspecified: Secondary | ICD-10-CM | POA: Diagnosis not present

## 2023-07-17 DIAGNOSIS — F419 Anxiety disorder, unspecified: Secondary | ICD-10-CM | POA: Diagnosis not present

## 2023-07-17 DIAGNOSIS — I495 Sick sinus syndrome: Secondary | ICD-10-CM | POA: Diagnosis not present

## 2023-07-17 DIAGNOSIS — Z48812 Encounter for surgical aftercare following surgery on the circulatory system: Secondary | ICD-10-CM | POA: Diagnosis not present

## 2023-07-17 DIAGNOSIS — I214 Non-ST elevation (NSTEMI) myocardial infarction: Secondary | ICD-10-CM | POA: Diagnosis not present

## 2023-07-17 DIAGNOSIS — F4310 Post-traumatic stress disorder, unspecified: Secondary | ICD-10-CM | POA: Diagnosis not present

## 2023-07-17 DIAGNOSIS — I480 Paroxysmal atrial fibrillation: Secondary | ICD-10-CM | POA: Diagnosis not present

## 2023-07-17 DIAGNOSIS — A4101 Sepsis due to Methicillin susceptible Staphylococcus aureus: Secondary | ICD-10-CM | POA: Diagnosis not present

## 2023-07-17 DIAGNOSIS — M549 Dorsalgia, unspecified: Secondary | ICD-10-CM | POA: Diagnosis not present

## 2023-07-17 DIAGNOSIS — G4700 Insomnia, unspecified: Secondary | ICD-10-CM | POA: Diagnosis not present

## 2023-07-17 DIAGNOSIS — G8929 Other chronic pain: Secondary | ICD-10-CM | POA: Diagnosis not present

## 2023-07-17 DIAGNOSIS — I110 Hypertensive heart disease with heart failure: Secondary | ICD-10-CM | POA: Diagnosis not present

## 2023-07-17 DIAGNOSIS — I5031 Acute diastolic (congestive) heart failure: Secondary | ICD-10-CM | POA: Diagnosis not present

## 2023-07-17 DIAGNOSIS — M797 Fibromyalgia: Secondary | ICD-10-CM | POA: Diagnosis not present

## 2023-07-17 DIAGNOSIS — I7123 Aneurysm of the descending thoracic aorta, without rupture: Secondary | ICD-10-CM | POA: Diagnosis not present

## 2023-07-17 DIAGNOSIS — D649 Anemia, unspecified: Secondary | ICD-10-CM | POA: Diagnosis not present

## 2023-07-17 DIAGNOSIS — E785 Hyperlipidemia, unspecified: Secondary | ICD-10-CM | POA: Diagnosis not present

## 2023-07-17 DIAGNOSIS — Z91199 Patient's noncompliance with other medical treatment and regimen due to unspecified reason: Secondary | ICD-10-CM

## 2023-07-17 DIAGNOSIS — T80211A Bloodstream infection due to central venous catheter, initial encounter: Secondary | ICD-10-CM | POA: Diagnosis not present

## 2023-07-17 DIAGNOSIS — I10 Essential (primary) hypertension: Secondary | ICD-10-CM | POA: Diagnosis not present

## 2023-07-17 DIAGNOSIS — F32A Depression, unspecified: Secondary | ICD-10-CM | POA: Diagnosis not present

## 2023-07-17 DIAGNOSIS — K7689 Other specified diseases of liver: Secondary | ICD-10-CM | POA: Diagnosis not present

## 2023-07-17 DIAGNOSIS — M4800 Spinal stenosis, site unspecified: Secondary | ICD-10-CM | POA: Diagnosis not present

## 2023-07-17 DIAGNOSIS — I50811 Acute right heart failure: Secondary | ICD-10-CM | POA: Diagnosis not present
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
## 2023-07-17 NOTE — Telephone Encounter (Signed)
 Transition Care Management Unsuccessful Follow-up Telephone Call  Date of discharge and from where:  on 07/14/23 from [facility]  Attempts:  1st Attempt  Reason for unsuccessful TCM follow-up call:  Voice mail full

## 2023-07-18 NOTE — Telephone Encounter (Signed)
 Transition Care Management Unsuccessful Follow-up Telephone Call   Date of discharge and from where:  on 07/14/23 from [facility]   Attempts: 2nd Attempt   Reason for unsuccessful TCM follow-up call:  Voice mail full

## 2023-07-20 ENCOUNTER — Inpatient Hospital Stay: Payer: Self-pay | Admitting: Infectious Diseases

## 2023-07-20 DIAGNOSIS — Z79899 Other long term (current) drug therapy: Secondary | ICD-10-CM | POA: Diagnosis not present

## 2023-07-20 NOTE — Telephone Encounter (Signed)
 Transition Care Management Unsuccessful Follow-up Telephone Call  Date of discharge and from where:  on 07/14/23 from [facility]  Attempts:  3rd Attempt  Reason for unsuccessful TCM follow-up call:  Voice mail full

## 2023-07-23 ENCOUNTER — Emergency Department (HOSPITAL_COMMUNITY)

## 2023-07-23 ENCOUNTER — Observation Stay (HOSPITAL_COMMUNITY)

## 2023-07-23 ENCOUNTER — Encounter (HOSPITAL_COMMUNITY): Payer: Self-pay

## 2023-07-23 ENCOUNTER — Observation Stay (HOSPITAL_COMMUNITY)
Admission: EM | Admit: 2023-07-23 | Discharge: 2023-07-25 | Disposition: A | Discharge status: Home / Self Care | Attending: Internal Medicine | Admitting: Internal Medicine

## 2023-07-23 ENCOUNTER — Other Ambulatory Visit: Payer: Self-pay

## 2023-07-23 DIAGNOSIS — R079 Chest pain, unspecified: Secondary | ICD-10-CM | POA: Diagnosis not present

## 2023-07-23 DIAGNOSIS — Z981 Arthrodesis status: Secondary | ICD-10-CM

## 2023-07-23 DIAGNOSIS — F419 Anxiety disorder, unspecified: Secondary | ICD-10-CM | POA: Diagnosis not present

## 2023-07-23 DIAGNOSIS — R0789 Other chest pain: Principal | ICD-10-CM | POA: Insufficient documentation

## 2023-07-23 DIAGNOSIS — F32A Depression, unspecified: Secondary | ICD-10-CM | POA: Diagnosis not present

## 2023-07-23 DIAGNOSIS — Z8673 Personal history of transient ischemic attack (TIA), and cerebral infarction without residual deficits: Secondary | ICD-10-CM | POA: Insufficient documentation

## 2023-07-23 DIAGNOSIS — Z91010 Allergy to peanuts: Secondary | ICD-10-CM | POA: Insufficient documentation

## 2023-07-23 DIAGNOSIS — I495 Sick sinus syndrome: Secondary | ICD-10-CM | POA: Diagnosis not present

## 2023-07-23 DIAGNOSIS — R0601 Orthopnea: Secondary | ICD-10-CM

## 2023-07-23 DIAGNOSIS — M4626 Osteomyelitis of vertebra, lumbar region: Secondary | ICD-10-CM | POA: Diagnosis not present

## 2023-07-23 DIAGNOSIS — I5031 Acute diastolic (congestive) heart failure: Secondary | ICD-10-CM

## 2023-07-23 DIAGNOSIS — F4310 Post-traumatic stress disorder, unspecified: Secondary | ICD-10-CM | POA: Diagnosis not present

## 2023-07-23 DIAGNOSIS — N179 Acute kidney failure, unspecified: Secondary | ICD-10-CM | POA: Insufficient documentation

## 2023-07-23 DIAGNOSIS — F1010 Alcohol abuse, uncomplicated: Secondary | ICD-10-CM | POA: Diagnosis present

## 2023-07-23 DIAGNOSIS — I480 Paroxysmal atrial fibrillation: Secondary | ICD-10-CM | POA: Diagnosis not present

## 2023-07-23 DIAGNOSIS — R0602 Shortness of breath: Secondary | ICD-10-CM | POA: Diagnosis not present

## 2023-07-23 DIAGNOSIS — E7801 Familial hypercholesterolemia: Secondary | ICD-10-CM

## 2023-07-23 DIAGNOSIS — Z7982 Long term (current) use of aspirin: Secondary | ICD-10-CM | POA: Insufficient documentation

## 2023-07-23 DIAGNOSIS — E785 Hyperlipidemia, unspecified: Secondary | ICD-10-CM | POA: Diagnosis present

## 2023-07-23 DIAGNOSIS — I10 Essential (primary) hypertension: Secondary | ICD-10-CM | POA: Diagnosis not present

## 2023-07-23 DIAGNOSIS — R42 Dizziness and giddiness: Secondary | ICD-10-CM | POA: Diagnosis not present

## 2023-07-23 DIAGNOSIS — E039 Hypothyroidism, unspecified: Secondary | ICD-10-CM | POA: Diagnosis not present

## 2023-07-23 DIAGNOSIS — I50811 Acute right heart failure: Secondary | ICD-10-CM | POA: Diagnosis not present

## 2023-07-23 DIAGNOSIS — R570 Cardiogenic shock: Secondary | ICD-10-CM | POA: Diagnosis not present

## 2023-07-23 DIAGNOSIS — R110 Nausea: Secondary | ICD-10-CM | POA: Diagnosis not present

## 2023-07-23 DIAGNOSIS — B957 Other staphylococcus as the cause of diseases classified elsewhere: Secondary | ICD-10-CM | POA: Diagnosis not present

## 2023-07-23 DIAGNOSIS — R9389 Abnormal findings on diagnostic imaging of other specified body structures: Secondary | ICD-10-CM | POA: Diagnosis not present

## 2023-07-23 DIAGNOSIS — R001 Bradycardia, unspecified: Secondary | ICD-10-CM | POA: Diagnosis not present

## 2023-07-23 HISTORY — DX: Acute kidney failure, unspecified: N17.9

## 2023-07-23 LAB — BASIC METABOLIC PANEL WITH GFR
Anion gap: 10 (ref 5–15)
BUN: 13 mg/dL (ref 8–23)
CO2: 27 mmol/L (ref 22–32)
Calcium: 9.4 mg/dL (ref 8.9–10.3)
Chloride: 96 mmol/L — ABNORMAL LOW (ref 98–111)
Creatinine, Ser: 1.22 mg/dL — ABNORMAL HIGH (ref 0.44–1.00)
GFR, Estimated: 47 mL/min — ABNORMAL LOW (ref >60)
Glucose, Bld: 106 mg/dL — ABNORMAL HIGH (ref 70–99)
Potassium: 4 mmol/L (ref 3.5–5.1)
Sodium: 133 mmol/L — ABNORMAL LOW (ref 135–145)

## 2023-07-23 LAB — CBC
HCT: 34.7 % — ABNORMAL LOW (ref 36.0–46.0)
Hemoglobin: 11.6 g/dL — ABNORMAL LOW (ref 12.0–15.0)
MCH: 30.8 pg (ref 26.0–34.0)
MCHC: 33.4 g/dL (ref 30.0–36.0)
MCV: 92 fL (ref 80.0–100.0)
Platelets: 195 K/uL (ref 150–400)
RBC: 3.77 MIL/uL — ABNORMAL LOW (ref 3.87–5.11)
RDW: 13 % (ref 11.5–15.5)
WBC: 5.8 K/uL (ref 4.0–10.5)
nRBC: 0 % (ref 0.0–0.2)

## 2023-07-23 LAB — MAGNESIUM: Magnesium: 2 mg/dL (ref 1.7–2.4)

## 2023-07-23 LAB — BRAIN NATRIURETIC PEPTIDE: B Natriuretic Peptide: 137 pg/mL — ABNORMAL HIGH (ref 0.0–100.0)

## 2023-07-23 LAB — TROPONIN I (HIGH SENSITIVITY)
Troponin I (High Sensitivity): 24 ng/L — ABNORMAL HIGH (ref <18)
Troponin I (High Sensitivity): 27 ng/L — ABNORMAL HIGH (ref <18)

## 2023-07-23 LAB — ECHOCARDIOGRAM LIMITED
AR max vel: 2.46 cm2
AV Peak grad: 5.8 mmHg
Ao pk vel: 1.2 m/s
Area-P 1/2: 2.6 cm2
Height: 63 in
S' Lateral: 1.9 cm
Weight: 2493.84 [oz_av]

## 2023-07-23 LAB — HEPATIC FUNCTION PANEL
ALT: 10 U/L (ref 0–44)
AST: 26 U/L (ref 15–41)
Albumin: 3.3 g/dL — ABNORMAL LOW (ref 3.5–5.0)
Alkaline Phosphatase: 56 U/L (ref 38–126)
Bilirubin, Direct: <0.1 mg/dL (ref 0.0–0.2)
Total Bilirubin: 0.4 mg/dL (ref 0.0–1.2)
Total Protein: 6.8 g/dL (ref 6.5–8.1)

## 2023-07-23 LAB — I-STAT CG4 LACTIC ACID, ED
Lactic Acid, Venous: 1.4 mmol/L (ref 0.5–1.9)
Lactic Acid, Venous: 0.8 mmol/L (ref 0.5–1.9)

## 2023-07-23 MED ORDER — ALPRAZOLAM 0.25 MG PO TABS: 0.5000 mg | ORAL_TABLET | Freq: Every day | ORAL | Status: DC | PRN

## 2023-07-23 MED ORDER — ALPRAZOLAM 0.5 MG PO TABS: 0.5000 mg | ORAL_TABLET | Freq: Every day | ORAL | Status: DC | PRN

## 2023-07-23 MED ORDER — ACETAMINOPHEN 325 MG PO TABS
650.0000 mg | ORAL_TABLET | Freq: Four times a day (QID) | ORAL | Status: DC | PRN
Start: 2023-07-23 — End: 2023-07-25

## 2023-07-23 MED ORDER — ONDANSETRON HCL 4 MG/2ML IJ SOLN
4.0000 mg | Freq: Once | INTRAMUSCULAR | Status: AC
  Administered 2023-07-23: 4 mg via INTRAVENOUS
  Filled 2023-07-23: qty 2

## 2023-07-23 MED ORDER — POLYETHYLENE GLYCOL 3350 17 G PO PACK: 17.0000 g | PACK | Freq: Every day | ORAL | Status: DC | PRN

## 2023-07-23 MED ORDER — ACETAMINOPHEN 650 MG RE SUPP: 650.0000 mg | Freq: Four times a day (QID) | RECTAL | Status: DC | PRN

## 2023-07-23 MED ORDER — AMIODARONE HCL IN DEXTROSE 360-4.14 MG/200ML-% IV SOLN
INTRAVENOUS | Status: AC
  Filled 2023-07-23: qty 200

## 2023-07-23 MED ORDER — APIXABAN 5 MG PO TABS
5.0000 mg | ORAL_TABLET | Freq: Two times a day (BID) | ORAL | Status: DC
Start: 2023-07-23 — End: 2023-07-25
  Administered 2023-07-23 – 2023-07-25 (×4): 5 mg via ORAL
  Filled 2023-07-23 (×4): qty 1

## 2023-07-23 MED ORDER — PANTOPRAZOLE SODIUM 40 MG PO TBEC
40.0000 mg | DELAYED_RELEASE_TABLET | Freq: Every day | ORAL | Status: DC
  Administered 2023-07-24 – 2023-07-25 (×2): 40 mg via ORAL
  Filled 2023-07-23 (×2): qty 1

## 2023-07-23 MED ORDER — LEVOTHYROXINE SODIUM 50 MCG PO TABS
50.0000 ug | ORAL_TABLET | Freq: Every day | ORAL | Status: DC
  Administered 2023-07-24 – 2023-07-25 (×2): 50 ug via ORAL
  Filled 2023-07-23 (×2): qty 1

## 2023-07-23 MED ORDER — ASPIRIN 81 MG PO TBEC
81.0000 mg | DELAYED_RELEASE_TABLET | Freq: Every day | ORAL | Status: DC
  Administered 2023-07-24 – 2023-07-25 (×2): 81 mg via ORAL
  Filled 2023-07-23 (×2): qty 1

## 2023-07-23 MED ORDER — ALPRAZOLAM 0.25 MG PO TABS
0.5000 mg | ORAL_TABLET | Freq: Once | ORAL | Status: AC
  Administered 2023-07-23: 0.5 mg via ORAL
  Filled 2023-07-23: qty 2

## 2023-07-23 MED ORDER — LINEZOLID 600 MG PO TABS
600.0000 mg | ORAL_TABLET | Freq: Two times a day (BID) | ORAL | Status: DC
  Administered 2023-07-23 – 2023-07-25 (×4): 600 mg via ORAL
  Filled 2023-07-23 (×6): qty 1

## 2023-07-23 MED ORDER — SODIUM CHLORIDE 0.9% FLUSH
3.0000 mL | Freq: Two times a day (BID) | INTRAVENOUS | Status: DC
  Administered 2023-07-23 – 2023-07-25 (×4): 3 mL via INTRAVENOUS

## 2023-07-23 MED ORDER — IRBESARTAN 300 MG PO TABS: 150.0000 mg | ORAL_TABLET | Freq: Every day | ORAL | Status: DC

## 2023-07-23 NOTE — Consult Note (Addendum)
 Cardiology Consultation   Patient ID: ILARIA MUCH MRN: [id number]; DOB: [birth year]  Admit date: 07/23/2023 Date of Consult: 07/23/2023  PCP:  Claudene Pellet, MD   [facility] Providers Cardiologist:  Vinie JAYSON Maxcy, MD        Patient Profile: Vicki [name] is a 73 y.o. 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  History of Present Illness: Vicki [name] was just discharged on 6/27 after a very complex admission. She presented on 06/27/2023 with a 3-day history of severe substernal chest pain with radiation to her back that she described as a tearing pain.  On arrival to the ED, she was noted to be hypotensive with systolic BP in the 60s and a junctional bradycardia with rates in the 40s with transient atrial flutter.  EKG showed no ST elevation.  High-sensitivity troponin was elevated at 5,300 lactic acid was 5.3.  Emergent CTA was negative for PE but showed irregular atheromatous plaque along the lateral wall of the mid and distal descending thoracic aorta with a small ulcerated plaque and developing intramural hematoma measuring 8 mm in maximal thickness by 14 mm in maximum AP diameter.  She was started on Norepinephrine  and admitted for cardiogenic shock. High sensitive troponin peaked peaked at 6, 049. Lactic acid was initially 5.3 and then trended down. LHC showed normal left dominant system with nonvisualized tiny RCA and evidence of severe RV failure with profound shock requiring placement of Impella RP support device. Echo showed LVEF of 65 to 65% and mildly reduced RV with hyperdynamic apex.  She was initially treated with Norepinephrine  and Dobutamine  entire reached with IV Lasix  and 1 dose of  Metolazone .  Impella was removed on 07/02/2023. Norepinephrine  was able to be weaned off on 6/18 and Dobutamine  was stopped on 6/20.   Patient initially noted to be going in and out of atrial fibrillation with rates in the 120s to 130s; however, she also had significant bradycardia/junctional rhythm with rates as low as the 30s to 40s.  EP was consulted and she had a temporary transvenous pacemaker placed on 07/05/2023.  Per EP, bradycardia appeared to be mainly sinus node dysfunction but sinus node has recovered.  Given bacteremia and need for prolonged IV antibiotics, wanted to avoid a permanent pacemaker if possible.  Fortunately, sinus node recovered and TVP was able to be removed.  EP recommended placing a 2-week live ZIO monitor prior to discharge.  She initially on anticoagulation with IV heparin  was started on Eliquis  5 mg twice daily prior to discharge given atrial fibrillation.   Her recent admission further complicated by fever and leukocytosis. Blood cultures were positive for Staph Epi.  ID was consulted patient was started on antibiotics.  TEE was negative for endocarditis and bacteremia was felt to be secondary to joint lines.  PICC line was removed on 6/24. Per ID, patient was to complete 2 weeks of IV Cefazolin  and then 2 weeks of Linezolid . Total duration 4 weeks because of ulcerated plaque in her aorta (CTA on admission showed distal descending thoracic aorta with a small ulcerated plaque and developing intramural hematoma measuring 8 mm in maximal thickness by 14 mm in maximum AP diameter. Reviewed by Vascular Surgery and 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).   Lastly,  patient with AKI and shock liver during her admission. Both resolved prior to discharge.   Patient was brought to the ED by EMS today after she developed shortness of breath and chest discomfort this morning while ambulating to the bathroom. These symptoms were associated with a sensation of dizziness.  Patient further clarifies that she  actually began to notice exertional dyspnea with increasing chest discomfort and dizziness last night.  Patient says that since leaving the hospital she has continued to have intermittent chest discomfort.  However overall she did feel like she was doing better for period of time.  Patient's husband is at bedside and reports that he has noticed patient increasingly preferring to sleep in an upright position.  Patient has escalated from 2 pillows at night to 4 pillows at night due to symptoms consistent with orthopnea.  Although she has had this orthopnea, patient denies recurrent peripheral edema as previously noted during recent admission.  She also reports generally stable weight.  Denies changes to urine output and confirms compliance with her guideline directed medical therapy and diuretic regimen.  Denies significant salt intake.    Past Medical History:  Diagnosis Date   Alcohol intoxication (HCC) 05/2014    ED note    Anxiety    Arthritis    Boils    Chronic back pain    Community acquired pneumonia 01/05/2016   Depression    Fibromyalgia    GERD (gastroesophageal reflux disease)    Heart attack (HCC)    Hepatic cyst 01/06/2016   Hyperlipidemia    Hypertension    Insomnia    Leg cramps    Migraines    with aura   Mini stroke 05/2014   with paralysis for 1 hr   Osteopenia    PONV (postoperative nausea and vomiting)    severe   Postmenopausal bleeding 12/2015   PTSD (post-traumatic stress disorder)    S/P epidural steroid injection    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, passed out, 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 INVASIVE CV LAB;  Service: Cardiovascular;  Laterality: N/A;   COLONOSCOPY     DILATATION & CURETTAGE/HYSTEROSCOPY WITH MYOSURE N/A 02/22/2016   Procedure: DILATATION & CURETTAGE/HYSTEROSCOPY WITH MYOSURE;  Surgeon: Bobie FORBES Cathlyn JAYSON Nikki, MD;  Location: [facility] ORS;  Service: Gynecology;  Laterality: N/A;   FACIAL COSMETIC SURGERY     HAND SURGERY     nerve & tendon repair   LIPOSUCTION     LUMBAR EPIDURAL INJECTION  06/16/2015   RIGHT/LEFT HEART CATH AND CORONARY ANGIOGRAPHY N/A 06/27/2023   Procedure: RIGHT/LEFT HEART CATH AND CORONARY ANGIOGRAPHY;  Surgeon: Cherrie Toribio SAUNDERS, MD;  Location: [facility] INVASIVE CV LAB;  Service: Cardiovascular;  Laterality: N/A;   SKIN BIOPSY Right 02/22/2016   Procedure: BIOPSY SKIN;  Surgeon: Bobie FORBES Cathlyn JAYSON Nikki, MD;  Location: [facility] OR[facility];  Service: Gynecology;  Laterality: Right;   SQUAMOUS CELL CARCINOMA EXCISION     TEMPORARY PACEMAKER N/A 07/05/2023   Procedure: TEMPORARY PACEMAKER;  Surgeon: Zenaida Morene PARAS, MD;  Location: [facility] INVASIVE CV LAB;  Service: Cardiovascular;  Laterality: N/A;   VENTRICULAR ASSIST DEVICE INSERTION N/A 06/27/2023   Procedure: VENTRICULAR ASSIST DEVICE INSERTION;  Surgeon: Cherrie Toribio SAUNDERS, MD;  Location: [facility] INVASIVE CV LAB;  Service: Cardiovascular;  Laterality: N/A;   WISDOM TOOTH EXTRACTION       Home Medications:  Prior to Admission medications   Medication Sig Start Date End Date Taking? Authorizing Provider  acetaminophen  (TYLENOL ) 500 MG tablet Take 500 mg by mouth every 6 (six) hours as needed for mild pain (pain score 1-3).    [provider]  ALPRAZolam  (XANAX ) 0.5 MG tablet TAKE 1/2 TO 1 TABLET BY MOUTH TWICE DAILY AS NEEDED Patient taking differently: Take 0.5 mg by mouth daily as needed for anxiety. 04/25/22   Cottle, Lorene KANDICE Raddle., MD  apixaban  (ELIQUIS ) 5 MG TABS tablet Take 1 tablet (5 mg total) by mouth 2 (two) times daily. 07/14/23   Goodrich,  Callie E, [name]  aspirin  EC 81 MG tablet Take 1 tablet (81 mg total) by mouth daily. Swallow whole. 07/14/23   Goodrich, Callie E, [name]  ceFAZolin  (ANCEF ) IVPB Inject 2 g into the vein every 8 (eight) hours for 9 days. Indication:  MSSE bacteremia First Dose: Yes Last Day of Therapy:  07/20/23 Labs - Once weekly:  CBC/D and BMP, Labs - Once weekly: ESR and CRP Method of administration: IV Push After completion of Cefazolin  on 7/3, the patient should start linezolid  starting 7/4 for 2 additional weeks Method of administration may be changed at the discretion of home infusion pharmacist based upon assessment of the patient and/or caregiver's ability to self-administer the medication ordered. 07/14/23 07/23/23  Goodrich, Callie E, [name]  Evolocumab  (REPATHA  SURECLICK) 140 MG/ML SOAJ INJECT 1 DOSE INTO THE SKIN EVERY 14 DAYS 11/11/22   Hilty, Vinie BROCKS, MD  levothyroxine  (SYNTHROID ) 50 MCG tablet Take 50 mcg by mouth daily. 01/02/23   [provider]  linezolid  (ZYVOX ) 600 MG tablet Take 1 tablet (600 mg total) by mouth 2 (two) times daily for 14 days. 07/21/23 08/04/23  Goodrich, Callie E, [name]  magnesium  oxide (MAG-OX) 400 MG tablet Take 400 mg by mouth daily.    [provider]  pantoprazole  (PROTONIX ) 40 MG tablet Take 1 tablet (40 mg total) by mouth daily. 07/15/23   Goodrich, Callie E, [name]  spironolactone  (ALDACTONE ) 25 MG tablet Take 1 tablet (25 mg total) by mouth daily. 07/14/23   Goodrich, Callie E, [name]  telmisartan (MICARDIS) 40 MG tablet Take 40 mg by mouth daily.  09/11/17   [provider]  torsemide  (DEMADEX ) 20 MG tablet Take 1 tablet (20 mg total) by mouth daily. 07/14/23   Goodrich, Callie E, [name]  VITAMIN D  PO Take 1 tablet by mouth daily.    [provider]  zolpidem  (AMBIEN ) 5 MG tablet TAKE 1 TABLET BY MOUTH AT BEDTIME AS NEEDED FOR SLEEP 10/18/22   Cottle, Lorene KANDICE Raddle., MD    Scheduled Meds:  ALPRAZolam   0.5 mg Oral Once   apixaban   5 mg Oral BID    [START ON 07/24/2023] aspirin  EC  81 mg Oral Daily   [START ON 07/24/2023] levothyroxine   50 mcg Oral Daily   linezolid   600 mg Oral BID   [START ON 07/24/2023] pantoprazole   40 mg Oral Daily   sodium chloride  flush  3 mL Intravenous Q12H   Continuous Infusions:  PRN Meds: acetaminophen  **OR** acetaminophen , ALPRAZolam , polyethylene glycol  Allergies:  Allergies  Allergen Reactions   Codeine Other (See Comments)    headache   Crestor [Rosuvastatin] Other (See Comments)    Muscle aches   Other Nausea And Vomiting    Anesthesia-severe vomiting   Simvastatin Other (See Comments)    Muscle aches   Egg White (Egg Protein)     Other Reaction(s): Throat closes   Ezetimibe      Other Reaction(s): crippling muscle aches   Hydrocodone-Acetaminophen      Other Reaction(s): migraines   Peanut (Diagnostic)     Other Reaction(s): itchy   Shellfish Allergy Other (See Comments)    Throat swelling   Statins     Social History:   Social History   Socioeconomic History   Marital status: Divorced    Spouse name: Not on file   Number of children: Not on file   Years of education: Not on file   Highest education level: Not on file  Occupational History   Not on file  Tobacco Use   Smoking status: Former    Types: Cigarettes   Smokeless tobacco: Never  Vaping Use   Vaping status: Never Used  Substance and Sexual Activity   Alcohol use: No   Drug use: No   Sexual activity: Yes    Partners: Male    Birth control/protection: Post-menopausal    Comment: occ  Other Topics Concern   Not on file  Social History Narrative   Not on file   Social Drivers of Health   Financial Resource Strain: Not on file  Food Insecurity: No Food Insecurity (06/28/2023)   Hunger Vital Sign    Worried About Running Out of Food in the Last Year: Never true    Ran Out of Food in the Last Year: Never true  Transportation Needs: No Transportation Needs (06/28/2023)   PRAPARE - Scientist, research (physical sciences) (Medical): No    Lack of Transportation (Non-Medical): No  Physical Activity: Not on file  Stress: Not on file  Social Connections: Socially Integrated (06/28/2023)   Social Connection and Isolation Panel    Frequency of Communication with Friends and Family: Three times a week    Frequency of Social Gatherings with Friends and Family: Twice a week    Attends Religious Services: More than 4 times per year    Active Member of [employer] or Organizations: Yes    Attends Engineer, structural: More than 4 times per year    Marital Status: Married  Catering manager Violence: Not At Risk (06/28/2023)   Humiliation, Afraid, Rape, and Kick questionnaire    Fear of Current or Ex-Partner: No    Emotionally Abused: No    Physically Abused: No    Sexually Abused: No    Family History:    Family History  Problem Relation Age of Onset   Stroke Father    Depression Father    Stroke Mother    Hypertension Brother    Heart disease Brother        stint...PACER   Heart attack Brother    CAD Brother    Heart attack Maternal Uncle    Heart attack Maternal Grandmother    Heart attack Brother    Non-Hodgkin's lymphoma Brother    CAD Brother    Heart attack Maternal Uncle    Heart attack Maternal Uncle    Heart attack Maternal Uncle    Heart attack Maternal Uncle    Cholesteatoma Sister    Hyperlipidemia Sister  Other Brother        SUICIDE 05/2016     ROS:  Please see the history of present illness.   All other ROS reviewed and negative.     Physical Exam/Data: Vitals:   07/23/23 1330 07/23/23 1400 07/23/23 1415 07/23/23 1430  BP: 113/66 128/74 124/86 126/78  Pulse: (!) 50 76 78 78  Resp: 17 19 18 18   Temp:      TempSrc:      SpO2: 99% 98% 98% 99%  Weight:      Height:       No intake or output data in the 24 hours ending 07/23/23 1557    07/23/2023   12:27 PM 07/14/2023    5:05 AM 07/13/2023    3:54 AM  Last 3 Weights  Weight (lbs) 155 lb 13.8 oz 156 lb  1.4 oz 160 lb 15 oz  Weight (kg) 70.7 kg 70.8 kg 73 kg     Body mass index is 27.61 kg/m.  General:  Well nourished, well developed, in no acute distress HEENT: normal Neck: no JVD Vascular: No carotid bruits; Distal pulses 2+ bilaterally Cardiac:  normal S1, S2; RRR; no murmur  Lungs:  clear to auscultation bilaterally, no wheezing, rhonchi or rales. Bases minimally diminished R>L. Abd: soft, nontender, no hepatomegaly  Ext: no edema Musculoskeletal:  No deformities, BUE and BLE strength normal and equal Skin: warm and dry  Neuro:  CNs 2-12 intact, no focal abnormalities noted Psych:  Normal affect   EKG:  The EKG was personally reviewed and demonstrates:  sinus rhythm with P wave inversion. Non-specific T wave changes. Telemetry:  Telemetry was personally reviewed and demonstrates:  sinus rhythm with intermittent bradyarrhythmia. Also intermittent idioventricular rhythm (with faster rate than her sinus baseline).   Relevant CV Studies:  06/27/23 LHC/RHC    The left ventricular ejection fraction is greater than 65% by visual estimate.   Findings:   On NE 15   Ao = 123/68(93) LV = 97/19 RA = 18 RV = 35/17 PA =  35/21 (26) PCW = 19 Fick cardiac output/index = 2.3/1.3 Thermodilution CO/CI = 2.9/1.6 PVR = 0.8 WU Ao sat = 96% PA sat = 37%, 41% PAPi = 0.78   Assessment: 1. Left dominant coronary system with normal LM, LAD and LCx. Small non-dominant RCA not visualized (previously seen in 2017) 2. LVEF 65% 3. Evidence of severe RV failure with profound shock requiring placement of Impella RP support device  07/06/23 TTE  IMPRESSIONS     1. Technically difficult study.   2. Left ventricular ejection fraction, by estimation, is 60 to 65%. The  left ventricle has normal function. The left ventricle has no regional  wall motion abnormalities. Left ventricular diastolic parameters were  normal.   3. Right ventricular systolic function is normal. The right ventricular   size is mildly enlarged. A Temporary pacemaker lead is visualized.  Tricuspid regurgitation signal is inadequate for assessing PA pressure.   4. A small pericardial effusion is present.   5. The mitral valve is grossly normal. No evidence of mitral valve  regurgitation.   6. The aortic valve has an indeterminant number of cusps. Aortic valve  regurgitation is not visualized.   7. The inferior vena cava is normal in size with <50% respiratory  variability, suggesting right atrial pressure of 8 mmHg.   FINDINGS   Left Ventricle: Left ventricular ejection fraction, by estimation, is 60  to 65%. The left ventricle has normal function. The  left ventricle has no  regional wall motion abnormalities. Definity  contrast agent was given IV  to delineate the left ventricular   endocardial borders. The left ventricular internal cavity size was normal  in size. Suboptimal image quality limits for assessment of left  ventricular hypertrophy. Left ventricular diastolic parameters were  normal.   Right Ventricle: The right ventricular size is mildly enlarged. No  increase in right ventricular wall thickness. Right ventricular systolic  function is normal. Tricuspid regurgitation signal is inadequate for  assessing PA pressure.   Left Atrium: Left atrial size was normal in size.   Right Atrium: Right atrial size was normal in size.   Pericardium: A small pericardial effusion is present.   Mitral Valve: The mitral valve is grossly normal. No evidence of mitral  valve regurgitation.   Tricuspid Valve: The tricuspid valve is grossly normal. Tricuspid valve  regurgitation is not demonstrated.   Aortic Valve: The aortic valve has an indeterminant number of cusps.  Aortic valve regurgitation is not visualized.   Pulmonic Valve: The pulmonic valve was grossly normal. Pulmonic valve  regurgitation is not visualized.   Aorta: The aortic root and ascending aorta are structurally normal, with  no  evidence of dilitation.   Venous: The inferior vena cava is normal in size with less than 50%  respiratory variability, suggesting right atrial pressure of 8 mmHg.   IAS/Shunts: No atrial level shunt detected by color flow Doppler.   Additional Comments: A Temporary pacemaker lead is visualized in the right  ventricle.    Laboratory Data: High Sensitivity Troponin:   Recent Labs  Lab 06/27/23 1200 06/27/23 1629 06/28/23 0730 06/28/23 1017 07/23/23 1236  TROPONINIHS 3,412* 6,049* 4,251* 3,754* 24*     Chemistry Recent Labs  Lab 07/23/23 1236  NA 133*  K 4.0  CL 96*  CO2 27  GLUCOSE 106*  BUN 13  CREATININE 1.22*  CALCIUM  9.4  GFRNONAA 47*  ANIONGAP 10    No results for input(s): PROT, ALBUMIN, AST, ALT, ALKPHOS, BILITOT in the last 168 hours. Lipids No results for input(s): CHOL, TRIG, HDL, LABVLDL, LDLCALC, CHOLHDL in the last 168 hours.  Hematology Recent Labs  Lab 07/23/23 1236  WBC 5.8  RBC 3.77*  HGB 11.6*  HCT 34.7*  MCV 92.0  MCH 30.8  MCHC 33.4  RDW 13.0  PLT 195   Thyroid  No results for input(s): TSH, FREET4 in the last 168 hours.  BNPNo results for input(s): BNP, PROBNP in the last 168 hours.  DDimer No results for input(s): DDIMER in the last 168 hours.  Radiology/Studies:  DG Chest 2 View Result Date: 07/23/2023 CLINICAL DATA:  Chest pain, short of breath, nausea EXAM: CHEST - 2 VIEW COMPARISON:  07/08/2023 FINDINGS: Frontal and lateral views of the chest demonstrate chronic elevation of the right hemidiaphragm. Cardiac silhouette is unremarkable. No airspace disease, effusion, or pneumothorax. No acute bony abnormalities. IMPRESSION: 1. No acute intrathoracic process. Electronically Signed   By: Ozell Daring M.D.   On: 07/23/2023 13:05     Assessment and Plan:  Acute on chronic congestive heart failure, NYHA Class III-IV Recent NSTEMI with RV Infarct  Cardiogenic Shock Secondary to Profound RV  Failure As noted above in HPI, patient recently admitted in the setting of NSTEMI with cardiogenic shock secondary to RV infarction on 06/27/2023.  She was ultimately discharged on a regimen that included torsemide  20 mg daily spironolactone  25 mg daily.  Denies significant changes to weight, lower extremity or abdominal edema.  Does report 4 pillow orthopnea, increased from 2 pillows at time of discharge.  Denies significant changes to urine output.  This admission, patient's EKG is without acute ischemic changes.  Initial troponin minimally elevated to 24 (peak of 6049 at last admission). Patient's volume status admittedly a bit unclear.  Physical exam is not highly suggestive of hypervolemia and creatinine elevated to 1.22 from baseline of 0.5-0.6 could suggest intravascular volume depletion secondary to her diuretics.  Given that she appears overall comfortable on my exam, would favor further assessment with BNP and limited echocardiogram before resuming home diuretics vs volume depletion.  Ultimately will need adjustment of her guideline directed medical therapy prior to discharge to assist with symptom management. At this time it is not clear what is driving patient's orthopnea and exertional dypsnea.  At her last admission despite acute RV failure, repeat echocardiogram on 6/19 showed normal right ventricular systolic function as well as normal left ventricular systolic function with an ejection fraction of 60 to 65%.  I do have some concerns that the patient's ongoing to now accelerating symptoms could be secondary to more acute vs chronic occlusion of RCA territory given that this vessel was not visualized on left heart catheterization completed last admission and previously seen in 2017. It is notable however that inferior Q waves not noted on ECG. Although less likely, need to also consider that if patient has significant burden of accelerated idioventricular rhythm as seen on telemetry this admission,  this AV desynchrony (at high burden) could cause symptoms of shortness of breath.  Tachybradycardia syndrome Paroxysmal atrial fibrillation Sinus node dysfunction During patient's recent admission she was noted to be going in and out of atrial fibrillation early on during her admission.  She also later had significant bradycardia and junctional rhythms with rates as low as 30s to 40s resulting in placement of a temporary transvenous pacemaker.  Patient sinus node ultimately recovered prior to discharge and TVP removed.  Patient currently wearing a 2-week live ZIO monitor with no significant arrhythmia reported by monitoring service.  EKG strips from EMS as well as current inpatient telemetry show intermittent competing idioventricular rhythm intermixed with sinus rhythm. As above, need to consider the possibility that high burden of ventricular rhythm could contribute to patient's shortness of breath and dizziness/weakness.  Will need to monitor telemetry closely this admission as well as follow results of her ZIO. Continue to monitor for sinus node dysfunction.  Given above-noted right coronary artery abnormality on recent left heart catheterization, possible that there has been ischemia in the area of patient's sinus node contributing to its dysfunction. If patient's renal function allows, may need to consider a relook at her right coronary artery.  Hypertension Patient with appropriate blood pressure in the emergency department today.  Continue to follow closely this admission.  Patient should not receive beta-blockers given recent sinus node dysfunction.  Hyperlipidemia Continue Repatha  upon discharge  Recent shock liver During patient's recent admission with cardiogenic shock, noted to have elevated liver enzymes.  Recheck LFTs given recent GI upset.  Bacteremia During recent admission, patient found to have staph epi on blood cultures.  TEE negative for endocarditis.  Patient treated with 2  weeks of IV cefazolin , is now completing 2 weeks of oral Linezolid .  Longer duration of antibiotics secondary to ulcerated plaque in aorta (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). Continue antibiotics per primary team  Risk Assessment/Risk Scores:       New [place]  Heart Association (NYHA) Functional Class NYHA Class III-IV  CHA2DS2-VASc Score = 7   This indicates a 11.2% annual risk of stroke. The patient's score is based upon: CHF History: 1 HTN History: 1 Diabetes History: 0 Stroke History: 2 Vascular Disease History: 1 Age Score: 1 Gender Score: 1    For questions or updates, please contact [facility] Please consult [email or web address] for contact info under    Signed, [name], [name]  07/23/2023 3:57 PM   I have seen, examined the patient, and reviewed the above assessment and plan.    HPI: ATASHA COLEBANK is a 73 y.o. 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  He had been doing relatively well until last night when symptoms developed.  General: Well developed, in no acute distress.  Neck: No JVD.  Cardiac: Normal rate, regular rhythm.  Resp: Normal work of breathing.  Ext: No edema.  Neuro: No gross focal deficits.  Psych: Normal affect.   Assessment:  Patient with recent prolonged hospitalization for shock and RV failure requiring RP Impella.  Impella successfully weaned.  Hospital course was complicated by staph bacteremia.  She also was noted to have sinus node dysfunction and required temporary pacemaker.  I suspect this was secondary to sinus node ischemia/infarction.  Sinus node function did recover.  Temporary pacing wire was pulled.  Patient was discharged with live ZIO monitor.  She  completed 2 weeks of IV antibiotics and then was transition to linezolid  recently.  She had been doing relatively well up until last night when she developed orthopnea and shortness of breath.  She reports good urinary output with diuretics.  She has not had any peripheral edema similar to last hospitalization.  There have been no alerts from her Zio monitor.  At times she has had a competing idioventricular rhythm with retrograde conduction that is faster than her baseline sinus bradycardia.  EKGs have also demonstrated a an ectopic low atrial rhythm.  While in the room talking and examining patient she had appropriate sinus rhythm in the 70s.  There has been no evidence of heart block.  It is possible that if she remains in idioventricular rhythm for a long period of time this could cause symptoms.  I would suspect that if she was having long stretches of this that we would have been alerted by Zio.  We will watch her on telemetry to see if this is the case.    Plan:  - Repeat limited echo to reassess RV function and LVEF. - Continue monitoring heart rate on telemetry while admitted.  Zio monitor in place.  Still no obvious indication for permanent pacemaker unless we can prove symptoms associated with bradycardia.  Even if pacemaker is ultimately needed, would prefer to perform once recent bacteremia has been fully treated. - Ultimately, I feel like patient would benefit from a more definitive coronary evaluation at some point.  This has been somewhat limited by her renal function.  When she presented in shock with RV failure, RCA was unable to be engaged/visualized on left heart catheterization.  We are uncertain if RCA is truly totally occluded.  There were no obvious left-to-right collaterals on that catheterization to suggest a chronic component.  I suspect that she had acute occlusion of RCA at time of presentation during  last hospital stay.  Unclear if there was any recanalization.  RCA ischemia  could certainly be a cause of her heart failure symptoms as well as chest pain and bradycardia. - Will check BNP.  If elevated, will resume diuresis. Does not appear clinically overloaded but is reporting orthopnea.  Fonda Kitty, MD 07/23/2023 5:11 PM

## 2023-07-23 NOTE — H&P (Signed)
 History and Physical   VERNIA TEEM FMW:[id number] DOB: [birth year] DOA: 07/23/2023  PCP: Claudene Pellet, MD   Patient coming from: Home  Chief Complaint: Chest pain, shortness of breath, lightheadedness   HPI: Vicki [name] is a 73 y.o. female with medical history significant of hypertension, hyperlipidemia, CVA, tachybradycardia syndrome, CHF, depression, PTSD, alcohol use, status post lumbar fusion presenting with chest pain, shortness of breath, lightheadedness.  Patient was recently admitted 6/10-6/27.  Complex admission in the ICU.  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  No obstructive CAD ED was noted on cardiac cath but tiny/diminutive RCA was noted.  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  Course was complicated by tachybradycardia syndrome due to sinus node ischemia which did eventually recover and return to sinus rhythm.  Patient also had complication of bacteremia believed to be secondary to indwelling line and due to her aortic ulceration was treated with 2 weeks of outpatient IV cefazolin  and then to transition to 2 weeks of p.o. linezolid .  Patient just transitioned to p.o. linezolid  yesterday and today developed new chest pain/pressure with associated shortness of breath and lightheadedness.  Presented to the ED for further evaluation.  Denies fevers, chills, abdominal pain, constipation, diarrhea, nausea, vomiting.  ED Course: Vital signs in the ED notable for heart rate in the 50s-70s.  Lab workup included BMP with sodium 133, chloride 96, creatinine 1.22 from baseline 0.6, glucose 106.  LFTs pending.  CBC with hemoglobin stable 11.6.  Troponin 24 with repeat pending.  Lactic acid normal x 2.  Chest x-ray showed no acute abnormality.  Patient  received Zofran  in the ED.  Cardiology consulted and are seeing the patient.  Review of Systems: As per HPI otherwise all other systems reviewed and are negative.  Past Medical History:  Diagnosis Date   Alcohol intoxication (HCC) 05/2014    ED note    Anxiety    Arthritis    Boils    Chronic back pain    Community acquired pneumonia 01/05/2016   Depression    Fibromyalgia    GERD (gastroesophageal reflux disease)    Heart attack (HCC)    Hepatic cyst 01/06/2016   Hyperlipidemia    Hypertension    Insomnia    Leg cramps    Migraines    with aura   Mini stroke 05/2014   with paralysis for 1 hr   Osteopenia    PONV (postoperative nausea and vomiting)    severe   Postmenopausal bleeding 12/2015   PTSD (post-traumatic stress disorder)    S/P epidural steroid injection    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, passed out, 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 followed up with eye doctor no further issues   Spinal stenosis    Squamous cell carcinoma 12/25/2015   right leg and nose   Strep throat 01/05/2016   Stroke (HCC)    3 strokes, no residual    Past Surgical History:  Procedure Laterality Date   BREAST ENHANCEMENT SURGERY     CARDIAC CATHETERIZATION N/A 08/12/2015   Procedure: Left Heart Cath and Coronary Angiography;  Surgeon: Candyce GORMAN Reek, MD;  Location: [facility] INVASIVE CV LAB;  Service: Cardiovascular;  Laterality: N/A;   COLONOSCOPY     DILATATION & CURETTAGE/HYSTEROSCOPY  WITH MYOSURE N/A 02/22/2016   Procedure: DILATATION & CURETTAGE/HYSTEROSCOPY WITH MYOSURE;  Surgeon: Bobie FORBES Cathlyn JAYSON Nikki, MD;  Location: [facility] ORS;  Service: Gynecology;  Laterality: N/A;   FACIAL COSMETIC SURGERY     HAND SURGERY     nerve & tendon repair   LIPOSUCTION     LUMBAR EPIDURAL INJECTION  06/16/2015   RIGHT/LEFT HEART CATH AND CORONARY ANGIOGRAPHY N/A 06/27/2023   Procedure: RIGHT/LEFT HEART CATH AND CORONARY ANGIOGRAPHY;  Surgeon: Cherrie Toribio SAUNDERS, MD;  Location: [facility] INVASIVE CV LAB;   Service: Cardiovascular;  Laterality: N/A;   SKIN BIOPSY Right 02/22/2016   Procedure: BIOPSY SKIN;  Surgeon: Bobie FORBES Cathlyn JAYSON Nikki, MD;  Location: [facility] ORS;  Service: Gynecology;  Laterality: Right;   SQUAMOUS CELL CARCINOMA EXCISION     TEMPORARY PACEMAKER N/A 07/05/2023   Procedure: TEMPORARY PACEMAKER;  Surgeon: Zenaida Morene PARAS, MD;  Location[facility] INVASIVE CV LAB;  Service: Cardiovascular;  Laterality: N/A;   VENTRICULAR ASSIST DEVICE INSERTION N/A 06/27/2023   Procedure: VENTRICULAR ASSIST DEVICE INSERTION;  Surgeon: Cherrie Toribio SAUNDERS, MD;  Location: [facility] INVASIVE CV LAB;  Service: Cardiovascular;  Laterality: N/A;   WISDOM TOOTH EXTRACTION      Social History  reports that she has quit smoking. Her smoking use included cigarettes. She has never used smokeless tobacco. She reports that she does not drink alcohol and does not use drugs.  Allergies  Allergen Reactions   Codeine Other (See Comments)    headache   Crestor [Rosuvastatin] Other (See Comments)    Muscle aches   Other Nausea And Vomiting    Anesthesia-severe vomiting   Simvastatin Other (See Comments)    Muscle aches   Egg White (Egg Protein)     Other Reaction(s): Throat closes   Ezetimibe      Other Reaction(s): crippling muscle aches   Hydrocodone-Acetaminophen      Other Reaction(s): migraines   Peanut (Diagnostic)     Other Reaction(s): itchy   Shellfish Allergy Other (See Comments)    Throat swelling   Statins     Family History  Problem Relation Age of Onset   Stroke Father    Depression Father    Stroke Mother    Hypertension Brother    Heart disease Brother        stint...PACER   Heart attack Brother    CAD Brother    Heart attack Maternal Uncle    Heart attack Maternal Grandmother    Heart attack Brother    Non-Hodgkin's lymphoma Brother    CAD Brother    Heart attack Maternal Uncle    Heart attack Maternal Uncle    Heart attack Maternal Uncle    Heart attack Maternal Uncle    Cholesteatoma  Sister    Hyperlipidemia Sister    Other Brother        SUICIDE 05/2016  Reviewed on admission  Prior to Admission medications   Medication Sig Start Date End Date Taking? Authorizing Provider  acetaminophen  (TYLENOL ) 500 MG tablet Take 500 mg by mouth every 6 (six) hours as needed for mild pain (pain score 1-3).    [provider]  ALPRAZolam  (XANAX ) 0.5 MG tablet TAKE 1/2 TO 1 TABLET BY MOUTH TWICE DAILY AS NEEDED Patient taking differently: Take 0.5 mg by mouth daily as needed for anxiety. 04/25/22   Cottle, Lorene KANDICE Raddle., MD  apixaban  (ELIQUIS ) 5 MG TABS tablet Take 1 tablet (5 mg total) by mouth 2 (two) times daily. 07/14/23  Goodrich, Callie E, PA-C  aspirin  EC 81 MG tablet Take 1 tablet (81 mg total) by mouth daily. Swallow whole. 07/14/23   Goodrich, Callie E, PA-C  ceFAZolin  (ANCEF ) IVPB Inject 2 g into the vein every 8 (eight) hours for 9 days. Indication:  MSSE bacteremia First Dose: Yes Last Day of Therapy:  07/20/23 Labs - Once weekly:  CBC/D and BMP, Labs - Once weekly: ESR and CRP Method of administration: IV Push After completion of Cefazolin  on 7/3, the patient should start linezolid  starting 7/4 for 2 additional weeks Method of administration may be changed at the discretion of home infusion pharmacist based upon assessment of the patient and/or caregiver's ability to self-administer the medication ordered. 07/14/23 07/23/23  Goodrich, Callie E, PA-C  Evolocumab  (REPATHA  SURECLICK) 140 MG/ML SOAJ INJECT 1 DOSE INTO THE SKIN EVERY 14 DAYS 11/11/22   Hilty, Vinie BROCKS, MD  levothyroxine  (SYNTHROID ) 50 MCG tablet Take 50 mcg by mouth daily. 01/02/23   [provider]  linezolid  (ZYVOX ) 600 MG tablet Take 1 tablet (600 mg total) by mouth 2 (two) times daily for 14 days. 07/21/23 08/04/23  Goodrich, Callie E, PA-C  magnesium  oxide (MAG-OX) 400 MG tablet Take 400 mg by mouth daily.    [provider]  pantoprazole  (PROTONIX ) 40 MG tablet Take 1 tablet (40 mg total) by  mouth daily. 07/15/23   Goodrich, Callie E, PA-C  spironolactone  (ALDACTONE ) 25 MG tablet Take 1 tablet (25 mg total) by mouth daily. 07/14/23   Goodrich, Callie E, PA-C  telmisartan (MICARDIS) 40 MG tablet Take 40 mg by mouth daily.  09/11/17   [provider]  torsemide  (DEMADEX ) 20 MG tablet Take 1 tablet (20 mg total) by mouth daily. 07/14/23   Goodrich, Callie E, PA-C  VITAMIN D  PO Take 1 tablet by mouth daily.    [provider]  zolpidem  (AMBIEN ) 5 MG tablet TAKE 1 TABLET BY MOUTH AT BEDTIME AS NEEDED FOR SLEEP 10/18/22   Cottle, Lorene KANDICE Raddle., MD    Physical Exam: Vitals:   07/23/23 1330 07/23/23 1400 07/23/23 1415 07/23/23 1430  BP: 113/66 128/74 124/86 126/78  Pulse: (!) 50 76 78 78  Resp: 17 19 18 18   Temp:      TempSrc:      SpO2: 99% 98% 98% 99%  Weight:      Height:        Physical Exam Constitutional:      General: She is not in acute distress.    Appearance: Normal appearance.  HENT:     Head: Normocephalic and atraumatic.     Mouth/Throat:     Mouth: Mucous membranes are moist.     Pharynx: Oropharynx is clear.  Eyes:     Extraocular Movements: Extraocular movements intact.     Pupils: Pupils are equal, round, and reactive to light.  Cardiovascular:     Rate and Rhythm: Normal rate and regular rhythm.     Pulses: Normal pulses.     Heart sounds: Normal heart sounds.  Pulmonary:     Effort: Pulmonary effort is normal. No respiratory distress.     Breath sounds: Normal breath sounds.  Abdominal:     General: Bowel sounds are normal. There is no distension.     Palpations: Abdomen is soft.     Tenderness: There is no abdominal tenderness.  Musculoskeletal:        General: No swelling or deformity.  Skin:    General: Skin is warm and dry.  Neurological:     General: No focal deficit present.     Mental Status: Mental status is at baseline.    Labs on Admission: I have personally reviewed following labs and imaging studies  CBC: Recent  Labs  Lab 07/23/23 1236  WBC 5.8  HGB 11.6*  HCT 34.7*  MCV 92.0  PLT 195    Basic Metabolic Panel: Recent Labs  Lab 07/23/23 1236  NA 133*  K 4.0  CL 96*  CO2 27  GLUCOSE 106*  BUN 13  CREATININE 1.22*  CALCIUM  9.4    GFR: Estimated Creatinine Clearance: 39.3 mL/min (A) (by C-G formula based on SCr of 1.22 mg/dL (H)).  Liver Function Tests: No results for input(s): AST, ALT, ALKPHOS, BILITOT, PROT, ALBUMIN in the last 168 hours.  Urine analysis:    Component Value Date/Time   COLORURINE YELLOW 07/04/2023 0843   APPEARANCEUR CLEAR 07/04/2023 0843   LABSPEC 1.005 07/04/2023 0843   PHURINE 7.0 07/04/2023 0843   GLUCOSEU NEGATIVE 07/04/2023 0843   HGBUR SMALL (A) 07/04/2023 0843   BILIRUBINUR NEGATIVE 07/04/2023 0843   BILIRUBINUR n 05/09/2012 1334   KETONESUR NEGATIVE 07/04/2023 0843   PROTEINUR NEGATIVE 07/04/2023 0843   UROBILINOGEN negative 05/09/2012 1334   UROBILINOGEN 0.2 08/02/2010 1313   NITRITE NEGATIVE 07/04/2023 0843   LEUKOCYTESUR NEGATIVE 07/04/2023 0843    Radiological Exams on Admission: DG Chest 2 View Result Date: 07/23/2023 CLINICAL DATA:  Chest pain, short of breath, nausea EXAM: CHEST - 2 VIEW COMPARISON:  07/08/2023 FINDINGS: Frontal and lateral views of the chest demonstrate chronic elevation of the right hemidiaphragm. Cardiac silhouette is unremarkable. No airspace disease, effusion, or pneumothorax. No acute bony abnormalities. IMPRESSION: 1. No acute intrathoracic process. Electronically Signed   By: Ozell Daring M.D.   On: 07/23/2023 13:05   EKG: Independently reviewed.  Sinus rhythm at 55 bpm.  Nonspecific T wave changes.  Low voltage multiple leads.  Minimal baseline wander.  Assessment/Plan Principal Problem:   Chest pain Active Problems:   Hyperlipidemia   Alcohol abuse   History of CVA (cerebrovascular accident)   Essential hypertension   Depression   PTSD (post-traumatic stress disorder)   S/P lumbar fusion    Tachycardia-bradycardia syndrome (HCC)   AKI (acute kidney injury) (HCC)   Chest pain Recent new/acute right heart failure and tachybradycardia syndrome secondary to RV/sinus node infarct in the setting of diminutive RCA. > Last echo on 6/19 showed EF 60-65%, no diastolic function, normal RV function, small pericardial effusion, temporary pacemaker line still in place. > Complicated recent admission to the ICU as per HPI.  Now presenting with chest pain/pressure, shortness of breath, lightheadedness. > Initial workup reassuring with normal chest x-ray, troponin 24 with repeat pending.  Lactic acid normal x 2. > Creatinine is elevated to 1.22 from baseline 0.6 as below.  Could be that her symptoms are from dehydration/overdiuresis but will be monitored closely with cardiology following given recent complex cardiac admission. - Monitoring on progressive unit overnight - Appreciate cardiology recommendations and assistance - Hold spironolactone  and Lasix  for now - Hold telmisartan - Continue home Eliquis  - Strict I's and O's, daily weights - Check BNP - Echo  AKI > Creatinine elevated to 1.22 from baseline of 0.6 as above. > Possibly secondary to overdiuresis/dehydration. - Hold diuretics and telmisartan for now - Trend renal function and electrolytes  Recent bacteremia > Thought to be line associated, occurred during recent admission.  Completed 2 weeks of outpatient ID cefazolin .  Just started  p.o. linezolid . - Continue with p.o. linezolid   Hypertension - Hold telmisartan - Holding home torsemide  and spironolactone   Hyperlipidemia - On Repatha  outpatient  History of CVA - Continue home ASA - On Repatha  outpatient  Depression PTSD - Continue home Xanax   Hypothyroidism - Continue Synthroid   History of alcohol use - Noted  DVT prophylaxis: Eliquis  Code Status:   Full Family Communication:  None on admission Disposition Plan:   Patient is from:  Home  Anticipated DC  to:  Home  Anticipated DC date:  1 to 3 days  Anticipated DC barriers: None  Consults called:  Cardiology Admission status:  Observation, progressive  Severity of Illness: The appropriate patient status for this patient is OBSERVATION. Observation status is judged to be reasonable and necessary in order to provide the required intensity of service to ensure the patient's safety. The patient's presenting symptoms, physical exam findings, and initial radiographic and laboratory data in the context of their medical condition is felt to place them at decreased risk for further clinical deterioration. Furthermore, it is anticipated that the patient will be medically stable for discharge from the hospital within 2 midnights of admission.    Marsa KATHEE Scurry MD [facility]  How to contact the [facility] Attending or Consulting provider 7A - 7P or covering provider during after hours 7P -7A, for this patient?   Check the care team in [facility] and look for a) attending/consulting [facility] provider listed and b) the [facility] team listed Log into [email or web address] and use [facility] universal password to access. If you do not have the password, please contact the hospital operator. Locate the [facility] provider you are looking for under [facility] and page to a number that you can be directly reached. If you still have difficulty reaching the provider, please page the [facility] (Director on Call) for the Hospitalists listed on amion for assistance.  07/23/2023, 3:20 PM

## 2023-07-23 NOTE — Progress Notes (Signed)
 Echocardiogram 2D Echocardiogram has been performed.  Vicki [name] 07/23/2023, 4:46 PM

## 2023-07-23 NOTE — Progress Notes (Signed)
 Paged that patient was having VT. Assessed bedside, NSR 80s and conversant without any syncope/pre syncope hooked up to lifepack. Reviewed telemetry and NSR 80s with artifact, RR march through 740-840 ms throughout what is recorded at VT/VF. Will change out leads/tele box. Patient was resting in bed when it read as VT/VF.

## 2023-07-23 NOTE — Progress Notes (Signed)
 Pt arrived from ..ed..., A/ox .[name]4.pt denies any pain, MD aware,CCMD called. CHG bath given,no further needs at this time

## 2023-07-23 NOTE — ED Triage Notes (Addendum)
 Pt to er via ems, per ems pt is here for some chest pain, sob and nausea, states that she was admitted about a month ago for a heart attack and pacemaker that was placed and then removed, states she had a heart infection.  Pt states that she got up to go to the bathroom and she felt sob, states that she felt like she was going to pass out.

## 2023-07-23 NOTE — ED Provider Notes (Signed)
 [place] EMERGENCY DEPARTMENT AT [facility] Provider Note   CSN: [id number] Arrival date & time: 07/23/23  1219     Patient presents with: Chest Pain   Vicki [name] is a 73 y.o. female with a past medical history significant for hyperlipidemia, alcohol abuse, NSTEMI, CHF, tachybradycardia syndrome, recently treated for NSTEMI, after hospital stay she had a Staphylococcus bacteremia, was receiving PICC line antibiotics, just transition to oral antibiotics yesterday.  Endorses new shortness of breath, chest tightness.  Went into idioventricular rhythm for EMS.  Denies syncope, near syncope.  Reports not exerting herself when chest pain began. Endorses some palpitations with chest pain.    Chest Pain      Prior to Admission medications   Medication Sig Start Date End Date Taking? Authorizing Provider  acetaminophen  (TYLENOL ) 500 MG tablet Take 500 mg by mouth every 6 (six) hours as needed for mild pain (pain score 1-3).    [provider]  ALPRAZolam  (XANAX ) 0.5 MG tablet TAKE 1/2 TO 1 TABLET BY MOUTH TWICE DAILY AS NEEDED Patient taking differently: Take 0.5 mg by mouth daily as needed for anxiety. 04/25/22   Cottle, Lorene KANDICE Raddle., MD  apixaban  (ELIQUIS ) 5 MG TABS tablet Take 1 tablet (5 mg total) by mouth 2 (two) times daily. 07/14/23   Goodrich, Callie E, PA-C  aspirin  EC 81 MG tablet Take 1 tablet (81 mg total) by mouth daily. Swallow whole. 07/14/23   Goodrich, Callie E, PA-C  ceFAZolin  (ANCEF ) IVPB Inject 2 g into the vein every 8 (eight) hours for 9 days. Indication:  MSSE bacteremia First Dose: Yes Last Day of Therapy:  07/20/23 Labs - Once weekly:  CBC/D and BMP, Labs - Once weekly: ESR and CRP Method of administration: IV Push After completion of Cefazolin  on 7/3, the patient should start linezolid  starting 7/4 for 2 additional weeks Method of administration may be changed at the discretion of home infusion pharmacist based upon assessment of the patient  and/or caregiver's ability to self-administer the medication ordered. 07/14/23 07/23/23  Goodrich, Callie E, PA-C  Evolocumab  (REPATHA  SURECLICK) 140 MG/ML SOAJ INJECT 1 DOSE INTO THE SKIN EVERY 14 DAYS 11/11/22   Hilty, Vinie BROCKS, MD  levothyroxine  (SYNTHROID ) 50 MCG tablet Take 50 mcg by mouth daily. 01/02/23   [provider]  linezolid  (ZYVOX ) 600 MG tablet Take 1 tablet (600 mg total) by mouth 2 (two) times daily for 14 days. 07/21/23 08/04/23  Goodrich, Callie E, PA-C  magnesium  oxide (MAG-OX) 400 MG tablet Take 400 mg by mouth daily.    [provider]  pantoprazole  (PROTONIX ) 40 MG tablet Take 1 tablet (40 mg total) by mouth daily. 07/15/23   Goodrich, Callie E, PA-C  spironolactone  (ALDACTONE ) 25 MG tablet Take 1 tablet (25 mg total) by mouth daily. 07/14/23   Goodrich, Callie E, PA-C  telmisartan (MICARDIS) 40 MG tablet Take 40 mg by mouth daily.  09/11/17   [provider]  torsemide  (DEMADEX ) 20 MG tablet Take 1 tablet (20 mg total) by mouth daily. 07/14/23   Goodrich, Callie E, PA-C  VITAMIN D  PO Take 1 tablet by mouth daily.    [provider]  zolpidem  (AMBIEN ) 5 MG tablet TAKE 1 TABLET BY MOUTH AT BEDTIME AS NEEDED FOR SLEEP 10/18/22   Cottle, Lorene KANDICE Raddle., MD    Allergies: Codeine, Crestor [rosuvastatin], Other, Simvastatin, Egg white (egg protein), Ezetimibe , Hydrocodone-acetaminophen , Peanut (diagnostic), Shellfish allergy, and Statins    Review of Systems  Cardiovascular:  Positive for chest pain.  All other systems reviewed and are negative.   Updated Vital Signs BP 126/78   Pulse 78   Temp 97.8 F (36.6 C) (Oral)   Resp 18   Ht 5' 3 (1.6 m)   Wt 70.7 kg   LMP 07/18/2011   SpO2 99%   BMI 27.61 kg/m   Physical Exam Vitals and nursing note reviewed.  Constitutional:      General: She is not in acute distress.    Appearance: Normal appearance.  HENT:     Head: Normocephalic and atraumatic.  Eyes:     General:        Right eye: No  discharge.        Left eye: No discharge.  Cardiovascular:     Rate and Rhythm: Normal rate and regular rhythm.     Heart sounds: No murmur heard.    No friction rub. No gallop.  Pulmonary:     Effort: Pulmonary effort is normal.     Breath sounds: Normal breath sounds.     Comments: No wheezing, rhonchi, stridor, rales Abdominal:     General: Bowel sounds are normal.     Palpations: Abdomen is soft.  Skin:    General: Skin is warm and dry.     Capillary Refill: Capillary refill takes less than 2 seconds.  Neurological:     Mental Status: She is alert and oriented to person, place, and time.  Psychiatric:        Mood and Affect: Mood normal.        Behavior: Behavior normal.     (all labs ordered are listed, but only abnormal results are displayed) Labs Reviewed  BASIC METABOLIC PANEL WITH GFR - Abnormal; Notable for the following components:      Result Value   Sodium 133 (*)    Chloride 96 (*)    Glucose, Bld 106 (*)    Creatinine, Ser 1.22 (*)    GFR, Estimated 47 (*)    All other components within normal limits  CBC - Abnormal; Notable for the following components:   RBC 3.77 (*)    Hemoglobin 11.6 (*)    HCT 34.7 (*)    All other components within normal limits  TROPONIN I (HIGH SENSITIVITY) - Abnormal; Notable for the following components:   Troponin I (High Sensitivity) 24 (*)    All other components within normal limits  HEPATIC FUNCTION PANEL  I-STAT CG4 LACTIC ACID, ED  I-STAT CG4 LACTIC ACID, ED  TROPONIN I (HIGH SENSITIVITY)    EKG: EKG Interpretation Date/Time:  Sunday July 23 2023 12:32:31 EDT Ventricular Rate:  55 PR Interval:  125 QRS Duration:  90 QT Interval:  478 QTC Calculation: 458 R Axis:   61  Text Interpretation: Sinus or ectopic atrial rhythm Low voltage, extremity and precordial leads Nonspecific T abnrm, anterolateral leads Baseline wander in lead(s) II aVR V1 No significant change since last tracing Confirmed by Randol Simmonds [id number])  on 07/23/2023 12:37:29 PM  Radiology: [facility] Chest 2 View Result Date: 07/23/2023 CLINICAL DATA:  Chest pain, short of breath, nausea EXAM: CHEST - 2 VIEW COMPARISON:  07/08/2023 FINDINGS: Frontal and lateral views of the chest demonstrate chronic elevation of the right hemidiaphragm. Cardiac silhouette is unremarkable. No airspace disease, effusion, or pneumothorax. No acute bony abnormalities. IMPRESSION: 1. No acute intrathoracic process. Electronically Signed   By: Ozell Daring M.D.   On: 07/23/2023 13:05     Procedures   Medications  Ordered in the ED  ondansetron  (ZOFRAN ) injection 4 mg (4 mg Intravenous Given 07/23/23 1245)                                    Medical Decision Making  This patient is a 73 y.o. female  who presents to the ED for concern of chest pain, shob, nausea.   Differential diagnoses prior to evaluation: The emergent differential diagnosis includes, but is not limited to,  ACS, AAS, PE, Mallory-Weiss, Boerhaave's, Pneumonia, acute bronchitis, asthma or COPD exacerbation, anxiety, MSK pain or traumatic injury to the chest, acid reflux versus other . This is not an exhaustive differential.   Past Medical History / Co-morbidities / Social History: hyperlipidemia, alcohol abuse, NSTEMI, CHF, tachybradycardia syndrome, recently treated for NSTEMI, after hospital stay she had a Staphylococcus bacteremia, was receiving PICC line antibiotics  Additional history: Chart reviewed. Pertinent results include: significant   Physical Exam: Physical exam performed. The pertinent findings include: VSS in ED, afebrile, normotensive. Mild bradycardia, pulse around 50-55.  Lab Tests/Imaging studies: I personally interpreted labs/imaging and the pertinent results include:  CBC with mild anemia, hgb 11.6, otherwise unremarkable, BMP with mild hyponatremia, sodium 133, creatinine 1.22 around double from baseline of 0.6, suspect AKI from torsemide . Initial troponin 24. Delta pending at  this time. I agree with the radiologist interpretation.  Cardiac monitoring: EKG obtained and interpreted by myself and attending physician which shows: NSR, nonspecific T wave changes   Medications: I ordered medication including zofran  for nausea.  I have reviewed the patients home medicines and have made adjustments as needed.  Consults: Spoke with cardiology, who will plan to consult patient given her recent hospital stay, but recommends medicine admission. Spoke with hospitalist, Dr. Seena who agrees to admission for high risk chest pain as discussed above.    Disposition: After consideration of the diagnostic results and the patients response to treatment, I feel that patient would benefit from medical admission for chest pain in context of recent NSTEMI, staph bacteremia.   Final diagnoses:  Chest pain, unspecified type    ED Discharge Orders     None          Rosan Sherlean VEAR DEVONNA 07/23/23 1510    Randol Simmonds, MD 07/24/23 1642

## 2023-07-23 NOTE — Significant Event (Incomplete)
 Rapid Response Event Note   Reason for Call :  VT  Initial Focused Assessment:       Interventions/Plan of Care:  EKG   Event Summary:  MD Notified:  Call Time: 1807 Arrival Time: 8190 End Time:  Tonna Chiquita POUR, RN

## 2023-07-24 ENCOUNTER — Ambulatory Visit (HOSPITAL_COMMUNITY): Payer: Self-pay | Admitting: Adult Health

## 2023-07-24 DIAGNOSIS — R079 Chest pain, unspecified: Secondary | ICD-10-CM | POA: Diagnosis not present

## 2023-07-24 DIAGNOSIS — R0602 Shortness of breath: Secondary | ICD-10-CM | POA: Diagnosis not present

## 2023-07-24 LAB — COMPREHENSIVE METABOLIC PANEL WITH GFR
ALT: 12 U/L (ref 0–44)
AST: 28 U/L (ref 15–41)
Albumin: 3.1 g/dL — ABNORMAL LOW (ref 3.5–5.0)
Alkaline Phosphatase: 57 U/L (ref 38–126)
Anion gap: 10 (ref 5–15)
BUN: 10 mg/dL (ref 8–23)
CO2: 28 mmol/L (ref 22–32)
Calcium: 9 mg/dL (ref 8.9–10.3)
Chloride: 96 mmol/L — ABNORMAL LOW (ref 98–111)
Creatinine, Ser: 0.79 mg/dL (ref 0.44–1.00)
GFR, Estimated: >60 mL/min (ref >60)
Glucose, Bld: 102 mg/dL — ABNORMAL HIGH (ref 70–99)
Potassium: 3.9 mmol/L (ref 3.5–5.1)
Sodium: 134 mmol/L — ABNORMAL LOW (ref 135–145)
Total Bilirubin: 0.5 mg/dL (ref 0.0–1.2)
Total Protein: 6.3 g/dL — ABNORMAL LOW (ref 6.5–8.1)

## 2023-07-24 LAB — CBC
HCT: 33.6 % — ABNORMAL LOW (ref 36.0–46.0)
Hemoglobin: 11.2 g/dL — ABNORMAL LOW (ref 12.0–15.0)
MCH: 31.2 pg (ref 26.0–34.0)
MCHC: 33.3 g/dL (ref 30.0–36.0)
MCV: 93.6 fL (ref 80.0–100.0)
Platelets: 179 K/uL (ref 150–400)
RBC: 3.59 MIL/uL — ABNORMAL LOW (ref 3.87–5.11)
RDW: 12.9 % (ref 11.5–15.5)
WBC: 6.1 K/uL (ref 4.0–10.5)
nRBC: 0 % (ref 0.0–0.2)

## 2023-07-24 LAB — ECHO TEE

## 2023-07-24 MED ORDER — ZOLPIDEM TARTRATE 5 MG PO TABS: 5.0000 mg | ORAL_TABLET | Freq: Every evening | ORAL | Status: DC | PRN

## 2023-07-24 MED ORDER — SERTRALINE HCL 100 MG PO TABS: 100.0000 mg | ORAL_TABLET | Freq: Every day | ORAL | Status: DC

## 2023-07-24 NOTE — Progress Notes (Signed)
 Vicki [name]  FMW:[id number] DOB: 1950-04-19 DOA: 07/23/2023 PCP: Claudene Pellet, MD    Brief Narrative:  73 year old with a history of HTN, HLD, CVA, tachy/bradycardia syndrome, depression, PTSD, prior lumbar fusion, and alcohol abuse who required an admission to the ICU June 10-27. 2025 due to cardiogenic shock with acute right heart failure due to RV infarct in the setting of a diminutive RCA.  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  Her hospitalization was complicated by tacky/bradycardia syndrome due to sinus node ischemia which eventually recovered with return to NSR, as well as bacteremia due to an indwelling line which was treated with 2 weeks of outpatient cefazolin  > linezolid .  She returned to the ER 7/6 with chest pain/pressure with associated shortness of breath and lightheadedness.  Goals of Care:   Code Status: Full Code   DVT prophylaxis:  apixaban  (ELIQUIS ) tablet 5 mg   Interim Hx: Afebrile since admission.  Vital signs stable.  Telemetry reported an episode of VT last night but further assessment by EP suggests this was actually NSR with artifact.  Oxygen  saturation 99% on 2 L nasal cannula.  The patient is sitting up at the bedside at the time of visit and reports that she feels dramatically better at this time.  She denies shortness of breath or current chest pain.  No nausea or vomiting or abdominal pain.  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 TTE 7/6 noted EF 65-70% with reportedly normal RV systolic function and size -blood pressure preserved  Tachybradycardia syndrome Atrial fibrillation alternating with severe bradycardia -required temporary pacemaker during prior admission but permanent  pacemaker withheld in setting of bacteremia then with recovery of sinus node function -started on Eliquis  during prior admission -heart rate appears stable at present  Acute kidney injury Creatinine 1.22 at presentation with baseline of 0.6 - creatinine has rapidly normalized  Recent Staph epi bacteremia CVL associated -completed 2 weeks of IV cefazolin  & recently transitioned to oral linezolid  to complete an additional 2 weeks of therapy  HTN Holding home blood pressure medications with blood pressure presently stable  HLD On Repatha  as outpatient  History of CVA Continue usual ASA  Depression/PTSD/anxiety Continue usual Xanax  dose  Hypothyroidism Continue usual Synthroid  dose  History of alcohol abuse No evidence of withdrawal thus far   Family Communication: No family present at time of exam Disposition: Anticipate discharge home when cleared by cardiology   Objective: Blood pressure 131/76, pulse 82, temperature 98.7 F (37.1 C), temperature source Oral, resp. rate 17, height 5' 3 (1.6 m), weight 67 kg, last menstrual period 07/18/2011, SpO2 99%.  Intake/Output Summary (Last 24 hours) at 07/24/2023 0855 Last data filed at 07/24/2023 0331 Gross per 24 hour  Intake 360 ml  Output 375 ml  Net -15 ml   Filed Weights   07/23/23 1227 07/24/23 0325  Weight: 70.7 kg 67 kg    Examination: General: No acute respiratory distress Lungs: Clear to auscultation bilaterally without wheezes or crackles Cardiovascular: Regular rate and rhythm without murmur gallop or rub normal S1 and S2 Abdomen: Nontender, nondistended, soft, bowel sounds positive, no rebound, no ascites, no appreciable mass Extremities: No significant cyanosis, clubbing, or edema bilateral lower extremities  CBC: Recent Labs  Lab 07/23/23 1236 07/24/23  0500  WBC 5.8 6.1  HGB 11.6* 11.2*  HCT 34.7* 33.6*  MCV 92.0 93.6  PLT 195 179   Basic Metabolic Panel: Recent Labs  Lab 07/23/23 1236  07/23/23 1516 07/24/23 0500  NA 133*  --  134*  K 4.0  --  3.9  CL 96*  --  96*  CO2 27  --  28  GLUCOSE 106*  --  102*  BUN 13  --  10  CREATININE 1.22*  --  0.79  CALCIUM  9.4  --  9.0  MG  --  2.0  --    GFR: Estimated Creatinine Clearance: 58.4 mL/min (by C-G formula based on SCr of 0.79 mg/dL).   Scheduled Meds:  apixaban   5 mg Oral BID   aspirin  EC  81 mg Oral Daily   levothyroxine   50 mcg Oral Q0600   linezolid   600 mg Oral BID   pantoprazole   40 mg Oral Daily   sodium chloride  flush  3 mL Intravenous Q12H     LOS: 0 days   Reyes IVAR Moores, MD [facility] Office  [phone] Pager - Text Page per Tracey  If 7PM-7AM, please contact night-coverage per Amion 07/24/2023, 8:55 AM

## 2023-07-24 NOTE — Progress Notes (Signed)
   Rounding Note    Patient Name: Vicki [name] Date of Encounter: 07/24/2023  [facility] Cardiologist: Vinie JAYSON Maxcy, MD   Subjective   Concern overnight for VT. Telemetry reviewed by overnight fellow and myself this AM. Consistent with artifact. She feels the best I've felt in four weeks. Unclear what made her feel better.   Inpatient Medications    Scheduled Meds:  apixaban   5 mg Oral BID   aspirin  EC  81 mg Oral Daily   levothyroxine   50 mcg Oral Q0600   linezolid   600 mg Oral BID   pantoprazole   40 mg Oral Daily   sodium chloride  flush  3 mL Intravenous Q12H   Continuous Infusions:  PRN Meds: acetaminophen  **OR** acetaminophen , ALPRAZolam , polyethylene glycol   Vital Signs    Vitals:   07/23/23 1930 07/23/23 2324 07/24/23 0325 07/24/23 0801  BP: 104/75 115/60 121/61 131/76  Pulse: 72 81 81 82  Resp: 19 20 19 17   Temp: (!) 97.5 F (36.4 C) 98.6 F (37 C) 98.5 F (36.9 C) 98.7 F (37.1 C)  TempSrc: Oral Oral Oral Oral  SpO2: 99% 99% 98% 99%  Weight:   67 kg   Height:        Intake/Output Summary (Last 24 hours) at 07/24/2023 0855 Last data filed at 07/24/2023 0331 Gross per 24 hour  Intake 360 ml  Output 375 ml  Net -15 ml      07/24/2023    3:25 AM 07/23/2023   12:27 PM 07/14/2023    5:05 AM  Last 3 Weights  Weight (lbs) 147 lb 11.3 oz 155 lb 13.8 oz 156 lb 1.4 oz  Weight (kg) 67 kg 70.7 kg 70.8 kg      Telemetry    SR, artifact - Personally Reviewed  Physical Exam   GEN: No acute distress.   Neck: No JVD Cardiac: RRR, no murmurs, rubs, or gallops.  Respiratory: Clear to auscultation bilaterally. GI: Soft, nontender, non-distended  MS: No edema; No deformity. Neuro:  Nonfocal  Psych: Normal affect   New pertinent results (labs, ECG, imaging, cardiac studies)     Assessment & Plan    Orthopnea/shortness of breath Chest discomfort Lightheadedness -acute onset overnight 7/5-7/6 -notes stable weights, no edema at  home -limited echo 7/6 showed LVEF 65-70%, RV function normal, RAP 3 -CXR unremarkable -BNP mildly elevated at 137, lower than prior (543) -admission weight 70.7 kg, weight today 67 kg (question accuracy as charted net even) -recent complex hospitalization with profound shock/RV failure requiring RP impella, pressors/inotropes, and temporary pacemaker for sinus node dysfunction, complicated by staph bacteremia -today feeling much better. No acute changes to regimen. Encouraged her to ambulate, if she feels just as well in the AM could be discharged.  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 RCA not well visualized, known to be small/nondominant by prior cath   Signed, Shelda Bruckner, MD  07/24/2023, 8:55 AM

## 2023-07-25 ENCOUNTER — Ambulatory Visit: Admitting: Cardiology

## 2023-07-25 DIAGNOSIS — R0602 Shortness of breath: Secondary | ICD-10-CM

## 2023-07-25 DIAGNOSIS — R079 Chest pain, unspecified: Secondary | ICD-10-CM | POA: Diagnosis not present

## 2023-07-25 HISTORY — DX: Shortness of breath: R06.02

## 2023-07-25 LAB — CBC
HCT: 33.3 % — ABNORMAL LOW (ref 36.0–46.0)
Hemoglobin: 11.3 g/dL — ABNORMAL LOW (ref 12.0–15.0)
MCH: 31.4 pg (ref 26.0–34.0)
MCHC: 33.9 g/dL (ref 30.0–36.0)
MCV: 92.5 fL (ref 80.0–100.0)
Platelets: 186 K/uL (ref 150–400)
RBC: 3.6 MIL/uL — ABNORMAL LOW (ref 3.87–5.11)
RDW: 12.7 % (ref 11.5–15.5)
WBC: 6 K/uL (ref 4.0–10.5)
nRBC: 0 % (ref 0.0–0.2)

## 2023-07-25 LAB — COMPREHENSIVE METABOLIC PANEL WITH GFR
ALT: 13 U/L (ref 0–44)
AST: 28 U/L (ref 15–41)
Albumin: 3.2 g/dL — ABNORMAL LOW (ref 3.5–5.0)
Alkaline Phosphatase: 54 U/L (ref 38–126)
Anion gap: 9 (ref 5–15)
BUN: 9 mg/dL (ref 8–23)
CO2: 27 mmol/L (ref 22–32)
Calcium: 9.2 mg/dL (ref 8.9–10.3)
Chloride: 98 mmol/L (ref 98–111)
Creatinine, Ser: 0.77 mg/dL (ref 0.44–1.00)
GFR, Estimated: >60 mL/min (ref >60)
Glucose, Bld: 102 mg/dL — ABNORMAL HIGH (ref 70–99)
Potassium: 4 mmol/L (ref 3.5–5.1)
Sodium: 134 mmol/L — ABNORMAL LOW (ref 135–145)
Total Bilirubin: 0.7 mg/dL (ref 0.0–1.2)
Total Protein: 6.2 g/dL — ABNORMAL LOW (ref 6.5–8.1)

## 2023-07-25 LAB — MAGNESIUM: Magnesium: 2 mg/dL (ref 1.7–2.4)

## 2023-07-25 MED ORDER — PANTOPRAZOLE SODIUM 40 MG PO TBEC
40.0000 mg | DELAYED_RELEASE_TABLET | Freq: Every day | ORAL | 2 refills | Status: AC
Start: 2023-07-25 — End: ?

## 2023-07-25 MED ORDER — SERTRALINE HCL 100 MG PO TABS: 100.0000 mg | ORAL_TABLET | Freq: Every day | ORAL | Status: DC

## 2023-07-25 NOTE — Progress Notes (Signed)
 Mobility Specialist Progress Note:    07/25/23 1057  Mobility  Activity Ambulated independently to bathroom;Ambulated independently in hallway;Ambulated independently in room  Level of Assistance Independent  Assistive Device None  Distance Ambulated (ft) 350 ft  Activity Response Tolerated well  Mobility Referral Yes  Mobility visit 1 Mobility  Mobility Specialist Start Time (ACUTE ONLY) 1024  Mobility Specialist Stop Time (ACUTE ONLY) 1030  Mobility Specialist Time Calculation (min) (ACUTE ONLY) 6 min   Pt received in bed, agreeable to mobility session. Ambulated in hallway, no AD required. Tolerated well, asx throughout. Returned pt room, husband at bedside, eager for d/c.   Ace Bergfeld Mobility Specialist Please contact via SecureChat or  Rehab office at [phone]

## 2023-07-25 NOTE — Discharge Summary (Signed)
 DISCHARGE SUMMARY  Vicki [name]  MR#: [id number]  DOB:[birth year]  Date of Admission: 07/23/2023 Date of Discharge: 07/25/2023  Attending Physician:Vicki [name] Vicki [name], Vicki [name]  Patient's ERE:Vicki [name], Vicki [name], Vicki [name]  Disposition: Discharge home  Follow-up Appts:  Follow-up Information     Vicki [name], Vicki [name] Follow up in 1 week(s).   Specialty: Family Medicine Contact information: [phone] 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 2025 due to cardiogenic shock with acute right heart failure due to RV infarct in the setting of a diminutive RCA. 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 Her hospitalization was complicated by tacky/bradycardia syndrome due to sinus node ischemia which eventually recovered with return to NSR, as well as bacteremia due to an indwelling line which was treated with 2 weeks of outpatient cefazolin  > linezolid . She returned to the ER 7/6 with chest pain/pressure with associated shortness of breath and lightheadedness.   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 TTE 7/6 noted EF 65-70% with reportedly normal RV systolic function and size - blood pressure preserved   Tachybradycardia syndrome - paroxysmal atrial fibrillation Atrial fibrillation alternating with severe bradycardia -required temporary pacemaker during prior admission but permanent pacemaker withheld in setting of bacteremia then with recovery of sinus node function -started on Eliquis  during prior admission -heart rate appears stable at present   Acute kidney injury Creatinine 1.22 at presentation with baseline of 0.6 - creatinine has rapidly normalized   Recent Staph epi bacteremia CVL associated - completed 2 weeks of IV cefazolin  & recently transitioned to oral linezolid  to complete an additional 2 weeks of therapy -counseled to hold Zoloft  while on linezolid  due to potential to lead to serotonin syndrome with resumption of Zoloft  the day after she completes her linezolid  therapy   HTN Holding home blood pressure medications with blood pressure presently stable   HLD On Repatha  as outpatient   History of thrombotic CVA Continue usual ASA   Depression/PTSD/anxiety Continue usual Xanax  dose   Hypothyroidism Continue usual Synthroid  dose   History of alcohol abuse No evidence of withdrawal thus far  Allergies as of 07/25/2023       Reactions   Codeine Other (See Comments)   headache   Crestor [rosuvastatin] Other (See Comments)   Muscle aches   Other Nausea And Vomiting   Anesthesia-severe vomiting   Simvastatin Other (  See Comments)   Muscle aches   Egg White (egg Protein)    Other Reaction(s): Throat closes   Ezetimibe     Other Reaction(s): crippling  muscle aches   Hydrocodone-acetaminophen     Other Reaction(s): migraines   Peanut (diagnostic)    Other Reaction(s): itchy   Shellfish Allergy Other (See Comments)   Throat swelling   Statins         Medication List     TAKE these medications    acetaminophen  500 MG tablet Commonly known as: TYLENOL  Take 500 mg by mouth every 6 (six) hours as needed for mild pain (pain score 1-3).   ALPRAZolam  0.5 MG tablet Commonly known as: XANAX  TAKE 1/2 TO 1 TABLET BY MOUTH TWICE DAILY AS NEEDED What changed:  how much to take how to take this when to take this reasons to take this additional instructions   aspirin  EC 81 MG tablet Take 1 tablet (81 mg total) by mouth daily. Swallow whole.   Eliquis  5 MG Tabs tablet Generic drug: apixaban  Take 1 tablet (5 mg total) by mouth 2 (two) times daily.   levothyroxine  50 MCG tablet Commonly known as: SYNTHROID  Take 50 mcg by mouth daily.   linezolid  600 MG tablet Commonly known as: ZYVOX  Take 1 tablet (600 mg total) by mouth 2 (two) times daily for 14 days.   magnesium  oxide 400 MG tablet Commonly known as: MAG-OX Take 400 mg by mouth daily.   pantoprazole  40 MG tablet Commonly known as: PROTONIX  Take 1 tablet (40 mg total) by mouth daily.   Repatha  SureClick 140 MG/ML Soaj Generic drug: Evolocumab  INJECT 1 DOSE INTO THE SKIN EVERY 14 DAYS   sertraline  100 MG tablet Commonly known as: ZOLOFT  Take 1 tablet (100 mg total) by mouth daily. Start taking on: August 05, 2023 What changed: These instructions start on August 05, 2023. If you are unsure what to do until then, ask your doctor or other care provider.   spironolactone  25 MG tablet Commonly known as: ALDACTONE  Take 1 tablet (25 mg total) by mouth daily.   torsemide  20 MG tablet Commonly known as: DEMADEX  Take 1 tablet (20 mg total) by mouth daily.   zolpidem  5 MG tablet Commonly known as: AMBIEN  TAKE 1 TABLET BY MOUTH AT BEDTIME AS NEEDED FOR SLEEP        Day  of Discharge BP (!) 153/82 (BP Location: Right Arm)   Pulse 85   Temp 98.6 F (37 C) (Oral)   Resp 18   Ht 5' 3 (1.6 m)   Wt 68.6 kg   LMP 07/18/2011   SpO2 98%   BMI 26.79 kg/m   Physical Exam: General: No acute respiratory distress Lungs: Clear to auscultation bilaterally without wheezes or crackles Cardiovascular: Regular rate and rhythm without murmur gallop or rub normal S1 and S2 Abdomen: Nontender, nondistended, soft, bowel sounds positive, no rebound, no ascites, no appreciable mass Extremities: No significant cyanosis, clubbing, or edema bilateral lower extremities  Basic Metabolic Panel: Recent Labs  Lab 07/23/23 1236 07/23/23 1516 07/24/23 0500 07/25/23 0347  NA 133*  --  134* 134*  K 4.0  --  3.9 4.0  CL 96*  --  96* 98  CO2 27  --  28 27  GLUCOSE 106*  --  102* 102*  BUN 13  --  10 9  CREATININE 1.22*  --  0.79 0.77  CALCIUM  9.4  --  9.0 9.2  MG  --  2.0  --  2.0    CBC: Recent Labs  Lab 07/23/23 1236 07/24/23 0500 07/25/23 0347  WBC 5.8 6.1 6.0  HGB 11.6* 11.2* 11.3*  HCT 34.7* 33.6* 33.3*  MCV 92.0 93.6 92.5  PLT 195 179 186    Time spent in discharge (includes decision making & examination of pt): 35 minutes  07/25/2023, 11:34 AM   Reyes IVAR [name], Vicki [name] [facility] Office  [phone]

## 2023-07-25 NOTE — Plan of Care (Signed)
  Problem: Education: Goal: Knowledge of General Education information will improve Description: Including pain rating scale, medication(s)/side effects and non-pharmacologic comfort measures Outcome: Progressing   Problem: Clinical Measurements: Goal: Ability to maintain clinical measurements within normal limits will improve Outcome: Progressing Goal: Respiratory complications will improve Outcome: Progressing   Problem: Activity: Goal: Risk for activity intolerance will decrease Outcome: Progressing   Problem: Pain Managment: Goal: General experience of comfort will improve and/or be controlled Outcome: Progressing

## 2023-07-25 NOTE — Progress Notes (Signed)
   Rounding Note    Patient Name: Vicki [name] Date of Encounter: 07/25/2023  [facility] Cardiologist: Vinie JAYSON Maxcy, MD   Subjective   No significant events overnight. Feeling much better, looking forward to going home. Breathing is good, no chest pain  Inpatient Medications    Scheduled Meds:  apixaban   5 mg Oral BID   aspirin  EC  81 mg Oral Daily   levothyroxine   50 mcg Oral Q0600   linezolid   600 mg Oral BID   pantoprazole   40 mg Oral Daily   sodium chloride  flush  3 mL Intravenous Q12H   Continuous Infusions:  PRN Meds: acetaminophen  **OR** [DISCONTINUED] acetaminophen , ALPRAZolam , polyethylene glycol, zolpidem    Vital Signs    Vitals:   07/25/23 0442 07/25/23 0500 07/25/23 0517 07/25/23 0916  BP: (!) 148/92   (!) 152/94  Pulse: 83   82  Resp: 17 17  16   Temp: 97.7 F (36.5 C)     TempSrc: Oral     SpO2: 97%   97%  Weight:   68.6 kg   Height:       No intake or output data in the 24 hours ending 07/25/23 1023     07/25/2023    5:17 AM 07/24/2023    3:25 AM 07/23/2023   12:27 PM  Last 3 Weights  Weight (lbs) 151 lb 3.8 oz 147 lb 11.3 oz 155 lb 13.8 oz  Weight (kg) 68.6 kg 67 kg 70.7 kg      Telemetry    SR - Personally Reviewed  Physical Exam   GEN: No acute distress.   Neck: No JVD Cardiac: RRR, no murmurs, rubs, or gallops.  Respiratory: Clear to auscultation bilaterally. GI: Soft, nontender, non-distended  MS: No edema; No deformity. Neuro:  Nonfocal  Psych: Normal affect   New pertinent results (labs, ECG, imaging, cardiac studies)     Assessment & Plan    Orthopnea/shortness of breath Chest discomfort Lightheadedness -acute onset overnight 7/5-7/6 -notes stable weights, no edema at home -limited echo 7/6 showed LVEF 65-70%, RV function normal, RAP 3 -CXR unremarkable -BNP mildly elevated at 137, lower than prior (543) -recent complex hospitalization with profound shock/RV failure requiring RP impella,  pressors/inotropes, and temporary pacemaker for sinus node dysfunction, complicated by staph bacteremia -has turned around symptomatically without significant change to meds  Paroxysmal atrial fibrillation History of sinus node dysfunction during acute illness requiring temporary transvenous pacing -artifact on telemetry, no high risk arrhythmias -no alerts on outpatient Zio -continue apixaban   Staph epi bacteremia -TEE negative for endocarditis -completed 2 weeks IV cefazolin , completing 2 weeks linezolid   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 RCA not well visualized, known to be small/nondominant by prior cath  Feeling much better, ok for discharge today. Keep scheduled outpatient cardiology follow up.   Signed, Shelda Bruckner, MD  07/25/2023, 10:23 AM

## 2023-07-25 NOTE — Progress Notes (Signed)
 Discharge education provided to the patient and her spouse, PIV removed, CCMD notified, all the belongings taken by patient and family, no any concerns.

## 2023-07-28 ENCOUNTER — Encounter (HOSPITAL_COMMUNITY): Payer: Self-pay

## 2023-08-03 ENCOUNTER — Inpatient Hospital Stay: Admitting: Infectious Diseases

## 2023-08-04 DIAGNOSIS — R001 Bradycardia, unspecified: Secondary | ICD-10-CM | POA: Diagnosis not present

## 2023-08-11 ENCOUNTER — Ambulatory Visit: Payer: Self-pay | Admitting: Pulmonary Disease

## 2023-08-14 ENCOUNTER — Inpatient Hospital Stay: Admitting: Infectious Diseases

## 2023-08-15 ENCOUNTER — Ambulatory Visit: Admitting: Student

## 2023-08-15 DIAGNOSIS — R7881 Bacteremia: Secondary | ICD-10-CM | POA: Diagnosis not present

## 2023-08-15 DIAGNOSIS — I50811 Acute right heart failure: Secondary | ICD-10-CM | POA: Diagnosis not present

## 2023-08-15 DIAGNOSIS — I10 Essential (primary) hypertension: Secondary | ICD-10-CM | POA: Diagnosis not present

## 2023-08-15 DIAGNOSIS — B9561 Methicillin susceptible Staphylococcus aureus infection as the cause of diseases classified elsewhere: Secondary | ICD-10-CM | POA: Diagnosis not present

## 2023-08-15 DIAGNOSIS — I719 Aortic aneurysm of unspecified site, without rupture: Secondary | ICD-10-CM | POA: Diagnosis not present

## 2023-08-15 DIAGNOSIS — R570 Cardiogenic shock: Secondary | ICD-10-CM | POA: Diagnosis not present

## 2023-08-15 DIAGNOSIS — E785 Hyperlipidemia, unspecified: Secondary | ICD-10-CM | POA: Diagnosis not present

## 2023-08-15 DIAGNOSIS — I5031 Acute diastolic (congestive) heart failure: Secondary | ICD-10-CM | POA: Diagnosis not present

## 2023-08-15 DIAGNOSIS — E039 Hypothyroidism, unspecified: Secondary | ICD-10-CM | POA: Diagnosis not present

## 2023-08-15 DIAGNOSIS — I252 Old myocardial infarction: Secondary | ICD-10-CM | POA: Diagnosis not present

## 2023-08-15 DIAGNOSIS — F419 Anxiety disorder, unspecified: Secondary | ICD-10-CM | POA: Diagnosis not present

## 2023-08-15 DIAGNOSIS — D649 Anemia, unspecified: Secondary | ICD-10-CM | POA: Diagnosis not present

## 2023-08-15 LAB — LAB REPORT - SCANNED: EGFR: 72

## 2023-08-17 NOTE — Progress Notes (Deleted)
 Cardiology Office Note:  .   Date:  08/17/2023  ID:  [name], DOB [birth year], MRN [id number] PCP: Claudene Pellet, MD  [facility] Providers Cardiologist:  Vinie JAYSON Maxcy, MD {  History of Present Illness: .   Vicki [name] is a 73 y.o. female w/PMHx of  HTN, HLD, TIAs  Admitted 06/27/23 for c/o, N/V, malaise some days ahead, arrived c/o CP found with NSTEMI and RV failure Emergent R/LHC with no significant CAD or culprit lesion but unable to angiographically identify RCA (seen as a small RCA in cath 2017) and severe RV failure noted.  -- Impella placed.  CTA without PE but with distal descending thoracic aorta with a small ulcerated plaque measuring about 8mm in maximum thickness by 14 mm in maximum diameter.  (seen by VVS 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)  -- Impella removed 6/15. Continued to require pressor support.  Shock liver improved, AKI improved.  -- 6/17 had fever > 102 with leukocytosis. BCx x4 + with Staph Epi. -- TTE without signs of endocarditis. Preserved LVEF, RV improved -- 6/20 : EP brought on board with bradycardia (SND)  requiring dobutamine  and ultimately temp wire -- 6/26 EP s/o with recovery of conduction, suspect 2/2 acute/critical illness Developed PAFib during this stay Discharged 6/27  Admitted 07/23/23, progressive SOB, some CP, + orthopnea In review, no obvious volume OL, improved symptomatically without significant adjustment in meds Dr. Kennyth in a consult note discussed: Wore a monitor with no alerts from her Zio monitor. At times she has had a competing idioventricular rhythm with retrograde conduction that is faster than her baseline sinus bradycardia. EKGs have also demonstrated a an ectopic low atrial rhythm.  Discharged 07/25/23   Today's visit is scheduled as a post hospital f/u ROS:   Sees cards team 8/20 *** eliquis , dose, bleeding, labs *** tachy? Nola? *** volume   Arrhythmia/AAD hx AFib during lengthy/complicated CHF admission June  2025 Transient brady as well with temp wire + BC this same admission staph epi  Studies Reviewed: [name]    EKG done today and reviewed by myself:  ***   08/04/23: monitor Monitor showed sinus rhythm with 48 episodes of PSVT lasting up to 51 seconds. PACs with a burden of 2.2% was reported.   07/23/23: TTE  1. Left ventricular ejection fraction, by estimation, is 65 to 70%. The  left ventricle has normal function. The left ventricle has no regional  wall motion abnormalities. Left ventricular diastolic parameters were  normal.   2. Right ventricular systolic function is normal. The right ventricular  size is normal. Tricuspid regurgitation signal is inadequate for assessing  PA pressure.   3. The mitral valve is normal in structure. Trivial mitral valve  regurgitation. No evidence of mitral stenosis.   4. The aortic valve was not well visualized. Aortic valve regurgitation  is not visualized. No aortic stenosis is present.   5. The inferior vena cava is normal in size with greater than 50%  respiratory variability, suggesting right atrial pressure of 3 mmHg.   07/10/23: TEE 1. Left ventricular ejection fraction, by estimation, is 60 to 65%. The  left ventricle has normal function.   2. Right ventricular systolic function is low normal. The right  ventricular size is normal.   3. No left atrial/left atrial appendage thrombus was detected.   4. The mitral valve is normal in structure. Trivial mitral valve  regurgitation. No evidence of mitral stenosis.   5. The aortic valve is tricuspid.  Aortic valve regurgitation is not  visualized. No aortic stenosis is present.    6/1/0/25: TTE 1. Left ventricular ejection fraction, by estimation, is 60 to 65%. The  left ventricle has normal function. Left ventricular endocardial border  not optimally defined to evaluate regional wall motion. Left ventricular  diastolic function could not be  evaluated.   2. 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 Right ventricular systolic function is mildly reduced. The  right ventricular size is normal.   3. The mitral valve is grossly normal. No evidence of mitral valve  regurgitation.   4. The aortic valve was not well visualized. Aortic valve regurgitation  is not visualized.   5. The inferior vena cava is dilated in size with <50% respiratory  variability, suggesting right atrial pressure of 15 mmHg.   6. This is a limited study. This study does not evaluated all parameters  for restrictive cardiomyopathy. Consider repeat study in the future if  clinically indicated; at that time TDI would be usefuly.   Comparison(s): No prior Echocardiogram.   06/27/23: R/LHC Assessment: 1. Left dominant coronary system with normal LM, LAD and LCx. Small non-dominant RCA not visualized (previously seen in 2017) 2. LVEF 65% 3. Evidence of severe RV failure with profound shock requiring placement of Impella RP support device   Risk Assessment/Calculations:    Physical Exam:   VS:  LMP 07/18/2011    Wt Readings from Last 3 Encounters:  07/25/23 151 lb 3.8 oz (68.6 kg)  07/14/23 156 lb 1.4 oz (70.8 kg)  01/24/23 174 lb 12.8 oz (79.3 kg)    GEN: Well nourished, well developed in no acute distress NECK: No JVD; No carotid bruits CARDIAC: ***RRR, no murmurs, rubs, gallops RESPIRATORY:  *** CTA b/l without rales, wheezing or rhonchi  ABDOMEN: Soft, non-tender, non-distended EXTREMITIES: ***  No edema; No deformity   ASSESSMENT AND PLAN: .    Transient bradycardia/SND  during critical illness that resolved, June 2025 Also during this admission, febrile illness w/staph epi + BC (07/04/23) 07/06/23 BC x2 negative for 5 days ***   Paroxysmal AFib CHA2DS2Vasc is at least 6, on Eliquis , *** appropriately dosed ***  NSTEMI Cath as above, ?RCA *** Sees gen cards team 09/06/23  RV failure Last echo with normalized RV function Preserved LVEF *** C/w  gen cards team  Secondary hypercoagulable state 2/2 AFib   Dispo: ***  Signed, Charlies Macario Arthur, PA-C

## 2023-08-18 ENCOUNTER — Ambulatory Visit: Attending: Physician Assistant | Admitting: Physician Assistant

## 2023-08-24 ENCOUNTER — Other Ambulatory Visit: Payer: Self-pay

## 2023-08-24 ENCOUNTER — Ambulatory Visit: Admitting: Infectious Diseases

## 2023-08-24 ENCOUNTER — Encounter: Payer: Self-pay | Admitting: Infectious Diseases

## 2023-08-24 VITALS — BP 128/85 | HR 95 | Temp 97.7°F | Wt 149.0 lb

## 2023-08-24 DIAGNOSIS — R300 Dysuria: Secondary | ICD-10-CM

## 2023-08-24 DIAGNOSIS — Z79899 Other long term (current) drug therapy: Secondary | ICD-10-CM | POA: Diagnosis not present

## 2023-08-24 DIAGNOSIS — R7881 Bacteremia: Secondary | ICD-10-CM | POA: Diagnosis not present

## 2023-08-24 DIAGNOSIS — B957 Other staphylococcus as the cause of diseases classified elsewhere: Secondary | ICD-10-CM | POA: Diagnosis not present

## 2023-08-24 MED ORDER — AMOXICILLIN-POT CLAVULANATE 500-125 MG PO TABS: 1.0000 | ORAL_TABLET | Freq: Two times a day (BID) | ORAL | 0 refills | Status: AC

## 2023-08-24 NOTE — Progress Notes (Signed)
 Patient Active Problem List   Diagnosis Date Noted   Shortness of breath 07/25/2023   AKI (acute kidney injury) (HCC) 07/23/2023   Non-ST elevation (NSTEMI) myocardial infarction (HCC) 07/14/2023   Acute diastolic CHF (congestive heart failure) (HCC) 07/14/2023   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   Paroxysmal atrial fibrillation (HCC) 07/14/2023   Physical deconditioning 07/14/2023   Sinus node dysfunction (HCC) 07/11/2023   Coagulase negative Staphylococcus bacteremia 07/06/2023   Cardiogenic shock (HCC) 06/27/2023   S/P lumbar fusion 12/31/2018   PTSD (post-traumatic stress disorder) 11/10/2017   Hypertriglyceridemia 10/07/2015   Abnormal nuclear stress test    Chest pain 07/23/2015   Palpitations 07/23/2015   Depression    Left-sided weakness 05/24/2014   Hyponatremia 05/24/2014   Right arm numbness 05/24/2014   Alcohol abuse 05/24/2014   Alcohol abuse with intoxication (HCC) 05/24/2014   Syncope and collapse 05/24/2014   History of CVA (cerebrovascular accident)    Essential hypertension    Hyperlipidemia 04/04/2013    Patient's Medications  New Prescriptions   No medications on file  Previous Medications   ACETAMINOPHEN  (TYLENOL ) 500 MG TABLET    Take 500 mg by mouth every 6 (six) hours as needed for mild pain (pain score 1-3).   ALPRAZOLAM  (XANAX ) 0.5 MG TABLET    TAKE 1/2 TO 1 TABLET BY MOUTH TWICE DAILY AS NEEDED   APIXABAN  (ELIQUIS ) 5 MG TABS TABLET    Take 1 tablet (5 mg total) by mouth 2 (two) times daily.   ASPIRIN  EC 81 MG TABLET    Take 1 tablet (81 mg total) by mouth daily. Swallow whole.   EVOLOCUMAB  (REPATHA  SURECLICK) 140 MG/ML SOAJ    INJECT 1 DOSE INTO THE SKIN EVERY 14 DAYS   LEVOTHYROXINE  (SYNTHROID ) 50 MCG TABLET    Take 50 mcg by mouth daily.   MAGNESIUM  OXIDE (MAG-OX) 400 MG TABLET    Take 400 mg by mouth daily.   PANTOPRAZOLE  (PROTONIX ) 40 MG TABLET    Take 1 tablet (40 mg total) by mouth daily.   SERTRALINE  (ZOLOFT ) 100 MG  TABLET    Take 1 tablet (100 mg total) by mouth daily.   SPIRONOLACTONE  (ALDACTONE ) 25 MG TABLET    Take 1 tablet (25 mg total) by mouth daily.   TORSEMIDE  (DEMADEX ) 20 MG TABLET    Take 1 tablet (20 mg total) by mouth daily.   ZOLPIDEM  (AMBIEN ) 5 MG TABLET    TAKE 1 TABLET BY MOUTH AT BEDTIME AS NEEDED FOR SLEEP  Modified Medications   No medications on file  Discontinued Medications   No medications on file    Subjective: Discussed the use of AI scribe software for clinical note transcription with the patient, who gave verbal consent to proceed.   73 year old female with prior history of CVA, HTN, HLD, alcohol use disorder, Tachybrady syndrome, Depression/PTSD who is here for HFU ( 6/10-6/27 hospital admission ) for nosocomial MSSE bacteremia. Discharged on 6/27 to complete 2 weeks of IV cefazolin  through 7/3> po linezolid  for 2 more weeks.   Interval events  Re-admitted 7/6-7/8  with SOB, chest pain and lightheadedness. TTE was unremarkable. She was discharged after symptomatic improvement.   8/7 Reports completion of IV cefazolin  as planned, PICC removed. Started taking PO linezolid  after completion of IV course which she has completed approx 3 weeks ago.  Since yesterday, she experiences dysuria, urinary frequency, and difficulty voiding, with only small amounts being passed. She also has persistent back pain since last night.  She is on a diuretic, which she believes causes frequent urination, approximately a couple of dozen times a day. However, this morning she was unable to urinate more than a few drops, causing burning.   ROS: Denies suprapubic pain, flank pain, denies nausea, vomiting, abdominal pain and diarrhea.   Review of Systems: all systems reviewed with pertinent positives and negatives as listed above.   Past Medical History:  Diagnosis Date   Alcohol intoxication (HCC) 05/2014    ED note    Anxiety    Arthritis    Boils    Chronic back pain    Community acquired  pneumonia 01/05/2016   Depression    Fibromyalgia    GERD (gastroesophageal reflux disease)    Heart attack (HCC)    Hepatic cyst 01/06/2016   Hyperlipidemia    Hypertension    Insomnia    Leg cramps    Migraines    with aura   Mini stroke 05/2014   with paralysis for 1 hr   Osteopenia    PONV (postoperative nausea and vomiting)    severe   Postmenopausal bleeding 12/2015   PTSD (post-traumatic stress disorder)    S/P epidural steroid injection    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, passed out, 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 followed up with eye doctor no further issues   Spinal stenosis    Squamous cell carcinoma 12/25/2015   right leg and nose   Strep throat 01/05/2016   Stroke (HCC)    3 strokes, no residual    Social History   Tobacco Use   Smoking status: Former    Types: Cigarettes   Smokeless tobacco: Never  Vaping Use   Vaping status: Never Used  Substance Use Topics   Alcohol use: No   Drug use: No    Family History  Problem Relation Age of Onset   Stroke Father    Depression Father    Stroke Mother    Hypertension Brother    Heart disease Brother        stint...PACER   Heart attack Brother    CAD Brother    Heart attack Maternal Uncle    Heart attack Maternal Grandmother    Heart attack Brother    Non-Hodgkin's lymphoma Brother    CAD Brother    Heart attack Maternal Uncle    Heart attack Maternal Uncle    Heart attack Maternal Uncle    Heart attack Maternal Uncle    Cholesteatoma Sister    Hyperlipidemia Sister    Other Brother        SUICIDE 05/2016    Allergies  Allergen Reactions   Codeine Other (See Comments)    headache   Crestor [Rosuvastatin] Other (See Comments)    Muscle aches   Other Nausea And Vomiting    Anesthesia-severe vomiting   Simvastatin Other (See Comments)    Muscle aches   Egg White (Egg Protein)     Other Reaction(s): Throat closes   Ezetimibe      Other Reaction(s): crippling muscle aches    Hydrocodone-Acetaminophen      Other Reaction(s): migraines   Peanut (Diagnostic)     Other Reaction(s): itchy   Shellfish Allergy Other (See Comments)    Throat swelling   Statins     Health Maintenance  Topic Date Due   Zoster Vaccines- Shingrix (1 of 2) Never done   MAMMOGRAM  12/29/2018   COVID-19 Vaccine (1 - 2024-25 season) Never done  INFLUENZA VACCINE  08/18/2023   Medicare Annual Wellness (AWV)  10/27/2023   DTaP/Tdap/Td (2 - Td or Tdap) 09/15/2026   Colonoscopy  12/24/2030   Pneumococcal Vaccine: 50+ Years  Completed   DEXA SCAN  Completed   Hepatitis C Screening  Completed   Hepatitis B Vaccines  Aged Out   HPV VACCINES  Aged Out   Meningococcal B Vaccine  Aged Out    Objective: BP 128/85   Pulse 95   Temp 97.7 F (36.5 C) (Oral)   Wt 149 lb (67.6 kg)   LMP 07/18/2011   SpO2 98%   BMI 26.39 kg/m    Physical Exam Constitutional:      Appearance: Normal appearance.  HENT:     Head: Normocephalic and atraumatic.      Mouth: Mucous membranes are moist.  Eyes:    Conjunctiva/sclera: Conjunctivae normal.     Pupils: Pupils are equal, round, and b/l symmetrical    Cardiovascular:     Rate and Rhythm: Normal rate and regular rhythm.     Heart sounds:   Pulmonary:     Effort: Pulmonary effort is normal.     Breath sounds:  Abdominal:     General: Non distended     Palpations: No renal angle tenderness  Musculoskeletal:        General: Normal range of motion.   Skin:    General: Skin is warm and dry.     Comments:  Neurological:     General: grossly non focal     Mental Status: awake, alert and oriented to person, place, and time.   Psychiatric:        Mood and Affect: Mood normal.   Lab Results Lab Results  Component Value Date   WBC 6.0 07/25/2023   HGB 11.3 (L) 07/25/2023   HCT 33.3 (L) 07/25/2023   MCV 92.5 07/25/2023   PLT 186 07/25/2023    Lab Results  Component Value Date   CREATININE 0.77 07/25/2023   BUN 9 07/25/2023    NA 134 (L) 07/25/2023   K 4.0 07/25/2023   CL 98 07/25/2023   CO2 27 07/25/2023    Lab Results  Component Value Date   ALT 13 07/25/2023   AST 28 07/25/2023   ALKPHOS 54 07/25/2023   BILITOT 0.7 07/25/2023    Lab Results  Component Value Date   CHOL 235 (H) 05/25/2014   HDL 38 (L) 05/25/2014   LDLCALC 127 (H) 05/25/2014   TRIG 352 (H) 05/25/2014   CHOLHDL 6.2 05/25/2014   Lab Results  Component Value Date   LABRPR NON REAC 01/27/2016   No results found for: HIV1RNAQUANT, HIV1RNAVL, CD4TABS  Imaging 07/23/23 CXR FINDINGS: Frontal and lateral views of the chest demonstrate chronic elevation of the right hemidiaphragm. Cardiac silhouette is unremarkable. No airspace disease, effusion, or pneumothorax. No acute bony abnormalities.   IMPRESSION: 1. No acute intrathoracic process. 07/23/23 TTE  IMPRESSIONS  1. Left ventricular ejection fraction, by estimation, is 65 to 70%. The  left ventricle has normal function. The left ventricle has no regional  wall motion abnormalities. Left ventricular diastolic parameters were  normal.   2. Right ventricular systolic function is normal. The right ventricular  size is normal. Tricuspid regurgitation signal is inadequate for assessing  PA pressure.   3. The mitral valve is normal in structure. Trivial mitral valve  regurgitation. No evidence of mitral stenosis.   4. The aortic valve was not well visualized. Aortic valve regurgitation  is not visualized. No aortic stenosis is present.   5. The inferior vena cava is normal in size with greater than 50%  respiratory variability, suggesting right atrial pressure of 3 mmHg.   Assessment/Plan # MSSE bacteremia  - nosocomial related, in the setting of multiple devices and lines  - 6/19 blood cx negative  - 7/7 TEE no vegetations - CT with small ulcerated plaque in the descending thoracic aorta (has been reviewed by cardiology with the CTS and VVS with changes 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)    - s/p course of IV cefazolin > po linezolid    Plan - no need for further antibiotics  - fu as needed   # Dysuria  - ? UTI vs diuretic related - check UA and urine cx - Augmentin  500/125 mg po bid for 5 days empirically   # Tachybradycardia syndrome/pA Fib # CHF - fu with Cardiology   I spent 35 minutes involved in face-to-face and non-face-to-face activities for this patient on the day of the visit. Professional time spent includes the following activities: Preparing to see the patient (review of tests), Obtaining and reviewing separately obtained history (discharge record 6/27, 7/8), Performing a medically appropriate examination and evaluation, Ordering labs and medications, Documenting clinical information in the EMR, Independently interpreting results (not separately reported), Communicating results to the patient, Counseling and educating the patient and Care coordination (not separately reported).   Of note, portions of this note may have been created with voice recognition software. While this note has been edited for accuracy, occasional wrong-word or 'sound-a-like' substitutions may have occurred due to the inherent limitations of voice recognition software.   Annalee Joseph, MD [facility] 08/24/2023, 3:18 PM

## 2023-08-26 ENCOUNTER — Other Ambulatory Visit: Payer: Self-pay | Admitting: Psychiatry

## 2023-08-26 DIAGNOSIS — G4721 Circadian rhythm sleep disorder, delayed sleep phase type: Secondary | ICD-10-CM

## 2023-08-26 DIAGNOSIS — F5105 Insomnia due to other mental disorder: Secondary | ICD-10-CM

## 2023-08-27 ENCOUNTER — Other Ambulatory Visit: Payer: Self-pay | Admitting: Infectious Diseases

## 2023-08-27 ENCOUNTER — Ambulatory Visit: Payer: Self-pay | Admitting: Infectious Diseases

## 2023-08-27 LAB — URINALYSIS W MICROSCOPIC + REFLEX CULTURE
Bilirubin Urine: NEGATIVE
Glucose, UA: NEGATIVE
Hyaline Cast: NONE SEEN /LPF
Ketones, ur: NEGATIVE
Nitrites, Initial: NEGATIVE
Specific Gravity, Urine: 1.006 (ref 1.001–1.035)
Squamous Epithelial / HPF: NONE SEEN /HPF (ref <=5)
pH: 7 (ref 5.0–8.0)

## 2023-08-27 LAB — URINE CULTURE
MICRO NUMBER:: 16801755
SPECIMEN QUALITY:: ADEQUATE

## 2023-08-27 LAB — CULTURE INDICATED

## 2023-08-27 MED ORDER — NITROFURANTOIN MONOHYD MACRO 100 MG PO CAPS: 100.0000 mg | ORAL_CAPSULE | Freq: Two times a day (BID) | ORAL | 0 refills | Status: AC

## 2023-08-28 NOTE — Telephone Encounter (Signed)
 Spoke with patient and reviewed results. No questions at this time. Julien Odor, RMA

## 2023-09-05 DIAGNOSIS — M4800 Spinal stenosis, site unspecified: Secondary | ICD-10-CM | POA: Insufficient documentation

## 2023-09-05 DIAGNOSIS — M797 Fibromyalgia: Secondary | ICD-10-CM | POA: Insufficient documentation

## 2023-09-05 DIAGNOSIS — Z860100 Personal history of colon polyps, unspecified: Secondary | ICD-10-CM | POA: Insufficient documentation

## 2023-09-05 DIAGNOSIS — G720 Drug-induced myopathy: Secondary | ICD-10-CM | POA: Insufficient documentation

## 2023-09-05 DIAGNOSIS — L0292 Furuncle, unspecified: Secondary | ICD-10-CM | POA: Insufficient documentation

## 2023-09-05 DIAGNOSIS — M1990 Unspecified osteoarthritis, unspecified site: Secondary | ICD-10-CM | POA: Insufficient documentation

## 2023-09-05 DIAGNOSIS — G4700 Insomnia, unspecified: Secondary | ICD-10-CM | POA: Insufficient documentation

## 2023-09-05 DIAGNOSIS — R252 Cramp and spasm: Secondary | ICD-10-CM | POA: Insufficient documentation

## 2023-09-05 DIAGNOSIS — Z9889 Other specified postprocedural states: Secondary | ICD-10-CM | POA: Insufficient documentation

## 2023-09-05 DIAGNOSIS — R030 Elevated blood-pressure reading, without diagnosis of hypertension: Secondary | ICD-10-CM | POA: Insufficient documentation

## 2023-09-05 DIAGNOSIS — R7989 Other specified abnormal findings of blood chemistry: Secondary | ICD-10-CM | POA: Insufficient documentation

## 2023-09-05 DIAGNOSIS — I639 Cerebral infarction, unspecified: Secondary | ICD-10-CM | POA: Insufficient documentation

## 2023-09-05 DIAGNOSIS — Z92241 Personal history of systemic steroid therapy: Secondary | ICD-10-CM | POA: Insufficient documentation

## 2023-09-05 DIAGNOSIS — F419 Anxiety disorder, unspecified: Secondary | ICD-10-CM | POA: Insufficient documentation

## 2023-09-05 DIAGNOSIS — G43909 Migraine, unspecified, not intractable, without status migrainosus: Secondary | ICD-10-CM | POA: Insufficient documentation

## 2023-09-05 DIAGNOSIS — R569 Unspecified convulsions: Secondary | ICD-10-CM | POA: Insufficient documentation

## 2023-09-05 DIAGNOSIS — G8929 Other chronic pain: Secondary | ICD-10-CM | POA: Insufficient documentation

## 2023-09-05 DIAGNOSIS — M8580 Other specified disorders of bone density and structure, unspecified site: Secondary | ICD-10-CM | POA: Insufficient documentation

## 2023-09-05 DIAGNOSIS — K5730 Diverticulosis of large intestine without perforation or abscess without bleeding: Secondary | ICD-10-CM | POA: Insufficient documentation

## 2023-09-05 DIAGNOSIS — I219 Acute myocardial infarction, unspecified: Secondary | ICD-10-CM | POA: Insufficient documentation

## 2023-09-05 DIAGNOSIS — K219 Gastro-esophageal reflux disease without esophagitis: Secondary | ICD-10-CM | POA: Insufficient documentation

## 2023-09-05 NOTE — Progress Notes (Unsigned)
 Cardiology Office Note   Date:  09/08/2023  ID:  [name], Vicki [name] 02-Jul-1950, MRN [id number] PCP: Vicki Pellet, MD  [facility] Providers Cardiologist:  Vinie JAYSON Maxcy, MD Cardiology APP:  Carlin Delon JAYSON, NP     History of Present Illness Vicki [name] is a 73 y.o. female with a past medical history of HFpEF, CAD, PAF, history of stroke 2012, dyslipidemia with statin intolerance.  08/04/2023 monitor average heart rate 83 bpm, predominant rhythm was sinus, 48 episodes of SVT occurred 07/23/2023 echo EF 65 to 70%, trivial MR 07/06/2023 echo EF 60-65%, small pericardial effusion present 06/27/2023 cardiac cath severe RV failure, profound shock, required Impella   Previously established with Dr. Dann and Vicki [name] for evaluation of chest pain, she had had a stress evaluation which was abnormal and subsequently underwent left heart cath in 2017 revealing nonobstructive CAD.  It then appeared she follows with Dr. Launie at [facility] cardiology was last seen by them in 2022, she had had a stroke, dyslipidemia with statin intolerance and was tried on Repatha  however this caused some type of local reaction and was since discontinued.   In June 2025 she was admitted to [facility] for 3-day history of severe substernal chest pain, she was hypotensive, in a junctional bradycardic rhythm, high density of troponin was elevated at 5300, CT was negative for PE but revealed a small ulcerated plaque and intramural hematoma >> started on norepinephrine  and admitted for cardiogenic shock.  She was taken for cardiac cath revealing severe RV failure with profound shock requiring Impella support.  EP was consulted for her junctional rhythm and she required a temporary transvenous pacemaker however she ultimately recovered from this and did not require permanent pacemaker.  She wore a monitor outpatient which revealed an average heart rate 83 bpm, no prolonged pauses. Admitted to [facility] on  07/23/2023 for chest pain, work up was unremarkable and symptoms had largely resolved upon arrival , echo was largely normal and she was subsequently discharged.   She presents for follow-up after recent hospitalization as outlined above.  She is improving slowly, feels she is about back to 50% of what she was before.  She is trying to increase her physical activity at home. She is bothered by fatigue, but no other real complaints. We discussed cardiac rehab but is not inclined to participate as she does not have transportation.   ROS: Review of Systems  Constitutional:  Positive for malaise/fatigue.  All other systems reviewed and are negative.    Studies Reviewed      Cardiac Studies & Procedures   ______________________________________________________________________________________________ CARDIAC CATHETERIZATION  CARDIAC CATHETERIZATION 07/05/2023  Conclusion Successful placement of temporary dialysis catheter   CARDIAC CATHETERIZATION 06/27/2023  Conclusion   The left ventricular ejection fraction is greater than 65% by visual estimate.  Findings:  On NE 15  Ao = 123/68(93) LV = 97/19 RA = 18 RV = 35/17 PA =  35/21 (26) PCW = 19 Fick cardiac output/index = 2.3/1.3 Thermodilution CO/CI = 2.9/1.6 PVR = 0.8 WU Ao sat = 96% PA sat = 37%, 41% PAPi = 0.78  Assessment: 1. Left dominant coronary system with normal LM, LAD and LCx. Small non-dominant RCA not visualized (previously seen in 2017) 2. LVEF 65% 3. Evidence of severe RV failure with profound shock requiring placement of Impella RP support device  Vicki Fuel, MD 11:44 PM  Findings Coronary Findings Diagnostic  Dominance: Left  Left Main Vessel is normal in caliber.  Left Anterior Descending Vessel is angiographically normal.  Left Circumflex Vessel is angiographically normal. Sluggish flow down AV groove LCx  Right Coronary Artery Vessel was not visualized.  Intervention  No interventions  have been documented.   STRESS TESTS  MYOCARDIAL PERFUSION IMAGING 08/05/2015  Interpretation Summary  Nuclear stress EF: 79%.  No T wave inversion was noted during stress.  There was no ST segment deviation noted during stress.  Defect 1: There is a large defect of severe severity.  Findings consistent with ischemia.  This is an intermediate risk study.  Large size, severe intensity reversible (SDS 8) inferior wall defect suggestive of ischemia. LVEF 79% with normal wall motion. Abnormal TID ratio of 1.64 -consider possible multivessel CAD. This is an intermediate risk study.   ECHOCARDIOGRAM  ECHOCARDIOGRAM LIMITED 07/23/2023  Narrative ECHOCARDIOGRAM LIMITED REPORT    Patient Name:   Vicki [name] Date of Exam: 07/23/2023 Medical Rec #:  [id number]       Height:       63.0 in Accession #:    [id number]      Weight:       155.9 lb Date of Birth:  18-Dec-1950       BSA:          1.739 m Patient Age:    73 years        BP:           126/78 mmHg Patient Gender: F               HR:           74 bpm. Exam Location:  Inpatient  Procedure: Limited Echo and Limited Color Doppler (Both Spectral and Color Flow Doppler were utilized during procedure).  Indications:    CHF I50.9  History:        Patient has prior history of Echocardiogram examinations, most recent 07/06/2023. CHF, Previous Myocardial Infarction, Stroke, Arrythmias:Atrial Fibrillation, Tachycardia and Bradycardia, Signs/Symptoms:Chest Pain and Syncope; Risk Factors:Hypertension and Dyslipidemia.  Sonographer:    Thea Norlander RCS Referring Phys: [name]   Sonographer Comments: Image acquisition challenging due to breast implants. IMPRESSIONS   1. Left ventricular ejection fraction, by estimation, is 65 to 70%. The left ventricle has normal function. The left ventricle has no regional wall motion abnormalities. Left ventricular diastolic parameters were normal. 2. Right ventricular systolic function  is normal. The right ventricular size is normal. Tricuspid regurgitation signal is inadequate for assessing PA pressure. 3. The mitral valve is normal in structure. Trivial mitral valve regurgitation. No evidence of mitral stenosis. 4. The aortic valve was not well visualized. Aortic valve regurgitation is not visualized. No aortic stenosis is present. 5. The inferior vena cava is normal in size with greater than 50% respiratory variability, suggesting right atrial pressure of 3 mmHg.  FINDINGS Left Ventricle: Left ventricular ejection fraction, by estimation, is 65 to 70%. The left ventricle has normal function. The left ventricle has no regional wall motion abnormalities. The left ventricular internal cavity size was small. There is no left ventricular hypertrophy. Left ventricular diastolic parameters were normal.  Right Ventricle: The right ventricular size is normal. No increase in right ventricular wall thickness. Right ventricular systolic function is normal. Tricuspid regurgitation signal is inadequate for assessing PA pressure.  Pericardium: There is no evidence of pericardial effusion.  Mitral Valve: The mitral valve is normal in structure. Trivial mitral valve regurgitation. No evidence of mitral valve stenosis.  Aortic Valve: The aortic valve was not well  visualized. Aortic valve regurgitation is not visualized. No aortic stenosis is present. Aortic valve peak gradient measures 5.8 mmHg.  Aorta: The aortic root is normal in size and structure.  Venous: The inferior vena cava is normal in size with greater than 50% respiratory variability, suggesting right atrial pressure of 3 mmHg.  LEFT VENTRICLE PLAX 2D LVIDd:         2.90 cm   Diastology LVIDs:         1.90 cm   LV e' medial:    7.51 cm/s LV PW:         1.20 cm   LV E/e' medial:  9.8 LV IVS:        0.90 cm   LV e' lateral:   9.79 cm/s LVOT diam:     2.20 cm   LV E/e' lateral: 7.5 LV SV:         52 LV SV Index:   30 LVOT  Area:     3.80 cm   RIGHT VENTRICLE             IVC RV S prime:     12.00 cm/s  IVC diam: 1.00 cm TAPSE (M-mode): 1.6 cm  LEFT ATRIUM         Index LA diam:    2.70 cm 1.55 cm/m AORTIC VALVE AV Area (Vmax): 2.46 cm AV Vmax:        120.00 cm/s AV Peak Grad:   5.8 mmHg LVOT Vmax:      77.60 cm/s LVOT Vmean:     50.300 cm/s LVOT VTI:       0.136 m  AORTA Ao Root diam: 3.10 cm Ao Asc diam:  3.80 cm  MITRAL VALVE MV Area (PHT): 2.60 cm    SHUNTS MV Decel Time: 292 msec    Systemic VTI:  0.14 m MV E velocity: 73.60 cm/s  Systemic Diam: 2.20 cm MV A velocity: 82.80 cm/s MV E/A ratio:  0.89  Lonni Nanas MD Electronically signed by Lonni Nanas MD Signature Date/Time: 07/23/2023/7:34:53 PM    Final   TEE  ECHO TEE 07/10/2023  Narrative TRANSESOPHOGEAL ECHO REPORT    Patient Name:   TEASHA MURRILLO Date of Exam: 07/10/2023 Medical Rec #:  [id number]       Height:       63.0 in Accession #:    [id number]      Weight:       165.8 lb Date of Birth:  1950/12/25       BSA:          1.785 m Patient Age:    73 years        BP:           163/86 mmHg Patient Gender: F               HR:           69 bpm. Exam Location:  Inpatient  Procedure: Transesophageal Echo and Color Doppler (Both Spectral and Color Flow Doppler were utilized during procedure).  Indications:     I50.40* Unspecified combined systolic (congestive) and diastolic (congestive) heart failure  History:         Patient has prior history of Echocardiogram examinations, most recent 07/06/2023. CHF, Previous Myocardial Infarction, Abnormal ECG, Stroke, Signs/Symptoms:Chest Pain, Syncope and Bacteremia; Risk Factors:Dyslipidemia and Hypertension. Cardiac shock. ETOH.  Sonographer:     Ellouise Mose RDCS Referring Phys:  [id number] AMY D CLEGG Diagnosing Phys: Ria Commander  PROCEDURE:  After discussion of the risks and benefits of a TEE, an informed consent was obtained from the patient. The  transesophogeal probe was passed without difficulty through the esophogus of the patient. Imaged were obtained with the patient in a left lateral decubitus position. Sedation performed by performing physician. The patient was monitored while under deep sedation. Anesthestetic sedation was provided intravenously by Anesthesiology: 60mg  of Propofol . The patient's vital signs; including heart rate, blood pressure, and oxygen  saturation; remained stable throughout the procedure. The patient developed no complications during the procedure. Transgastric views not obtained.  IMPRESSIONS   1. Left ventricular ejection fraction, by estimation, is 60 to 65%. The left ventricle has normal function. 2. Right ventricular systolic function is low normal. The right ventricular size is normal. 3. No left atrial/left atrial appendage thrombus was detected. 4. The mitral valve is normal in structure. Trivial mitral valve regurgitation. No evidence of mitral stenosis. 5. The aortic valve is tricuspid. Aortic valve regurgitation is not visualized. No aortic stenosis is present.  Conclusion(s)/Recommendation(s): No evidence of vegetation/infective endocarditis on this transesophageael echocardiogram.  FINDINGS Left Ventricle: Left ventricular ejection fraction, by estimation, is 60 to 65%. The left ventricle has normal function. The left ventricular internal cavity size was normal in size.  Right Ventricle: The right ventricular size is normal. No increase in right ventricular wall thickness. Right ventricular systolic function is low normal.  Left Atrium: Left atrial size was normal in size. No left atrial/left atrial appendage thrombus was detected.  Right Atrium: Right atrial size was normal in size.  Pericardium: There is no evidence of pericardial effusion.  Mitral Valve: The mitral valve is normal in structure. Trivial mitral valve regurgitation. No evidence of mitral valve stenosis.  Tricuspid Valve:  The tricuspid valve is normal in structure. Tricuspid valve regurgitation is not demonstrated. No evidence of tricuspid stenosis.  Aortic Valve: The aortic valve is tricuspid. Aortic valve regurgitation is not visualized. No aortic stenosis is present.  Pulmonic Valve: The pulmonic valve was normal in structure. Pulmonic valve regurgitation is not visualized. No evidence of pulmonic stenosis.  Aorta: The aortic root is normal in size and structure.  IAS/Shunts: No atrial level shunt detected by color flow Doppler.  Additional Comments: A device lead is visualized in the right ventricle and right atrium.  Aditya Sabharwal Electronically signed by Ria Commander Signature Date/Time: 07/24/2023/8:54:53 AM    Final  MONITORS  LONG TERM MONITOR-LIVE TELEMETRY (3-14 DAYS) 08/04/2023  Narrative Patch Wear Time:  12 days and 20 hours (2025-06-27T11:33:33-0400 to 2025-07-10T08:12:04-398)  Patient had a min HR of 46 bpm, max HR of 169 bpm, and avg HR of 83 bpm. Predominant underlying rhythm was Sinus Rhythm. 48 Supraventricular Tachycardia runs occurred, the run with the fastest interval lasting 4 beats with a max rate of 169 bpm, the longest lasting 51.2 secs with an avg rate of 108 bpm. Idioventricular Rhythm was present. Isolated SVEs were occasional (2.2%, 33309), SVE Couplets were rare (<1.0%, 1080), and SVE Triplets were rare (<1.0%, 564). Isolated VEs were rare (<1.0%, 2622), VE Couplets were rare (<1.0%, 97), and VE Triplets were rare (<1.0%, 14). Ventricular Bigeminy and Trigeminy were present. Difficulty discerning atrial activity making definitive diagnosis difficult to ascertain.  Monitor showed sinus rhythm with 48 episodes of PSVT lasting up to 51 seconds. PACs with a burden of 2.2% was reported.  Vinie KYM Maxcy, MD, [facility], FNLA, FACP [place]  [facility] Medical Director of the [facility]id [facility] Cardiovascular Risk Reduction [facility] Diplomate  of the [employer] of Clinical Lipidology Attending Cardiologist Direct Dial: [phone]  Fax: [phone] Website:  [email or web address]       ______________________________________________________________________________________________      Risk Assessment/Calculations CHA2DS2-VASc Score = 7 [CHF History: 1, HTN History: 1, Diabetes History: 0, Stroke History: 2, Vascular Disease History: 1, Age Score: 1, Gender Score: 1].  Therefore, the patient's annual risk of stroke is 11.2 %.           Physical Exam VS:  BP 130/88   Pulse 89   Ht 5' 3 (1.6 m)   Wt 149 lb 12.8 oz (67.9 kg)   LMP 07/18/2011   SpO2 96%   BMI 26.54 kg/m        Wt Readings from Last 3 Encounters:  09/06/23 149 lb 12.8 oz (67.9 kg)  08/24/23 149 lb (67.6 kg)  07/25/23 151 lb 3.8 oz (68.6 kg)    GEN: Well nourished, well developed in no acute distress NECK: No JVD; No carotid bruits CARDIAC: RRR, no murmurs, rubs, gallops RESPIRATORY:  Clear to auscultation without rales, wheezing or rhonchi  ABDOMEN: Soft, non-tender, non-distended EXTREMITIES:  No edema; No deformity   ASSESSMENT AND PLAN Cardiogenic shock secondary to acute right ventricular failure from right ventricular infarction, in the setting of a diminutive right coronary artery -NYHA class II for fatigue, she continues to recover.  Continue spironolactone  25 mg daily, Demadex  20 mg daily, not currently on a beta-blocker secondary to sinus node dysfunction.  Sinus node dysfunction-required temporary pacemaker during her hospitalization, monitor was arranged after this revealing that dysfunction had improved.    PAF/hypercoagulable state -CHA2DS2-VASc score is 7, EKG on 07/23/2023 revealed normal sinus rhythm, continue Eliquis  5 mg twice daily--no indication for dose reduction-most recent creatinine 0.77, age 39, hemoglobin 11.3 and 33.3 on 07/25/2023.     Cardiac Rehabilitation Eligibility Assessment  The patient has declined or is not appropriate for  cardiac rehabilitation. (patient declined, no transportation, feels she can complete on her own)       Dispo:  Follow up with Dr. Liborio in 6 weeks.   Signed, Delon JAYSON Hoover, NP

## 2023-09-06 ENCOUNTER — Ambulatory Visit: Attending: Cardiology | Admitting: Cardiology

## 2023-09-06 ENCOUNTER — Encounter: Payer: Self-pay | Admitting: Cardiology

## 2023-09-06 VITALS — BP 130/88 | HR 89 | Ht 63.0 in | Wt 149.8 lb

## 2023-09-06 DIAGNOSIS — I480 Paroxysmal atrial fibrillation: Secondary | ICD-10-CM

## 2023-09-06 DIAGNOSIS — D6859 Other primary thrombophilia: Secondary | ICD-10-CM | POA: Diagnosis not present

## 2023-09-06 DIAGNOSIS — T466X5D Adverse effect of antihyperlipidemic and antiarteriosclerotic drugs, subsequent encounter: Secondary | ICD-10-CM

## 2023-09-06 DIAGNOSIS — R001 Bradycardia, unspecified: Secondary | ICD-10-CM | POA: Diagnosis not present

## 2023-09-06 DIAGNOSIS — R570 Cardiogenic shock: Secondary | ICD-10-CM

## 2023-09-06 DIAGNOSIS — M791 Myalgia, unspecified site: Secondary | ICD-10-CM

## 2023-09-06 NOTE — Patient Instructions (Signed)
 Medication Instructions:  Your physician recommends that you continue on your current medications as directed. Please refer to the Current Medication list given to you today.  *If you need a refill on your cardiac medications before your next appointment, please call your pharmacy*  Lab Work: None If you have labs (blood work) drawn today and your tests are completely normal, you will receive your results only by: MyChart Message (if you have MyChart) OR A paper copy in the mail If you have any lab test that is abnormal or we need to change your treatment, we will call you to review the results.  Testing/Procedures: None  Follow-Up: At [facility], you and your health needs are our priority.  As part of our continuing mission to provide you with exceptional heart care, our providers are all part of one team.  This team includes your primary Cardiologist (physician) and Advanced Practice Providers or APPs (Physician Assistants and Nurse Practitioners) who all work together to provide you with the care you need, when you need it.  Your next appointment:   6 week(s)  Provider:   Alean Kobus, MD    We recommend signing up for the patient portal called MyChart.  Sign up information is provided on this After Visit Summary.  MyChart is used to connect with patients for Virtual Visits (Telemedicine).  Patients are able to view lab/test results, encounter notes, upcoming appointments, etc.  Non-urgent messages can be sent to your provider as well.   To learn more about what you can do with MyChart, go to [email or web address].   Other Instructions None

## 2023-10-18 ENCOUNTER — Ambulatory Visit

## 2023-10-18 VITALS — BP 128/78 | HR 88 | Ht 63.0 in | Wt 152.6 lb

## 2023-10-18 DIAGNOSIS — I480 Paroxysmal atrial fibrillation: Secondary | ICD-10-CM | POA: Diagnosis not present

## 2023-10-18 DIAGNOSIS — E782 Mixed hyperlipidemia: Secondary | ICD-10-CM | POA: Diagnosis not present

## 2023-10-18 DIAGNOSIS — I71019 Dissection of thoracic aorta, unspecified: Secondary | ICD-10-CM | POA: Insufficient documentation

## 2023-10-18 DIAGNOSIS — I495 Sick sinus syndrome: Secondary | ICD-10-CM | POA: Diagnosis not present

## 2023-10-18 DIAGNOSIS — I50812 Chronic right heart failure: Secondary | ICD-10-CM

## 2023-10-18 DIAGNOSIS — I498 Other specified cardiac arrhythmias: Secondary | ICD-10-CM | POA: Insufficient documentation

## 2023-10-18 DIAGNOSIS — I10 Essential (primary) hypertension: Secondary | ICD-10-CM | POA: Diagnosis not present

## 2023-10-18 DIAGNOSIS — I2510 Atherosclerotic heart disease of native coronary artery without angina pectoris: Secondary | ICD-10-CM

## 2023-10-18 HISTORY — DX: Atherosclerotic heart disease of native coronary artery without angina pectoris: I25.10

## 2023-10-18 HISTORY — DX: Dissection of thoracic aorta, unspecified: I71.019

## 2023-10-18 HISTORY — DX: Other specified cardiac arrhythmias: I49.8

## 2023-10-18 MED ORDER — NEXLETOL 180 MG PO TABS: 180.0000 mg | ORAL_TABLET | Freq: Every day | ORAL | Status: DC

## 2023-10-18 MED ORDER — NEXLETOL 180 MG PO TABS: 180.0000 mg | ORAL_TABLET | Freq: Every day | ORAL | 3 refills | Status: AC

## 2023-10-18 NOTE — Assessment & Plan Note (Signed)
 Currently do not have a fasting lipid panel to review. Will obtain baseline fasting lipid panel prior to next follow-up visit. Unable to tolerate statins, Zetia  due to severe muscle aches. Unable to tolerate PCSK9 inhibitors [Repatha ] due to flulike symptoms which were severe and she self discontinued the medication after 2 doses.  Agreeable to start bempedoic acid [Nexletol] after discussing action, benefits and potential risks. Will prescribe today.

## 2023-10-18 NOTE — Assessment & Plan Note (Signed)
 Paroxysmal atrial fibrillation during inpatient stay June 2025 in the setting of acute coronary syndrome and cardiogenic shock. No significant A-fib burden on Zio patch monitor June 2025, outpatient follow-up.  CHA2DS2-VASc score elevated. Has been started on Eliquis  prior to discharge in June. Tolerating well. Continue the same.

## 2023-10-18 NOTE — Assessment & Plan Note (Signed)
Well controlled on spironolactone.

## 2023-10-18 NOTE — Assessment & Plan Note (Signed)
 Accelerated idioventricular rhythm of unknown duration noted on outpatient event monitor from June 2025 during sleeping hours. There was note made of accelerated idioventricular rhythm on telemetry during inpatient stay 07/23/2023.  Some of these episodes were apparently correlating with symptoms.  She missed her follow-up appointment with electrophysiology as outpatient. She was evaluated by Dr. Kennyth electrophysiologist multiple times during her inpatient stay both in June and July.  Will recommend follow-up with him with regards to any further evaluation, as there is possible need for considering beta-blockers given her episodes of short but relatively sustained SVT episodes.

## 2023-10-18 NOTE — Progress Notes (Signed)
 Cardiology Consultation:    Date:  10/18/2023   ID:  Vicki [name], DOB 1950/02/15, MRN [id number]  PCP:  Vicki Pellet, MD  Cardiologist:  Vicki SAUNDERS Nero Sawatzky, MD   Referring MD: Vicki Pellet, MD   No chief complaint on file.    ASSESSMENT AND PLAN:   Vicki [name] 73 year old woman with history of CAD nonobstructive cardiac cath previously in 2017 now s/p NSTEMI-RV infarct with nonvisualized RCA on cardiac cath 06/27/2023, complicated by cardiogenic shock requiring mechanical circulatory support with RV Impella, subsequently normalized LV and RV function [on last echocardiogram 07/23/2023], complicated inpatient stay with new diagnosis of paroxysmal atrial fibrillation/flutter and tachybradycardia secondary to transient sinus node dysfunction requiring temporary transvenous pacemaker, outpatient heart monitor 14-day June 2025 noted 2.2% supraventricular ectopy burden and multiple short runs of SVT [with longest episode lasting 51 seconds], 1 instance of accelerated idioventricular rhythm, similar accelerated idioventricular rhythms were reported frequently on subsequent inpatient stay 07/23/2023, heart failure with preserved EF, hyperlipidemia [intolerance to statins, Zetia , Repatha ], small ulcerated plaque with 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  Seen today for first time by me.  Information from patient, medical records and patient's husband after the office visit today done over the phone this afternoon.  Problem List Items Addressed This Visit     Hypertension   Well-controlled on spironolactone .       Relevant Medications   Bempedoic Acid (NEXLETOL) 180 MG TABS   Bempedoic Acid (NEXLETOL) 180 MG TABS   Hyperlipidemia   Currently do not have a fasting lipid panel to review. Will obtain baseline fasting lipid panel prior  to next follow-up visit. Unable to tolerate statins, Zetia  due to severe muscle aches. Unable to tolerate PCSK9 inhibitors [Repatha ] due to flulike symptoms which were severe and she self discontinued the medication after 2 doses.  Agreeable to start bempedoic acid [Nexletol] after discussing action, benefits and potential risks. Will prescribe today.       Relevant Medications   Bempedoic Acid (NEXLETOL) 180 MG TABS   Bempedoic Acid (NEXLETOL) 180 MG TABS   Sinus node dysfunction (HCC)   Transient sinus node dysfunction during inpatient stay June 2025 requiring transient transvenous pacemaker. No significant pauses or high-grade conduction abnormalities on outpatient heart monitor June 2025.  Avoid negative chronotropic agents including beta-blockers. During inpatient and was felt not a candidate for permanent pacemaker or as the symptoms resolved.  Will recommend follow-up with electrophysiologist given her ongoing symptoms of occasional dizziness, short but relatively sustained runs of SVT on heart monitor and episodes of accelerated idioventricular rhythm noted on telemetry during inpatient stay and heart monitor, which were felt to be symptomatic.  Will request follow-up with Dr. Kennyth in electrophysiology as he has seen her for multiple visits.      Relevant Medications   Bempedoic Acid (NEXLETOL) 180 MG TABS   Bempedoic Acid (NEXLETOL) 180 MG TABS   Chronic CHF (congestive heart failure) (HCC) - Primary   Chronic CHF in the setting of recent RV infarct June 2025, requiring mechanical support with RV Impella. Recovered LVEF. Last echocardiogram 07/23/2023 normal biventricular function.  Euvolemic and compensated. Recommended to remain on low-salt diet less than 2 g/day. Continue with torsemide  20 mg once daily. Inpatient was started on spironolactone  25 mg once daily.  Tolerating well. Not a candidate for beta-blockers due to sinus node dysfunction.  Currently with normalized  biventricular function and normal blood pressures and symptoms of postural lightheadedness at times, will hold off on adding ACE inhibitor/ARB/Entresto. Will hold off on SGLT2 inhibitors at this time given multiple medical issues at this time.  Blood work from 08/15/2023 BUN 12, creatinine 0.86.   Will request follow-up echocardiogram for assessing biventricular function now that she has been relatively symptom-free to establish new baseline of her RV function.      Relevant Medications   Bempedoic Acid (NEXLETOL) 180 MG TABS   Bempedoic Acid (NEXLETOL) 180 MG TABS   Paroxysmal atrial fibrillation (HCC)   Paroxysmal atrial fibrillation during inpatient stay June 2025 in the setting of acute coronary syndrome and cardiogenic shock. No significant A-fib burden on Zio patch monitor June 2025, outpatient follow-up.  CHA2DS2-VASc score elevated. Has been started on Eliquis  prior to discharge in June. Tolerating well. Continue the same.      Relevant Medications   Bempedoic Acid (NEXLETOL) 180 MG TABS   Bempedoic Acid (NEXLETOL) 180 MG TABS   CAD (coronary artery disease)   Cardiac cath in July 2017 nonobstructive CAD with left dominant circulation and small nondominant RCA. Now s/p NSTEMI, cath 06/27/2023 with nonvisualized RCA.  No obstructive disease in left coronary circulation. Initial RV severe dysfunction on echocardiogram subsequently improved. Normal biventricular function on follow-up echocardiogram 07/23/2023.  Had atypical chest discomfort with normal flat troponins and no significant EKG changes on hospital visit 07/23/2023.  Had 2 atypical symptoms of chest pain in the past week. Functional status is good with day-to-day activities but does get anxious and off there were instances of feeling out of breath with more than mild exertion.  Continue with aspirin  81 mg once daily for now in addition to Eliquis . Currently not on any lipid-lowering agents. Discussed about Nexletol  [bempedoic acid] for lipid-lowering and cardiovascular risk reduction. Discussed potential risks with gout flareup [she does not have a history of gout], tendon rupture risk reported in certain populations. She is agreeable to proceed. Will start Nexletol [bempedoic acid].   Given her history informed her and her husband if she does have any episodes of chest pain or pressure discomfort which is not resolving within few minutes of rest should promptly go to the ER and also notify our office. She and her husband both verbalized understanding and are agreeable to do this.  Low threshold to consider repeat cardiac cath versus CT coronary imaging.      Relevant Medications   Bempedoic Acid (NEXLETOL) 180 MG TABS   Bempedoic Acid (NEXLETOL) 180 MG TABS   Accelerated idioventricular rhythm   Accelerated idioventricular rhythm of unknown duration noted on outpatient event monitor from June 2025 during sleeping hours. There was note made of accelerated idioventricular rhythm on telemetry during inpatient stay 07/23/2023.  Some of these episodes were apparently correlating with symptoms.  She missed her follow-up appointment with electrophysiology as outpatient. She was evaluated by Dr. Kennyth electrophysiologist multiple times during her inpatient stay both in June and July.  Will recommend follow-up with him with regards to any further evaluation, as there is possible need for considering beta-blockers given her episodes of short but relatively sustained SVT episodes.       Relevant Medications   Bempedoic Acid (NEXLETOL) 180 MG TABS   Bempedoic Acid (NEXLETOL) 180 MG TABS   Intramural hematoma of thoracic aorta (HCC)   Intramural hematoma associated with small ulcerated plaque of the thoracoabdominal descending aorta noted on CT imaging 06/27/2023. No follow-up imaging.  Results were apparently reviewed by ER physician with Dr.Cain from vascular surgery and findings were apparently felt to be  chronic. She had been subsequently started on Eliquis  for paroxysmal A-fib and has been tolerating well.  Will request follow-up with vascular surgeon to evaluate if any further follow-up imaging is required.      Relevant Medications   Bempedoic Acid (NEXLETOL) 180 MG TABS   Bempedoic Acid (NEXLETOL) 180 MG TABS   - Bempedoic acid being started 180 mg once daily - EP referral for sinus node dysfunction and accelerated idioventricular rhythms, symptomatic. - Transthoracic echocardiogram to follow-up on biventricular function, CHF - Vascular surgeon referral to formally evaluate and comment regarding descending aorta ulcerated plaque with intramural hematoma. - Order imaging CTA chest/abdomen/pelvis to assess aorta.  Follow-up in the clinic tentatively in 3 months.   History of Present Illness:    LOYALTY ARENTZ is a 73 y.o. female who is being seen today for follow-up visit and first visit with me. PCP is Vicki Pellet, MD. Last visit with our office was 09/06/2023 with Delon Hoover, NP-C.  Here for the visit by herself.  Lives with her husband at home.  Extremely complicated history with prolonged hospital admission in June and another short hospital admission in July.  By herself has limited insight into what all happened.  Her husband did not accompany him.  Had extensive discussion in the office today.  Subsequently called her at the end of office hours today to be able to speak with her husband Carlin Senegal' over the phone to get his insight into what is going on with her health.  Has history of CAD, chronic HFpEF LVEF 65 to 70%, Paroxsymal Afib, sinus node dysfunction, descending thoracic/abdominal aorta small ulcerated plaque with intramural hematoma like features, CVA in 2012, hyperlipidemia [intolerance to statins, Zetia , Repatha ],  Reviewed her multiple healthcare visits and summarized pertinent points here. Was apparently in her usual state of health until early  June. With on and off symptoms of chest pain she initially presented to urgent care facility on 06/25/2023, EKG did not show any obvious ST-T changes to suggest ischemia but with ongoing symptoms recommended to go to the ER.  She did not do so the same day.  2 days later on 06/27/2023 with ongoing acute episodes she went to the ER at [facility] for chest pain, EKG showed atrial fibrillation with intermittent junctional bradycardia and idioventricular rhythm, hemodynamically hypotensive and in shock requiring pressors, CTA chest/abdomen/pelvis rule out PE and aortic dissection with noted a small ulcerated plaque with a possible developing intramural hematoma 8 mm x 14 mm in size, this was reviewed by ER physician with Dr. Sheree from vascular surgery and felt the lesion was clinically not the reason for her acute presentation and the lesion was most likely chronic in nature.  I do not see any further follow-up imaging for this or review from vascular surgeon.  Cardiology was consulted and was promptly taken to the Cath Lab for right and left heart cath 06/27/2023 which noted left dominant circulation without any obstructive disease in left coronary circulation.  Right coronary was however not visualized [this was noted to be a small nondominant RCA on prior cardiac cath from July 2017 as noted in the screenshot of the cath image below].  With normal LV function and wall motion and severely depressed RV function with McConnell sign, and right heart cath showing features of severe right heart failure and cardiogenic shock, mechanical support was  provided with RP Impella [from 06/27/2023 through 07/02/2023].  Inpatient course was complicated with prolonged mechanical support with Impella that was subsequently weaned off successfully, acute renal injury responded well and fluid retention resolved with diuresis.  Had rhythm abnormalities with paroxysmal atrial fibrillation/flutter and symptomatic bradycardia  arrhythmia requiring transvenous pacemaker [07/05/2023 through 07/12/2023], and rhythm abnormalities were felt secondary to sinus node dysfunction and was dictated by electrophysiologists Dr. Waddell and Dr. Kennyth.  With stable rhythm and no significant pauses during rest of the inpatient stay discharged home with a heart monitor.  With staph epi bacteremia, inpatient TEE 07/10/2023 noted no evidence of endocarditis and normal function was note to be low normal and LVEF was normal.  Treated on extended course of IV antibiotic therapy. Discharged home on 07/14/2023.  heart monitor outpatient 07/14/2023 13-day study noted no significant pauses.  Predominantly sinus rhythm average heart rate 83/min ranging from 46 to 123 bpm; occasional supraventricular ectopy 2.2% burden, rare ventricular ectopy less than 1% and multiple runs of SVT [total 48 episodes] with the longest episode of Paroxysmal SVT up to 51 seconds followed by a short 2-second pause on 07/23/2023 6:29 PM and that evening when she was in the ER at [facility] being evaluated for dizziness and shortness of breath.  Also noted was on an unknown duration of idioventricular rhythm with heart rate up to 48/min during sleeping hours.  Screenshots of the tracings from the outpatient heart monitor as saved below.  He had a readmission to the hospital on 07/23/2023 as she presented to the hospital for symptoms of chest pain and dizziness associated with shortness of breath and orthopnea, brought to the ER by EMS who noted her to be transiently in idioventricular rhythm during transportation.  High-sensitivity troponins were unremarkable flat 24, 27, BNP not significantly elevated at 137.  Evaluated by cardiology service and was also assessed by Dr. Kennyth from electrophysiology. Her symptoms improved and was monitored on telemetry for couple nights and discharged home 07/25/2023.  There was note made about significant burden of accelerated idioventricular  rhythm on telemetry during her inpatient stay.  Missed her follow-up appointment with electrophysiology clinic on 08/18/2023.  Echocardiogram 07/23/2023 noted preserved LVEF 65 to 70%, RV size returning to normal with normal function.  Mentions she had 2 6 episodes of chest discomfort over the past week. First was Saturday afternoon, sharp shooting to burning type under the bra line noticed while at rest went on and off for about an hour.  Not associated with any shortness of breath. Second episode yesterday as she was going to bed felt restless, and felt an uneasy pulling sensation in the chest that went on for couple hours.  Once again not associated with any shortness of breath, lightheadedness or dizziness. At times when changing position she might feel transient lightheadedness. Couple weeks ago when she went to the grocery store felt lightheaded after walking for a while.  Denies any syncopal episodes. Towards the end of the day may notice mild puffiness in her ankles but otherwise mentions no significant swelling. No blood in urine or stools. Good compliance with her medications. However could not take statins, Zetia  due to side effects that she describes as crippling muscle aches. Could not tolerate Repatha  due to flulike symptoms and was only able to take 2 of her doses.   Recent blood work from 08/15/2023 scanned copy from PCPs office notes TSH normal 3.1 Sodium 136, potassium 3.9 BUN 12, creatinine 0.86 Hemoglobin 11.9, hematocrit 35.9,  platelets 342, WBC 10.4.  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   Dysuria 08/24/2023   Elevated blood-pressure reading without diagnosis of hypertension 09/05/2023   Elevated TSH 09/05/2023   Fibromyalgia    GERD (gastroesophageal reflux disease)    Heart attack (HCC)    Hepatic cyst 01/06/2016   History of colonic polyps 09/05/2023   History of CVA (cerebrovascular accident)    IMO SNOMED Dx Update Oct 2024     Hyperlipidemia    Hypertension    Hypertriglyceridemia 10/07/2015   Hyponatremia 05/24/2014   Insomnia    Left-sided weakness 05/24/2014   Leg cramps    Medication management 08/24/2023   Migraines    with aura   Mini stroke 05/2014   with paralysis for 1 hr   Non-ST elevation (NSTEMI) myocardial infarction (HCC) 07/14/2023   Osteopenia    Palpitations 07/23/2015   Paroxysmal atrial fibrillation (HCC) 07/14/2023   Physical deconditioning 07/14/2023   PONV (postoperative nausea and vomiting)    severe   Postmenopausal bleeding 12/2015   PTSD (post-traumatic stress disorder)    Right arm numbness 05/24/2014   S/P epidural steroid injection    S/P lumbar fusion 12/31/2018   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, passed out, 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 followed up with eye doctor no further issues   Shortness of breath 07/25/2023   Sinus node dysfunction (HCC) 07/11/2023   Spinal stenosis    Strep throat 01/05/2016   Stroke (HCC)    3 strokes, no residual   Syncope and collapse 05/24/2014   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    Past Surgical History:  Procedure Laterality Date   BREAST ENHANCEMENT SURGERY     CARDIAC CATHETERIZATION N/A 08/12/2015   Procedure: Left Heart Cath and Coronary Angiography;  Surgeon: Candyce GORMAN Reek, MD;  Location: [facility] IN[facility]ASIVE CV LAB;  Service: Cardiovascular;  Laterality: N/A;   COLONOSCOPY      DILATATION & CURETTAGE/HYSTEROSCOPY WITH MYOSURE N/A 02/22/2016   Procedure: DILATATION & CURETTAGE/HYSTEROSCOPY WITH MYOSURE;  Surgeon: Bobie FORBES Cathlyn JAYSON Nikki, MD;  Location: [facility];  Service: Gynecology;  Laterality: N/A;   FACIAL COSMETIC SURGERY     HAND SURGERY     nerve & tendon repair   LIPOSUCTION     LUMBAR EPIDURAL INJECTION  06/16/2015   RIGHT/LEFT HEART CATH AND CORONARY ANGIOGRAPHY N/A 06/27/2023   Procedure: RIGHT/LEFT HEART CATH AND CORONARY ANGIOGRAPHY;  Surgeon: Cherrie Toribio SAUNDERS, MD;  Location: [facility] INVASIVE CV LAB;  Service: Cardiovascular;  Laterality: N/A;   SKIN BIOPSY Right 02/22/2016   Procedure: BIOPSY SKIN;  Surgeon: Bobie FORBES Cathlyn JAYSON Nikki, MD;  Location: [facility];  Service: Gynecology;  Laterality: Right;   SQUAMOUS CELL CARCINOMA EXCISION     TEMPORARY PACEMAKER N/A 07/05/2023   Procedure: TEMPORARY PACEMAKER;  Surgeon: Zenaida Morene PARAS, MD;  Location: [facility] INVASIVE CV LAB;  Service: Cardiovascular;  Laterality: N/A;   VENTRICULAR ASSIST DEVICE INSERTION N/A 06/27/2023   Procedure: VENTRICULAR ASSIST DEVICE INSERTION;  Surgeon: Cherrie Toribio SAUNDERS, MD;  Location: [facility] INVASIVE CV LAB;  Service: Cardiovascular;  Laterality: N/A;   WISDOM TOOTH EXTRACTION      Current Medications: Current Meds  Medication Sig   acetaminophen  (TYLENOL ) 500 MG tablet Take 500 mg by mouth every 6 (six) hours as needed for mild pain (pain score 1-3).   ALPRAZolam  (XANAX ) 0.5 MG tablet TAKE 1/2 TO 1 TABLET BY MOUTH TWICE DAILY AS NEEDED (Patient taking differently: Take 0.5 mg by mouth daily as needed for anxiety.)   apixaban  (ELIQUIS ) 5 MG TABS tablet Take 1 tablet (5 mg total) by mouth 2 (two) times daily.   aspirin  EC 81 MG tablet Take 1 tablet (81 mg total) by mouth daily. Swallow whole.   Bempedoic Acid (NEXLETOL) 180 MG TABS Take 1 tablet (180 mg total) by mouth daily.   Bempedoic Acid (NEXLETOL) 180 MG TABS Take 1 tablet (180 mg total) by mouth daily.   ceFAZolin  (ANCEF ) 1 g injection  Inject 1 g into the muscle.   clotrimazole-betamethasone (LOTRISONE) cream Apply 1 Application topically 2 (two) times daily.   levothyroxine  (SYNTHROID ) 50 MCG tablet Take 50 mcg by mouth daily.   magnesium  oxide (MAG-OX) 400 MG tablet Take 400 mg by mouth daily.   pantoprazole  (PROTONIX ) 40 MG tablet Take 1 tablet (40 mg total) by mouth daily.   sertraline  (ZOLOFT ) 100 MG tablet Take 1 tablet (100 mg total) by mouth daily.   spironolactone  (ALDACTONE ) 25 MG tablet Take 1 tablet (25 mg total) by mouth daily.   torsemide  (DEMADEX ) 20 MG tablet Take 1 tablet (20 mg total) by mouth daily.   zolpidem  (AMBIEN ) 5 MG tablet TAKE 1 TABLET BY MOUTH AT BEDTIME AS NEEDED FOR SLEEP     Allergies:   Codeine, Crestor [rosuvastatin], Other, Simvastatin, Egg white (egg protein), Evolocumab , Ezetimibe , Hydrocodone-acetaminophen , Peanut (diagnostic), Shellfish allergy, and Statins   Social History   Socioeconomic History   Marital status: Divorced    Spouse name: Not on file   Number of children: Not on file   Years of education: Not on file   Highest education level: Not on file  Occupational History   Not on file  Tobacco Use   Smoking status: Former    Types: Cigarettes   Smokeless tobacco: Never  Vaping Use   Vaping status: Never Used  Substance and Sexual Activity   Alcohol use: No   Drug use: No   Sexual activity: Yes    Partners: Male    Birth control/protection: Post-menopausal    Comment: occ  Other Topics Concern   Not on file  Social History Narrative   Not on file   Social Drivers of Health   Financial Resource Strain: Not on file  Food Insecurity: No Food Insecurity (07/24/2023)   Hunger Vital Sign    Worried About Running Out of Food in the Last Year: Never true    Ran Out of Food in the Last Year: Never true  Transportation Needs: No Transportation Needs (07/24/2023)   PRAPARE - Administrator, Civil Service (Medical): No    Lack of Transportation  (Non-Medical): No  Physical Activity: Not on file  Stress: Not on file  Social Connections: Socially Integrated (07/24/2023)   Social Connection and Isolation Panel    Frequency of Communication with Friends and Family: Three times a week  Frequency of Social Gatherings with Friends and Family: Twice a week    Attends Religious Services: More than 4 times per year    Active Member of [employer] or Organizations: Yes    Attends Engineer, structural: More than 4 times per year    Marital Status: Married     Family History: The patient's family history includes CAD in her brother and brother; Cholesteatoma in her sister; Depression in her father; Heart attack in her brother, brother, maternal grandmother, maternal uncle, maternal uncle, maternal uncle, maternal uncle, and maternal uncle; Heart disease in her brother; Hyperlipidemia in her sister; Hypertension in her brother; Non-Hodgkin's lymphoma in her brother; Other in her brother; Stroke in her father and mother. ROS:   Please see the history of present illness.    All 14 point review of systems negative except as described per history of present illness.  EKGs/Labs/Other Studies Reviewed:    The following studies were reviewed today:       Image of RCA on prior cardiac From July 2017:   EKG:       Recent Labs: 07/23/2023: B Natriuretic Peptide 137.0 07/25/2023: ALT 13; BUN 9; Creatinine, Ser 0.77; Hemoglobin 11.3; Magnesium  2.0; Platelets 186; Potassium 4.0; Sodium 134  Recent Lipid Panel    Component Value Date/Time   CHOL 235 (H) 05/25/2014 0535   TRIG 352 (H) 05/25/2014 0535   HDL 38 (L) 05/25/2014 0535   CHOLHDL 6.2 05/25/2014 0535   VLDL 70 (H) 05/25/2014 0535   LDLCALC 127 (H) 05/25/2014 0535    Physical Exam:    VS:  BP 128/78   Pulse 88   Ht 5' 3 (1.6 m)   Wt 152 lb 9.6 oz (69.2 kg)   LMP 07/18/2011   SpO2 96%   BMI 27.03 kg/m     Wt Readings from Last 3 Encounters:  10/18/23 152 lb 9.6 oz (69.2  kg)  09/06/23 149 lb 12.8 oz (67.9 kg)  08/24/23 149 lb (67.6 kg)     GENERAL:  Well nourished, well developed in no acute distress NECK: No JVD; No carotid bruits CARDIAC: RRR, S1 and S2 present, no murmurs, no rubs, no gallops CHEST:  Clear to auscultation without rales, wheezing or rhonchi  Extremities: No pitting pedal edema. Pulses bilaterally symmetric with radial 2+ and dorsalis pedis 2+ NEUROLOGIC:  Alert and oriented x 3  Medication Adjustments/Labs and Tests Ordered: Current medicines are reviewed at length with the patient today.  Concerns regarding medicines are outlined above.  No orders of the defined types were placed in this encounter.  Meds ordered this encounter  Medications   Bempedoic Acid (NEXLETOL) 180 MG TABS    Sig: Take 1 tablet (180 mg total) by mouth daily.    Dispense:  90 tablet    Refill:  3   Bempedoic Acid (NEXLETOL) 180 MG TABS    Sig: Take 1 tablet (180 mg total) by mouth daily.    Lot Number?:   [id number]    Expiration Date?:   07/17/2025    Signed, Vicki reddy Raeli Wiens, MD, MPH, [facility]. 10/18/2023 8:14 PM     [facility]

## 2023-10-18 NOTE — Assessment & Plan Note (Addendum)
 Cardiac cath in July 2017 nonobstructive CAD with left dominant circulation and small nondominant RCA. Now s/p NSTEMI, cath 06/27/2023 with nonvisualized RCA.  No obstructive disease in left coronary circulation. Initial RV severe dysfunction on echocardiogram subsequently improved. Normal biventricular function on follow-up echocardiogram 07/23/2023.  Had atypical chest discomfort with normal flat troponins and no significant EKG changes on hospital visit 07/23/2023.  Had 2 atypical symptoms of chest pain in the past week. Functional status is good with day-to-day activities but does get anxious and off there were instances of feeling out of breath with more than mild exertion.  Continue with aspirin  81 mg once daily for now in addition to Eliquis . Currently not on any lipid-lowering agents. Discussed about Nexletol [bempedoic acid] for lipid-lowering and cardiovascular risk reduction. Discussed potential risks with gout flareup [she does not have a history of gout], tendon rupture risk reported in certain populations. She is agreeable to proceed. Will start Nexletol [bempedoic acid].   Given her history informed her and her husband if she does have any episodes of chest pain or pressure discomfort which is not resolving within few minutes of rest should promptly go to the ER and also notify our office. She and her husband both verbalized understanding and are agreeable to do this.  Low threshold to consider repeat cardiac cath versus CT coronary imaging.

## 2023-10-18 NOTE — Assessment & Plan Note (Signed)
 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 No follow-up imaging. Results were apparently reviewed by ER physician with Dr.Cain from vascular surgery and findings were apparently 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. She had been subsequently started on Eliquis  for paroxysmal A-fib and has been tolerating well.  Will request follow-up with vascular surgeon to evaluate if any further follow-up imaging is required.

## 2023-10-18 NOTE — Assessment & Plan Note (Addendum)
 Transient sinus node dysfunction during inpatient stay June 2025 requiring transient transvenous pacemaker. No significant pauses or high-grade conduction abnormalities on outpatient heart monitor June 2025.  Avoid negative chronotropic agents including beta-blockers. During inpatient and was felt not a candidate for permanent pacemaker or as the symptoms resolved.  Will recommend follow-up with electrophysiologist given her ongoing symptoms of occasional dizziness, short but relatively sustained runs of SVT on heart monitor and episodes of accelerated idioventricular rhythm noted on telemetry during inpatient stay and heart monitor, which were felt to be symptomatic.  Will request follow-up with Dr. Kennyth in electrophysiology as he has seen her for multiple visits.

## 2023-10-18 NOTE — Patient Instructions (Signed)
 Medication Instructions:  Your physician has recommended you make the following change in your medication:  Start Nexletol 180 once daily  *If you need a refill on your cardiac medications before your next appointment, please call your pharmacy*  Lab Work: NONE If you have labs (blood work) drawn today and your tests are completely normal, you will receive your results only by: MyChart Message (if you have MyChart) OR A paper copy in the mail If you have any lab test that is abnormal or we need to change your treatment, we will call you to review the results.  Testing/Procedures: NONE  Follow-Up: At [facility], you and your health needs are our priority.  As part of our continuing mission to provide you with exceptional heart care, our providers are all part of one team.  This team includes your primary Cardiologist (physician) and Advanced Practice Providers or APPs (Physician Assistants and Nurse Practitioners) who all work together to provide you with the care you need, when you need it.  Your next appointment:   3 month(s)  Provider:   Alean Kobus, MD    We recommend signing up for the patient portal called MyChart.  Sign up information is provided on this After Visit Summary.  MyChart is used to connect with patients for Virtual Visits (Telemedicine).  Patients are able to view lab/test results, encounter notes, upcoming appointments, etc.  Non-urgent messages can be sent to your provider as well.   To learn more about what you can do with MyChart, go to [email or web address].   Other Instructions

## 2023-10-18 NOTE — Assessment & Plan Note (Addendum)
 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 Recovered LVEF. Last echocardiogram 07/23/2023 normal biventricular function.  Euvolemic and compensated. Recommended to remain on low-salt diet less than 2 g/day. Continue with torsemide  20 mg once daily. Inpatient was started on spironolactone  25 mg once daily.  Tolerating well. Not a candidate for beta-blockers due to sinus node dysfunction. Currently with normalized biventricular function and normal blood pressures and symptoms of postural lightheadedness at times, will hold off on adding ACE inhibitor/ARB/Entresto. Will hold off on SGLT2 inhibitors at this time given multiple medical issues at this time.  Blood work from 08/15/2023 BUN 12, creatinine 0.86.   Will request follow-up echocardiogram for assessing biventricular function now that Vicki [name] has been relatively symptom-free to establish new baseline of her RV function.

## 2023-10-19 ENCOUNTER — Telehealth: Payer: Self-pay

## 2023-10-19 DIAGNOSIS — I50812 Chronic right heart failure: Secondary | ICD-10-CM

## 2023-10-19 DIAGNOSIS — I71019 Dissection of thoracic aorta, unspecified: Secondary | ICD-10-CM

## 2023-10-19 DIAGNOSIS — I779 Disorder of arteries and arterioles, unspecified: Secondary | ICD-10-CM

## 2023-10-19 DIAGNOSIS — I498 Other specified cardiac arrhythmias: Secondary | ICD-10-CM

## 2023-10-19 DIAGNOSIS — I495 Sick sinus syndrome: Secondary | ICD-10-CM

## 2023-10-19 NOTE — Telephone Encounter (Signed)
 Orders placed per Dr. Madireddy's note

## 2023-10-19 NOTE — Telephone Encounter (Signed)
-----   Message from Alean SAUNDERS Madireddy sent at 10/18/2023  8:15 PM EDT ----- Please place following orders for Ms. Graybill seen today at office and I further discussed with her husband over the phone today after her office visit.  - EP referral  (Dr. Kennyth - has seen her during inpatient visit) for sinus node dysfunction and accelerated idioventricular rhythms, symptomatic. - Transthoracic echocardiogram to follow-up on biventricular function, CHF - Vascular surgeon referral to formally evaluate and comment regarding descending aorta ulcerated plaque with intramural hematoma. - Order imaging CTA chest/abdomen/pelvis (with contrast) to assess aorta.  Thank you.

## 2023-10-31 DIAGNOSIS — I252 Old myocardial infarction: Secondary | ICD-10-CM | POA: Diagnosis not present

## 2023-10-31 DIAGNOSIS — E785 Hyperlipidemia, unspecified: Secondary | ICD-10-CM | POA: Diagnosis not present

## 2023-10-31 DIAGNOSIS — I679 Cerebrovascular disease, unspecified: Secondary | ICD-10-CM | POA: Diagnosis not present

## 2023-10-31 DIAGNOSIS — I50810 Right heart failure, unspecified: Secondary | ICD-10-CM | POA: Diagnosis not present

## 2023-10-31 DIAGNOSIS — J40 Bronchitis, not specified as acute or chronic: Secondary | ICD-10-CM | POA: Diagnosis not present

## 2023-10-31 DIAGNOSIS — Z1331 Encounter for screening for depression: Secondary | ICD-10-CM | POA: Diagnosis not present

## 2023-10-31 DIAGNOSIS — E039 Hypothyroidism, unspecified: Secondary | ICD-10-CM | POA: Diagnosis not present

## 2023-10-31 DIAGNOSIS — J0100 Acute maxillary sinusitis, unspecified: Secondary | ICD-10-CM | POA: Diagnosis not present

## 2023-10-31 DIAGNOSIS — M8580 Other specified disorders of bone density and structure, unspecified site: Secondary | ICD-10-CM | POA: Diagnosis not present

## 2023-10-31 DIAGNOSIS — F419 Anxiety disorder, unspecified: Secondary | ICD-10-CM | POA: Diagnosis not present

## 2023-10-31 DIAGNOSIS — I10 Essential (primary) hypertension: Secondary | ICD-10-CM | POA: Diagnosis not present

## 2023-10-31 DIAGNOSIS — Z0000 Encounter for general adult medical examination without abnormal findings: Secondary | ICD-10-CM | POA: Diagnosis not present

## 2023-11-03 ENCOUNTER — Other Ambulatory Visit (HOSPITAL_BASED_OUTPATIENT_CLINIC_OR_DEPARTMENT_OTHER): Payer: Self-pay | Admitting: Family Medicine

## 2023-11-03 DIAGNOSIS — Z13820 Encounter for screening for osteoporosis: Secondary | ICD-10-CM

## 2023-11-13 ENCOUNTER — Ambulatory Visit (INDEPENDENT_AMBULATORY_CARE_PROVIDER_SITE_OTHER)
Admission: RE | Admit: 2023-11-13 | Discharge: 2023-11-13 | Disposition: A | Discharge status: Home / Self Care | Source: Ambulatory Visit | Attending: Family Medicine | Admitting: Family Medicine

## 2023-11-13 DIAGNOSIS — Z780 Asymptomatic menopausal state: Secondary | ICD-10-CM | POA: Diagnosis not present

## 2023-11-13 DIAGNOSIS — Z13820 Encounter for screening for osteoporosis: Secondary | ICD-10-CM

## 2023-11-13 DIAGNOSIS — M8589 Other specified disorders of bone density and structure, multiple sites: Secondary | ICD-10-CM | POA: Diagnosis not present

## 2023-11-14 ENCOUNTER — Ambulatory Visit

## 2023-11-14 ENCOUNTER — Telehealth: Payer: Self-pay

## 2023-11-14 DIAGNOSIS — I50812 Chronic right heart failure: Secondary | ICD-10-CM

## 2023-11-14 LAB — ECHOCARDIOGRAM COMPLETE
Area-P 1/2: 3.98 cm2
S' Lateral: 2.2 cm

## 2023-11-14 NOTE — Telephone Encounter (Signed)
 Pt is requesting a note for safe travel along with a handicap placard, pt is planning on leaving for vacation on Friday. I did let her know there was no guarantee that we would have either completed in that time and that they typically require a 2 week turn around time for forms

## 2023-11-15 NOTE — Telephone Encounter (Signed)
 Recommendations reviewed with pt as per Dr. Madireddy's note.  Pt verbalized understanding and had no additional questions.

## 2023-12-01 ENCOUNTER — Encounter: Payer: Self-pay | Admitting: Cardiology

## 2023-12-01 ENCOUNTER — Ambulatory Visit: Attending: Cardiology | Admitting: Cardiology

## 2023-12-01 VITALS — BP 117/77 | HR 88 | Ht 63.0 in | Wt 150.4 lb

## 2023-12-01 DIAGNOSIS — I480 Paroxysmal atrial fibrillation: Secondary | ICD-10-CM | POA: Diagnosis not present

## 2023-12-01 DIAGNOSIS — D6869 Other thrombophilia: Secondary | ICD-10-CM

## 2023-12-01 DIAGNOSIS — I495 Sick sinus syndrome: Secondary | ICD-10-CM | POA: Diagnosis not present

## 2023-12-01 DIAGNOSIS — I50812 Chronic right heart failure: Secondary | ICD-10-CM

## 2023-12-01 DIAGNOSIS — R001 Bradycardia, unspecified: Secondary | ICD-10-CM | POA: Diagnosis not present

## 2023-12-01 NOTE — Patient Instructions (Signed)
 Medication Instructions:  Your physician recommends that you continue on your current medications as directed. Please refer to the Current Medication list given to you today.  *If you need a refill on your cardiac medications before your next appointment, please call your pharmacy*  Testing/Procedures: Event Monitor  Your physician has recommended that you wear an event monitor. Event monitors are medical devices that record the heart's electrical activity. Doctors most often us  these monitors to diagnose arrhythmias. Arrhythmias are problems with the speed or rhythm of the heartbeat. The monitor is a small, portable device. You can wear one while you do your normal daily activities. This is usually used to diagnose what is causing palpitations/syncope (passing out).  Follow-Up: At [facility], you and your health needs are our priority.  As part of our continuing mission to provide you with exceptional heart care, our providers are all part of one team.  This team includes your primary Cardiologist (physician) and Advanced Practice Providers or APPs (Physician Assistants and Nurse Practitioners) who all work together to provide you with the care you need, when you need it.  Your next appointment:   6-8 weeks  Provider:   Fonda Kitty, MD

## 2023-12-02 NOTE — Progress Notes (Signed)
 Electrophysiology Office Note:   Date:  12/02/2023  ID:  DESIA SABAN, DOB 06-24-1950, MRN [id number]  Primary Cardiologist: Alean SAUNDERS Madireddy, MD Electrophysiologist: Fonda Kitty, MD      History of Present Illness:   Vicki [name] is a 73 y.o. female with h/o chronic diastolic heart failure, coronary artery disease, paroxysmal atrial fibrillation, stroke, hyperlipidemia who is being seen today for hospital discharge follow-up.  Patient had prolonged admission in June of this year, 6/10 - 6/27, for cardiogenic shock, suspected RCA occlusion, RV failure and sinus node dysfunction.  Course complicated by staph epi bacteremia.  EP was consulted for sinus node dysfunction which required temporary pacing but ultimately recovered.  Since discharge, she reports that she is slowly improved.  Functional status improving.  She does continue to get short of breath with significant exertion.  She has occasional palpitations.  She has seen slow heart rates on occasion.  She has occasional dizzy episodes which she is unsure if correlate with any heart rate or rhythm changes.  No syncopal episodes.  Review of systems complete and found to be negative unless listed in HPI.   EP Information / Studies Reviewed:    EKG is ordered today. Personal review as below.  EKG Interpretation Date/Time:  Friday December 01 2023 16:40:33 EST Ventricular Rate:  86 PR Interval:    QRS Duration:  76 QT Interval:  380 QTC Calculation: 454 R Axis:   86  Text Interpretation: Sinus rhythm with low amplitude P waves vs ectopic atrial rhythm Low voltage QRS When compared with ECG of 23-Jul-2023 18:19, P wave amplitude smaller Confirmed by Kitty Fonda [id number]) on 12/02/2023 2:30:11 PM   Echo 11/14/23:   1. Left ventricular ejection fraction, by estimation, is 55 to 60%. Left  ventricular ejection fraction by 3D volume is 59 %. The left ventricle has  normal function. Left ventricular endocardial border not  optimally defined  to evaluate regional wall  motion. Left ventricular diastolic parameters are consistent with Grade I  diastolic dysfunction (impaired relaxation). The average left ventricular  global longitudinal strain is -18.0 %. The global longitudinal strain is  normal.   2. Right ventricular systolic function is normal. The right ventricular  size is normal.   3. The mitral valve is normal in structure. No evidence of mitral valve  regurgitation. No evidence of mitral stenosis.   4. The aortic valve was not well visualized. Aortic valve regurgitation  is not visualized. No aortic stenosis is present.   5. The inferior vena cava is normal in size with greater than 50%  respiratory variability, suggesting right atrial pressure of 3 mmHg.   Physical Exam:   VS:  BP 117/77   Pulse 88   Ht 5' 3 (1.6 m)   Wt 150 lb 6.4 oz (68.2 kg)   LMP 07/18/2011   SpO2 96%   BMI 26.64 kg/m    Wt Readings from Last 3 Encounters:  12/01/23 150 lb 6.4 oz (68.2 kg)  10/18/23 152 lb 9.6 oz (69.2 kg)  09/06/23 149 lb 12.8 oz (67.9 kg)     General: Well developed, in no acute distress.  Neck: No JVD.  Cardiac: Normal rate, regular rhythm.  Resp: Normal work of breathing.  Ext: No edema.  Neuro: No gross focal deficits.  Psych: Normal affect.   ASSESSMENT AND PLAN:   Patient had prolonged hospitalization this summer for suspected RV infarct with RV failure and sinus node dysfunction.  Permanent pacemaker was deferred given  presence of bacteremia.  She required temporary pacing for period in the hospital but ultimately recovered.  She was discharged with a live Zio monitor which showed sinus rates ranging from 46 to 223 bpm.  #. Bradycardia: In the setting of her acute illness patient developed inappropriate bradycardia.  Temporary wire was placed.  After stopping dobutamine  she had increase in temporary usage.  Bradycardia appears to be mainly sinus node dysfunction.  With time it appears that  sinus node has recovered.  Today she continues to conduct one-to-one at normal rates.  Overall, appears to have recovered.   #. Sinus node dysfunction:  - Repeat 2-week monitor.   #. Paroxysmal atrial fibrillation:  #. Hypercoagulable state due to AF: - Continue Eliquis  5 mg twice daily.  -Avoiding rate controlling or AV nodal blocking agents due to the above problems.   Follow up with Dr. Kennyth 6 weeks to review results of ZIO monitor.   Signed, Fonda Kennyth, MD

## 2023-12-04 ENCOUNTER — Ambulatory Visit: Attending: Cardiology

## 2023-12-04 DIAGNOSIS — R001 Bradycardia, unspecified: Secondary | ICD-10-CM

## 2023-12-04 DIAGNOSIS — I495 Sick sinus syndrome: Secondary | ICD-10-CM

## 2023-12-04 NOTE — Progress Notes (Unsigned)
 Enrolled for [employer] to mail a ZIO XT long term holter monitor to the patients address on file.

## 2023-12-14 ENCOUNTER — Other Ambulatory Visit: Payer: Self-pay | Admitting: Psychiatry

## 2023-12-14 DIAGNOSIS — F4310 Post-traumatic stress disorder, unspecified: Secondary | ICD-10-CM

## 2023-12-19 ENCOUNTER — Telehealth (HOSPITAL_COMMUNITY): Payer: Self-pay

## 2023-12-19 NOTE — Telephone Encounter (Signed)
No response from pt in regards to cardiac rehab. Closed referral 

## 2024-01-07 DIAGNOSIS — I495 Sick sinus syndrome: Secondary | ICD-10-CM | POA: Diagnosis not present

## 2024-01-07 DIAGNOSIS — R001 Bradycardia, unspecified: Secondary | ICD-10-CM | POA: Diagnosis not present

## 2024-01-09 ENCOUNTER — Encounter (HOSPITAL_BASED_OUTPATIENT_CLINIC_OR_DEPARTMENT_OTHER): Payer: Self-pay

## 2024-01-09 ENCOUNTER — Ambulatory Visit (HOSPITAL_BASED_OUTPATIENT_CLINIC_OR_DEPARTMENT_OTHER)
Admission: RE | Admit: 2024-01-09 | Discharge: 2024-01-09 | Disposition: A | Discharge status: Home / Self Care | Source: Ambulatory Visit | Attending: Family Medicine | Admitting: Family Medicine

## 2024-01-09 ENCOUNTER — Other Ambulatory Visit (HOSPITAL_BASED_OUTPATIENT_CLINIC_OR_DEPARTMENT_OTHER): Payer: Self-pay

## 2024-01-09 ENCOUNTER — Ambulatory Visit (HOSPITAL_BASED_OUTPATIENT_CLINIC_OR_DEPARTMENT_OTHER): Admit: 2024-01-09 | Discharge: 2024-01-09 | Disposition: A | Discharge status: Home / Self Care | Admitting: Radiology

## 2024-01-09 ENCOUNTER — Ambulatory Visit (HOSPITAL_BASED_OUTPATIENT_CLINIC_OR_DEPARTMENT_OTHER): Payer: Self-pay | Admitting: Family Medicine

## 2024-01-09 VITALS — BP 125/84 | HR 87 | Temp 98.2°F | Resp 18

## 2024-01-09 DIAGNOSIS — J189 Pneumonia, unspecified organism: Secondary | ICD-10-CM

## 2024-01-09 DIAGNOSIS — R051 Acute cough: Secondary | ICD-10-CM | POA: Diagnosis not present

## 2024-01-09 DIAGNOSIS — R059 Cough, unspecified: Secondary | ICD-10-CM

## 2024-01-09 LAB — POC COVID19/FLU A&B COMBO
Covid Antigen, POC: NEGATIVE
Influenza A Antigen, POC: NEGATIVE
Influenza B Antigen, POC: NEGATIVE

## 2024-01-09 MED ORDER — ALBUTEROL SULFATE HFA 108 (90 BASE) MCG/ACT IN AERS
2.0000 | INHALATION_SPRAY | RESPIRATORY_TRACT | 0 refills | Status: AC | PRN
  Filled 2024-01-09: qty 6.7, 20d supply, fill #0

## 2024-01-09 MED ORDER — DOXYCYCLINE HYCLATE 100 MG PO CAPS
100.0000 mg | ORAL_CAPSULE | Freq: Two times a day (BID) | ORAL | 0 refills | Status: AC
  Filled 2024-01-09: qty 20, 10d supply, fill #0

## 2024-01-09 MED ORDER — CEFTRIAXONE SODIUM 1 G IJ SOLR
1.0000 g | Freq: Once | INTRAMUSCULAR | Status: AC
  Administered 2024-01-09: 1 g via INTRAMUSCULAR

## 2024-01-09 MED ORDER — PROMETHAZINE-DM 6.25-15 MG/5ML PO SYRP
5.0000 mL | ORAL_SOLUTION | Freq: Four times a day (QID) | ORAL | 0 refills | Status: DC | PRN
  Filled 2024-01-09: qty 118, 6d supply, fill #0

## 2024-01-09 MED ORDER — COMPACT SPACE CHAMBER DEVI
0 refills | Status: AC
  Filled 2024-01-09: qty 1, 1d supply, fill #0

## 2024-01-09 MED ORDER — TRIAMCINOLONE ACETONIDE 40 MG/ML IJ SUSP
40.0000 mg | Freq: Once | INTRAMUSCULAR | Status: AC
  Administered 2024-01-09: 40 mg via INTRAMUSCULAR

## 2024-01-09 NOTE — ED Provider Notes (Addendum)
 " [facility]    CSN: [id number] Arrival date & time: 01/09/24  1259      History   Chief Complaint Chief Complaint  Patient presents with   Cough    HPI FRAIDA VELDMAN is a 73 y.o. female.   73 year old female who got off of a cruise a week ago and immediately had nausea vomiting and diarrhea on 01/01/2024.  That resolved after 24 hours but then she developed runny nose, cough, congestion, sore throat, headache, sinus pressure and pain and a deep cough.  The other 2 friends she was traveling with have been diagnosed with pneumonia in the last 2 days.  She has lost her voice.  She has tried Mucinex, Alka-Seltzer plus, albuterol  inhaler and Chloraseptic spray without relief of her congestion and throat symptoms.  She is doing sinus rinses and that gives her some relief of the sinus pressure.   Cough Associated symptoms: rhinorrhea and sore throat   Associated symptoms: no chest pain, no chills, no ear pain, no fever, no rash and no shortness of breath     Past Medical History:  Diagnosis Date   Abnormal nuclear stress test    Acute diastolic CHF (congestive heart failure) (HCC) 07/14/2023   AKI (acute kidney injury) 07/23/2023   Alcohol abuse 05/24/2014   Alcohol abuse with intoxication 05/24/2014   Alcohol intoxication 05/2014   ED note    Anxiety    Arthritis    Boils    Cardiogenic shock (HCC) 06/27/2023   Chest pain 07/23/2015   Chronic back pain    Coagulase negative Staphylococcus bacteremia 07/06/2023   Community acquired pneumonia 01/05/2016   Depression    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   Dysuria 08/24/2023   Elevated blood-pressure reading without diagnosis of hypertension 09/05/2023   Elevated TSH 09/05/2023   Fibromyalgia    GERD (gastroesophageal reflux disease)    Heart attack (HCC)    Hepatic cyst 01/06/2016   History of colonic polyps 09/05/2023   History of CVA (cerebrovascular accident)     IMO SNOMED Dx Update Oct 2024     Hyperlipidemia    Hypertension    Hypertriglyceridemia 10/07/2015   Hyponatremia 05/24/2014   Insomnia    Left-sided weakness 05/24/2014   Leg cramps    Medication management 08/24/2023   Migraines    with aura   Mini stroke 05/2014   with paralysis for 1 hr   Non-ST elevation (NSTEMI) myocardial infarction (HCC) 07/14/2023   Osteopenia    Palpitations 07/23/2015   Paroxysmal atrial fibrillation (HCC) 07/14/2023   Physical deconditioning 07/14/2023   PONV (postoperative nausea and vomiting)    severe   Postmenopausal bleeding 12/2015   PTSD (post-traumatic stress disorder)    Right arm numbness 05/24/2014   S/P epidural steroid injection    S/P lumbar fusion 12/31/2018   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, passed out, 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 followed up with eye doctor no further issues   Shortness of breath 07/25/2023   Sinus node dysfunction (HCC) 07/11/2023   Spinal stenosis    Strep throat 01/05/2016   Stroke (HCC)    3 strokes, no residual   Syncope and collapse 05/24/2014   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    Patient Active Problem List   Diagnosis Date Noted   CAD (coronary artery disease) 10/18/2023   Accelerated idioventricular rhythm 10/18/2023   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   Elevated blood-pressure reading without diagnosis of hypertension 09/05/2023   Elevated TSH 09/05/2023   History of colonic polyps 09/05/2023   Stroke (HCC)    Spinal stenosis    Seizures (HCC)    S/P epidural steroid injection    PONV (postoperative nausea and vomiting)    Osteopenia    Migraines    Leg cramps    Insomnia    GERD (gastroesophageal reflux disease)    Fibromyalgia    Chronic back pain    Boils    Arthritis    Anxiety    Medication management 08/24/2023   Dysuria 08/24/2023   Shortness of breath  07/25/2023   AKI (acute kidney injury) 07/23/2023   Non-ST elevation (NSTEMI) myocardial infarction (HCC) 07/14/2023   Chronic CHF (congestive heart failure) (HCC) 07/14/2023   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   Paroxysmal atrial fibrillation (HCC) 07/14/2023   Physical deconditioning 07/14/2023   Sinus node dysfunction (HCC) 07/11/2023   Coagulase negative Staphylococcus bacteremia 07/06/2023   S/P lumbar fusion 12/31/2018   PTSD (post-traumatic stress disorder) 11/10/2017   Hepatic cyst 01/06/2016   Strep throat 01/05/2016   Community acquired pneumonia 01/05/2016   Postmenopausal bleeding 12/2015   Hyperlipidemia 10/07/2015   Abnormal nuclear stress test    Chest pain 07/23/2015   Palpitations 07/23/2015   Depression    Left-sided weakness 05/24/2014   Hyponatremia 05/24/2014   Right arm numbness 05/24/2014   Alcohol abuse 05/24/2014   Alcohol abuse with intoxication 05/24/2014   Syncope and collapse 05/24/2014   History of CVA (cerebrovascular accident)    Hypertension    Mini stroke 05/2014   Alcohol intoxication 05/2014    Past Surgical History:  Procedure Laterality Date   BREAST ENHANCEMENT SURGERY     CARDIAC CATHETERIZATION N/A 08/12/2015   Procedure: Left Heart Cath and Coronary Angiography;  Surgeon: Candyce GORMAN Reek, MD;  Location: [facility] INVASIVE CV LAB;  Service: Cardiovascular;  Laterality: N/A;   COLONOSCOPY     DILATATION & CURETTAGE/HYSTEROSCOPY WITH MYOSURE N/A 02/22/2016   Procedure: DILATATION & CURETTAGE/HYSTEROSCOPY WITH MYOSURE;  Surgeon: Bobie FORBES Cathlyn JAYSON Nikki, MD;  Location: [facility];  Service: Gynecology;  Laterality: N/A;   FACIAL COSMETIC SURGERY     HAND SURGERY     nerve & tendon repair   LIPOSUCTION     LUMBAR EPIDURAL INJECTION  06/16/2015   RIGHT/LEFT HEART CATH AND CORONARY ANGIOGRAPHY N/A 06/27/2023   Procedure: RIGHT/LEFT HEART CATH AND CORONARY ANGIOGRAPHY;  Surgeon: Cherrie Toribio SAUNDERS, MD;  Location: [facility] INVASIVE CV  LAB;  Service: Cardiovascular;  Laterality: N/A;   SKIN BIOPSY Right 02/22/2016   Procedure: BIOPSY SKIN;  Surgeon: Bobie FORBES Cathlyn JAYSON Nikki, MD;  Location: [facility];  Service: Gynecology;  Laterality: Right;   SQUAMOUS CELL CARCINOMA EXCISION     TEMPORARY PACEMAKER N/A 07/05/2023   Procedure: TEMPORARY PACEMAKER;  Surgeon: Zenaida Morene PARAS, MD;  Location: [facility] INVASIVE CV LAB;  Service: Cardiovascular;  Laterality: N/A;   VENTRICULAR ASSIST DEVICE INSERTION N/A 06/27/2023   Procedure: VENTRICULAR ASSIST DEVICE INSERTION;  Surgeon: Cherrie Toribio SAUNDERS, MD;  Location: [facility] INVASIVE CV LAB;  Service: Cardiovascular;  Laterality: N/A;   WISDOM TOOTH EXTRACTION      OB History     Gravida  3   Para  3   Term  3   Preterm      AB      Living  3  SAB      IAB      Ectopic      Multiple      Live Births  3            Home Medications    Prior to Admission medications  Medication Sig Start Date End Date Taking? Authorizing Provider  albuterol  (VENTOLIN  HFA) 108 (90 Base) MCG/ACT inhaler Inhale 2 puffs into the lungs every 4 (four) hours as needed for wheezing or shortness of breath. 01/09/24  Yes Ival Domino, FNP  doxycycline  (VIBRAMYCIN ) 100 MG capsule Take 1 capsule (100 mg total) by mouth 2 (two) times daily for 10 days. 01/09/24 01/19/24 Yes Ival Domino, FNP  promethazine -dextromethorphan (PROMETHAZINE -DM) 6.25-15 MG/5ML syrup Take 5 mLs by mouth 4 (four) times daily as needed for cough. Do not use and drive - May make drowsy. 01/09/24  Yes Ival Domino, FNP  Spacer/Aero-Holding Chambers (COMPACT SPACE CHAMBER) DEVI Use with the albuterol  inhaler 01/09/24  Yes Ival Domino, FNP  acetaminophen  (TYLENOL ) 500 MG tablet Take 500 mg by mouth every 6 (six) hours as needed for mild pain (pain score 1-3).    [provider]  ALPRAZolam  (XANAX ) 0.5 MG tablet TAKE 1/2 TO 1 TABLET BY MOUTH TWICE DAILY AS NEEDED Patient taking differently: Take 0.5 mg by mouth daily as  needed for anxiety. 04/25/22   Cottle, Lorene KANDICE Raddle., MD  apixaban  (ELIQUIS ) 5 MG TABS tablet Take 1 tablet (5 mg total) by mouth 2 (two) times daily. 07/14/23   Goodrich, Callie E, PA-C  aspirin  EC 81 MG tablet Take 1 tablet (81 mg total) by mouth daily. Swallow whole. 07/14/23   Goodrich, Callie E, PA-C  Bempedoic Acid  (NEXLETOL ) 180 MG TABS Take 1 tablet (180 mg total) by mouth daily. 10/18/23   Madireddy, Alean SAUNDERS, MD  Bempedoic Acid  (NEXLETOL ) 180 MG TABS Take 1 tablet (180 mg total) by mouth daily. 10/18/23   Madireddy, Alean SAUNDERS, MD  ceFAZolin  (ANCEF ) 1 g injection Inject 1 g into the muscle. 07/14/23   [provider]  clotrimazole-betamethasone (LOTRISONE) cream Apply 1 Application topically 2 (two) times daily. 07/27/23   [provider]  levothyroxine  (SYNTHROID ) 50 MCG tablet Take 50 mcg by mouth daily. 01/02/23   [provider]  magnesium  oxide (MAG-OX) 400 MG tablet Take 400 mg by mouth daily.    [provider]  pantoprazole  (PROTONIX ) 40 MG tablet Take 1 tablet (40 mg total) by mouth daily. 07/25/23   Danton Reyes DASEN, MD  spironolactone  (ALDACTONE ) 25 MG tablet Take 1 tablet (25 mg total) by mouth daily. 07/14/23   Goodrich, Callie E, PA-C  torsemide  (DEMADEX ) 20 MG tablet Take 1 tablet (20 mg total) by mouth daily. 07/14/23   Goodrich, Callie E, PA-C  zolpidem  (AMBIEN ) 5 MG tablet TAKE 1 TABLET BY MOUTH AT BEDTIME AS NEEDED FOR SLEEP 08/28/23   Cottle, Lorene KANDICE Raddle., MD    Family History Family History  Problem Relation Age of Onset   Stroke Father    Depression Father    Stroke Mother    Hypertension Brother    Heart disease Brother        stint...PACER   Heart attack Brother    CAD Brother    Heart attack Maternal Uncle    Heart attack Maternal Grandmother    Heart attack Brother    Non-Hodgkin's lymphoma Brother    CAD Brother    Heart attack Maternal Uncle    Heart attack Maternal Uncle  Heart attack Maternal Uncle    Heart attack  Maternal Uncle    Cholesteatoma Sister    Hyperlipidemia Sister    Other Brother        SUICIDE 05/2016    Social History Social History[1]   Allergies   Codeine, Other, Rosuvastatin, Simvastatin, Egg protein (egg white), Evolocumab , Ezetimibe , Hydrocodone-acetaminophen , Peanut (diagnostic), Shellfish allergy, and Statins   Review of Systems Review of Systems  Constitutional:  Negative for chills and fever.  HENT:  Positive for congestion, postnasal drip, rhinorrhea, sinus pressure, sinus pain, sore throat and voice change. Negative for ear pain.   Eyes:  Negative for pain and visual disturbance.  Respiratory:  Positive for cough. Negative for shortness of breath.   Cardiovascular:  Negative for chest pain and palpitations.  Gastrointestinal:  Negative for abdominal pain, constipation, diarrhea, nausea and vomiting.  Genitourinary:  Negative for dysuria and hematuria.  Musculoskeletal:  Positive for arthralgias. Negative for back pain.  Skin:  Negative for color change and rash.  Neurological:  Negative for seizures and syncope.  All other systems reviewed and are negative.    Physical Exam Triage Vital Signs ED Triage Vitals [01/09/24 1317]  Encounter Vitals Group     BP 125/84     Girls Systolic BP Percentile      Girls Diastolic BP Percentile      Boys Systolic BP Percentile      Boys Diastolic BP Percentile      Pulse Rate 87     Resp 18     Temp 98.2 F (36.8 C)     Temp Source Oral     SpO2 95 %     Weight      Height      Head Circumference      Peak Flow      Pain Score      Pain Loc      Pain Education      Exclude from Growth Chart    No data found.  Updated Vital Signs BP 125/84 (BP Location: Right Arm)   Pulse 87   Temp 98.2 F (36.8 C) (Oral)   Resp 18   LMP 07/18/2011   SpO2 95%   Visual Acuity Right Eye Distance:   Left Eye Distance:   Bilateral Distance:    Right Eye Near:   Left Eye Near:    Bilateral Near:     Physical  Exam Vitals and nursing note reviewed.  Constitutional:      General: She is not in acute distress.    Appearance: She is well-developed. She is ill-appearing. She is not toxic-appearing or diaphoretic.  HENT:     Head: Normocephalic and atraumatic.     Right Ear: Hearing, tympanic membrane, ear canal and external ear normal.     Left Ear: Hearing, tympanic membrane, ear canal and external ear normal.     Nose: Mucosal edema, congestion and rhinorrhea present. Rhinorrhea is clear.     Right Sinus: Maxillary sinus tenderness and frontal sinus tenderness present.     Left Sinus: Maxillary sinus tenderness and frontal sinus tenderness present.     Mouth/Throat:     Lips: Pink.     Mouth: Mucous membranes are moist.     Pharynx: Uvula midline. Posterior oropharyngeal erythema present. No oropharyngeal exudate.     Tonsils: No tonsillar exudate.     Comments: Voice is very hoarse Eyes:     Conjunctiva/sclera: Conjunctivae normal.     Pupils: Pupils  are equal, round, and reactive to light.  Cardiovascular:     Rate and Rhythm: Normal rate and regular rhythm.     Heart sounds: S1 normal and S2 normal. No murmur heard. Pulmonary:     Effort: Pulmonary effort is normal. No respiratory distress.     Breath sounds: Examination of the right-upper field reveals wheezing. Examination of the left-upper field reveals wheezing. Examination of the right-middle field reveals wheezing. Examination of the left-middle field reveals wheezing. Examination of the right-lower field reveals decreased breath sounds and wheezing. Examination of the left-lower field reveals decreased breath sounds and wheezing. Decreased breath sounds and wheezing present. No rhonchi or rales.     Comments: Oxygen  saturation is 95% on room air.  But the patient has inspiratory wheezes throughout that are moderate. Abdominal:     General: Bowel sounds are normal.     Palpations: Abdomen is soft.     Tenderness: There is no abdominal  tenderness.  Musculoskeletal:        General: No swelling.     Cervical back: Neck supple.  Lymphadenopathy:     Head:     Right side of head: No submental, submandibular, tonsillar, preauricular or posterior auricular adenopathy.     Left side of head: No submental, submandibular, tonsillar, preauricular or posterior auricular adenopathy.     Cervical: No cervical adenopathy.     Right cervical: No superficial cervical adenopathy.    Left cervical: No superficial cervical adenopathy.  Skin:    General: Skin is warm and dry.     Capillary Refill: Capillary refill takes less than 2 seconds.     Findings: No rash.  Neurological:     Mental Status: She is alert and oriented to person, place, and time.  Psychiatric:        Mood and Affect: Mood normal.      UC Treatments / Results  Labs (all labs ordered are listed, but only abnormal results are displayed) Labs Reviewed  POC COVID19/FLU A&B COMBO - Normal    EKG   Radiology No results found.  Procedures Procedures (including critical care time)  Medications Ordered in UC Medications  cefTRIAXone  (ROCEPHIN ) injection 1 g (1 g Intramuscular Given 01/09/24 1356)  triamcinolone  acetonide (KENALOG -40) injection 40 mg (40 mg Intramuscular Given 01/09/24 1357)    Initial Impression / Assessment and Plan / UC Course  I have reviewed the triage vital signs and the nursing notes.  Pertinent labs & imaging results that were available during my care of the patient were reviewed by me and considered in my medical decision making (see chart for details).  Plan of Care (see discharge instructions for additional patient precautions and education): Probable community-acquired pneumonia with cough:  Negative for influenza and COVID.   Chest x-ray is hazy and I believe it is signs of early pneumonia.  There is no consolidation so it may be read as negative and the patient has been updated about that.  Given her recent history and exam  findings, I believe she has pneumonia.   Ceftriaxone  at 1000 mg now.   Kenalog  40 mg now.   Patient was held for 15 minutes after her shots to be sure she tolerated them well and she did. Get plenty of fluids and rest.   Doxycycline  100 mg twice daily for 10 days.   Albuterol  inhaler with spacer, 2 puffs, every 4 hours if needed for wheezing.   Promethazine  DM, 5 mL, every 6 hours if needed for  cough. Make an appointment with primary care to recheck in 2 to 3 weeks or sooner if needed.   Follow-up here as needed.  I reviewed the plan of care with the patient and/or the patient's guardian.  The patient and/or guardian had time to ask questions and acknowledged that the questions were answered.   I spent 30 minutes on patient care, including face to face time and care planning/patient management.  Additional time spent in collection of for history, workup, independent review of her x-ray prior to radiology review and discussion with her of the plan of care and monitoring after her injections. Final Clinical Impressions(s) / UC Diagnoses   Final diagnoses:  Acute cough  Community acquired pneumonia, unspecified laterality     Discharge Instructions      Probable community-acquired pneumonia with cough: Negative for influenza and COVID.  Chest x-ray is hazy and I believe it is signs of early pneumonia.  There is no consolidation so it may be read as negative and the patient has been updated about that.  Given her recent history and exam findings, I believe she has pneumonia.  Ceftriaxone  at 1000 mg now.  Kenalog  40 mg now.  Get plenty of fluids and rest.  Doxycycline  100 mg twice daily for 10 days.  Albuterol  inhaler with spacer, 2 puffs, every 4 hours if needed for wheezing.  Promethazine  DM, 5 mL, every 6 hours if needed for cough.  Make an appointment with primary care to recheck in 2 to 3 weeks or sooner if needed.  Follow-up here as needed.     ED Prescriptions     Medication Sig  Dispense Auth. Provider   doxycycline  (VIBRAMYCIN ) 100 MG capsule Take 1 capsule (100 mg total) by mouth 2 (two) times daily for 10 days. 20 capsule Ival Domino, FNP   promethazine -dextromethorphan (PROMETHAZINE -DM) 6.25-15 MG/5ML syrup Take 5 mLs by mouth 4 (four) times daily as needed for cough. Do not use and drive - May make drowsy. 118 mL Ival Domino, FNP   albuterol  (VENTOLIN  HFA) 108 (90 Base) MCG/ACT inhaler Inhale 2 puffs into the lungs every 4 (four) hours as needed for wheezing or shortness of breath. 6.7 g Ival Domino, FNP   Spacer/Aero-Holding Chambers (COMPACT SPACE CHAMBER) DEVI Use with the albuterol  inhaler 1 each Ival Domino, FNP      PDMP not reviewed this encounter.    Ival Domino, FNP 01/09/24 1411     [1]  Social History Tobacco Use   Smoking status: Former    Types: Cigarettes   Smokeless tobacco: Never  Vaping Use   Vaping status: Never Used  Substance Use Topics   Alcohol use: No   Drug use: No     Ival Domino, FNP 01/09/24 1415  "

## 2024-01-09 NOTE — Discharge Instructions (Addendum)
 Probable community-acquired pneumonia with cough: Negative for influenza and COVID.  Chest x-ray is hazy and I believe it is signs of early pneumonia.  There is no consolidation so it may be read as negative and the patient has been updated about that.  Given her recent history and exam findings, I believe she has pneumonia.  Ceftriaxone  at 1000 mg now.  Kenalog  40 mg now.  Get plenty of fluids and rest.  Doxycycline  100 mg twice daily for 10 days.  Albuterol  inhaler with spacer, 2 puffs, every 4 hours if needed for wheezing.  Promethazine  DM, 5 mL, every 6 hours if needed for cough.  Make an appointment with primary care to recheck in 2 to 3 weeks or sooner if needed.  Follow-up here as needed.

## 2024-01-09 NOTE — ED Triage Notes (Signed)
 Pt reports deep cough chest congestion, headache, sore throat x 7 days. Mucinex, Alka Seltzer Plus, albuterol  inhaler, Chloraseptic spray and sinus wash gives  slightly relief.

## 2024-01-09 NOTE — Progress Notes (Signed)
 Chest x-ray is negative for any acute abnormalities, no pneumonia.  Based on patient exam findings and history, I still feel like she has pneumonia and encouraged her to complete her antibiotics.  I was able to update her personally

## 2024-01-15 ENCOUNTER — Other Ambulatory Visit: Payer: Self-pay | Admitting: Psychiatry

## 2024-01-15 DIAGNOSIS — F4310 Post-traumatic stress disorder, unspecified: Secondary | ICD-10-CM

## 2024-01-26 ENCOUNTER — Ambulatory Visit

## 2024-01-26 VITALS — BP 112/68 | HR 82 | Ht 62.0 in | Wt 144.5 lb

## 2024-01-26 DIAGNOSIS — I495 Sick sinus syndrome: Secondary | ICD-10-CM

## 2024-01-26 DIAGNOSIS — I480 Paroxysmal atrial fibrillation: Secondary | ICD-10-CM

## 2024-01-26 DIAGNOSIS — I2510 Atherosclerotic heart disease of native coronary artery without angina pectoris: Secondary | ICD-10-CM | POA: Diagnosis not present

## 2024-01-26 DIAGNOSIS — I10 Essential (primary) hypertension: Secondary | ICD-10-CM | POA: Diagnosis not present

## 2024-01-26 DIAGNOSIS — I50812 Chronic right heart failure: Secondary | ICD-10-CM | POA: Diagnosis not present

## 2024-01-26 MED ORDER — TORSEMIDE 20 MG PO TABS: 20.0000 mg | ORAL_TABLET | ORAL | 3 refills | Status: AC

## 2024-01-26 NOTE — Assessment & Plan Note (Signed)
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 Recovered LVEF. Updated echocardiogram 11/14/2023 normal biventricular function with LVEF 55 to 60%.  Grade 1 diastolic dysfunction.  Euvolemic and compensated. Continue low-salt diet less than 2 g/day. Appears without significant pedal edema. Continue torsemide  lower the dose to 20 mg every other day.  Not a candidate for beta-blockers due to sinus node dysfunction while acutely ill. Recent heart monitor 14-day study November 2025 reassuring.  With recovered biventricular function, soft blood pressures hold off on adding any further medications such as ACE inhibitors, SGLT2 inhibitors.  Remains on spironolactone  25 mg once daily.  "

## 2024-01-26 NOTE — Assessment & Plan Note (Signed)
]   Well-controlled on current dose of spironolactone .

## 2024-01-26 NOTE — Progress Notes (Signed)
 "  Cardiology Consultation:    Date:  01/26/2024   ID:  Vicki [name], DOB December 23, 1950, MRN [id number]  PCP:  Vicki Pellet, MD  Cardiologist:  Vicki SAUNDERS Latrese Carolan, MD   Referring MD: Vicki Pellet, MD   No chief complaint on file.    ASSESSMENT AND PLAN:   Vicki [name] 74 year old woman with history of CAD nonobstructive cardiac cath previously in 2017 now s/p NSTEMI-RV infarct with nonvisualized RCA on cardiac cath 06/27/2023, complicated by cardiogenic shock requiring mechanical circulatory support with RV Impella, subsequently normalized LV and RV function [on last echocardiogram 07/23/2023], complicated inpatient stay with new diagnosis of paroxysmal atrial fibrillation/flutter and tachybradycardia secondary to transient sinus node dysfunction requiring temporary transvenous pacemaker, outpatient heart monitor 14-day June 2025 noted 2.2% supraventricular ectopy burden and multiple short runs of SVT [with longest episode lasting 51 seconds], 1 instance of accelerated idioventricular rhythm, similar accelerated idioventricular rhythms were reported frequently on subsequent inpatient stay 07/23/2023, heart failure with preserved EF, hyperlipidemia [intolerance to statins, Zetia , Repatha ], small ulcerated plaque with 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   Problem List Items Addressed This Visit       Cardiovascular and Mediastinum   Hypertension   ] Well-controlled on current dose of spironolactone .      Relevant Medications   torsemide  (DEMADEX ) 20 MG tablet   Sinus node dysfunction (HCC)   Transient during her acute inpatient stay in June. No significant abnormalities or AV blocks on follow-up heart monitor studies, most recent December 06 2512-day study.  Has pending follow-up visit with Dr. Kennyth on July 29. She is awaiting  final recommendations with regards to release for driving and air travel. I anticipate she would be cleared for driving and travel at follow-up visit.      Relevant Medications   torsemide  (DEMADEX ) 20 MG tablet   Chronic CHF (congestive heart failure) (HCC)    recent RV infarct June 2025, requiring mechanical support with RV Impella. Recovered LVEF. Updated echocardiogram 11/14/2023 normal biventricular function with LVEF 55 to 60%.  Grade 1 diastolic dysfunction.  Euvolemic and compensated. Continue low-salt diet less than 2 g/day. Appears without significant pedal edema. Continue torsemide  lower the dose to 20 mg every other day.  Not a candidate for beta-blockers due to sinus node dysfunction while acutely ill. Recent heart monitor 14-day study November 2025 reassuring.  With recovered biventricular function, soft blood pressures hold off on adding any further medications such as ACE inhibitors, SGLT2 inhibitors.  Remains on spironolactone  25 mg once daily.       Relevant Medications   torsemide  (DEMADEX ) 20 MG tablet   Paroxysmal atrial fibrillation (HCC)   Diagnosed during inpatient stay June 2025. CHA2DS2-VASc score elevated.  No significant A-fib burden on 14-day Zio patch from 12/04/2023.  Continue Eliquis  5 mg twice daily.        Relevant Medications   torsemide  (DEMADEX ) 20 MG tablet   CAD (coronary artery disease) - Primary   Cardiac cath in July 2017 nonobstructive CAD with left dominant circulation and small nondominant RCA. Now s/p NSTEMI, cath 06/27/2023 with nonvisualized RCA.  No obstructive disease in left coronary circulation. Initial RV severe dysfunction on echocardiogram subsequently improved. Normal biventricular function on follow-up echocardiogram 07/23/2023.   Had atypical chest discomfort with normal flat troponins and no significant EKG changes on hospital visit 07/23/2023.[name]  She has been doing well since. At today's follow-up visit mentions  functional status is significantly improved.  Continue with aspirin  81 mg once daily Continue Eliquis . Continue Nexletol , tolerating well.       Relevant Medications   torsemide  (DEMADEX ) 20 MG tablet   Return for follow-up visit tentatively in 4 months.   History of Present Illness:    Vicki [name] is a 74 y.o. female who is being seen today for follow-up visit. PCP Vicki Pellet, MD. Last visit with me in the office was 10/18/2023.  Lives with her husband at home.  Here for the visit today by herself.  She is not currently driving and having her husband drive her until further clearance from Dr. Kennyth at her upcoming appointment scheduled for January 29  Has a history of CAD nonobstructive cardiac cath previously in 2017 now s/p NSTEMI-RV infarct with nonvisualized RCA on cardiac cath 06/27/2023, complicated by cardiogenic shock requiring mechanical circulatory support with RV Impella, subsequently normalized LV and RV function [on last echocardiogram 07/23/2023], complicated inpatient stay with new diagnosis of paroxysmal atrial fibrillation/flutter and tachybradycardia secondary to transient sinus node dysfunction requiring temporary transvenous pacemaker, outpatient heart monitor 14-day June 2025 noted 2.2% supraventricular ectopy burden and multiple short runs of SVT [with longest episode lasting 51 seconds], 1 instance of accelerated idioventricular rhythm, similar accelerated idioventricular rhythms were reported frequently on subsequent inpatient stay 07/23/2023, heart failure with preserved EF, hyperlipidemia [intolerance to statins, Zetia , Repatha ], small ulcerated plaque with 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  Repeat echocardiogram 11/14/2023 notes normal biventricular function with LVEF 55 to 60%, grade 1 diastolic  dysfunction, Heart monitor 14-day study from 12/04/2023 notes no evidence of atrial fibrillation, short runs of SVT with the longest episode lasting 13 beats.  Occasional supraventricular ectopy burden 3%.  No sustained arrhythmias average heart rate 81/min [ranging from 61 to 174 bpm].  Had follow-up visit with Dr. Kennyth 12/01/2023 in EP and sinus node dysfunction likely related to her acute illness during inpatient stay, follow-up heart monitor was reassuring. Has pending visit with Dr. Kennyth on January 29.  Mentions overall she has been doing well. Denies any chest pain, shortness of breath, orthopnea or paroxysmal nocturnal dyspnea.  Recently had upper respiratory infection around Christmas time completed course of antibiotic therapy and his gradually improved. Occasionally notes bilateral lower extremity edema when she is seated for extended durations. Consistently taking her medications   Past Medical History:  Diagnosis Date   Abnormal nuclear stress test    Accelerated idioventricular rhythm 10/18/2023   Acute diastolic CHF (congestive heart failure) (HCC) 07/14/2023   AKI (acute kidney injury) 07/23/2023   Alcohol abuse 05/24/2014   Alcohol abuse with intoxication 05/24/2014   Alcohol intoxication 05/2014   ED note    Anxiety    Arthritis    Boils    CAD (coronary artery disease) 10/18/2023   Cardiogenic shock (HCC) 06/27/2023   Chest pain 07/23/2015   Chronic back pain    Chronic CHF (congestive heart failure) (HCC) 07/14/2023   Coagulase negative Staphylococcus bacteremia 07/06/2023   Community acquired pneumonia 01/05/2016   Depression    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   Dysuria 08/24/2023   Elevated blood-pressure reading without diagnosis of hypertension 09/05/2023   Elevated TSH 09/05/2023   Fibromyalgia  GERD (gastroesophageal reflux disease)    Heart attack (HCC)    Hepatic cyst 01/06/2016   History of  colonic polyps 09/05/2023   History of CVA (cerebrovascular accident)    IMO SNOMED Dx Update Oct 2024     Hyperlipidemia    Hypertension    Hypertriglyceridemia 10/07/2015   Hyponatremia 05/24/2014   Insomnia    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   Left-sided weakness 05/24/2014   Leg cramps    Medication management 08/24/2023   Migraines    with aura   Mini stroke 05/2014   with paralysis for 1 hr   Non-ST elevation (NSTEMI) myocardial infarction (HCC) 07/14/2023   Osteopenia    Palpitations 07/23/2015   Paroxysmal atrial fibrillation (HCC) 07/14/2023   Physical deconditioning 07/14/2023   PONV (postoperative nausea and vomiting)    severe   Postmenopausal bleeding 12/2015   PTSD (post-traumatic stress disorder)    Right arm numbness 05/24/2014   S/P epidural steroid injection    S/P lumbar fusion 12/31/2018   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, passed out, 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 followed up with eye doctor no further issues   Shortness of breath 07/25/2023   Sinus node dysfunction (HCC) 07/11/2023   Spinal stenosis    Strep throat 01/05/2016   Stroke (HCC)    3 strokes, no residual   Syncope and collapse 05/24/2014   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    Past Surgical History:  Procedure Laterality Date   BREAST ENHANCEMENT SURGERY     CARDIAC CATHETERIZATION N/A 08/12/2015   Procedure: Left Heart Cath and Coronary Angiography;  Surgeon: Candyce GORMAN Reek, MD;  Location: [facility] INVASIVE CV LAB;  Service: Cardiovascular;  Laterality: N/A;   COLONOSCOPY     DILATATION & CURETTAGE/HYSTEROSCOPY WITH MYOSURE N/A 02/22/2016   Procedure: DILATATION & CURETTAGE/HYSTEROSCOPY WITH MYOSURE;  Surgeon: Bobie FORBES Cathlyn JAYSON Nikki, MD;  Location: [facility] ORS;  Service: Gynecology;  Laterality: N/A;   FACIAL COSMETIC SURGERY     HAND SURGERY     nerve & tendon repair   LIPOSUCTION     LUMBAR EPIDURAL INJECTION  06/16/2015   RIGHT/LEFT HEART CATH AND  CORONARY ANGIOGRAPHY N/A 06/27/2023   Procedure: RIGHT/LEFT HEART CATH AND CORONARY ANGIOGRAPHY;  Surgeon: Cherrie Toribio SAUNDERS, MD;  Location: [facility] INVASIVE CV LAB;  Service: Cardiovascular;  Laterality: N/A;   SKIN BIOPSY Right 02/22/2016   Procedure: BIOPSY SKIN;  Surgeon: Bobie FORBES Cathlyn JAYSON Nikki, MD;  Location: [facility];  Service: Gynecology;  Laterality: Right;   SQUAMOUS CELL CARCINOMA EXCISION     TEMPORARY PACEMAKER N/A 07/05/2023   Procedure: TEMPORARY PACEMAKER;  Surgeon: Zenaida Morene PARAS, MD;  Location: [facility] INVASIVE CV LAB;  Service: Cardiovascular;  Laterality: N/A;   VENTRICULAR ASSIST DEVICE INSERTION N/A 06/27/2023   Procedure: VENTRICULAR ASSIST DEVICE INSERTION;  Surgeon: Cherrie Toribio SAUNDERS, MD;  Location: [facility] INVASIVE CV LAB;  Service: Cardiovascular;  Laterality: N/A;   WISDOM TOOTH EXTRACTION      Current Medications: Active Medications[1]   Allergies:   Codeine, Other, Rosuvastatin, Simvastatin, Egg protein (egg white), Evolocumab , Ezetimibe , Hydrocodone-acetaminophen , Peanut (diagnostic), Shellfish allergy, and Statins   Social History   Socioeconomic History   Marital status: Divorced    Spouse name: Not on file   Number of children: Not on file   Years of education: Not on file   Highest education level: Not on file  Occupational History   Not on file  Tobacco Use  Smoking status: Former    Types: Cigarettes   Smokeless tobacco: Never  Vaping Use   Vaping status: Never Used  Substance and Sexual Activity   Alcohol use: No   Drug use: No   Sexual activity: Yes    Partners: Male    Birth control/protection: Post-menopausal    Comment: occ  Other Topics Concern   Not on file  Social History Narrative   Not on file   Social Drivers of Health   Tobacco Use: Medium Risk (01/26/2024)   Patient History    Smoking Tobacco Use: Former    Smokeless Tobacco Use: Never    Passive Exposure: Not on Actuary Strain: Not on file  Food Insecurity: No  Food Insecurity (07/24/2023)   Epic    Worried About Programme Researcher, Broadcasting/film/video in the Last Year: Never true    Ran Out of Food in the Last Year: Never true  Transportation Needs: No Transportation Needs (07/24/2023)   Epic    Lack of Transportation (Medical): No    Lack of Transportation (Non-Medical): No  Physical Activity: Not on file  Stress: Not on file  Social Connections: Socially Integrated (07/24/2023)   Social Connection and Isolation Panel    Frequency of Communication with Friends and Family: Three times a week    Frequency of Social Gatherings with Friends and Family: Twice a week    Attends Religious Services: More than 4 times per year    Active Member of [employer] or Organizations: Yes    Attends Engineer, Structural: More than 4 times per year    Marital Status: Married  Depression (EYV7-0): Not on file  Alcohol Screen: Not on file  Housing: Low Risk (07/24/2023)   Epic    Unable to Pay for Housing in the Last Year: No    Number of Times Moved in the Last Year: 0    Homeless in the Last Year: No  Utilities: Not At Risk (07/24/2023)   Epic    Threatened with loss of utilities: No  Health Literacy: Not on file     Family History: The patient's family history includes CAD in her brother and brother; Cholesteatoma in her sister; Depression in her father; Heart attack in her brother, brother, maternal grandmother, maternal uncle, maternal uncle, maternal uncle, maternal uncle, and maternal uncle; Heart disease in her brother; Hyperlipidemia in her sister; Hypertension in her brother; Non-Hodgkin's lymphoma in her brother; Other in her brother; Stroke in her father and mother. ROS:   Please see the history of present illness.    All 14 point review of systems negative except as described per history of present illness.  EKGs/Labs/Other Studies Reviewed:    The following studies were reviewed today:   EKG:       Recent Labs: 07/23/2023: B Natriuretic Peptide 137.0 07/25/2023:  ALT 13; BUN 9; Creatinine, Ser 0.77; Hemoglobin 11.3; Magnesium  2.0; Platelets 186; Potassium 4.0; Sodium 134  Recent Lipid Panel    Component Value Date/Time   CHOL 235 (H) 05/25/2014 0535   TRIG 352 (H) 05/25/2014 0535   HDL 38 (L) 05/25/2014 0535   CHOLHDL 6.2 05/25/2014 0535   VLDL 70 (H) 05/25/2014 0535   LDLCALC 127 (H) 05/25/2014 0535    Physical Exam:    VS:  BP 112/68   Pulse 82   Ht 5' 2 (1.575 m)   Wt 144 lb 8 oz (65.5 kg)   LMP 07/18/2011   SpO2 98%  BMI 26.43 kg/m     Wt Readings from Last 3 Encounters:  01/26/24 144 lb 8 oz (65.5 kg)  12/01/23 150 lb 6.4 oz (68.2 kg)  10/18/23 152 lb 9.6 oz (69.2 kg)     GENERAL:  Well nourished, well developed in no acute distress NECK: No JVD; No carotid bruits CARDIAC: RRR, S1 and S2 present, no murmurs, no rubs, no gallops CHEST:  Clear to auscultation without rales, wheezing or rhonchi  Extremities: No pitting pedal edema. Pulses bilaterally symmetric with radial 2+ and dorsalis pedis 2+ NEUROLOGIC:  Alert and oriented x 3  Medication Adjustments/Labs and Tests Ordered: Current medicines are reviewed at length with the patient today.  Concerns regarding medicines are outlined above.  No orders of the defined types were placed in this encounter.  Meds ordered this encounter  Medications   torsemide  (DEMADEX ) 20 MG tablet    Sig: Take 1 tablet (20 mg total) by mouth every other day.    Dispense:  45 tablet    Refill:  3    Signed, Leighanne Adolph reddy Azar South, MD, MPH, [facility]. 01/26/2024 3:04 PM    [facility]    [1]  Current Meds  Medication Sig   albuterol  (VENTOLIN  HFA) 108 (90 Base) MCG/ACT inhaler Inhale 2 puffs into the lungs every 4 (four) hours as needed for wheezing or shortness of breath.   ALPRAZolam  (XANAX ) 0.25 MG tablet Take 1-2 tablets (0.25-0.5 mg total) by mouth daily as needed for anxiety.   apixaban  (ELIQUIS ) 5 MG TABS tablet Take 1 tablet (5 mg total) by mouth 2 (two)  times daily.   aspirin  EC 81 MG tablet Take 1 tablet (81 mg total) by mouth daily. Swallow whole.   Bempedoic Acid  (NEXLETOL ) 180 MG TABS Take 1 tablet (180 mg total) by mouth daily.   clotrimazole-betamethasone (LOTRISONE) cream Apply 1 Application topically 2 (two) times daily.   guaiFENesin (MUCINEX) 600 MG 12 hr tablet Take 600 mg by mouth 2 (two) times daily.   levothyroxine  (SYNTHROID ) 50 MCG tablet Take 50 mcg by mouth daily.   magnesium  oxide (MAG-OX) 400 MG tablet Take 400 mg by mouth daily.   pantoprazole  (PROTONIX ) 40 MG tablet Take 1 tablet (40 mg total) by mouth daily.   Spacer/Aero-Holding Chambers (COMPACT SPACE CHAMBER) DEVI Use with the albuterol  inhaler   spironolactone  (ALDACTONE ) 25 MG tablet Take 1 tablet (25 mg total) by mouth daily.   zolpidem  (AMBIEN ) 5 MG tablet TAKE 1 TABLET BY MOUTH AT BEDTIME AS NEEDED FOR SLEEP   [DISCONTINUED] torsemide  (DEMADEX ) 20 MG tablet Take 1 tablet (20 mg total) by mouth daily.   "

## 2024-01-26 NOTE — Patient Instructions (Signed)
 Medication Instructions:  Your physician has recommended you make the following change in your medication:  Reduce Torsemide  to 20 mg every other day   *If you need a refill on your cardiac medications before your next appointment, please call your pharmacy*   Lab Work: None ordered If you have labs (blood work) drawn today and your tests are completely normal, you will receive your results only by: MyChart Message (if you have MyChart) OR A paper copy in the mail If you have any lab test that is abnormal or we need to change your treatment, we will call you to review the results.   Testing/Procedures: None ordered   Follow-Up: At [facility], you and your health needs are our priority.  As part of our continuing mission to provide you with exceptional heart care, we have created designated Provider Care Teams.  These Care Teams include your primary Cardiologist (physician) and Advanced Practice Providers (APPs -  Physician Assistants and Nurse Practitioners) who all work together to provide you with the care you need, when you need it.  We recommend signing up for the patient portal called MyChart.  Sign up information is provided on this After Visit Summary.  MyChart is used to connect with patients for Virtual Visits (Telemedicine).  Patients are able to view lab/test results, encounter notes, upcoming appointments, etc.  Non-urgent messages can be sent to your provider as well.   To learn more about what you can do with MyChart, go to [email or web address].    Your next appointment:   4 week(s)  The format for your next appointment:   In Person  Provider:   Alean Kobus, MD    Other Instructions none  Important Information About Sugar

## 2024-01-26 NOTE — Assessment & Plan Note (Signed)
 Cardiac cath in July 2017 nonobstructive CAD with left dominant circulation and small nondominant RCA. Now s/p NSTEMI, cath 06/27/2023 with nonvisualized RCA.  No obstructive disease in left coronary circulation. Initial RV severe dysfunction on echocardiogram subsequently improved. Normal biventricular function on follow-up echocardiogram 07/23/2023.   Had atypical chest discomfort with normal flat troponins and no significant EKG changes on hospital visit 07/23/2023.[name]  She has been doing well since. At today's follow-up visit mentions functional status is significantly improved.  Continue with aspirin  81 mg once daily Continue Eliquis . Continue Nexletol , tolerating well.

## 2024-01-26 NOTE — Assessment & Plan Note (Signed)
 Transient during her acute inpatient stay in June. No significant abnormalities or AV blocks on follow-up heart monitor studies, most recent December 06 2512-day study.  Has pending follow-up visit with Dr. Kennyth on July 29. She is awaiting final recommendations with regards to release for driving and air travel. I anticipate she would be cleared for driving and travel at follow-up visit.

## 2024-01-26 NOTE — Assessment & Plan Note (Signed)
 Diagnosed during inpatient stay June 2025. CHA2DS2-VASc score elevated.  No significant A-fib burden on 14-day Zio patch from 12/04/2023.  Continue Eliquis  5 mg twice daily.

## 2024-02-15 ENCOUNTER — Ambulatory Visit: Admitting: Cardiology

## 2024-02-29 ENCOUNTER — Ambulatory Visit

## 2024-04-01 ENCOUNTER — Ambulatory Visit: Admitting: Student

## 2024-04-15 ENCOUNTER — Telehealth: Admitting: Psychiatry
